# Patient Record
Sex: Female | Born: 1940 | Race: White | Hispanic: No | Smoking: Never smoker
Health system: Southern US, Community
[De-identification: ages and names within clinical notes are randomized; demographics above are authoritative.]

## PROBLEM LIST (undated history)

## (undated) DIAGNOSIS — N390 Urinary tract infection, site not specified: Secondary | ICD-10-CM

## (undated) DIAGNOSIS — E079 Disorder of thyroid, unspecified: Secondary | ICD-10-CM

## (undated) DIAGNOSIS — I10 Essential (primary) hypertension: Secondary | ICD-10-CM

## (undated) DIAGNOSIS — G4730 Sleep apnea, unspecified: Secondary | ICD-10-CM

## (undated) DIAGNOSIS — E119 Type 2 diabetes mellitus without complications: Secondary | ICD-10-CM

## (undated) DIAGNOSIS — K219 Gastro-esophageal reflux disease without esophagitis: Secondary | ICD-10-CM

## (undated) DIAGNOSIS — F329 Major depressive disorder, single episode, unspecified: Secondary | ICD-10-CM

## (undated) DIAGNOSIS — M1990 Unspecified osteoarthritis, unspecified site: Secondary | ICD-10-CM

## (undated) DIAGNOSIS — E785 Hyperlipidemia, unspecified: Secondary | ICD-10-CM

## (undated) DIAGNOSIS — B019 Varicella without complication: Secondary | ICD-10-CM

## (undated) DIAGNOSIS — F32A Depression, unspecified: Secondary | ICD-10-CM

## (undated) DIAGNOSIS — I639 Cerebral infarction, unspecified: Secondary | ICD-10-CM

## (undated) DIAGNOSIS — H269 Unspecified cataract: Secondary | ICD-10-CM

## (undated) DIAGNOSIS — G709 Myoneural disorder, unspecified: Secondary | ICD-10-CM

## (undated) DIAGNOSIS — H409 Unspecified glaucoma: Secondary | ICD-10-CM

## (undated) HISTORY — DX: Unspecified osteoarthritis, unspecified site: M19.90

## (undated) HISTORY — DX: Depression, unspecified: F32.A

## (undated) HISTORY — PX: BREAST SURGERY: SHX581

## (undated) HISTORY — DX: Gastro-esophageal reflux disease without esophagitis: K21.9

## (undated) HISTORY — DX: Urinary tract infection, site not specified: N39.0

## (undated) HISTORY — PX: APPENDECTOMY: SHX54

## (undated) HISTORY — DX: Myoneural disorder, unspecified: G70.9

## (undated) HISTORY — DX: Sleep apnea, unspecified: G47.30

## (undated) HISTORY — DX: Essential (primary) hypertension: I10

## (undated) HISTORY — DX: Varicella without complication: B01.9

## (undated) HISTORY — DX: Hyperlipidemia, unspecified: E78.5

## (undated) HISTORY — DX: Cerebral infarction, unspecified: I63.9

## (undated) HISTORY — DX: Type 2 diabetes mellitus without complications: E11.9

## (undated) HISTORY — DX: Major depressive disorder, single episode, unspecified: F32.9

## (undated) HISTORY — DX: Unspecified cataract: H26.9

## (undated) HISTORY — DX: Disorder of thyroid, unspecified: E07.9

## (undated) HISTORY — PX: ABDOMINAL HYSTERECTOMY: SHX81

## (undated) HISTORY — PX: TUBAL LIGATION: SHX77

## (undated) HISTORY — DX: Unspecified glaucoma: H40.9

---

## 1997-10-16 ENCOUNTER — Other Ambulatory Visit: Admission: RE | Admit: 1997-10-16 | Discharge: 1997-10-16 | Payer: Self-pay | Admitting: Internal Medicine

## 1998-12-29 ENCOUNTER — Encounter: Payer: Self-pay | Admitting: Gastroenterology

## 1998-12-29 ENCOUNTER — Ambulatory Visit (HOSPITAL_COMMUNITY): Admission: RE | Admit: 1998-12-29 | Discharge: 1998-12-29 | Payer: Self-pay | Admitting: Gastroenterology

## 1999-01-20 ENCOUNTER — Encounter: Payer: Self-pay | Admitting: Gastroenterology

## 1999-01-20 ENCOUNTER — Ambulatory Visit (HOSPITAL_COMMUNITY): Admission: RE | Admit: 1999-01-20 | Discharge: 1999-01-20 | Payer: Self-pay | Admitting: Gastroenterology

## 1999-05-18 ENCOUNTER — Encounter: Payer: Self-pay | Admitting: Internal Medicine

## 1999-05-18 ENCOUNTER — Ambulatory Visit (HOSPITAL_COMMUNITY): Admission: RE | Admit: 1999-05-18 | Discharge: 1999-05-18 | Payer: Self-pay | Admitting: Internal Medicine

## 2000-02-22 ENCOUNTER — Encounter (INDEPENDENT_AMBULATORY_CARE_PROVIDER_SITE_OTHER): Payer: Self-pay | Admitting: Specialist

## 2000-02-22 ENCOUNTER — Ambulatory Visit (HOSPITAL_BASED_OUTPATIENT_CLINIC_OR_DEPARTMENT_OTHER): Admission: RE | Admit: 2000-02-22 | Discharge: 2000-02-22 | Payer: Self-pay | Admitting: Otolaryngology

## 2000-07-18 ENCOUNTER — Ambulatory Visit (HOSPITAL_COMMUNITY): Admission: RE | Admit: 2000-07-18 | Discharge: 2000-07-18 | Payer: Self-pay | Admitting: Internal Medicine

## 2000-07-18 ENCOUNTER — Encounter: Payer: Self-pay | Admitting: Internal Medicine

## 2001-07-20 ENCOUNTER — Encounter: Payer: Self-pay | Admitting: Internal Medicine

## 2001-07-20 ENCOUNTER — Ambulatory Visit (HOSPITAL_COMMUNITY): Admission: RE | Admit: 2001-07-20 | Discharge: 2001-07-20 | Payer: Self-pay | Admitting: Internal Medicine

## 2004-06-03 ENCOUNTER — Other Ambulatory Visit: Admission: RE | Admit: 2004-06-03 | Discharge: 2004-06-03 | Payer: Self-pay | Admitting: Family Medicine

## 2007-04-17 ENCOUNTER — Encounter: Admission: RE | Admit: 2007-04-17 | Discharge: 2007-04-17 | Payer: Self-pay | Admitting: Family Medicine

## 2007-04-30 ENCOUNTER — Ambulatory Visit: Payer: Self-pay | Admitting: Gastroenterology

## 2007-05-01 ENCOUNTER — Encounter: Admission: RE | Admit: 2007-05-01 | Discharge: 2007-05-01 | Payer: Self-pay | Admitting: Anesthesiology

## 2007-05-07 ENCOUNTER — Encounter: Payer: Self-pay | Admitting: Gastroenterology

## 2007-05-07 ENCOUNTER — Ambulatory Visit: Payer: Self-pay | Admitting: Gastroenterology

## 2007-06-15 ENCOUNTER — Ambulatory Visit (HOSPITAL_COMMUNITY): Admission: RE | Admit: 2007-06-15 | Discharge: 2007-06-15 | Payer: Self-pay | Admitting: Neurological Surgery

## 2007-10-26 ENCOUNTER — Ambulatory Visit (HOSPITAL_COMMUNITY): Admission: RE | Admit: 2007-10-26 | Discharge: 2007-10-26 | Payer: Self-pay | Admitting: Neurological Surgery

## 2009-03-28 DIAGNOSIS — E039 Hypothyroidism, unspecified: Secondary | ICD-10-CM | POA: Insufficient documentation

## 2009-04-24 ENCOUNTER — Encounter (INDEPENDENT_AMBULATORY_CARE_PROVIDER_SITE_OTHER): Payer: Self-pay | Admitting: *Deleted

## 2009-12-07 ENCOUNTER — Telehealth: Payer: Self-pay | Admitting: Gastroenterology

## 2010-04-27 NOTE — Letter (Signed)
Summary: Colonoscopy Letter  [facility]  [address] [place], [place] [address]   Phone: [phone]  Fax: [phone]      April 24, 2009 MRN: [id number]   SAFIRE GORDIN [address] [place], [place]  [address]   Dear [name],   According to your medical record, it is time for you to schedule a Colonoscopy. The American Cancer Society recommends this procedure as a method to detect early colon cancer. Patients with a family history of colon cancer, or a personal history of colon polyps or inflammatory bowel disease are at increased risk.  This letter has beeen generated based on the recommendations made at the time of your procedure. If you feel that in your particular situation this may no longer apply, please contact our office.  Please call our office at [phone] to schedule this appointment or to update your records at your earliest convenience.  Thank you for cooperating with [place] to provide you with the very best care possible.   Sincerely,  Barbette Hair. Arlyce Dice, M.D.  [facility] Gastroenterology Division [phone]

## 2010-04-27 NOTE — Progress Notes (Signed)
Summary: Schedule Colonoscopy   Phone Note Outgoing Call Call back at [facility] Phone [phone]   Call placed by: Harlow Mares CMA [facility]),  December 07, 2009 9:54 AM Call placed to: Patient Summary of Call: Patients number is disconnected, we will mail them a letter to remind them they are due for their procedure and they need to call back and schedule.   Initial call taken by: Harlow Mares CMA [facility]),  December 07, 2009 9:54 AM

## 2010-08-10 NOTE — Op Note (Signed)
NAME:  Alexa Brown, Alexa Brown NO.:  [id number]   MEDICAL RECORD NO.:  [id number]          PATIENT TYPE:  AMB   LOCATION:  SDS                          FACILITY:  [facility]   PHYSICIAN:  Tia Alert, MD     DATE OF BIRTH:  Jul 21, 1940   DATE OF PROCEDURE:  06/15/2007  DATE OF DISCHARGE:                               OPERATIVE REPORT   PREOPERATIVE DIAGNOSIS:  Right carpal tunnel syndrome.   POSTOPERATIVE DIAGNOSIS:  Right carpal tunnel syndrome.   PROCEDURE:  Right carpal tunnel release.   SURGEON:  Marikay Alar, M.D.   ANESTHESIA:  Local MAC.   COMPLICATIONS:  None apparent.   INDICATIONS FOR PROCEDURE:  Ms. Davern is a 70 year old white  female who presented with numbness and tingling in the hands in a median  nerve distribution.  She claims some weakness in the hand.  She had  nerve conduction studies suggestive of severe bilateral median  neuropathies, recommended bilateral carpal tunnel release in a staged  fashion.  She presents today for the right-sided decompression followed  in several weeks by a left-sided decompression.  She understands the  risks, benefits, and expected outcome and wishes to proceed.   DESCRIPTION OF PROCEDURE:  The patient was taken to the operating room  and placed in the supine position with the right arm extended on an arm  board.  It was prepped circumferentially from the fingertips to the  axilla with DuraPrep and then draped in the usual sterile fashion.  9 mL  of local anesthesia was injected and a small palmar incision was made  from the distal wrist crease into the palm in line with the middle  finger.  I dissected down through the palmar fat and fascia to identify  the transverse carpal ligament.  I dissected through this with a 15  blade scalpel until the underlying median nerve was identified.  I then  spread between the transverse carpal ligament and the median nerve with  a mosquito and transected the transverse  carpal ligament proximally  under the distal wrist crease into the wrist until it was completely  transected proximally in the arm.  I then palpated with the mosquito to  assure adequate decompression and inspected the nerve.  The nerve looked  healthy and free.  I then again dissected between the nerve and the  transverse carpal ligament distally into the hand and transected the  transverse carpal ligament distally into the palm until it was  completely transected.  The underlying median nerve was now free and the  palmar fat was identified.  I then palpated both proximally and distally  with a mosquito to assure adequate decompression of the median nerve.  I  inspected the nerve.  It looked healthy and free.  I then irrigated with  saline solution, dried all bleeding points.  I then closed the  subcutaneous tissues with interrupted 3-0 Vicryl and then  closed the skin with interrupted 4-0 Ethilon vertical mattress sutures.  The hand was then wrapped in a Kerlix and Ace bandage.  The patient was  taken to recovery room in stable condition.  At the  end of the procedure  all sponge, needle and instrument counts were correct.      Tia Alert, MD  Electronically Signed     DSJ/MEDQ  D:  06/15/2007  T:  06/15/2007  Job:  [id number]

## 2010-08-10 NOTE — Letter (Signed)
April 30, 2007     RE:  NELIAH, CUYLER  MRN:  [id number]  /  DOB:  1941/03/04   Dear [name]:   It is my pleasure to have treated you recently as a new patient in my  office.  I appreciate your confidence and the opportunity to participate  in your care.   Since I do have a busy inpatient endoscopy schedule and office schedule,  my office hours vary weekly.  I am, however, available for emergency  calls every day through my office.  If I cannot promptly meet an urgent  office appointment, another one of our gastroenterologists will be able  to assist you.   My well-trained staff are prepared to help you at all times.  For  emergencies after office hours, a physician from our gastroenterology  section is always available through my 24-hour answering service.   While you are under my care, I encourage discussion of your questions  and concerns, and I will be happy to return your calls as soon as I am  available.   Once again, I welcome you as a new patient and I look forward to a happy  and healthy relationship.    Sincerely,      Barbette Hair. Arlyce Dice, MD,FACG  Electronically Signed    RDK/MedQ  DD: 04/30/2007  DT: 04/30/2007  Job #: [id number]

## 2010-08-10 NOTE — Op Note (Signed)
NAME:  Alexa Brown, Alexa Brown        ACCOUNT NO.:  [id number]   MEDICAL RECORD NO.:  [id number]          PATIENT TYPE:  AMB   LOCATION:  SDS                          FACILITY:  [facility]   PHYSICIAN:  Tia Alert, MD     DATE OF BIRTH:  12/18/1940   DATE OF PROCEDURE:  10/26/2007  DATE OF DISCHARGE:  10/26/2007                               OPERATIVE REPORT   PREOPERATIVE DIAGNOSIS:  Left carpal tunnel syndrome.   POSTOPERATIVE DIAGNOSIS:  Left carpal tunnel syndrome.   PROCEDURE:  Left carpal tunnel release.   SURGEON:  Tia Alert, MD.   ANESTHESIA:  Local MAC.   COMPLICATIONS:  None apparent.   INDICATIONS FOR PROCEDURE:  Ms. [name] is a 70 year old female who had  undergone a right carpal tunnel release about 4 months ago.  She  presented for left carpal tunnel release today in the hopes of improving  her symptoms of median neuropathy confirmed with nerve conduction  studies.  She understood the risks, benefits, expected outcome, and  wished to proceed.   DESCRIPTION OF PROCEDURE:  The patient was taken to operating room and  placed in supine position with a left arm extended on an arm board.  It  was prepped circumferentially with DuraPrep from the axilla down to the  fingertips.  Local anesthesia, 7 mL, was injected and a small palmar  incision was made just distal to the distal wrist crease into the palm  in line with the middle finger.  I dissected down through the palmar  fascia, identified the transverse carpal ligament, opened this with a 15-  blade scalpel to expose the underlying median nerve and then spread  between the transverse carpal ligament and the median nerve with a  hemostat and completely transected the transverse carpal ligament  proximally into the arm under the wrist crease until it was completely  transected.  I then palpated with a curved hemostat and inspected the  nerve, it was well decompressed.  I did the same thing distally into the  palm  spreading between the ligament and the nerve transecting the  ligament distally into the palm until the palmar fat was identified and  the transverse carpal ligament was completely transected.  I then  irrigated with saline solution, inspected the nerve, it was free, and  then I closed the palmar fascia with a single 3-0 Vicryl suture, closed  the subcuticular tissue with 0 Vicryl suture and closed the skin with  interrupted 4-0 Ethilon vertical mattress sutures.  The hand was then  wrapped in a Kerlix and Ace bandage and the patient was taken to  recovery room in stable condition.  At the end of procedure, all sponge,  needle, and instrument counts were correct.      Tia Alert, MD  Electronically Signed    DSJ/MEDQ  D:  10/26/2007  T:  10/27/2007  Job:  [id number]

## 2010-08-10 NOTE — Letter (Signed)
April 30, 2007    Holley Bouche, M.D.  [address]  [place], [place] [address]   RE:  JOSIE, BURLEIGH  MRN:  [id number]  /  DOB:  [birth year]   Dear Dr. Tiburcio Pea:   Upon your kind referral, I had the pleasure of evaluating your patient  and I am pleased to offer my findings.  I saw [name] in the  office today.  Enclosed is a copy of my progress note that details my  findings and recommendations.   Thank you for the opportunity to participate in your patient's care.    Sincerely,      Barbette Hair. Arlyce Dice, MD,FACG  Electronically Signed    RDK/MedQ  DD: 04/30/2007  DT: 04/30/2007  Job #: [id number]

## 2010-08-10 NOTE — Assessment & Plan Note (Signed)
[facility]                         GASTROENTEROLOGY OFFICE NOTE   Alexa Brown, Alexa Brown                 MRN:          [id number]  DATE:04/30/2007                            DOB:          12-22-1940    REASON FOR CONSULTATION:  Colorectal cancer screening.   Ms. Alexa Brown is a pleasant 70 year old white female referred  through the courtesy of Dr. Tiburcio Pea for evaluation.  Her main GI  complaint is some leakage following a bowel movement.  She is without  rectal or abdominal pain.  There is no history of melena or  hematochezia.  Bowels tend to be erratic, characterized by both episodes  of constipation and diarrhea.  She has occasional pyrosis which is well  controlled with Nexium.   PAST MEDICAL HISTORY:  1. Pertinent for hypertension.  2. She had a CVA in 1994.  3. She is under treatment for depression.  4. She is status post hysterectomy.   FAMILY HISTORY:  Pertinent for a father with pancreatic cancer.   MEDICATIONS:  Lotensin, Plavix, Lipitor, Nexium, Zoloft, Wellbutrin,  Allegra and lysine.   She has no allergies.  She neither smokes or drinks.  She is married and  works as a Copywriter, advertising.   REVIEW OF SYSTEMS:  Is positive for fatigue.   PHYSICAL EXAMINATION:  Pulse 78, blood pressure 110/66, weight 176.  HEENT:  EOMI.  PERRLA.  Sclerae are anicteric.  Conjunctivae are pink.  NECK:  Supple without thyromegaly, adenopathy or carotid bruits.  CHEST:  Clear to auscultation and percussion without adventitious  sounds.  CARDIAC:  Regular rhythm; normal S1 S2.  There are no murmurs, gallops  or rubs.  ABDOMEN:  Bowel sounds are normoactive.  Abdomen is soft, nontender and  nondistended.  There are no abdominal masses, tenderness, splenic  enlargement or hepatomegaly.  EXTREMITIES:  Full range of motion.  No cyanosis, clubbing or edema.  RECTAL:  Stool is Hemoccult negative.   IMPRESSION:  1. Incontinence.  This could be due  to hemorrhoids versus relations to      loose stools and irritable bowel syndrome.  2. Status post cerebrovascular accident - on Plavix.  3. Gastroesophageal reflux disease.   RECOMMENDATION:  1. Colonoscopy.  I will hold her Plavix for 7 days prior to this.  2. Fiber supplementation.     Barbette Hair. Arlyce Dice, MD,FACG  Electronically Signed    RDK/MedQ  DD: 04/30/2007  DT: 04/30/2007  Job #: [id number]   cc:   Holley Bouche, M.D.

## 2010-08-13 NOTE — Op Note (Signed)
[place]  Patient:    Alexa Brown, Alexa Brown                 MRN: [id number] Proc. Date: 02/22/00 Adm. Date:  [id number] Attending:  Susy Frizzle CC:         Merlene Laughter. Renae Gloss, M.D.   Operative Report  PREOPERATIVE DIAGNOSES:  Chronic ethmoid sinusitis, chronic maxillary sinusitis, chronic frontal sinusitis.  POSTOPERATIVE DIAGNOSES:  Chronic ethmoid sinusitis, chronic maxillary sinusitis, chronic frontal sinusitis.  PROCEDURES: 1. Bilateral endoscopic anterior ethmoidectomy. 2. Bilateral endoscopic maxillary antrostomy. 3. Bilateral endoscopic frontal sinusotomy.  SURGEON:  Jefry H. Pollyann Kennedy, M.D.  ANESTHESIA:  General endotracheal anesthesia.  COMPLICATIONS:  None.  ESTIMATED BLOOD LOSS:  100 cc.  FINDINGS:  Diffuse polypoid disease throughout the anterior ethmoid cells bilaterally and the frontal recess area bilaterally.   Severe narrowing of the infundibular areas bilaterally secondary to mucosal congestion and anatomic constrictions on the drainage pathways.  Thick, inspissated mucus material filling the right maxillary sinus.  REFERRING PHYSICIAN:  Merlene Laughter. Renae Gloss, M.D.  The patient tolerated the procedure well, was awakened, extubated and transferred to recovery in stable condition.  HISTORY:  This is a 70 year old lady with a recent history of chronic sinus infections.  She has been treated on multiple occasions with longstanding antibiotics for the past several months, which have not completely cleared her.  She continues to have acute exacerbations as well.  Risks, benefits, alternatives and complications of the procedure were explained to patient, who seemed to understand and agreed to surgery.  PROCEDURE:  The patient was taken to the operating room, placed on the operating table in supine position.  Following induction of general endotracheal anesthesia, the patient was draped in a standard fashion for sinus  surgery.  Oxymetazoline spray was used in the nose first sprayed and then on pledgets.  Xylocaine 1% with epinephrine was infiltrated into the superior and posterior attachments of the middle turbinates and the lateral nasal wall area.  1. Bilateral endoscopic ethmoidectomy.  The uncinate process was taken down using sickle knife.  This exposed nicely the bulla ethmoidalis and the infundibular area.  The bulla was taken down, as were the anterior cells.  A complete anterior ethmoidectomy was performed using the microdebrider.  The lateral limit was the lamina papyracea, which was kept intact bilaterally. The medial limit was the ground lamella of the middle turbinate, which was also kept intact and stable.  Posterior limit was the ground lamella as well. The same procedure was performed bilaterally.  There were multiple sinus cells that were filled with polypoid tissue and thick inspissated mucus.  2. Bilateral maxillary antrostomy.  The natural ostium of the maxillary sinus was identified bilaterally.  There was a large fontanelle bilaterally that was opened using the curved suction and microdebrider.  On the right side, a large amount of thick, inspissated mucus was aspirated and sent for culture.  The left side was relatively clear.  The natural ostium was enlarged anteriorly to the lacrimal bone, inferiorly to the attachment of the inferior turbinate and posteriorly to the confluence with the ethmoid lamina papyracea.  Large openings were created bilaterally.  3. Bilateral endoscopic frontal sinusotomy.  After having completed the ethmoidectomy, the agger nasi cells, which were taken down as well directed into the frontal sinus drainage pathway.  There were polypoid mucosa on both sides that were taken down with ______ forceps.  Continued dissection up anteriorly into the frontal sinus was accomplished bilaterally and  tissue was removed from the inferior aspect of the frontal sinuses  bilaterally.  The sinus cavities were irrigated with saline solution and Afrin-soaked pledgets were used to pack the ethmoid cavities temporally bilaterally.  The packs were then removed in the recovery room.  Visual checks were done repeated during the procedure.  There was no evidence of any pupillary defect or any increased intraocular pressure.  The patient tolerated the procedure, was transferred to recovery in stable condition after awaking from anesthesia. DD:  02/22/00 TD:  02/22/00 Job: [id number]

## 2010-09-08 LAB — BASIC METABOLIC PANEL
BUN: 18 mg/dL (ref 4–21)
CREATININE: 1.1 mg/dL (ref 0.5–1.1)
GLUCOSE: 111 mg/dL
POTASSIUM: 4.1 mmol/L (ref 3.4–5.3)
Sodium: 142 mmol/L (ref 137–147)

## 2010-09-08 LAB — HEPATIC FUNCTION PANEL
ALT: 39 U/L — AB (ref 7–35)
AST: 27 U/L (ref 13–35)
Alkaline Phosphatase: 68 U/L (ref 25–125)
Bilirubin, Total: 0.3 mg/dL

## 2010-09-08 LAB — TSH: TSH: 1.76 u[IU]/mL (ref 0.41–5.90)

## 2010-09-08 LAB — HEMOGLOBIN A1C: Hemoglobin A1C: 6.5

## 2010-12-20 LAB — BASIC METABOLIC PANEL
Chloride: 104
Creatinine, Ser: 1.07
GFR calc Af Amer: >60
GFR calc non Af Amer: 52 — ABNORMAL LOW
Potassium: 4.5

## 2010-12-20 LAB — APTT: aPTT: 28

## 2010-12-20 LAB — DIFFERENTIAL
Eosinophils Absolute: 0.1
Lymphocytes Relative: 23
Lymphs Abs: 1.4
Monocytes Relative: 9
Neutrophils Relative %: 66

## 2010-12-20 LAB — CBC
HCT: 42.6
MCV: 84.9
RBC: 5.01
WBC: 5.9

## 2010-12-20 LAB — PROTIME-INR: INR: 0.8

## 2010-12-24 LAB — DIFFERENTIAL
Eosinophils Relative: 3
Lymphocytes Relative: 27
Lymphs Abs: 1.6
Monocytes Absolute: 0.5
Monocytes Relative: 8
Neutro Abs: 3.7

## 2010-12-24 LAB — CBC
HCT: 38.6
Hemoglobin: 13.4
RBC: 4.44
WBC: 6.1

## 2010-12-24 LAB — APTT: aPTT: 26

## 2010-12-24 LAB — BASIC METABOLIC PANEL
Calcium: 9.7
GFR calc Af Amer: >60
GFR calc non Af Amer: >60
Glucose, Bld: 171 — ABNORMAL HIGH
Potassium: 3.6
Sodium: 138

## 2011-03-04 LAB — CBC AND DIFFERENTIAL
HCT: 40 % (ref 36–46)
HEMOGLOBIN: 13.3 g/dL (ref 12.0–16.0)
Platelets: 256 10*3/uL (ref 150–399)
WBC: 5.6 10^3/mL

## 2011-03-04 LAB — LIPID PANEL
CHOLESTEROL: 207 mg/dL — AB (ref 0–200)
HDL: 62 mg/dL (ref 35–70)
LDL Cholesterol: 111 mg/dL
Triglycerides: 170 mg/dL — AB (ref 40–160)

## 2011-03-04 LAB — BASIC METABOLIC PANEL
BUN: 22 mg/dL — AB (ref 4–21)
Creatinine: 1 mg/dL (ref 0.5–1.1)
Glucose: 91 mg/dL
POTASSIUM: 4.2 mmol/L (ref 3.4–5.3)
SODIUM: 139 mmol/L (ref 137–147)

## 2011-03-04 LAB — HEMOGLOBIN A1C: Hemoglobin A1C: 6.2

## 2011-03-04 LAB — HEPATIC FUNCTION PANEL
ALT: 19 U/L (ref 7–35)
AST: 17 U/L (ref 13–35)
Alkaline Phosphatase: 56 U/L (ref 25–125)
Bilirubin, Total: 0.3 mg/dL

## 2011-03-04 LAB — TSH: TSH: 1.61 u[IU]/mL (ref 0.41–5.90)

## 2011-06-27 ENCOUNTER — Encounter: Payer: Self-pay | Admitting: Gastroenterology

## 2011-07-05 LAB — HEMOGLOBIN A1C: Hemoglobin A1C: 6.2

## 2011-08-12 LAB — HEPATIC FUNCTION PANEL
ALT: 20 U/L (ref 7–35)
AST: 19 U/L (ref 13–35)
Alkaline Phosphatase: 54 U/L (ref 25–125)
Bilirubin, Total: 0.4 mg/dL

## 2011-08-12 LAB — BASIC METABOLIC PANEL
BUN: 20 mg/dL (ref 4–21)
Creatinine: 1.1 mg/dL (ref 0.5–1.1)
Glucose: 99 mg/dL
POTASSIUM: 4.4 mmol/L (ref 3.4–5.3)
SODIUM: 142 mmol/L (ref 137–147)

## 2011-08-12 LAB — CBC AND DIFFERENTIAL
HCT: 37 % (ref 36–46)
HEMOGLOBIN: 12.6 g/dL (ref 12.0–16.0)
Platelets: 232 10*3/uL (ref 150–399)
WBC: 4.6 10^3/mL

## 2011-12-13 ENCOUNTER — Encounter: Payer: Self-pay | Admitting: Gastroenterology

## 2012-03-28 HISTORY — PX: BREAST BIOPSY: SHX20

## 2012-05-11 LAB — BASIC METABOLIC PANEL
BUN: 28 mg/dL — AB (ref 4–21)
CREATININE: 1.1 mg/dL (ref 0.5–1.1)
GLUCOSE: 115 mg/dL
POTASSIUM: 4.3 mmol/L (ref 3.4–5.3)
Sodium: 140 mmol/L (ref 137–147)

## 2012-05-11 LAB — HEPATIC FUNCTION PANEL
ALT: 21 U/L (ref 7–35)
AST: 19 U/L (ref 13–35)
Alkaline Phosphatase: 44 U/L (ref 25–125)
BILIRUBIN, TOTAL: 0.5 mg/dL

## 2012-05-11 LAB — LIPID PANEL
Cholesterol: 202 mg/dL — AB (ref 0–200)
HDL: 68 mg/dL (ref 35–70)
LDL Cholesterol: 102 mg/dL
Triglycerides: 160 mg/dL (ref 40–160)

## 2012-05-11 LAB — HEMOGLOBIN A1C: HEMOGLOBIN A1C: 6.1

## 2012-08-02 LAB — BASIC METABOLIC PANEL
BUN: 24 mg/dL — AB (ref 4–21)
CREATININE: 1.3 mg/dL — AB (ref 0.5–1.1)
GLUCOSE: 81 mg/dL
Potassium: 4.1 mmol/L (ref 3.4–5.3)
Sodium: 141 mmol/L (ref 137–147)

## 2012-08-02 LAB — HEPATIC FUNCTION PANEL
ALK PHOS: 47 U/L (ref 25–125)
ALT: 18 U/L (ref 7–35)
AST: 18 U/L (ref 13–35)
Bilirubin, Total: 0.5 mg/dL

## 2012-08-02 LAB — TSH: TSH: 2.71 u[IU]/mL (ref 0.41–5.90)

## 2012-09-03 LAB — BASIC METABOLIC PANEL
BUN: 19 mg/dL (ref 4–21)
Creatinine: 1.2 mg/dL — AB (ref 0.5–1.1)
Glucose: 94 mg/dL
Potassium: 4.4 mmol/L (ref 3.4–5.3)
Sodium: 142 mmol/L (ref 137–147)

## 2012-09-03 LAB — HEPATIC FUNCTION PANEL
ALK PHOS: 60 U/L (ref 25–125)
ALT: 25 U/L (ref 7–35)
AST: 21 U/L (ref 13–35)
BILIRUBIN, TOTAL: 0.5 mg/dL

## 2012-09-03 LAB — CBC AND DIFFERENTIAL
HEMATOCRIT: 40 % (ref 36–46)
HEMOGLOBIN: 14.1 g/dL (ref 12.0–16.0)
PLATELETS: 259 10*3/uL (ref 150–399)
WBC: 5.3 10^3/mL

## 2012-09-03 LAB — HEMOGLOBIN A1C: HEMOGLOBIN A1C: 6

## 2012-11-19 LAB — BASIC METABOLIC PANEL
BUN: 17 mg/dL (ref 4–21)
CREATININE: 1.3 mg/dL — AB (ref 0.5–1.1)
GLUCOSE: 135 mg/dL
POTASSIUM: 4.5 mmol/L (ref 3.4–5.3)
Sodium: 141 mmol/L (ref 137–147)

## 2012-11-19 LAB — HEPATIC FUNCTION PANEL
ALT: 23 U/L (ref 7–35)
AST: 21 U/L (ref 13–35)
Alkaline Phosphatase: 59 U/L (ref 25–125)
Bilirubin, Total: 0.4 mg/dL

## 2013-01-28 DIAGNOSIS — L719 Rosacea, unspecified: Secondary | ICD-10-CM | POA: Insufficient documentation

## 2013-06-11 LAB — TSH: TSH: 1.27 u[IU]/mL (ref 0.41–5.90)

## 2013-06-11 LAB — LIPID PANEL
CHOLESTEROL: 181 mg/dL (ref 0–200)
HDL: 79 mg/dL — AB (ref 35–70)
LDL Cholesterol: 70 mg/dL
Triglycerides: 160 mg/dL (ref 40–160)

## 2013-06-11 LAB — BASIC METABOLIC PANEL
BUN: 20 mg/dL (ref 4–21)
Creatinine: 1.3 mg/dL — AB (ref 0.5–1.1)
Glucose: 124 mg/dL
POTASSIUM: 4.2 mmol/L (ref 3.4–5.3)
SODIUM: 141 mmol/L (ref 137–147)

## 2013-06-11 LAB — HEPATIC FUNCTION PANEL
ALT: 30 U/L (ref 7–35)
AST: 27 U/L (ref 13–35)
Alkaline Phosphatase: 74 U/L (ref 25–125)
Bilirubin, Total: 0.7 mg/dL

## 2013-06-11 LAB — HEMOGLOBIN A1C: HEMOGLOBIN A1C: 7.3

## 2013-06-18 LAB — BASIC METABOLIC PANEL
BUN: 26 mg/dL — AB (ref 4–21)
Creatinine: 1.2 mg/dL — AB (ref 0.5–1.1)
Glucose: 174 mg/dL
Potassium: 4.4 mmol/L (ref 3.4–5.3)
SODIUM: 139 mmol/L (ref 137–147)

## 2013-08-26 LAB — BASIC METABOLIC PANEL
BUN: 17 mg/dL (ref 4–21)
CREATININE: 1.2 mg/dL — AB (ref 0.5–1.1)
GLUCOSE: 105 mg/dL
Potassium: 4.5 mmol/L (ref 3.4–5.3)
Sodium: 143 mmol/L (ref 137–147)

## 2013-08-26 LAB — LIPID PANEL
Cholesterol: 138 mg/dL (ref 0–200)
HDL: 61 mg/dL (ref 35–70)
LDL Cholesterol: 62 mg/dL
TRIGLYCERIDES: 76 mg/dL (ref 40–160)

## 2013-08-26 LAB — HEPATIC FUNCTION PANEL
ALK PHOS: 62 U/L (ref 25–125)
ALT: 29 U/L (ref 7–35)
AST: 24 U/L (ref 13–35)
BILIRUBIN, TOTAL: 0.5 mg/dL

## 2013-08-26 LAB — CBC AND DIFFERENTIAL
HCT: 39 % (ref 36–46)
Hemoglobin: 13.2 g/dL (ref 12.0–16.0)
PLATELETS: 234 10*3/uL (ref 150–399)
WBC: 4 10*3/mL

## 2013-08-26 LAB — TSH: TSH: 1.01 u[IU]/mL (ref 0.41–5.90)

## 2013-08-26 LAB — HEMOGLOBIN A1C: HEMOGLOBIN A1C: 6.4

## 2014-06-11 LAB — BASIC METABOLIC PANEL
BUN: 19 mg/dL (ref 4–21)
Creatinine: 1.1 mg/dL (ref 0.5–1.1)
Glucose: 109 mg/dL
POTASSIUM: 4.3 mmol/L (ref 3.4–5.3)
SODIUM: 141 mmol/L (ref 137–147)

## 2014-06-11 LAB — HEPATIC FUNCTION PANEL
ALK PHOS: 57 U/L (ref 25–125)
ALT: 25 U/L (ref 7–35)
AST: 20 U/L (ref 13–35)
BILIRUBIN, TOTAL: 0.6 mg/dL

## 2014-06-11 LAB — HEMOGLOBIN A1C: Hemoglobin A1C: 6.2

## 2014-10-29 LAB — BASIC METABOLIC PANEL
BUN: 20 mg/dL (ref 4–21)
Creatinine: 1.1 mg/dL (ref 0.5–1.1)
GLUCOSE: 96 mg/dL
Potassium: 4.6 mmol/L (ref 3.4–5.3)
Sodium: 141 mmol/L (ref 137–147)

## 2014-10-29 LAB — HEPATIC FUNCTION PANEL
ALK PHOS: 53 U/L (ref 25–125)
ALT: 23 U/L (ref 7–35)
AST: 20 U/L (ref 13–35)
BILIRUBIN, TOTAL: 0.5 mg/dL

## 2014-10-29 LAB — LIPID PANEL
Cholesterol: 195 mg/dL (ref 0–200)
HDL: 64 mg/dL (ref 35–70)
LDL CALC: 93 mg/dL
TRIGLYCERIDES: 188 mg/dL — AB (ref 40–160)

## 2014-10-29 LAB — HEMOGLOBIN A1C: Hemoglobin A1C: 6.1

## 2015-01-01 LAB — BASIC METABOLIC PANEL
BUN: 25 mg/dL — AB (ref 4–21)
Creatinine: 1 mg/dL (ref 0.5–1.1)
GLUCOSE: 94 mg/dL
Potassium: 3.9 mmol/L (ref 3.4–5.3)
Sodium: 138 mmol/L (ref 137–147)

## 2015-01-01 LAB — TSH: TSH: 1.25 u[IU]/mL (ref 0.41–5.90)

## 2015-01-01 LAB — HEPATIC FUNCTION PANEL
ALT: 24 U/L (ref 7–35)
AST: 23 U/L (ref 13–35)
Alkaline Phosphatase: 53 U/L (ref 25–125)
BILIRUBIN, TOTAL: 0.7 mg/dL

## 2015-01-01 LAB — CBC AND DIFFERENTIAL
HEMATOCRIT: 45 % (ref 36–46)
HEMOGLOBIN: 14.7 g/dL (ref 12.0–16.0)
Platelets: 254 10*3/uL (ref 150–399)
WBC: 12 10*3/mL

## 2015-03-27 LAB — HEPATIC FUNCTION PANEL
ALT: 23 U/L (ref 7–35)
AST: 19 U/L (ref 13–35)
Alkaline Phosphatase: 64 U/L (ref 25–125)
Bilirubin, Total: 0.4 mg/dL

## 2015-03-27 LAB — HEMOGLOBIN A1C: Hemoglobin A1C: 5.9

## 2015-03-27 LAB — BASIC METABOLIC PANEL
BUN: 15 mg/dL (ref 4–21)
CREATININE: 1.1 mg/dL (ref 0.5–1.1)
GLUCOSE: 145 mg/dL
POTASSIUM: 4.2 mmol/L (ref 3.4–5.3)
SODIUM: 142 mmol/L (ref 137–147)

## 2015-04-28 ENCOUNTER — Encounter: Payer: Self-pay | Admitting: Gastroenterology

## 2015-07-27 LAB — CBC AND DIFFERENTIAL
HEMATOCRIT: 43 % (ref 36–46)
Hemoglobin: 14.2 g/dL (ref 12.0–16.0)
Platelets: 235 10*3/uL (ref 150–399)
WBC: 5.2 10^3/mL

## 2015-07-27 LAB — HEPATIC FUNCTION PANEL
ALK PHOS: 51 U/L (ref 25–125)
ALT: 20 U/L (ref 7–35)
AST: 21 U/L (ref 13–35)
BILIRUBIN, TOTAL: 0.5 mg/dL

## 2015-07-27 LAB — TSH: TSH: 1.81 u[IU]/mL (ref 0.41–5.90)

## 2015-07-27 LAB — BASIC METABOLIC PANEL
BUN: 19 mg/dL (ref 4–21)
CREATININE: 1.1 mg/dL (ref 0.5–1.1)
GLUCOSE: 157 mg/dL
Potassium: 3.8 mmol/L (ref 3.4–5.3)
SODIUM: 140 mmol/L (ref 137–147)

## 2015-07-27 LAB — HEMOGLOBIN A1C: Hemoglobin A1C: 5.7

## 2015-12-23 ENCOUNTER — Encounter (HOSPITAL_COMMUNITY): Payer: Self-pay

## 2015-12-23 DIAGNOSIS — S5002XA Contusion of left elbow, initial encounter: Secondary | ICD-10-CM | POA: Insufficient documentation

## 2015-12-23 DIAGNOSIS — S51812A Laceration without foreign body of left forearm, initial encounter: Secondary | ICD-10-CM | POA: Insufficient documentation

## 2015-12-23 DIAGNOSIS — Z23 Encounter for immunization: Secondary | ICD-10-CM | POA: Diagnosis not present

## 2015-12-23 DIAGNOSIS — Y92000 Kitchen of unspecified non-institutional (private) residence as  the place of occurrence of the external cause: Secondary | ICD-10-CM | POA: Insufficient documentation

## 2015-12-23 DIAGNOSIS — W1809XA Striking against other object with subsequent fall, initial encounter: Secondary | ICD-10-CM | POA: Insufficient documentation

## 2015-12-23 DIAGNOSIS — S0181XA Laceration without foreign body of other part of head, initial encounter: Secondary | ICD-10-CM | POA: Insufficient documentation

## 2015-12-23 DIAGNOSIS — Y9301 Activity, walking, marching and hiking: Secondary | ICD-10-CM | POA: Diagnosis not present

## 2015-12-23 DIAGNOSIS — Y999 Unspecified external cause status: Secondary | ICD-10-CM | POA: Insufficient documentation
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
## 2015-12-24 ENCOUNTER — Emergency Department (HOSPITAL_COMMUNITY)
Admission: EM | Admit: 2015-12-24 | Discharge: 2015-12-24 | Disposition: A | Discharge status: Home / Self Care | Payer: Medicare Other | Attending: Emergency Medicine | Admitting: Emergency Medicine

## 2015-12-24 DIAGNOSIS — S51812A Laceration without foreign body of left forearm, initial encounter: Secondary | ICD-10-CM | POA: Diagnosis not present

## 2015-12-24 DIAGNOSIS — IMO0002 Reserved for concepts with insufficient information to code with codable children: Secondary | ICD-10-CM

## 2015-12-24 MED ORDER — LIDOCAINE-EPINEPHRINE 2 %-1:200000 IJ SOLN
20.0000 mL | Freq: Once | INTRAMUSCULAR | Status: AC
Start: 2015-12-24 — End: 2015-12-24
  Administered 2015-12-24: 20 mL
  Filled 2015-12-24: qty 20

## 2015-12-24 MED ORDER — TETANUS-DIPHTH-ACELL PERTUSSIS 5-2.5-18.5 LF-MCG/0.5 IM SUSP
0.5000 mL | Freq: Once | INTRAMUSCULAR | Status: AC
  Administered 2015-12-24: 0.5 mL via INTRAMUSCULAR
  Filled 2015-12-24: qty 0.5
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
## 2015-12-24 NOTE — ED Notes (Signed)
PT DISCHARGED. INSTRUCTIONS GIVEN. AAOX4. PT IN NO APPARENT DISTRESS OR PAIN. THE OPPORTUNITY TO ASK QUESTIONS WAS PROVIDED. 

## 2015-12-24 NOTE — ED Provider Notes (Signed)
Pt under Dr. Plunkett's care, she asked that I suture the lac on her L forearm and dermabond the small linear chin lac. Please see Dr. Plunkett's full note for details of visit and full HPI/ROS/PE.  Physical Exam  BP 127/73   Pulse 98   Temp 98.3 F (36.8 C)   Resp 16   SpO2 91%   Physical Exam Chin with ~1cm linear lac, no ongoing bleeding L forearm with ~6cm linear lac gaped open along ulnar aspect of forearm, no ongoing bleeding, no visualized FBs, mild swelling and bruising to the area. NVI with soft compartments  ED Course  .Marland KitchenLaceration Repair Date/Time: 12/24/2015 12:55 AM Performed by: Zacarias Pontes Authorized by: Zacarias Pontes   Consent:    Consent obtained:  Verbal   Consent given by:  Patient   Risks discussed:  Infection, pain and poor cosmetic result   Alternatives discussed:  No treatment, delayed treatment and observation Anesthesia (see MAR for exact dosages):    Anesthesia method:  Local infiltration   Local anesthetic:  Lidocaine 2% WITH epi Laceration details:    Location:  Shoulder/arm   Shoulder/arm location:  L lower arm   Length (cm):  6 Repair type:    Repair type:  Intermediate Pre-procedure details:    Preparation:  Patient was prepped and draped in usual sterile fashion Exploration:    Hemostasis achieved with:  Epinephrine   Wound exploration: wound explored through full range of motion and entire depth of wound probed and visualized     Contaminated: no   Treatment:    Area cleansed with:  Saline   Amount of cleaning:  Standard   Irrigation solution:  Sterile saline Skin repair:    Repair method:  Sutures   Suture size:  4-0   Suture material:  Prolene   Suture technique:  Horizontal mattress   Number of sutures:  4 (2 horizontal mattress, and 2 simple interrupted) Approximation:    Approximation:  Close   Vermilion border: well-aligned   Post-procedure details:    Dressing:  Antibiotic ointment and non-adherent  dressing   Patient tolerance of procedure:  Tolerated well, no immediate complications .Marland KitchenLaceration Repair Date/Time: 12/24/2015 1:08 AM Performed by: Zacarias Pontes Authorized by: Zacarias Pontes   Consent:    Consent obtained:  Verbal   Consent given by:  Patient   Risks discussed:  Pain, infection and poor cosmetic result   Alternatives discussed:  No treatment, delayed treatment and observation Anesthesia (see MAR for exact dosages):    Anesthesia method:  None Laceration details:    Location:  Face   Face location:  Chin   Length (cm):  1 Repair type:    Repair type:  Simple Pre-procedure details:    Preparation:  Patient was prepped and draped in usual sterile fashion Exploration:    Hemostasis achieved with:  Direct pressure   Wound exploration: entire depth of wound probed and visualized     Contaminated: no   Treatment:    Area cleansed with:  Saline   Amount of cleaning:  Standard   Irrigation solution:  Sterile saline Skin repair:    Repair method:  Tissue adhesive Approximation:    Approximation:  Close   Vermilion border: well-aligned   Post-procedure details:    Dressing:  Open (no dressing)   Patient tolerance of procedure:  Tolerated well, no immediate complications       Rashmi Tallent Camprubi-Soms, PA-C 12/24/15 [id number]    Blanchie Dessert, MD 12/24/15 [phone]

## 2016-01-14 ENCOUNTER — Ambulatory Visit (INDEPENDENT_AMBULATORY_CARE_PROVIDER_SITE_OTHER): Payer: Medicare Other | Admitting: Family Medicine

## 2016-01-14 ENCOUNTER — Encounter: Payer: Self-pay | Admitting: Family Medicine

## 2016-01-14 ENCOUNTER — Encounter (INDEPENDENT_AMBULATORY_CARE_PROVIDER_SITE_OTHER): Payer: Self-pay

## 2016-01-14 VITALS — BP 138/65 | HR 112 | Temp 98.3°F | Resp 12 | Ht 65.5 in | Wt 162.0 lb

## 2016-01-14 DIAGNOSIS — E559 Vitamin D deficiency, unspecified: Secondary | ICD-10-CM | POA: Diagnosis not present

## 2016-01-14 DIAGNOSIS — Z23 Encounter for immunization: Secondary | ICD-10-CM

## 2016-01-14 DIAGNOSIS — I499 Cardiac arrhythmia, unspecified: Secondary | ICD-10-CM

## 2016-01-14 DIAGNOSIS — I10 Essential (primary) hypertension: Secondary | ICD-10-CM

## 2016-01-14 DIAGNOSIS — E1149 Type 2 diabetes mellitus with other diabetic neurological complication: Secondary | ICD-10-CM | POA: Diagnosis not present

## 2016-01-14 DIAGNOSIS — I674 Hypertensive encephalopathy: Secondary | ICD-10-CM | POA: Insufficient documentation

## 2016-01-14 DIAGNOSIS — R296 Repeated falls: Secondary | ICD-10-CM

## 2016-01-14 DIAGNOSIS — G629 Polyneuropathy, unspecified: Secondary | ICD-10-CM | POA: Insufficient documentation

## 2016-01-14 NOTE — Progress Notes (Signed)
HPI:   Alexa Brown is a 75 y.o. female, who is here today with her husband to establish care with me.  Former PCP: Dr Freda Munro. Last preventive routine visit: 11/2014  Concerns today: she needs to follow on some of her chronic medical problems.   DM II  Dx about 1 year ago. BS's 90-110's. She denies any hypoglycemia. She has not noted polyuria or polyphagia. + Polydipsia,thinks it is related to diabetes medication.  No abdominal pain, nausea, or vomiting. She is currently on Invokana but would like to stop it, afraid of side effects, denies having any. She doesn't remember trying a different medication.  Tingling, "pins and needles", and burning feet. She has followed with neurologist, currently on Cymbalta.   Vitamin D deficiency, currently she is on vitamin D 5000 units daily.  She does not follow a healthful diet. Exercise: Not currently due to husband illness.   Hypertension: Currently she is on Benazepril 20 mg and Benazepril-HCTZ 20-12.5 mg 1/2 tab each one. According to patient, her insurance didn't approve Benazepril-Hydrochlorthiazide higher dose.  Reporting no side effects from medication.  She denies any frequent/severe headache, visual chest pain, dyspnea, palpitation, or edema. S/P CVA in 1994 at the time she was Dx with HTN. She is on Plavix 75 mg daily.  Today noted sinus tachycardia and mildly irregular HR, she denies prior history.   GERD Currently she is on Protonix 40 mg twice a day, symptoms are well controlled as far as she takes medication daily.  Denies abdominal pain, changes in bowel habits, blood in stool or melena.  Hx of constipation, she is on Colace.  History of depression, currently she is taking Cymbalta 60 mg 2 tablets daily.  Cymbalta was started by neuro for neuropathy and dose increased about a year ago by PCP for depression. She states that she feels "overdrug." She denies suicidal thoughts. Currently she  is dealing with her husband's illness, who was recently diagnosed with pancreatic cancer and currently undergoing radiation and chemotherapy.   Reporting frequent falls, last one 2 weeks ago. States that her legs gave up and trips frequently, she has not done PT but was discussed with former neurologists during last f/u visit.  She lives with husband. + Unstable gait, she has a cane. Rest ADL's independent.    Review of Systems  Constitutional: Positive for fatigue. Negative for activity change, appetite change, fever and unexpected weight change.  HENT: Negative for mouth sores, nosebleeds and trouble swallowing.   Eyes: Negative for pain and visual disturbance.  Respiratory: Negative for cough, shortness of breath and wheezing.   Cardiovascular: Negative for chest pain, palpitations and leg swelling.  Gastrointestinal: Negative for abdominal pain, nausea and vomiting.       Negative for changes in bowel habits.  Endocrine: Positive for polydipsia. Negative for polyphagia and polyuria.  Genitourinary: Negative for decreased urine volume, difficulty urinating, dysuria and hematuria.  Musculoskeletal: Positive for arthralgias (knees mainly) and gait problem.  Skin: Negative for color change and rash.  Neurological: Positive for numbness. Negative for syncope, weakness and headaches.  Psychiatric/Behavioral: Negative for confusion, hallucinations and suicidal ideas. The patient is nervous/anxious.       No current outpatient prescriptions on file prior to visit.   No current facility-administered medications on file prior to visit.      Past Medical History:  Diagnosis Date  . Chicken pox   . Depression   . Diabetes mellitus without complication ([place])   .  GERD (gastroesophageal reflux disease)   . Hyperlipidemia   . Hypertension   . Stroke ([place])   . UTI (urinary tract infection)    No Known Allergies  Family History  Problem Relation Age of Onset  . Hyperlipidemia  Mother   . Heart disease Mother   . Stroke Mother   . Hypertension Mother   . Hyperlipidemia Father   . Heart disease Father   . Stroke Father   . Hypertension Father     Social History   Social History  . Marital status: Married    Spouse name: N/A  . Number of children: N/A  . Years of education: N/A   Social History Main Topics  . Smoking status: Never Smoker  . Smokeless tobacco: Never Used  . Alcohol use No  . Drug use: No  . Sexual activity: Not Currently   Other Topics Concern  . None   Social History Narrative  . None    Vitals:   01/14/16 1426  BP: 138/65  Pulse: (!) 112  Resp: 12  Temp: 98.3 F (36.8 C)   O2 sat 97% at RA.   Body mass index is 26.55 kg/m.    Physical Exam  Nursing note and vitals reviewed. Constitutional: She is oriented to person, place, and time. She appears well-developed. No distress.  HENT:  Head: Atraumatic.  Mouth/Throat: Oropharynx is clear and moist and mucous membranes are normal.  Eyes: Conjunctivae and EOM are normal. Pupils are equal, round, and reactive to light.  Neck: No JVD present. No tracheal deviation present. No thyroid mass and no thyromegaly present.  Cardiovascular: Normal rate and regular rhythm.   Occasional extrasystoles (x 2-3) are present.  No murmur heard. Pulses:      Dorsalis pedis pulses are 2+ on the right side, and 2+ on the left side.  Respiratory: Effort normal and breath sounds normal. No respiratory distress.  GI: Soft. She exhibits no mass. There is no hepatomegaly. There is no tenderness.  Musculoskeletal: She exhibits no edema.  Neurological: She is alert and oriented to person, place, and time. She has normal strength. Coordination normal.  Stable gait assisted by a cane  Skin: Skin is warm. No erythema.  Psychiatric: Her speech is normal. Her mood appears anxious.  Well groomed, good eye contact.    Diabetic foot exam:  Monofilament abnormal bilateral. Peripheral pulses  present (DP). + calluses + hypertrophic/long toenails.   ASSESSMENT AND PLAN:     Kashish was seen today for establish care.  Diagnoses and all orders for this visit:    Lab Results  Component Value Date   HGBA1C 6.1 01/14/2016   Lab Results  Component Value Date   CREATININE 1.06 01/14/2016   BUN 23 01/14/2016   NA 142 01/14/2016   K 4.0 01/14/2016   CL 106 01/14/2016   CO2 26 01/14/2016     DM (diabetes mellitus), type 2 with neurological complications ([place])  123456 pending. No changes in current management for now but will consider stopping Invokana depending on labs results. She does not recall trying Metformin before, may consider changing treatment if HgA1C not at goal. Regular exercise and healthy diet with avoidance of added sugar food intake is an important part of treatment and recommended. Annual eye exam, periodic dental and foot care recommended. F/U in 5-6 months  -     HgB A1c -     Basic metabolic panel -     Urine Microalbumin w/creat ratio  Irregular  heart rate  Asymptomatic. Tachycardia improved. Adequate hydration. EKG today : NSR,normal axis and intervals, ? LAE. Instructed about warning signs.  -     EKG 12-Lead  Vitamin D deficiency  No changes in current management, will follow labs done today and will give further recommendations accordingly.  -     VITAMIN D 25 Hydroxy (Vit-D Deficiency, Fractures)    Essential hypertension  Adequately controlled. No changes in current management. DASH-low salt diet recommended. Eye exam recommended annually. F/U in 6 months, before if needed.  -     Basic metabolic panel -     EKG [id number]   Peripheral polyneuropathy ([place])  Foot care discussed. Fall precautions. Continue Cymbalta but she will try decreasing dose, alternating 120 and 60 mg every other day.   Frequent falls  Most likely related to neuropathy and OA but could be aggravated by medications as well. PT will be  arranged. Fall precautions discussed.  -     Ambulatory referral to Physical Therapy  Need for immunization against influenza -     Flu vaccine HIGH DOSE PF        Clairissa G. Martinique, MD  [facility]. [place] office.

## 2016-01-14 NOTE — Patient Instructions (Signed)
A few things to remember from today's visit:   Irregular heart rate - Plan: EKG 12-Lead  Vitamin D deficiency - Plan: VITAMIN D 25 Hydroxy (Vit-D Deficiency, Fractures)  DM (diabetes mellitus), type 2 with neurological complications ([place]) - Plan: HgB 123456, Basic metabolic panel, Urine Microalbumin w/creat. ratio  Essential hypertension - Plan: Basic metabolic panel, EKG [id number]  Peripheral polyneuropathy ([facility])  Need for immunization against influenza - Plan: Flu vaccine HIGH DOSE PF  Try decreasing Cymbalta from 120 mg daily to alternating 60 and 120 every other day  Breast no changes until labs are back.  HgA1C goal < 7.0-8.0. Avoid sugar added food:regular soft drinks, energy drinks, and sports drinks. candy. cakes. cookies. pies and cobblers. sweet rolls, pastries, and donuts. fruit drinks, such as fruitades and fruit punch. dairy desserts, such as ice cream  Mediterranean diet has showed benefits for sugar control.  How much and what type of carbohydrate foods are important for managing diabetes. The balance between how much insulin is in your body and the carbohydrate you eat makes a difference in your blood glucose levels.  Fasting blood sugar ideally 130 or less, 2 hours after meals less than 180.   Regular exercise also will help with controlling disease, daily brisk walking as tolerated for 15-30 min definitively will help.   Avoid skipping meals, blood sugar might drop and cause serious problems. Remember checking feet periodically, good dental hygiene, and annual eye exam.     Please be sure medication list is accurate. If a new problem present, please set up appointment sooner than planned today.

## 2016-01-15 ENCOUNTER — Encounter: Payer: Self-pay | Admitting: Family Medicine

## 2016-01-15 LAB — MICROALBUMIN / CREATININE URINE RATIO
Creatinine,U: 44.3 mg/dL
Microalb Creat Ratio: 1.6 mg/g (ref 0.0–30.0)
Microalb, Ur: <0.7 mg/dL (ref 0.0–1.9)

## 2016-01-15 LAB — BASIC METABOLIC PANEL
BUN: 23 mg/dL (ref 6–23)
CALCIUM: 10.5 mg/dL (ref 8.4–10.5)
CO2: 26 mEq/L (ref 19–32)
Chloride: 106 mEq/L (ref 96–112)
Creatinine, Ser: 1.06 mg/dL (ref 0.40–1.20)
GFR: 53.92 mL/min — AB (ref >60.00)
GLUCOSE: 139 mg/dL — AB (ref 70–99)
Potassium: 4 mEq/L (ref 3.5–5.1)
SODIUM: 142 meq/L (ref 135–145)

## 2016-01-15 LAB — VITAMIN D 25 HYDROXY (VIT D DEFICIENCY, FRACTURES): VITD: 54.05 ng/mL (ref 30.00–100.00)

## 2016-01-15 LAB — HEMOGLOBIN A1C: HEMOGLOBIN A1C: 6.1 % (ref 4.6–6.5)

## 2016-01-16 ENCOUNTER — Encounter: Payer: Self-pay | Admitting: Family Medicine

## 2016-01-25 ENCOUNTER — Ambulatory Visit: Payer: Medicare Other | Attending: Family Medicine | Admitting: Physical Therapy

## 2016-01-25 DIAGNOSIS — M6281 Muscle weakness (generalized): Secondary | ICD-10-CM

## 2016-01-25 DIAGNOSIS — M545 Low back pain, unspecified: Secondary | ICD-10-CM

## 2016-01-25 DIAGNOSIS — R2681 Unsteadiness on feet: Secondary | ICD-10-CM | POA: Diagnosis present

## 2016-01-25 NOTE — Patient Instructions (Signed)
  Hip flexion: 10 times each Leg     10 times each Leg    10 times on each leg    10 times    Perform all exercises while holding onto your kitchen counter for stability.  Perform 2 times each day.  Progress to 2 sets of 10

## 2016-01-25 NOTE — Therapy (Signed)
[facility] [address] [place], [place], [address] Phone: [phone]   Fax:  [phone]  Physical Therapy Evaluation  Patient Details  Name: Alexa Brown MRN: [id number] Date of Birth: 1940-05-26 Referring Provider: Naiyah Martinique MD  Encounter Date: 01/25/2016      PT End of Session - 01/25/16 1506    Visit Number 1   Number of Visits 16   Date for PT Re-Evaluation 03/26/16   Authorization Type Kx modifier at visit 15   PT Start Time 1400   PT Stop Time 1450   PT Time Calculation (min) 50 min   Activity Tolerance Patient tolerated treatment well   Behavior During Therapy [facility] for tasks assessed/performed      Past Medical History:  Diagnosis Date  . Chicken pox   . Depression   . Diabetes mellitus without complication ([place])   . GERD (gastroesophageal reflux disease)   . Hyperlipidemia   . Hypertension   . Stroke ([place])   . UTI (urinary tract infection)     Past Surgical History:  Procedure Laterality Date  . ABDOMINAL HYSTERECTOMY    . APPENDECTOMY    . BREAST SURGERY     biopsy    There were no vitals filed for this visit.       Subjective Assessment - 01/25/16 1410    Subjective Pt arriving to therapy reporting several falls recently with the last fall causing some back pain due to hitting her back on the door frame. Pt reported fall occurred about 4 weeks ago. Pt also reporting her recent blood worked revealed low blood sugar.    Limitations Sitting;House hold activities   How long can you sit comfortably? 30 minutes   How long can you stand comfortably? 15 minutes   How long can you walk comfortably? 30 minutes   Patient Stated Goals stop falling, back pain stop hurting   Currently in Pain? No/denies   Pain Score 3   with movements   Pain Location Back   Pain Descriptors / Indicators Aching   Pain Type Acute pain   Pain Onset 1 to 4 weeks ago   Pain Frequency Intermittent    Aggravating Factors  household activities, twisting, bending   Pain Relieving Factors resting   Effect of Pain on Daily Activities limits household activities   Multiple Pain Sites No            [facility] PT Assessment - 01/25/16 0001      Assessment   Medical Diagnosis mid to lower back pain   Referring Provider Journe Martinique MD   Onset Date/Surgical Date 12/29/15   Hand Dominance Right   Next MD Visit follow up in 6 weeks     Precautions   Precautions Fall     Balance Screen   Has the patient fallen in the past 6 months Yes   How many times? 11   Has the patient had a decrease in activity level because of a fear of falling?  Yes   Is the patient reluctant to leave their home because of a fear of falling?  Yes     [place] Private residence   Living Arrangements Spouse/significant other   Available Help at Discharge Family   Type of Home Other(Comment)   Home Access Level entry   [place] - single point     Prior Function   Level of  Independence Independent   Vocation Retired   Leisure Associate Professor   Overall Cognitive Status Within Functional Limits for tasks assessed     ROM / Strength   AROM / PROM / Strength Strength     Strength   Overall Strength Deficits   Overall Strength Comments Pt bilateral UE's 4/5   Strength Assessment Site Hip   Right/Left Hip Right;Left   Right Hip Flexion 3+/5   Right Hip Extension 3+/5   Right Hip ABduction 3+/5   Right Hip ADduction 3+/5   Left Hip Flexion 3+/5   Left Hip Extension 3+/5   Left Hip ADduction 3+/5     Transfers   Five time sit to stand comments  33 seconds  no upper extremity support     Ambulation/Gait   Ambulation/Gait Yes   Ambulation/Gait Assistance 6: Modified independent (Device/Increase time)   Ambulation Distance (Feet) 50 Feet   Assistive device Straight cane   Gait Pattern Step-through pattern;Narrow base of  support;Poor foot clearance - left     Balance   Balance Assessed Yes     Standardized Balance Assessment   Standardized Balance Assessment Berg Balance Test     Berg Balance Test   Sit to Stand Able to stand without using hands and stabilize independently   Standing Unsupported Able to stand safely 2 minutes   Sitting with Back Unsupported but Feet Supported on Floor or Stool Able to sit safely and securely 2 minutes   Stand to Sit Sits safely with minimal use of hands   Transfers Able to transfer safely, minor use of hands   Standing Unsupported with Eyes Closed Able to stand 10 seconds with supervision   Standing Ubsupported with Feet Together Able to place feet together independently and stand 1 minute safely   From Standing, Reach Forward with Outstretched Arm Can reach forward >12 cm safely (5")   From Standing Position, Pick up Object from Floor Able to pick up shoe, needs supervision   From Standing Position, Turn to Look Behind Over each Shoulder Looks behind one side only/other side shows less weight shift   Turn 360 Degrees Able to turn 360 degrees safely one side only in 4 seconds or less   Standing Unsupported, Alternately Place Feet on Step/Stool Able to stand independently and complete 8 steps >20 seconds   Standing Unsupported, One Foot in Front Able to take small step independently and hold 30 seconds   Standing on One Leg Able to lift leg independently and hold equal to or more than 3 seconds   Total Score 46                           PT Education - 01/25/16 1505    Education provided Yes   Education Details Pt edu in HEP and instructed to consult her physician about episodes of low blood sugar per pt report.    Person(s) Educated Patient   Methods Explanation;Demonstration;Handout;Verbal cues;Tactile cues   Comprehension Verbalized understanding;Returned demonstration          PT Short Term Goals - 01/25/16 1514      PT SHORT TERM GOAL #1    Title Pt will be independent with her HEP   Time 4   Period Weeks   Status New     PT SHORT TERM GOAL #2   Title Pt will improve her exercise tolerance to 30 minutes with no rest break.  Time 4   Period Weeks   Status New     PT SHORT TERM GOAL #3   Title Pt will be able to demonstrate correct step through gait pattern with no scissoring or decresed foot clearence on the left LE to improve safety.    Time 4   Period Weeks   Status New           PT Long Term Goals - 08-Feb-2016 1516      PT LONG TERM GOAL #1   Title pt will improve her BERG score from 46 to 52 to improve functional mobility and dynamic balance safety.   Time 8   Period Weeks   Status New     PT LONG TERM GOAL #2   Title Pt will have reported no falls in the last 4 weeks.    Time 8   Period Weeks   Status New     PT LONG TERM GOAL #3   Title Pt will improve bilateral LE strength to grossly 4/5 bilaterally in order to improve gait.    Time 8   Period Weeks   Status New               Plan - 2016-02-08 1507    Clinical Impression Statement Pt is a 75 year old female complaining of recent falls >10 in the last 6 months. Pt also reporting several injuries due to falls. Pt's last fall pt reported hitting her back on the door frame. pt with no complaints of pain at rest but reporting pain with movement. pt amb with a straight cane in her right UE. Pt with weakness noted in bilateral hips grossly 3+/5. Pt also with decreased  dynamic balance based on BERG balance test. Skilled PT needed to address pt's impairments to improve pt's functional mobility.    Rehab Potential Good   PT Frequency 2x / week   PT Duration 8 weeks   PT Treatment/Interventions ADLs/Self Care Home Management;Patient/family education;Energy conservation;Electrical Stimulation;Moist Heat;Ultrasound;Balance training;Therapeutic exercise;Therapeutic activities;Functional mobility training;Stair training;Gait training;Manual  techniques;Passive range of motion   PT Next Visit Plan standing balance, lumbar stretching/strengthening, LE hip strengthening   PT Home Exercise Plan calf raises, hip flexion, hip abd, hip extension   Consulted and Agree with Plan of Care Patient      Patient will benefit from skilled therapeutic intervention in order to improve the following deficits and impairments:  Decreased activity tolerance, Decreased balance, Improper body mechanics, Pain, Decreased endurance, Decreased strength, Difficulty walking, Decreased mobility  Visit Diagnosis: Muscle weakness (generalized)  Gait instability  Acute midline low back pain without sciatica      G-Codes - 2016/02/08 1521    Functional Assessment Tool Used BERG, clinical assessment   Functional Limitation Mobility: Walking and moving around   Mobility: Walking and Moving Around Current Status [phone]) At least 40 percent but less than 60 percent impaired, limited or restricted   Mobility: Walking and Moving Around Goal Status [phone]) At least 20 percent but less than 40 percent impaired, limited or restricted       Problem List Patient Active Problem List   Diagnosis Date Noted  . Vitamin D deficiency 01/14/2016  . DM (diabetes mellitus), type 2 with neurological complications ([place]) [id number]  . Essential hypertension 01/14/2016  . Peripheral neuropathy ([place]) 01/14/2016    Oretha Caprice, MPT  2016-02-08, 3:22 PM  [facility] [address] [place], [place], [address] Phone: [phone]  Fax:  [phone]  Name: Alexa Brown MRN: [id number] Date of Birth: 1940/09/24

## 2016-01-28 ENCOUNTER — Ambulatory Visit: Payer: Medicare Other | Attending: Family Medicine | Admitting: Physical Therapy

## 2016-01-28 DIAGNOSIS — M545 Low back pain, unspecified: Secondary | ICD-10-CM

## 2016-01-28 DIAGNOSIS — M6281 Muscle weakness (generalized): Secondary | ICD-10-CM

## 2016-01-28 DIAGNOSIS — R2681 Unsteadiness on feet: Secondary | ICD-10-CM | POA: Diagnosis present

## 2016-01-28 DIAGNOSIS — R2689 Other abnormalities of gait and mobility: Secondary | ICD-10-CM

## 2016-01-28 NOTE — Therapy (Deleted)
[facility] [address], [place], [place], [address] Phone: [phone]   Fax:  [phone]  Physical Therapy Treatment  Patient Details  Name: Alexa Brown MRN: [id number] Date of Birth: 1940-12-05 Referring Provider: Gianni Martinique MD  Encounter Date: 01/28/2016      PT End of Session - 01/28/16 1233    Visit Number 2   Number of Visits 16   Date for PT Re-Evaluation 03/26/16   Authorization Type Kx modifier at visit 15   PT Start Time 1145   PT Stop Time 1233   PT Time Calculation (min) 48 min   Activity Tolerance Patient tolerated treatment well   Behavior During Therapy [facility] for tasks assessed/performed      Past Medical History:  Diagnosis Date  . Chicken pox   . Depression   . Diabetes mellitus without complication ([place])   . GERD (gastroesophageal reflux disease)   . Hyperlipidemia   . Hypertension   . Stroke ([place])   . UTI (urinary tract infection)     Past Surgical History:  Procedure Laterality Date  . ABDOMINAL HYSTERECTOMY    . APPENDECTOMY    . BREAST SURGERY     biopsy    There were no vitals filed for this visit.      Subjective Assessment - 01/28/16 1151    Currently in Pain? Yes   Pain Score 6    Pain Location Back   Pain Descriptors / Indicators Tightness;Cramping   Pain Type Acute pain   Pain Onset 1 to 4 weeks ago   Aggravating Factors  sitting especially at the end of the day   Multiple Pain Sites No                         [facility] Adult PT Treatment/Exercise - 01/28/16 0001      High Level Balance   High Level Balance Activities Head turns  narrow base of support - 10x each side   High Level Balance Comments tandem standing 30s each way, Narrow BOS EC 3x10s     Lumbar Exercises: Stretches   Single Knee to Chest Stretch 5 reps;10 seconds   Lower Trunk Rotation 5 reps  5 sec hold - bil     Lumbar Exercises: Aerobic   Stationary Bike Nustep  level 3 x 6 min  Simultaneous filing. User may not have seen previous data.     Knee/Hip Exercises: Aerobic   Nustep Nustep level 3 x 6 min     Knee/Hip Exercises: Sidelying   Clams 2x10 bil     Manual Therapy   Manual Therapy Soft tissue mobilization   Soft tissue mobilization bil thoracic paraspinals                PT Education - 01/28/16 1236    Education provided Yes   Education Details Pt edu and HEP handout provided   Person(s) Educated Patient   Methods Explanation;Handout;Tactile cues;Verbal cues   Comprehension Verbalized understanding          PT Short Term Goals - 01/28/16 1243      PT SHORT TERM GOAL #1   Title Pt will be independent with her HEP   Time 4   Period Weeks   Status On-going           PT Long Term Goals - 01/25/16 1516      PT LONG TERM GOAL #1   Title pt  will improve her BERG score from 46 to 52 to improve functional mobility and dynamic balance safety.   Time 8   Period Weeks   Status New     PT LONG TERM GOAL #2   Title Pt will have reported no falls in the last 4 weeks.    Time 8   Period Weeks   Status New     PT LONG TERM GOAL #3   Title Pt will improve bilateral LE strength to grossly 4/5 bilaterally in order to improve gait.    Time 8   Period Weeks   Status New               Plan - 01/28/16 1238    Clinical Impression Statement Pt presents with increased muscle spasm and increased pain in low thoracic paraspinals.  Pt able to progress balance exercises and added clamshells to HEP.  Pt will benefit from continued strengthening and balance training.  Cont manual as needed for muscle spasms.   Rehab Potential Good   PT Frequency 2x / week   PT Duration 8 weeks   PT Treatment/Interventions ADLs/Self Care Home Management;Patient/family education;Energy conservation;Electrical Stimulation;Moist Heat;Ultrasound;Balance training;Therapeutic exercise;Therapeutic activities;Functional mobility training;Stair  training;Gait training;Manual techniques;Passive range of motion   PT Next Visit Plan standing balance, lumbar stretching/strengthening, LE hip strengthening   PT Home Exercise Plan calf raises, hip flexion, hip abd, hip extension, clamshells   Consulted and Agree with Plan of Care Patient      Patient will benefit from skilled therapeutic intervention in order to improve the following deficits and impairments:  Decreased activity tolerance, Decreased balance, Improper body mechanics, Pain, Decreased endurance, Decreased strength, Difficulty walking, Decreased mobility  Visit Diagnosis: Muscle weakness (generalized)  Acute midline low back pain without sciatica  Other abnormalities of gait and mobility     Problem List Patient Active Problem List   Diagnosis Date Noted  . Vitamin D deficiency 01/14/2016  . DM (diabetes mellitus), type 2 with neurological complications ([place]) 99991111  . Essential hypertension 01/14/2016  . Peripheral neuropathy ([place]) 01/14/2016   Zannie Cove, PT 01/28/16 1:12 PM   [facility] [address], [place], [place], [address] Phone: [phone]   Fax:  [phone]  Name: Alexa Brown MRN: [id number] Date of Birth: Feb 03, 1941

## 2016-01-28 NOTE — Therapy (Signed)
[facility] [address], [place], [place], [address] Phone: [phone]   Fax:  [phone]  Physical Therapy Treatment  Patient Details  Name: Alexa Brown MRN: [id number] Date of Birth: 01-Jul-1940 Referring Provider: Jalyiah Martinique MD  Encounter Date: 01/28/2016      PT End of Session - 01/28/16 1233    Visit Number 2   Number of Visits 10   Date for PT Re-Evaluation 03/26/16   Authorization Type Kx modifier at visit 15   PT Start Time 1145   PT Stop Time 1233   PT Time Calculation (min) 48 min   Activity Tolerance Patient tolerated treatment well   Behavior During Therapy [facility] for tasks assessed/performed      Past Medical History:  Diagnosis Date  . Chicken pox   . Depression   . Diabetes mellitus without complication ([place])   . GERD (gastroesophageal reflux disease)   . Hyperlipidemia   . Hypertension   . Stroke ([place])   . UTI (urinary tract infection)     Past Surgical History:  Procedure Laterality Date  . ABDOMINAL HYSTERECTOMY    . APPENDECTOMY    . BREAST SURGERY     biopsy    There were no vitals filed for this visit.      Subjective Assessment - 01/28/16 1151    Subjective Pt reports her back is sore and has been worse over the past two days.  States she hasn't been doing anything differently and has been doing her HEP.   Limitations Sitting;House hold activities   How long can you sit comfortably? 30 minutes   How long can you stand comfortably? 15 minutes   How long can you walk comfortably? 30 minutes   Patient Stated Goals stop falling, back pain stop hurting   Currently in Pain? Yes   Pain Score 6    Pain Location Back   Pain Descriptors / Indicators Tightness;Cramping   Pain Type Acute pain   Pain Onset 1 to 4 weeks ago   Aggravating Factors  sitting especially at the end of the day   Pain Relieving Factors resting   Multiple Pain Sites No                          [facility] Adult PT Treatment/Exercise - 01/28/16 0001      High Level Balance   High Level Balance Activities Head turns  narrow base of support - 10x each side   High Level Balance Comments tandem standing 30s each way, Narrow BOS eyes closed 3x10s     Lumbar Exercises: Stretches   Single Knee to Chest Stretch 5 reps;10 seconds   Lower Trunk Rotation 5 reps  5 sec hold - bil     Lumbar Exercises: Aerobic   Stationary Bike --     Knee/Hip Exercises: Aerobic   Nustep Nustep level 3 x 6 min     Knee/Hip Exercises: Sidelying   Clams 2x10 bil     Manual Therapy   Manual Therapy Soft tissue mobilization   Soft tissue mobilization bil thoracic paraspinals                PT Education - 01/28/16 1236    Education provided Yes   Education Details Pt edu and HEP handout provided   Person(s) Educated Patient   Methods Explanation;Handout;Tactile cues;Verbal cues   Comprehension Verbalized understanding  PT Short Term Goals - 01/28/16 1243      PT SHORT TERM GOAL #1   Title Pt will be independent with her HEP   Time 4   Period Weeks   Status On-going           PT Long Term Goals - 01/25/16 1516      PT LONG TERM GOAL #1   Title pt will improve her BERG score from 46 to 52 to improve functional mobility and dynamic balance safety.   Time 8   Period Weeks   Status New     PT LONG TERM GOAL #2   Title Pt will have reported no falls in the last 4 weeks.    Time 8   Period Weeks   Status New     PT LONG TERM GOAL #3   Title Pt will improve bilateral LE strength to grossly 4/5 bilaterally in order to improve gait.    Time 8   Period Weeks   Status New               Plan - 01/28/16 1238    Clinical Impression Statement Pt presents with increased muscle spasm and increased pain in low thoracic paraspinals.  Pt able to progress balance exercises and added clamshells to HEP.  Pt will benefit from continued  strengthening and balance training.  Cont manual as needed for muscle spasms.   Rehab Potential Good   Clinical Impairments Affecting Rehab Potential none   PT Frequency 2x / week   PT Duration 8 weeks   PT Treatment/Interventions ADLs/Self Care Home Management;Patient/family education;Energy conservation;Electrical Stimulation;Moist Heat;Ultrasound;Balance training;Therapeutic exercise;Therapeutic activities;Functional mobility training;Stair training;Gait training;Manual techniques;Passive range of motion   PT Next Visit Plan standing balance, lumbar stretching/strengthening, LE hip strengthening   PT Home Exercise Plan calf raises, hip flexion, hip abd, hip extension, clamshells   Consulted and Agree with Plan of Care Patient      Patient will benefit from skilled therapeutic intervention in order to improve the following deficits and impairments:  Decreased activity tolerance, Decreased balance, Improper body mechanics, Pain, Decreased endurance, Decreased strength, Difficulty walking, Decreased mobility  Visit Diagnosis: Muscle weakness (generalized)  Acute midline low back pain without sciatica  Other abnormalities of gait and mobility     Problem List Patient Active Problem List   Diagnosis Date Noted  . Vitamin D deficiency 01/14/2016  . DM (diabetes mellitus), type 2 with neurological complications ([place]) 99991111  . Essential hypertension 01/14/2016  . Peripheral neuropathy ([place]) 01/14/2016    Lovett Calender, PT 01/28/2016, 2:46 PM  [facility] [address] [place], [place], [address] Phone: [phone]   Fax:  [phone]  Name: Alexa Brown MRN: [id number] Date of Birth: 1940/12/29

## 2016-01-28 NOTE — Patient Instructions (Addendum)
Abduction: Clam (Eccentric) - Side-Lying    Lie on side with knees bent. Lift top knee, keeping feet together. Keep trunk steady. Slowly lower for 3-5 seconds. 10 reps per set, _2  sets per day, ___ days per week..  http://ecce.exer.us/65   Copyright  VHI. All rights reserved.  [place] [address], [place], [place] [address] Phone # [phone] Fax [phone]

## 2016-02-02 ENCOUNTER — Ambulatory Visit: Payer: Medicare Other | Admitting: Physical Therapy

## 2016-02-02 ENCOUNTER — Encounter: Payer: Self-pay | Admitting: Physical Therapy

## 2016-02-02 DIAGNOSIS — M6281 Muscle weakness (generalized): Secondary | ICD-10-CM | POA: Diagnosis not present

## 2016-02-02 DIAGNOSIS — M545 Low back pain, unspecified: Secondary | ICD-10-CM

## 2016-02-02 DIAGNOSIS — R2681 Unsteadiness on feet: Secondary | ICD-10-CM

## 2016-02-02 DIAGNOSIS — R2689 Other abnormalities of gait and mobility: Secondary | ICD-10-CM

## 2016-02-02 NOTE — Therapy (Signed)
[facility] [address] [place], [place], [address] Phone: [phone]   Fax:  [phone]  Physical Therapy Treatment  Patient Details  Name: Alexa Brown MRN: [id number] Date of Birth: [birth year] Referring Provider: Mathilda Martinique MD  Encounter Date: 02/02/2016      PT End of Session - 02/02/16 1532    Visit Number 3   Number of Visits 10   Date for PT Re-Evaluation 03/26/16   Authorization Type Kx modifier at visit 15   PT Start Time 1532   PT Stop Time 1625   PT Time Calculation (min) 53 min   Activity Tolerance Patient tolerated treatment well   Behavior During Therapy [facility] for tasks assessed/performed      Past Medical History:  Diagnosis Date  . Chicken pox   . Depression   . Diabetes mellitus without complication ([place])   . GERD (gastroesophageal reflux disease)   . Hyperlipidemia   . Hypertension   . Stroke ([place])   . UTI (urinary tract infection)     Past Surgical History:  Procedure Laterality Date  . ABDOMINAL HYSTERECTOMY    . APPENDECTOMY    . BREAST SURGERY     biopsy    There were no vitals filed for this visit.      Subjective Assessment - 02/02/16 1531    Subjective Pt reports back is pain is now radiating further up the spine, is planning to call MD tomorrow reguarding pain.    Limitations Sitting;House hold activities   How long can you sit comfortably? 30 minutes   How long can you stand comfortably? 15 minutes   How long can you walk comfortably? 30 minutes   Patient Stated Goals stop falling, back pain stop hurting   Currently in Pain? Yes   Pain Score 5    Pain Location Back   Pain Descriptors / Indicators Tightness;Cramping   Pain Type Acute pain   Pain Onset 1 to 4 weeks ago   Pain Frequency Intermittent                         [facility] Adult PT Treatment/Exercise - 02/02/16 0001      Exercises   Exercises Shoulder     Lumbar Exercises:  Stretches   Single Knee to Chest Stretch 5 reps;10 seconds   Lower Trunk Rotation 5 reps  5 sec hold - bil     Knee/Hip Exercises: Aerobic   Nustep Nustep level 3 x 6 min  Therapist present to discuss treatment     Knee/Hip Exercises: Supine   Hip Adduction Isometric Strengthening  x10   Bridges with Diona Foley Squeeze --  Bridges wthout ball squeeze x10   Bridges with Clamshell --  Clamshell without bridge x10 red band     Shoulder Exercises: Supine   Horizontal ABduction Strengthening;Both;20 reps;Theraband  with rolled towel behind shoulders for thoracic extension   Theraband Level (Shoulder Horizontal ABduction) Level 2 (Red)   External Rotation Strengthening;Both;10 reps  Rolled towel behind back for thoracic extension   Theraband Level (Shoulder External Rotation) Level 2 (Red)   Flexion Strengthening;Both;10 reps;Theraband  rolled towel behind back for thoracic extension   Theraband Level (Shoulder Flexion) Level 2 (Red)     Modalities   Modalities Electrical Stimulation;Moist Heat     Moist Heat Therapy   Number Minutes Moist Heat 15 Minutes   Moist Heat Location Lumbar Spine     Electrical Stimulation  Electrical Stimulation Location Bil paraspinals   Electrical Stimulation Action IFC   Electrical Stimulation Parameters to tolerance   Electrical Stimulation Goals Pain                PT Education - 02/02/16 1606    Education provided Yes   Education Details posture and body mechanics   Person(s) Educated Patient   Methods Explanation;Demonstration;Handout   Comprehension Verbalized understanding          PT Short Term Goals - 02/02/16 1551      PT SHORT TERM GOAL #1   Title Pt will be independent with her HEP   Time 4   Period Weeks   Status Achieved     PT SHORT TERM GOAL #2   Title Pt will improve her exercise tolerance to 30 minutes with no rest break.    Time 4   Period Weeks   Status On-going     PT SHORT TERM GOAL #3   Title Pt  will be able to demonstrate correct step through gait pattern with no scissoring or decresed foot clearence on the left LE to improve safety.    Time 4   Period Weeks   Status On-going           PT Long Term Goals - 02/02/16 1552      PT LONG TERM GOAL #1   Title pt will improve her BERG score from 46 to 52 to improve functional mobility and dynamic balance safety.   Time 8   Period Weeks   Status On-going     PT LONG TERM GOAL #2   Title Pt will have reported no falls in the last 4 weeks.    Time 8   Period Weeks   Status On-going     PT LONG TERM GOAL #3   Title Pt will improve bilateral LE strength to grossly 4/5 bilaterally in order to improve gait.    Time 8   Period Weeks   Status On-going               Plan - 02/02/16 1545    Clinical Impression Statement Pt presents with increase thoracic kyphosis and pain radiating from below shoulder blades dow to waistline. Pt able to tolerate thoracic extension and chest stretching.  Pt hobbies involve a lot of sitting and looking down. Pt educated in proper body mechanics and posture. Pt will continue to benefit from skilled therapy for postureal strenghtening and core stability needed for balance.    Rehab Potential Good   Clinical Impairments Affecting Rehab Potential none   PT Frequency 2x / week   PT Duration 8 weeks   PT Treatment/Interventions ADLs/Self Care Home Management;Patient/family education;Energy conservation;Electrical Stimulation;Moist Heat;Ultrasound;Balance training;Therapeutic exercise;Therapeutic activities;Functional mobility training;Stair training;Gait training;Manual techniques;Passive range of motion   PT Next Visit Plan core strength and stability and stretching   Consulted and Agree with Plan of Care Patient      Patient will benefit from skilled therapeutic intervention in order to improve the following deficits and impairments:  Decreased activity tolerance, Decreased balance, Improper body  mechanics, Pain, Decreased endurance, Decreased strength, Difficulty walking, Decreased mobility  Visit Diagnosis: Muscle weakness (generalized)  Acute midline low back pain without sciatica  Other abnormalities of gait and mobility  Gait instability     Problem List Patient Active Problem List   Diagnosis Date Noted  . Vitamin D deficiency 01/14/2016  . DM (diabetes mellitus), type 2 with neurological complications ([place]) 99991111  .  Essential hypertension 01/14/2016  . Peripheral neuropathy ([place]) 01/14/2016    Mikle Bosworth PTA 02/02/2016, 4:12 PM  [facility] [address] [place], [place], [address] Phone: [phone]   Fax:  [phone]  Name: Alexa Brown MRN: [id number] Date of Birth: February 21, 1941

## 2016-02-02 NOTE — Patient Instructions (Signed)
Posture Tips DO: - stand tall and erect - keep chin tucked in - keep head and shoulders in alignment - check posture regularly in mirror or large window - pull head back against headrest in car seat;  Change your position often.  Sit with lumbar support. DON'T: - slouch or slump while watching TV or reading - sit, stand or lie in one position  for too long;  Sitting is especially hard on the spine so if you sit at a desk/use the computer, then stand up often!   Copyright  VHI. All rights reserved.  Posture - Standing   Good posture is important. Avoid slouching and forward head thrust. Maintain curve in low back and align ears over shoul- ders, hips over ankles.  Pull your belly button in toward your back bone.   Copyright  VHI. All rights reserved.  Posture - Sitting   Sit upright, head facing forward. Try using a roll to support lower back. Keep shoulders relaxed, and avoid rounded back. Keep hips level with knees. Avoid crossing legs for long periods.   Copyright  VHI. All rights reserved.     Lifting Principles  .Maintain proper posture and head alignment. .Slide object as close as possible before lifting. .Move obstacles out of the way. .Test before lifting; ask for help if too heavy. .Tighten stomach muscles without holding breath. .Use smooth movements; do not jerk. .Use legs to do the work, and pivot with feet. .Distribute the work load symmetrically and close to the center of trunk. .Push instead of pull whenever possible.   Squat down and hold basket close to stand. Use leg muscles to do the work.    Avoid twisting or bending back. Pivot around using foot movements, and bend at knees if needed when reaching for articles.        Getting Into / Out of Bed   Lower self to lie down on one side by raising legs and lowering head at the same time. Use arms to assist moving without twisting. Bend both knees to roll onto back if desired. To sit up, start from  lying on side, and use same move-ments in reverse. Keep trunk aligned with legs.    Shift weight from front foot to back foot as item is lifted off shelf.    When leaning forward to pick object up from floor, extend one leg out behind. Keep back straight. Hold onto a sturdy support with other hand.      Sit upright, head facing forward. Try using a roll to support lower back. Keep shoulders relaxed, and avoid rounded back. Keep hips level with knees. Avoid crossing legs for long periods.  Jeanie Sewer PTA [facility] [address], [place],  [address] Phone # [phone] Fax [phone]

## 2016-02-09 ENCOUNTER — Ambulatory Visit: Payer: Medicare Other | Admitting: Physical Therapy

## 2016-02-09 DIAGNOSIS — R2681 Unsteadiness on feet: Secondary | ICD-10-CM

## 2016-02-09 DIAGNOSIS — M6281 Muscle weakness (generalized): Secondary | ICD-10-CM

## 2016-02-09 DIAGNOSIS — M545 Low back pain, unspecified: Secondary | ICD-10-CM

## 2016-02-09 DIAGNOSIS — R2689 Other abnormalities of gait and mobility: Secondary | ICD-10-CM

## 2016-02-09 NOTE — Patient Instructions (Addendum)
Alexa Brown PT [facility] [address], [address] [place], [place] [address] Phone # [phone] Fax [phone]    

## 2016-02-09 NOTE — Therapy (Signed)
[facility] [address], [place], [place], [address] Phone: [phone]   Fax:  [phone]  Physical Therapy Treatment  Patient Details  Name: Alexa Brown MRN: [id number] Date of Birth: 10/06/1940 Referring Provider: Briena Martinique MD  Encounter Date: 02/09/2016      PT End of Session - 02/09/16 1514    Visit Number 4   Number of Visits 10   Date for PT Re-Evaluation 03/26/16   Authorization Type Kx modifier at visit 15   PT Start Time 1448   PT Stop Time 1545   PT Time Calculation (min) 57 min   Activity Tolerance Patient tolerated treatment well      Past Medical History:  Diagnosis Date  . Chicken pox   . Depression   . Diabetes mellitus without complication ([place])   . GERD (gastroesophageal reflux disease)   . Hyperlipidemia   . Hypertension   . Stroke ([place])   . UTI (urinary tract infection)     Past Surgical History:  Procedure Laterality Date  . ABDOMINAL HYSTERECTOMY    . APPENDECTOMY    . BREAST SURGERY     biopsy    There were no vitals filed for this visit.      Subjective Assessment - 02/09/16 1452    Subjective I'm not wobbling all over the place now.  My back still hurts.  Sitting in lounge chair helps with vibrator.  I talked myself out of calling the doctor about the back pain.  Hurts to stand.  Felt good with e-stim/heat for about 24 hours.     Currently in Pain? Yes   Pain Score 6    Pain Location Back   Pain Type Acute pain                         [facility] Adult PT Treatment/Exercise - 02/09/16 0001      Lumbar Exercises: Standing   Other Standing Lumbar Exercises weight shifting 4 ways no arms   Other Standing Lumbar Exercises tandem stand with head movements and UE movements with eyes open     Lumbar Exercises: Supine   Ab Set 10 reps   Bent Knee Raise 10 reps   Isometric Hip Flexion 10 reps     Knee/Hip Exercises: Aerobic   Nustep Nustep  level 3 x 6 min  Therapist present to discuss treatment     Knee/Hip Exercises: Standing   Other Standing Knee Exercises eyes closed 3x     Shoulder Exercises: Supine   Horizontal ABduction Strengthening;Both;20 reps;Theraband  with rolled towel behind shoulders for thoracic extension   Theraband Level (Shoulder Horizontal ABduction) Level 2 (Red)   External Rotation Strengthening;Both;10 reps  Rolled towel behind back for thoracic extension   Theraband Level (Shoulder External Rotation) Level 2 (Red)   Flexion Strengthening;Both;10 reps;Theraband  rolled towel behind back for thoracic extension   Theraband Level (Shoulder Flexion) Level 2 (Red)     Moist Heat Therapy   Number Minutes Moist Heat 15 Minutes   Moist Heat Location Lumbar Spine     Electrical Stimulation   Electrical Stimulation Location bilateral paraspinals   Electrical Stimulation Action IFC supine   Electrical Stimulation Parameters to tolerance 9 ma 15 min   Electrical Stimulation Goals Pain                PT Education - 02/09/16 1513    Education provided Yes   Education Details  abdominal brace series   Person(s) Educated Patient   Methods Explanation;Demonstration;Handout   Comprehension Verbalized understanding;Returned demonstration          PT Short Term Goals - 02/09/16 1727      PT SHORT TERM GOAL #1   Title Pt will be independent with her HEP   Status Achieved     PT SHORT TERM GOAL #2   Title Pt will improve her exercise tolerance to 30 minutes with no rest break.    Time 4   Period Weeks   Status On-going     PT SHORT TERM GOAL #3   Title Pt will be able to demonstrate correct step through gait pattern with no scissoring or decresed foot clearence on the left LE to improve safety.    Time 4   Period Weeks   Status On-going           PT Long Term Goals - 02/09/16 1727      PT LONG TERM GOAL #1   Title pt will improve her BERG score from 46 to 52 to improve functional  mobility and dynamic balance safety.   Time 8   Period Weeks   Status On-going     PT LONG TERM GOAL #2   Title Pt will have reported no falls in the last 4 weeks.    Time 8   Period Weeks   Status On-going     PT LONG TERM GOAL #3   Title Pt will improve bilateral LE strength to grossly 4/5 bilaterally in order to improve gait.    Time 8   Period Weeks   Status On-going               Plan - 02/09/16 1723    Clinical Impression Statement The patient is able to participate in a progression of postural and core strengthening in supine without pain exacerbation.  She is able to participate in standing dynamic balance ex's with min difficulty with narrow base of support however with eyes closed she loses her balance immediately requiring UE assist to stabilize.  She reports excellent relief from e-stim/heat.  Provided info on home TENS.  Therapist closely monitoring response with all interventions.     PT Next Visit Plan core strength/stabilization;  balance ex;  e-stim/heat for pain control      Patient will benefit from skilled therapeutic intervention in order to improve the following deficits and impairments:     Visit Diagnosis: Muscle weakness (generalized)  Acute midline low back pain without sciatica  Other abnormalities of gait and mobility  Gait instability     Problem List Patient Active Problem List   Diagnosis Date Noted  . Vitamin D deficiency 01/14/2016  . DM (diabetes mellitus), type 2 with neurological complications ([place]) 99991111  . Essential hypertension 01/14/2016  . Peripheral neuropathy ([place]) 01/14/2016    Alexa Brown 02/09/2016, 5:28 PM  [facility] [address], [place], [place], [address] Phone: [phone]   Fax:  [phone]  Name: Alexa Brown MRN: [id number] Date of Birth: 07/28/1940

## 2016-02-09 NOTE — Therapy (Signed)
[facility] [address] [place], [place], [address] Phone: [phone]   Fax:  [phone]  Physical Therapy Treatment  Patient Details  Name: JEANISE MEMON MRN: [id number] Date of Birth: 11-09-1940 Referring Provider: Titiana Martinique MD  Encounter Date: 02/09/2016      PT End of Session - 02/09/16 1514    Visit Number 4   Number of Visits 10   Date for PT Re-Evaluation 03/26/16   Authorization Type Kx modifier at visit 15   PT Start Time 1448   PT Stop Time 1540   PT Time Calculation (min) 52 min   Activity Tolerance Patient tolerated treatment well      Past Medical History:  Diagnosis Date  . Chicken pox   . Depression   . Diabetes mellitus without complication ([place])   . GERD (gastroesophageal reflux disease)   . Hyperlipidemia   . Hypertension   . Stroke ([place])   . UTI (urinary tract infection)     Past Surgical History:  Procedure Laterality Date  . ABDOMINAL HYSTERECTOMY    . APPENDECTOMY    . BREAST SURGERY     biopsy    There were no vitals filed for this visit.      Subjective Assessment - 02/09/16 1452    Subjective I'm not wobbling all over the place now.  My back still hurts.  Sitting in lounge chair helps with vibrator.  I talked myself out of calling the doctor about the back pain.  Hurts to stand.  Felt good with e-stim/heat for about 24 hours.     Currently in Pain? Yes   Pain Score 6    Pain Location Back   Pain Type Acute pain                         [facility] Adult PT Treatment/Exercise - 02/09/16 0001      Lumbar Exercises: Standing   Other Standing Lumbar Exercises weight shifting 4 ways no arms   Other Standing Lumbar Exercises tandem stand with head movements and UE movements with eyes open     Lumbar Exercises: Supine   Ab Set 10 reps   Bent Knee Raise 10 reps   Isometric Hip Flexion 10 reps     Knee/Hip Exercises: Aerobic   Nustep Nustep  level 3 x 6 min  Therapist present to discuss treatment     Knee/Hip Exercises: Standing   Other Standing Knee Exercises eyes closed 3x     Shoulder Exercises: Supine   Horizontal ABduction Strengthening;Both;20 reps;Theraband  with rolled towel behind shoulders for thoracic extension   Theraband Level (Shoulder Horizontal ABduction) Level 2 (Red)   External Rotation Strengthening;Both;10 reps  Rolled towel behind back for thoracic extension   Theraband Level (Shoulder External Rotation) Level 2 (Red)   Flexion Strengthening;Both;10 reps;Theraband  rolled towel behind back for thoracic extension   Theraband Level (Shoulder Flexion) Level 2 (Red)     Moist Heat Therapy   Number Minutes Moist Heat 15 Minutes   Moist Heat Location Lumbar Spine     Electrical Stimulation   Electrical Stimulation Location bilateral paraspinals   Electrical Stimulation Action IFC supine   Electrical Stimulation Parameters to tolerance 9 ma 15 min   Electrical Stimulation Goals Pain                PT Education - 02/09/16 1513    Education provided Yes   Education Details  abdominal brace series   Person(s) Educated Patient   Methods Explanation;Demonstration;Handout   Comprehension Verbalized understanding;Returned demonstration          PT Short Term Goals - 02/09/16 1727      PT SHORT TERM GOAL #1   Title Pt will be independent with her HEP   Status Achieved     PT SHORT TERM GOAL #2   Title Pt will improve her exercise tolerance to 30 minutes with no rest break.    Time 4   Period Weeks   Status On-going     PT SHORT TERM GOAL #3   Title Pt will be able to demonstrate correct step through gait pattern with no scissoring or decresed foot clearence on the left LE to improve safety.    Time 4   Period Weeks   Status On-going           PT Long Term Goals - 02/09/16 1727      PT LONG TERM GOAL #1   Title pt will improve her BERG score from 46 to 52 to improve functional  mobility and dynamic balance safety.   Time 8   Period Weeks   Status On-going     PT LONG TERM GOAL #2   Title Pt will have reported no falls in the last 4 weeks.    Time 8   Period Weeks   Status On-going     PT LONG TERM GOAL #3   Title Pt will improve bilateral LE strength to grossly 4/5 bilaterally in order to improve gait.    Time 8   Period Weeks   Status On-going               Plan - 02/09/16 1723    Clinical Impression Statement The patient is able to participate in a progression of postural and core strengthening in supine without pain exacerbation.  She is able to participate in standing dynamic balance ex's with min difficulty with narrow base of support however with eyes closed she loses her balance immediately requiring UE assist to stabilize.  She reports excellent relief from e-stim/heat.  Provided info on home TENS.  Therapist closely monitoring response with all interventions.     PT Next Visit Plan core strength/stabilization;  balance ex;  e-stim/heat for pain control      Patient will benefit from skilled therapeutic intervention in order to improve the following deficits and impairments:     Visit Diagnosis: Muscle weakness (generalized)  Acute midline low back pain without sciatica  Other abnormalities of gait and mobility  Gait instability     Problem List Patient Active Problem List   Diagnosis Date Noted  . Vitamin D deficiency 01/14/2016  . DM (diabetes mellitus), type 2 with neurological complications ([place]) 99991111  . Essential hypertension 01/14/2016  . Peripheral neuropathy ([place]) 01/14/2016   Ruben Im, PT 02/09/16 5:30 PM Phone: [phone] Fax: [phone] Alvera Singh 02/09/2016, 5:29 PM  [facility] [address] [place], [place], [address] Phone: [phone]   Fax:  [phone]  Name: JEHNNA YAKEL MRN: [id number] Date of Birth:  1940-10-06

## 2016-02-11 ENCOUNTER — Ambulatory Visit: Payer: Medicare Other | Admitting: Physical Therapy

## 2016-02-11 DIAGNOSIS — R2689 Other abnormalities of gait and mobility: Secondary | ICD-10-CM

## 2016-02-11 DIAGNOSIS — M545 Low back pain, unspecified: Secondary | ICD-10-CM

## 2016-02-11 DIAGNOSIS — M6281 Muscle weakness (generalized): Secondary | ICD-10-CM | POA: Diagnosis not present

## 2016-02-11 DIAGNOSIS — R2681 Unsteadiness on feet: Secondary | ICD-10-CM

## 2016-02-11 NOTE — Therapy (Signed)
[facility] [address], [place], [place], [address] Phone: [phone]   Fax:  [phone]  Physical Therapy Treatment  Patient Details  Name: Alexa Brown MRN: [id number] Date of Birth: 1940/10/16 Referring Provider: Andi Martinique MD  Encounter Date: 02/11/2016      PT End of Session - 02/11/16 1553    Visit Number 5   Number of Visits 10   Date for PT Re-Evaluation 03/26/16   Authorization Type Kx modifier at visit 15   PT Start Time 1530   PT Stop Time 1620   PT Time Calculation (min) 50 min   Activity Tolerance Patient tolerated treatment well      Past Medical History:  Diagnosis Date  . Chicken pox   . Depression   . Diabetes mellitus without complication ([place])   . GERD (gastroesophageal reflux disease)   . Hyperlipidemia   . Hypertension   . Stroke ([place])   . UTI (urinary tract infection)     Past Surgical History:  Procedure Laterality Date  . ABDOMINAL HYSTERECTOMY    . APPENDECTOMY    . BREAST SURGERY     biopsy    There were no vitals filed for this visit.      Subjective Assessment - 02/11/16 1532    Subjective Denies pain or soreness after last session.  Relief from e-stim/heat until later that evening.  I'm thinking about getting on of the TENS for home.  My balance is getting better, I've put my cane away for a while.     Currently in Pain? Yes   Pain Score 6    Pain Location Back   Pain Type Acute pain   Aggravating Factors  sitting;  standing for any length of time                         [facility] Adult PT Treatment/Exercise - 02/11/16 0001      Lumbar Exercises: Standing   Other Standing Lumbar Exercises red band rows 15x   Other Standing Lumbar Exercises red band pull downs 15x     Lumbar Exercises: Supine   Ab Set 10 reps   Bent Knee Raise 10 reps   Isometric Hip Flexion 10 reps     Knee/Hip Exercises: Aerobic   Nustep Nustep level 3 x 6  min  Therapist present to discuss treatment     Knee/Hip Exercises: Standing   Rebounder weight shifting side to side, tandem stand right and left 1 min x3     Knee/Hip Exercises: Sidelying   Clams 15x red band right /left     Moist Heat Therapy   Number Minutes Moist Heat 15 Minutes   Moist Heat Location Lumbar Spine     Electrical Stimulation   Electrical Stimulation Location bilateral paraspinals   Electrical Stimulation Action IFC    Electrical Stimulation Parameters 9 ma supine   Electrical Stimulation Goals Pain                  PT Short Term Goals - 02/11/16 1604      PT SHORT TERM GOAL #1   Title Pt will be independent with her HEP   Status Achieved     PT SHORT TERM GOAL #2   Title Pt will improve her exercise tolerance to 30 minutes with no rest break.    Status Achieved     PT SHORT TERM GOAL #3   Title Pt  will be able to demonstrate correct step through gait pattern with no scissoring or decresed foot clearence on the left LE to improve safety.    Time 4   Period Weeks   Status Achieved           PT Long Term Goals - 02/11/16 1605      PT LONG TERM GOAL #1   Title pt will improve her BERG score from 46 to 52 to improve functional mobility and dynamic balance safety.   Time 8   Period Weeks   Status On-going     PT LONG TERM GOAL #2   Title Pt will have reported no falls in the last 4 weeks.    Time 8   Period Weeks   Status On-going     PT LONG TERM GOAL #3   Title Pt will improve bilateral LE strength to grossly 4/5 bilaterally in order to improve gait.    Time 8   Period Weeks   Status On-going               Plan - 02/11/16 1553    Clinical Impression Statement The patient continues to report unchanging low back pain but she is able to participate in a  low to moderate intensity core strengthening without pain behaviors or exacerbation of back pain.  Able to progress dynamic balance ex to a soft surface with supervision  for safety but no loss of balance.   The patient does report that after exercise her back "does feel better."  Verbal cues to avoid holding breath with ex and to monitor pain response.  All short term goals met.     PT Next Visit Plan core strength/stabilization;  balance ex;  e-stim/heat for pain control      Patient will benefit from skilled therapeutic intervention in order to improve the following deficits and impairments:     Visit Diagnosis: Muscle weakness (generalized)  Acute midline low back pain without sciatica  Other abnormalities of gait and mobility  Gait instability     Problem List Patient Active Problem List   Diagnosis Date Noted  . Vitamin D deficiency 01/14/2016  . DM (diabetes mellitus), type 2 with neurological complications ([place]) 65/68/1275  . Essential hypertension 01/14/2016  . Peripheral neuropathy ([place]) 01/14/2016   Ruben Im, PT 02/11/16 4:06 PM Phone: [phone] Fax: [phone]  Alvera Singh 02/11/2016, 4:06 PM  [facility] [address] [place], [place], [address] Phone: [phone]   Fax:  [phone]  Name: Alexa Brown MRN: [id number] Date of Birth: [birth year]

## 2016-02-12 ENCOUNTER — Telehealth: Payer: Self-pay

## 2016-02-12 ENCOUNTER — Encounter: Payer: Self-pay | Admitting: Family Medicine

## 2016-02-12 DIAGNOSIS — H409 Unspecified glaucoma: Secondary | ICD-10-CM | POA: Insufficient documentation

## 2016-02-12 DIAGNOSIS — G4733 Obstructive sleep apnea (adult) (pediatric): Secondary | ICD-10-CM | POA: Insufficient documentation

## 2016-02-12 DIAGNOSIS — E785 Hyperlipidemia, unspecified: Secondary | ICD-10-CM | POA: Insufficient documentation

## 2016-02-12 DIAGNOSIS — I639 Cerebral infarction, unspecified: Secondary | ICD-10-CM | POA: Insufficient documentation

## 2016-02-12 DIAGNOSIS — E1169 Type 2 diabetes mellitus with other specified complication: Secondary | ICD-10-CM | POA: Insufficient documentation

## 2016-02-12 DIAGNOSIS — F339 Major depressive disorder, recurrent, unspecified: Secondary | ICD-10-CM | POA: Insufficient documentation

## 2016-02-12 MED ORDER — BUPROPION HCL ER (XL) 300 MG PO TB24: 300.0000 mg | ORAL_TABLET | Freq: Every day | ORAL | 0 refills | Status: DC

## 2016-02-12 NOTE — Telephone Encounter (Signed)
I did not address Wellbutrin last OV, She reported Cymbalta. Wellbutrin XL 300 mg can be continue but once daily [med list says 2 daily]. # 30/0 Thanks

## 2016-02-12 NOTE — Telephone Encounter (Signed)
Received a refill request for #90 for Bupropion XL 300 mg. Patient does have an appt on 11/30. Refill okay?

## 2016-02-12 NOTE — Telephone Encounter (Signed)
Rx sent 

## 2016-02-16 ENCOUNTER — Ambulatory Visit: Payer: Medicare Other | Admitting: Physical Therapy

## 2016-02-16 DIAGNOSIS — R2689 Other abnormalities of gait and mobility: Secondary | ICD-10-CM

## 2016-02-16 DIAGNOSIS — M6281 Muscle weakness (generalized): Secondary | ICD-10-CM

## 2016-02-16 DIAGNOSIS — M545 Low back pain, unspecified: Secondary | ICD-10-CM

## 2016-02-16 DIAGNOSIS — R2681 Unsteadiness on feet: Secondary | ICD-10-CM

## 2016-02-16 NOTE — Therapy (Signed)
[facility] [address] [place], [place], [address] Phone: [phone]   Fax:  [phone]  Physical Therapy Treatment  Patient Details  Name: Alexa Brown MRN: [id number] Date of Birth: January 27, 1941 Referring Provider: Timmie Martinique MD  Encounter Date: 02/16/2016      PT End of Session - 02/16/16 1524    Visit Number 6   Number of Visits 10   Date for PT Re-Evaluation 03/26/16   Authorization Type Kx modifier at visit 15   PT Start Time [id number]   PT Stop Time 1538   PT Time Calculation (min) 53 min   Activity Tolerance Patient tolerated treatment well      Past Medical History:  Diagnosis Date  . Chicken pox   . Depression   . Diabetes mellitus without complication ([place])   . GERD (gastroesophageal reflux disease)   . Hyperlipidemia   . Hypertension   . Stroke ([place])   . UTI (urinary tract infection)     Past Surgical History:  Procedure Laterality Date  . ABDOMINAL HYSTERECTOMY    . APPENDECTOMY    . BREAST SURGERY     biopsy    There were no vitals filed for this visit.      Subjective Assessment - 02/16/16 1448    Subjective Patient has no complaints.  The last couple of days have been bad with standing on feet too long.  Standing 10-15 min and my back has had it.  Sometimes I have to take an [place].  My balance is a lot better.     Currently in Pain? Yes   Pain Score 5    Pain Location Back   Pain Type Chronic pain   Aggravating Factors  standing   Pain Relieving Factors sitting, resting                         [facility] Adult PT Treatment/Exercise - 02/16/16 0001      Lumbar Exercises: Seated   Sit to Stand Limitations 2# red plyoball hip to hip, hip to shoulder, Vs and ear to ear 20 sec each     Lumbar Exercises: Supine   Bridge 10 reps   Bridge Limitations LEs on red physioball    Isometric Hip Flexion 10 reps   Isometric Hip Flexion Limitations with LEs on red  ball    Other Supine Lumbar Exercises ab brace with double and single limb clams 10x     Knee/Hip Exercises: Aerobic   Nustep Nustep level 3 x 6 min  Therapist present to discuss treatment     Knee/Hip Exercises: Standing   Other Standing Knee Exercises dynamic gait walking around and over obstacles with head turns     Knee/Hip Exercises: Seated   Other Seated Knee/Hip Exercises foam roll push downs 10x   Sit to Sand 10 reps     Moist Heat Therapy   Number Minutes Moist Heat 15 Minutes   Moist Heat Location Lumbar Spine     Electrical Stimulation   Electrical Stimulation Location bilateral paraspinals   Electrical Stimulation Action IFC   Electrical Stimulation Parameters 15 ma 15 min supine   Electrical Stimulation Goals Pain                  PT Short Term Goals - 02/16/16 1530      PT SHORT TERM GOAL #1   Title Pt will be independent with her HEP  Status Achieved     PT SHORT TERM GOAL #2   Title Pt will improve her exercise tolerance to 30 minutes with no rest break.    Status Achieved     PT SHORT TERM GOAL #3   Title Pt will be able to demonstrate correct step through gait pattern with no scissoring or decresed foot clearence on the left LE to improve safety.    Status Achieved           PT Long Term Goals - 02/16/16 1530      PT LONG TERM GOAL #1   Title pt will improve her BERG score from 46 to 52 to improve functional mobility and dynamic balance safety.   Time 8   Period Weeks   Status On-going     PT LONG TERM GOAL #2   Title Pt will have reported no falls in the last 4 weeks.    Time 8   Period Weeks   Status On-going     PT LONG TERM GOAL #3   Title Pt will improve bilateral LE strength to grossly 4/5 bilaterally in order to improve gait.    Time 8   Period Weeks   Status On-going               Plan - 02/16/16 1526    Clinical Impression Statement The patient reports mid to low back pain with standing with functional  activities at home, relieved with sitting and lying down.  She does report significant improvement in her balance.  In the clinic, she has altered balance with eyes closed and with head movements.  She does have an increase in back pain with standing/walking but relieved with e-stim/heat.  She would greatly benefit from a home TENS unit for pain control.   She plans on asking her MD for a prescription for a TENS.   Therapist closely modifying response with all treatment and modifying as needed.     PT Next Visit Plan core strength/stabilization;  balance ex;  e-stim/heat for pain control      Patient will benefit from skilled therapeutic intervention in order to improve the following deficits and impairments:     Visit Diagnosis: Muscle weakness (generalized)  Acute midline low back pain without sciatica  Other abnormalities of gait and mobility  Gait instability     Problem List Patient Active Problem List   Diagnosis Date Noted  . CVA (cerebral vascular accident) (HCC)-No residual deficit 02/12/2016  . Glaucoma 02/12/2016  . Depression, major, recurrent ([place]) 02/12/2016  . Dyslipidemia 02/12/2016  . OSA (obstructive sleep apnea) 02/12/2016  . Vitamin D deficiency 01/14/2016  . DM (diabetes mellitus), type 2 with neurological complications ([place]) 99991111  . Essential hypertension 01/14/2016  . Peripheral neuropathy ([place]) 01/14/2016   Ruben Im, PT 02/16/16 3:33 PM Phone: [phone] Fax: [phone]  Alvera Singh 02/16/2016, 3:32 PM  [facility] [address] [place], [place], [address] Phone: [phone]   Fax:  [phone]  Name: Alexa Brown MRN: [id number] Date of Birth: Apr 04, 1940

## 2016-02-23 ENCOUNTER — Ambulatory Visit: Payer: Medicare Other | Admitting: Physical Therapy

## 2016-02-23 DIAGNOSIS — R2689 Other abnormalities of gait and mobility: Secondary | ICD-10-CM

## 2016-02-23 DIAGNOSIS — M545 Low back pain, unspecified: Secondary | ICD-10-CM

## 2016-02-23 DIAGNOSIS — R2681 Unsteadiness on feet: Secondary | ICD-10-CM

## 2016-02-23 DIAGNOSIS — M6281 Muscle weakness (generalized): Secondary | ICD-10-CM

## 2016-02-23 NOTE — Therapy (Signed)
[facility] [address] [place], [place], [address] Phone: [phone]   Fax:  [phone]  Physical Therapy Treatment  Patient Details  Name: Alexa Brown MRN: [id number] Date of Birth: 06-10-1940 Referring Provider: Telisa Martinique MD  Encounter Date: 02/23/2016      PT End of Session - 02/23/16 1527    Visit Number 7   Number of Visits 10   Date for PT Re-Evaluation 03/26/16   Authorization Type Kx modifier at visit 15   PT Start Time [id number]   PT Stop Time 1540   PT Time Calculation (min) 55 min   Activity Tolerance Patient tolerated treatment well      Past Medical History:  Diagnosis Date  . Chicken pox   . Depression   . Diabetes mellitus without complication ([place])   . GERD (gastroesophageal reflux disease)   . Hyperlipidemia   . Hypertension   . Stroke ([place])   . UTI (urinary tract infection)     Past Surgical History:  Procedure Laterality Date  . ABDOMINAL HYSTERECTOMY    . APPENDECTOMY    . BREAST SURGERY     biopsy    There were no vitals filed for this visit.      Subjective Assessment - 02/23/16 1449    Subjective My back has not been hurting the last 2 days.  I did try to stand in a chair to reach higher shelf and I almost fell.  Patient states she rode her 3 wheeled recumbent bike for 1 mile in her neighborhood without difficulty.     Currently in Pain? No/denies while sitting but comes on quickly in standing   Pain Score 0-No pain (while sitting)   Pain Location Back   Aggravating Factors  standing   Pain Relieving Factors sitting, resting                         [facility] Adult PT Treatment/Exercise - 02/23/16 0001      Lumbar Exercises: Supine   Ab Set 10 reps   Bent Knee Raise 10 reps   Bridge 10 reps   Bridge Limitations LEs on red physioball    Isometric Hip Flexion 10 reps   Other Supine Lumbar Exercises ab brace with double and single limb clams  10x     Knee/Hip Exercises: Aerobic   Nustep Nustep level 3 x 6 min  Therapist present to discuss treatment     Knee/Hip Exercises: Standing   Lateral Step Up Right;Left;1 set;5 reps;Hand Hold: 1;Step Height: 6"   Lateral Step Up Limitations opposite hip abduction   Forward Step Up Right;Left;1 set;5 reps;Hand Hold: 1;Step Height: 6"   Forward Step Up Limitations opposite foot chair touch   Walking with Sports Cord 25# backward and forward 6x each     Moist Heat Therapy   Number Minutes Moist Heat 15 Minutes   Moist Heat Location Lumbar Spine     Electrical Stimulation   Electrical Stimulation Location bilateral paraspinals   Electrical Stimulation Action IFC   Electrical Stimulation Parameters 15 ma 15 min supine   Electrical Stimulation Goals Pain                  PT Short Term Goals - 02/23/16 1926      PT SHORT TERM GOAL #1   Title Pt will be independent with her HEP   Status Achieved     PT SHORT TERM GOAL #  2   Title Pt will improve her exercise tolerance to 30 minutes with no rest break.    Status Achieved     PT SHORT TERM GOAL #3   Title Pt will be able to demonstrate correct step through gait pattern with no scissoring or decresed foot clearence on the left LE to improve safety.    Status Achieved           PT Long Term Goals - 02/23/16 1926      PT LONG TERM GOAL #1   Title pt will improve her BERG score from 46 to 52 to improve functional mobility and dynamic balance safety.   Time 8   Period Weeks   Status On-going     PT LONG TERM GOAL #2   Title Pt will have reported no falls in the last 4 weeks.    Time 8   Period Weeks   Status On-going     PT LONG TERM GOAL #3   Title Pt will improve bilateral LE strength to grossly 4/5 bilaterally in order to improve gait.    Time 8   Period Weeks   Status On-going               Plan - 02/23/16 1911    Clinical Impression Statement The patient reports an improvement in back pain and  balance although we discussed that she should not try standing on a chair to reach higher shelves secondary to her neuropathy and higher risk of falls.  She is able to participate in intermediate level core strengthening but does need verbal cues to avoid holding her breath.  She is able to participate in a progression of standing core, LE strengthening and dynamic balance ex without an exacerbation of her back pain.  Therapist closely monitoring response to all interventions.     PT Next Visit Plan sees Dr. Martinique after next visit (sent progress note);  patient plans to ask dr. for home TENS prescription;  continue core strengthening and balance ex      Patient will benefit from skilled therapeutic intervention in order to improve the following deficits and impairments:     Visit Diagnosis: Muscle weakness (generalized)  Acute midline low back pain without sciatica  Other abnormalities of gait and mobility  Gait instability     Problem List Patient Active Problem List   Diagnosis Date Noted  . CVA (cerebral vascular accident) (HCC)-No residual deficit 02/12/2016  . Glaucoma 02/12/2016  . Depression, major, recurrent ([place]) 02/12/2016  . Dyslipidemia 02/12/2016  . OSA (obstructive sleep apnea) 02/12/2016  . Vitamin D deficiency 01/14/2016  . DM (diabetes mellitus), type 2 with neurological complications ([place]) [id number]  . Essential hypertension 01/14/2016  . Peripheral neuropathy ([place]) 01/14/2016   Ruben Im, PT 02/23/16 7:29 PM Phone: [phone] Fax: [phone]  Alvera Singh 02/23/2016, 7:27 PM  [facility] [address] [place], [place], [address] Phone: [phone]   Fax:  [phone]  Name: Alexa Brown MRN: [id number] Date of Birth: 1941/02/03

## 2016-02-25 ENCOUNTER — Ambulatory Visit: Payer: Medicare Other | Admitting: Physical Therapy

## 2016-02-25 ENCOUNTER — Ambulatory Visit (INDEPENDENT_AMBULATORY_CARE_PROVIDER_SITE_OTHER): Payer: Medicare Other | Admitting: Family Medicine

## 2016-02-25 ENCOUNTER — Encounter: Payer: Self-pay | Admitting: Family Medicine

## 2016-02-25 VITALS — BP 130/80 | HR 76 | Resp 12 | Ht 65.5 in | Wt 160.5 lb

## 2016-02-25 DIAGNOSIS — E1149 Type 2 diabetes mellitus with other diabetic neurological complication: Secondary | ICD-10-CM | POA: Diagnosis not present

## 2016-02-25 DIAGNOSIS — R2681 Unsteadiness on feet: Secondary | ICD-10-CM

## 2016-02-25 DIAGNOSIS — M6281 Muscle weakness (generalized): Secondary | ICD-10-CM | POA: Diagnosis not present

## 2016-02-25 DIAGNOSIS — K219 Gastro-esophageal reflux disease without esophagitis: Secondary | ICD-10-CM

## 2016-02-25 DIAGNOSIS — R2689 Other abnormalities of gait and mobility: Secondary | ICD-10-CM

## 2016-02-25 DIAGNOSIS — Z1239 Encounter for other screening for malignant neoplasm of breast: Secondary | ICD-10-CM

## 2016-02-25 DIAGNOSIS — G4733 Obstructive sleep apnea (adult) (pediatric): Secondary | ICD-10-CM | POA: Diagnosis not present

## 2016-02-25 DIAGNOSIS — F3341 Major depressive disorder, recurrent, in partial remission: Secondary | ICD-10-CM | POA: Diagnosis not present

## 2016-02-25 DIAGNOSIS — Z1231 Encounter for screening mammogram for malignant neoplasm of breast: Secondary | ICD-10-CM

## 2016-02-25 DIAGNOSIS — M545 Low back pain, unspecified: Secondary | ICD-10-CM

## 2016-02-25 MED ORDER — PANTOPRAZOLE SODIUM 40 MG PO TBEC: 40.0000 mg | DELAYED_RELEASE_TABLET | Freq: Every day | ORAL | 1 refills | Status: DC

## 2016-02-25 MED ORDER — BUPROPION HCL ER (XL) 300 MG PO TB24: 300.0000 mg | ORAL_TABLET | Freq: Every day | ORAL | 2 refills | Status: DC

## 2016-02-25 NOTE — Progress Notes (Signed)
HPI:   Alexa Brown is a 75 y.o. female, who is here today to follow on some of her chronic medical problems.   She has history of diabetic neuropathy and depression, currently she is on Wellbutrin XL 300 mg daily and Cymbalta 60 mg daily. I last saw her 01/14/2016, when Cymbalta was decreased from 120 mg daily to alternating doses between 120 mg and 60 mg every other day, she tolerated well so decided to decrease it to 60 mg daily. She denies any changes in mood, headaches, or tremor. She feels the medication is helping.   Her husband is currently being treated for pancreatic cancer, so she is under some stress but seems like she is dealing well with situation.  She also has history of OA, mainly LE and back as well as frequent falls.She is currently doing PT, which has helped greatly. She wonders if I can give her a prescription for a TENS unit as the one she is using after PT sections.  Balance has improved, using her cane, and no falls since her last OV.   -She is also due for her mammogram, last one October 2016. She has history of abnormal mammograms, reporting negative biopsie.  -DM 2: She discontinue Invokana as recommended, polydipsia and dry mouth resolved. BS 120-130s, she is trying to follow a healthy diet. + Burning sensation on feet , stable.   GERD: She has not been taking Protonix 40 mg twice daily because ran out of prescription, she has noted intermittent heartburn. She is reporting that dose was increased a few months ago because noted inflammatory changes on upper esophagus. Symptoms are exacerbated by certain food, so she tries to avoid trigger factors.  Denies abdominal pain, nausea, vomiting, changes in bowel habits, blood in stool or melena.   OSA: She has not been wearing CPAP since she moved to the area. She has not noted fatigue.     Review of Systems  Constitutional: Negative for appetite change, fatigue and fever.  HENT:  Negative for mouth sores, nosebleeds, sore throat, trouble swallowing and voice change.   Respiratory: Negative for cough, shortness of breath and wheezing.   Cardiovascular: Negative for chest pain, palpitations and leg swelling.  Gastrointestinal: Negative for abdominal pain, nausea and vomiting.       Negative for changes in bowel habits.  Genitourinary: Negative for decreased urine volume and hematuria.  Musculoskeletal: Positive for arthralgias and gait problem.  Neurological: Negative for syncope, weakness and headaches.  Psychiatric/Behavioral: Negative for confusion. The patient is not nervous/anxious.       Current Outpatient Prescriptions on File Prior to Visit  Medication Sig Dispense Refill  . allopurinol (ZYLOPRIM) 100 MG tablet Take 100 mg by mouth 3 (three) times daily.    Marland Kitchen aspirin EC 81 MG tablet Take 81 mg by mouth daily.    Marland Kitchen atorvastatin (LIPITOR) 40 MG tablet Take 40 mg by mouth daily.    . benazepril (LOTENSIN) 20 MG tablet Take 20 mg by mouth daily.    . benazepril-hydrochlorthiazide (LOTENSIN HCT) 20-12.5 MG tablet Take 1 tablet by mouth daily.    Marland Kitchen buPROPion (WELLBUTRIN XL) 300 MG 24 hr tablet Take 1 tablet (300 mg total) by mouth daily. 30 tablet 0  . Cholecalciferol (VITAMIN D3) 5000 units CAPS Take 1 capsule by mouth daily.    . clopidogrel (PLAVIX) 75 MG tablet Take 75 mg by mouth daily.    . DULoxetine (CYMBALTA) 60 MG capsule Take 60  mg by mouth daily.     Marland Kitchen L-Lysine 500 MG CAPS Take 1 capsule by mouth daily.    Marland Kitchen latanoprost (XALATAN) 0.005 % ophthalmic solution Place 1 drop into both eyes at bedtime.    . triamcinolone cream (KENALOG) 0.5 % Apply 1 application topically 2 (two) times daily.     No current facility-administered medications on file prior to visit.      Past Medical History:  Diagnosis Date  . Chicken pox   . Depression   . Diabetes mellitus without complication ([place])   . GERD (gastroesophageal reflux disease)   . Hyperlipidemia     . Hypertension   . Stroke ([place])   . UTI (urinary tract infection)    No Known Allergies  Social History   Social History  . Marital status: Married    Spouse name: N/A  . Number of children: N/A  . Years of education: N/A   Social History Main Topics  . Smoking status: Never Smoker  . Smokeless tobacco: Never Used  . Alcohol use No  . Drug use: No  . Sexual activity: Not Currently   Other Topics Concern  . None   Social History Narrative  . None    Vitals:   02/25/16 1554  BP: 130/80  Pulse: 76  Resp: 12     Body mass index is 26.3 kg/m.    Physical Exam  Nursing note and vitals reviewed. Constitutional: She is oriented to person, place, and time. She appears well-developed and well-nourished. No distress.  HENT:  Head: Atraumatic.  Mouth/Throat: Oropharynx is clear and moist and mucous membranes are normal.  Eyes: Conjunctivae and EOM are normal.  Cardiovascular: Normal rate and regular rhythm.   No murmur heard. Respiratory: Effort normal and breath sounds normal. No respiratory distress.  GI: Soft. There is no tenderness.  Musculoskeletal: She exhibits no edema or tenderness.  Neurological: She is alert and oriented to person, place, and time. She has normal strength. Coordination normal.  Stable gait assisted with cane.  Skin: Skin is warm. No erythema.  Psychiatric: She has a normal mood and affect. Cognition and memory are normal.  Well groomed, good eye contact.      ASSESSMENT AND PLAN:     Deborha was seen today for follow-up.  Diagnoses and all orders for this visit:   Gastroesophageal reflux disease, esophagitis presence not specified  Symptomatic. GERD precautions discussed. She will resume Protonix, she agrees with trying 40 mg daily, symptoms still not well controlled she may need to go back to twice daily. We discussed some side effects of PPIs. Follow-up in 3 months.  -     pantoprazole (PROTONIX) 40 MG tablet; Take 1  tablet (40 mg total) by mouth daily.  OSA (obstructive sleep apnea)  We discussed some adverse effects of OSA. Appointment with pulmonologists will be arranged.  -     Ambulatory referral to Pulmonology  DM (diabetes mellitus), type 2 with neurological complications [facility])  She will continue nonpharmacologic treatment. We discussed the importance of consistency with a healthy diet and regular physical activity as tolerated. Follow-up in 3 months.  Recurrent major depressive disorder, in partial remission (HCC)  Stable. No changes in current management. Follow-up in 3 months.   Breast cancer screening -     MM SCREENING BREAST TOMO BILATERAL; Future   -In regard to TENS unit I explained I am not very familiar with ranges in regard to frequency and intensity of electrical stimulation. Explained that  usually prescription is not needed and physical therapist could give her further recommendations in this regard, including insurance coverage.    -Alexa Brown was advised to return sooner than planned today if new concerns arise.       Shaima G. Martinique, MD  [facility]. [place] office.

## 2016-02-25 NOTE — Therapy (Signed)
[facility] [address], [place], [place], [address] Phone: [phone]   Fax:  [phone]  Physical Therapy Treatment  Patient Details  Name: Alexa Brown MRN: [id number] Date of Birth: [birth year] Referring Provider: Amber Martinique MD  Encounter Date: 02/25/2016      PT End of Session - 02/25/16 1409    Visit Number 8   Number of Visits 10   Date for PT Re-Evaluation 03/26/16   Authorization Type Kx modifier at visit 15   PT Start Time 1400   PT Stop Time [id number]   PT Time Calculation (min) 53 min   Activity Tolerance Patient tolerated treatment well      Past Medical History:  Diagnosis Date  . Chicken pox   . Depression   . Diabetes mellitus without complication ([place])   . GERD (gastroesophageal reflux disease)   . Hyperlipidemia   . Hypertension   . Stroke ([place])   . UTI (urinary tract infection)     Past Surgical History:  Procedure Laterality Date  . ABDOMINAL HYSTERECTOMY    . APPENDECTOMY    . BREAST SURGERY     biopsy    There were no vitals filed for this visit.      Subjective Assessment - 02/25/16 1404    Subjective Had some back pain while putting on make up today (standing)  but it went away on the way here.  Rode 3 wheeled recumbent bike over 1 mile yesterday without difficulty.  My balance feels a little off but no major bad episodes.      Currently in Pain? No/denies   Pain Score 0-No pain   Pain Location Back   Pain Type Chronic pain                         [facility] Adult PT Treatment/Exercise - 02/25/16 0001      Lumbar Exercises: Stretches   Active Hamstring Stretch 3 reps;30 seconds     Lumbar Exercises: Seated   Sit to Stand 10 reps     Lumbar Exercises: Supine   Bridge 10 reps   Bridge Limitations LEs on red physioball    Other Supine Lumbar Exercises dead bug 10x   Other Supine Lumbar Exercises bridge with clams 10x     Lumbar Exercises:  Quadruped   Single Arm Raise Right;Left;5 reps   Straight Leg Raise 5 reps   Opposite Arm/Leg Raise Right arm/Left leg;Left arm/Right leg;5 reps     Knee/Hip Exercises: Aerobic   Nustep Nustep level 3 x 6 min  Therapist present to discuss treatment     Knee/Hip Exercises: Standing   Walking with Sports Cord 25# backward and forward 6x each   Gait Training 15# resisted sidestepping   3xeach   Other Standing Knee Exercises SLS on right/left with green band opposite arm pull downs 10x     Moist Heat Therapy   Number Minutes Moist Heat 15 Minutes   Moist Heat Location Lumbar Spine     Electrical Stimulation   Electrical Stimulation Location bilateral paraspinals   Electrical Stimulation Action IFC   Electrical Stimulation Parameters 15 ma 15 min supine   Electrical Stimulation Goals Pain                PT Education - 02/25/16 1449    Education provided Yes   Education Details quadruped series   Person(s) Educated Patient   Methods Explanation;Demonstration;Handout   Comprehension  Verbalized understanding;Returned demonstration          PT Short Term Goals - 02/25/16 1448      PT SHORT TERM GOAL #1   Title Pt will be independent with her HEP   Status Achieved     PT SHORT TERM GOAL #2   Title Pt will improve her exercise tolerance to 30 minutes with no rest break.    Status Achieved     PT SHORT TERM GOAL #3   Title Pt will be able to demonstrate correct step through gait pattern with no scissoring or decresed foot clearence on the left LE to improve safety.    Status Achieved           PT Long Term Goals - 02/25/16 1407      PT LONG TERM GOAL #1   Title pt will improve her BERG score from 46 to 52 to improve functional mobility and dynamic balance safety.   Time 8   Period Weeks   Status On-going     PT LONG TERM GOAL #2   Title Pt will have reported no falls in the last 4 weeks.    Time 8   Period Weeks     PT LONG TERM GOAL #3   Title Pt  will improve bilateral LE strength to grossly 4/5 bilaterally in order to improve gait.    Time 8   Period Weeks   Status On-going               Plan - 02/25/16 1425    Clinical Impression Statement Patient reports being at least 50% better overall.  She still gets back pain with standing in one spot for a period of time (3-4 min) with max 10 minutes.  She has decreased balance with head movements and without vision.  Low to moderate risk of falls.  Left hip weakness is evident.  She is highly motivated with treatment.  Able to stand longer for dynamic balance and strengthening with minimal back pain production.  Therapist closely monitoring pain throughout treatment session.     PT Next Visit Plan see how appt went with Dr. Martinique;  continue core and balance ex;  progress standing tolerance as tolerated;  e-stim for pain control as needed      Patient will benefit from skilled therapeutic intervention in order to improve the following deficits and impairments:     Visit Diagnosis: Muscle weakness (generalized)  Acute midline low back pain without sciatica  Other abnormalities of gait and mobility  Gait instability     Problem List Patient Active Problem List   Diagnosis Date Noted  . CVA (cerebral vascular accident) (HCC)-No residual deficit 02/12/2016  . Glaucoma 02/12/2016  . Depression, major, recurrent ([place]) 02/12/2016  . Dyslipidemia 02/12/2016  . OSA (obstructive sleep apnea) 02/12/2016  . Vitamin D deficiency 01/14/2016  . DM (diabetes mellitus), type 2 with neurological complications ([place]) 99991111  . Essential hypertension 01/14/2016  . Peripheral neuropathy ([place]) 01/14/2016   Ruben Im, PT 02/25/16 2:50 PM Phone: [phone] Fax: [phone]  Alvera Singh 02/25/2016, 2:50 PM  [facility] [address], [place], [place], [address] Phone: [phone]   Fax:   [phone]  Name: Alexa Brown MRN: [id number] Date of Birth: 1940-08-26

## 2016-02-25 NOTE — Progress Notes (Signed)
Pre visit review using our clinic review tool, if applicable. No additional management support is needed unless otherwise documented below in the visit note. 

## 2016-02-25 NOTE — Patient Instructions (Signed)
   Bracing With Arm Raise (Quadruped)  On hands and knees find neutral spine. Tighten pelvic floor and abdominals and hold. Alternately lift arm to shoulder level. Repeat __5_ times. Do _1__ times a day.  Quadruped Alternate Hip Extension   Shift weight to one side and raise opposite leg. Keep trunk steady. _5__ reps per set, __1_ sets per day, __7_ days per week Repeat with other leg.  Bracing With Arm / Leg Raise (Quadruped)  On hands and knees find neutral spine. Tighten pelvic floor and abdominals and hold. Alternating, lift arm to shoulder level and opposite leg to hip level. Repeat __5_ times. Do __1_ times a day.    Stacy Simpson PT [facility] [address], [address] [place], [place] [address] Phone # [phone] Fax [phone] 

## 2016-02-25 NOTE — Patient Instructions (Signed)
A few things to remember from today's visit:   OSA (obstructive sleep apnea) - Plan: Ambulatory referral to Pulmonology  DM (diabetes mellitus), type 2 with neurological complications ([place])  Recurrent major depressive disorder, in partial remission ([place])  No changes in medications today.   Please be sure medication list is accurate. If a new problem present, please set up appointment sooner than planned today.

## 2016-02-29 ENCOUNTER — Telehealth: Payer: Self-pay

## 2016-02-29 NOTE — Telephone Encounter (Signed)
Received refill request for:  Clopidogrel 75 mg tablets.  Okay to refill? We haven't filled it before.

## 2016-03-01 ENCOUNTER — Ambulatory Visit: Payer: Medicare Other | Attending: Family Medicine | Admitting: Physical Therapy

## 2016-03-01 DIAGNOSIS — M545 Low back pain, unspecified: Secondary | ICD-10-CM

## 2016-03-01 DIAGNOSIS — M6281 Muscle weakness (generalized): Secondary | ICD-10-CM | POA: Diagnosis present

## 2016-03-01 DIAGNOSIS — R2689 Other abnormalities of gait and mobility: Secondary | ICD-10-CM | POA: Diagnosis present

## 2016-03-01 DIAGNOSIS — R2681 Unsteadiness on feet: Secondary | ICD-10-CM | POA: Diagnosis present

## 2016-03-01 MED ORDER — DULOXETINE HCL 60 MG PO CPEP: 60.0000 mg | ORAL_CAPSULE | Freq: Every day | ORAL | 1 refills | Status: DC

## 2016-03-01 MED ORDER — CLOPIDOGREL BISULFATE 75 MG PO TABS: 75.0000 mg | ORAL_TABLET | Freq: Every day | ORAL | 0 refills | Status: DC

## 2016-03-01 NOTE — Telephone Encounter (Signed)
It is OK to refill both, Cymbalta 3 months supply (#90/1), Plavix # 90/0. Thanks, BJ

## 2016-03-01 NOTE — Telephone Encounter (Signed)
Pt need new Rx duloxetine  Pharm:  [facility] [employer]

## 2016-03-01 NOTE — Therapy (Signed)
[facility] [address], [place], [place], [address] Phone: [phone]   Fax:  [phone]  Physical Therapy Treatment  Patient Details  Name: Alexa Brown MRN: [id number] Date of Birth: 25-Nov-1940 Referring Provider: Laconda Martinique MD  Encounter Date: 03/01/2016      PT End of Session - 03/01/16 1734    Visit Number 9   Number of Visits 10   Date for PT Re-Evaluation 03/26/16   Authorization Type Kx modifier at visit 15   PT Start Time [id number]   PT Stop Time 1545   PT Time Calculation (min) 60 min   Activity Tolerance Patient tolerated treatment well      Past Medical History:  Diagnosis Date  . Chicken pox   . Depression   . Diabetes mellitus without complication ([place])   . GERD (gastroesophageal reflux disease)   . Hyperlipidemia   . Hypertension   . Stroke ([place])   . UTI (urinary tract infection)     Past Surgical History:  Procedure Laterality Date  . ABDOMINAL HYSTERECTOMY    . APPENDECTOMY    . BREAST SURGERY     biopsy    There were no vitals filed for this visit.      Subjective Assessment - 03/01/16 1448    Subjective My back is bothering me now because I mopped this morning.  States her doctor's appointment went fine.  Back pain went away with lying down but returned upon rising.     Currently in Pain? Yes   Pain Score 8    Pain Location Back   Pain Orientation Right;Left;Lower   Pain Type Chronic pain                         [facility] Adult PT Treatment/Exercise - 03/01/16 0001      Lumbar Exercises: Supine   Clam Limitations alternating red band clams and ball squeeze   Bridge 10 reps   Bridge Limitations LEs on red physioball    Isometric Hip Flexion 10 reps   Isometric Hip Flexion Limitations with LEs on red ball    Other Supine Lumbar Exercises decompression series as per previous HEP 5x each right /left    Other Supine Lumbar Exercises red physioball  flexion 10x, small arc rotation 10x     Knee/Hip Exercises: Aerobic   Nustep Nustep level 3 x 6 min  Therapist present to discuss treatment     Knee/Hip Exercises: Standing   Walking with Sports Cord 25# backward and forward 6x each   Gait Training 15# resisted sidestepping   5xeach   Other Standing Knee Exercises hip abduction isometric against wall 5x 5 sec hold right/left   Other Standing Knee Exercises UE wall slides with gluteal squeeze 10x     Knee/Hip Exercises: Seated   Other Seated Knee/Hip Exercises foam roll push downs 10x     Moist Heat Therapy   Number Minutes Moist Heat 15 Minutes   Moist Heat Location Lumbar Spine     Electrical Stimulation   Electrical Stimulation Location bilateral paraspinals   Electrical Stimulation Action IFC   Electrical Stimulation Parameters 15 ma 15 min supine   Electrical Stimulation Goals Pain                  PT Short Term Goals - 03/01/16 1738      PT SHORT TERM GOAL #1   Title Pt will be independent with her  HEP   Status Achieved     PT SHORT TERM GOAL #2   Title Pt will improve her exercise tolerance to 30 minutes with no rest break.    Status Achieved     PT SHORT TERM GOAL #3   Title Pt will be able to demonstrate correct step through gait pattern with no scissoring or decresed foot clearence on the left LE to improve safety.    Status Achieved           PT Long Term Goals - 03/01/16 1738      PT LONG TERM GOAL #1   Title pt will improve her BERG score from 46 to 52 to improve functional mobility and dynamic balance safety.   Time 8   Period Weeks   Status On-going     PT LONG TERM GOAL #2   Title Pt will have reported no falls in the last 4 weeks.    Time 8   Period Weeks   Status On-going     PT LONG TERM GOAL #3   Title Pt will improve bilateral LE strength to grossly 4/5 bilaterally in order to improve gait.    Time 8   Period Weeks   Status On-going               Plan - 03/01/16  1734    Clinical Impression Statement The patient arrives with complaint of severe back pain which she attributes ot mopping this morning.  Initially she moves antalgically with supine to sitting on the mat table and with rolling.  After decompression exercises and low level core strengthening, she reports decreasing back pain and the pain subsided below a 5/10.  She was able to participate in standing balance and strengthening as pain subsided.  Good pain relief with electrical stimulation and heat.  Therapist closely monitoring response with all interventions.  On track to meet remaining goals in next 4-5 visits.     PT Next Visit Plan   G code next visit;  recheck BERG;  continue core and balance ex;  progress standing tolerance as tolerated;  e-stim for pain control as needed      Patient will benefit from skilled therapeutic intervention in order to improve the following deficits and impairments:     Visit Diagnosis: Muscle weakness (generalized)  Acute midline low back pain without sciatica  Other abnormalities of gait and mobility  Gait instability     Problem List Patient Active Problem List   Diagnosis Date Noted  . GERD (gastroesophageal reflux disease) 02/25/2016  . CVA (cerebral vascular accident) (HCC)-No residual deficit 02/12/2016  . Glaucoma 02/12/2016  . Depression, major, recurrent ([place]) 02/12/2016  . Dyslipidemia 02/12/2016  . OSA (obstructive sleep apnea) 02/12/2016  . Vitamin D deficiency 01/14/2016  . DM (diabetes mellitus), type 2 with neurological complications ([place]) [id number]  . Essential hypertension 01/14/2016  . Peripheral neuropathy ([place]) 01/14/2016   Ruben Im, PT 03/01/16 5:41 PM Phone: [phone] Fax: [phone]  Alvera Singh 03/01/2016, 5:40 PM  [facility] [address] [place], [place], [address] Phone: [phone]   Fax:  [phone]  Name: Alexa Brown MRN: [id number] Date of Birth: 08-25-1940

## 2016-03-01 NOTE — Telephone Encounter (Signed)
Both rx's sent

## 2016-03-01 NOTE — Telephone Encounter (Signed)
Okay to refill both.

## 2016-03-03 ENCOUNTER — Ambulatory Visit: Payer: Medicare Other | Admitting: Physical Therapy

## 2016-03-03 DIAGNOSIS — R2681 Unsteadiness on feet: Secondary | ICD-10-CM

## 2016-03-03 DIAGNOSIS — M6281 Muscle weakness (generalized): Secondary | ICD-10-CM | POA: Diagnosis not present

## 2016-03-03 DIAGNOSIS — M545 Low back pain, unspecified: Secondary | ICD-10-CM

## 2016-03-03 DIAGNOSIS — R2689 Other abnormalities of gait and mobility: Secondary | ICD-10-CM

## 2016-03-03 NOTE — Therapy (Signed)
[facility] [address] [place], [place], [address] Phone: [phone]   Fax:  [phone]  Physical Therapy Treatment  Patient Details  Name: Alexa Brown MRN: [id number] Date of Birth: February 11, 1941 Referring Provider: Jalaya Martinique MD  Encounter Date: 03/03/2016      PT End of Session - 03/03/16 1524    Visit Number 10   Number of Visits 20   Date for PT Re-Evaluation 03/26/16   Authorization Type Kx modifier at visit 15   PT Start Time [id number]   PT Stop Time 1536   PT Time Calculation (min) 49 min   Activity Tolerance Patient tolerated treatment well      Past Medical History:  Diagnosis Date  . Chicken pox   . Depression   . Diabetes mellitus without complication ([place])   . GERD (gastroesophageal reflux disease)   . Hyperlipidemia   . Hypertension   . Stroke ([place])   . UTI (urinary tract infection)     Past Surgical History:  Procedure Laterality Date  . ABDOMINAL HYSTERECTOMY    . APPENDECTOMY    . BREAST SURGERY     biopsy    There were no vitals filed for this visit.      Subjective Assessment - 03/03/16 1449    Subjective My back is still hurting because of the mopping the other day.  States last session she felt better after therapy but then the pain came back some.     Currently in Pain? Yes   Pain Score 4    Pain Location Back   Pain Orientation Lower;Right;Left   Pain Type Chronic pain            [facility] PT Assessment - 03/03/16 0001      Berg Balance Test   Sit to Stand Able to stand without using hands and stabilize independently   Standing Unsupported Able to stand safely 2 minutes   Sitting with Back Unsupported but Feet Supported on Floor or Stool Able to sit safely and securely 2 minutes   Stand to Sit Sits safely with minimal use of hands   Transfers Able to transfer safely, minor use of hands   Standing Unsupported with Eyes Closed Able to stand 10 seconds safely    Standing Ubsupported with Feet Together Able to place feet together independently and stand 1 minute safely   From Standing, Reach Forward with Outstretched Arm Can reach confidently >25 cm (10")   From Standing Position, Pick up Object from Floor Able to pick up shoe safely and easily   From Standing Position, Turn to Look Behind Over each Shoulder Looks behind from both sides and weight shifts well   Turn 360 Degrees Able to turn 360 degrees safely in 4 seconds or less   Standing Unsupported, Alternately Place Feet on Step/Stool Able to stand independently and complete 8 steps >20 seconds   Standing Unsupported, One Foot in Front Able to plae foot ahead of the other independently and hold 30 seconds   Standing on One Leg Able to lift leg independently and hold 5-10 seconds   Total Score 53                     [facility] Adult PT Treatment/Exercise - 03/03/16 0001      Lumbar Exercises: Seated   Sit to Stand Limitations 2# red plyoball hip to hip, hip to shoulder, Vs and ear to ear 20 sec each  Lumbar Exercises: Supine   Clam Limitations alternating red band clams and ball squeeze   Bridge 10 reps   Bridge Limitations LEs on red physioball    Isometric Hip Flexion 10 reps   Isometric Hip Flexion Limitations with LEs on red ball    Other Supine Lumbar Exercises isometric hip extension 5x 5 sec hold right/left   Other Supine Lumbar Exercises red physioball flexion 10x, small arc rotation 10x     Knee/Hip Exercises: Standing   Other Standing Knee Exercises red band hip extension and abduction 10x each     Moist Heat Therapy   Number Minutes Moist Heat 15 Minutes   Moist Heat Location Lumbar Spine     Electrical Stimulation   Electrical Stimulation Location bilateral paraspinals   Electrical Stimulation Action IFC   Electrical Stimulation Parameters 17 ma 15 min supine   Electrical Stimulation Goals Pain                  PT Short Term Goals - 2016-03-20 1529       PT SHORT TERM GOAL #1   Title Pt will be independent with her HEP   Status Achieved     PT SHORT TERM GOAL #2   Title Pt will improve her exercise tolerance to 30 minutes with no rest break.    Status Achieved     PT SHORT TERM GOAL #3   Title Pt will be able to demonstrate correct step through gait pattern with no scissoring or decresed foot clearence on the left LE to improve safety.    Status Achieved           PT Long Term Goals - 2016-03-20 1516      PT LONG TERM GOAL #1   Title pt will improve her BERG score from 46 to 52 to improve functional mobility and dynamic balance safety.   Status Achieved     PT LONG TERM GOAL #2   Title Pt will have reported no falls in the last 4 weeks.    Time 8   Period Weeks   Status On-going     PT LONG TERM GOAL #3   Title Pt will improve bilateral LE strength to grossly 4/5 bilaterally in order to improve gait.    Time 8   Period Weeks   Status On-going               Plan - 20-Mar-2016 1526    Clinical Impression Statement The patient has made significant improvement in BERG balance score to 53/56 indicating low risk of falls.  Her pain intensity has been elevated this week secondary to mopping on Tuesday.  Typically she feels better with exercise and modalities.  She should meet remaining goals in 2-3 visits.  Therapist closely monitoring response with treatment and modifying as needed secondary to pain.     PT Next Visit Plan    continue core and balance ex;  progress standing tolerance as tolerated;  e-stim for pain control as needed;   promote independence with HEP      Patient will benefit from skilled therapeutic intervention in order to improve the following deficits and impairments:     Visit Diagnosis: Muscle weakness (generalized)  Acute midline low back pain without sciatica  Other abnormalities of gait and mobility  Gait instability       G-Codes - 03-20-2016 1525    Functional Assessment Tool Used  BERG, clinical assessment   Functional Limitation Mobility: Walking and  moving around   Mobility: Walking and Moving Around Current Status [phone]) At least 20 percent but less than 40 percent impaired, limited or restricted   Mobility: Walking and Moving Around Goal Status [phone]) At least 20 percent but less than 40 percent impaired, limited or restricted      Problem List Patient Active Problem List   Diagnosis Date Noted  . GERD (gastroesophageal reflux disease) 02/25/2016  . CVA (cerebral vascular accident) (HCC)-No residual deficit 02/12/2016  . Glaucoma 02/12/2016  . Depression, major, recurrent ([place]) 02/12/2016  . Dyslipidemia 02/12/2016  . OSA (obstructive sleep apnea) 02/12/2016  . Vitamin D deficiency 01/14/2016  . DM (diabetes mellitus), type 2 with neurological complications ([place]) [id number]  . Essential hypertension 01/14/2016  . Peripheral neuropathy ([place]) 01/14/2016   Ruben Im, PT 03/03/16 5:10 PM Phone: [phone] Fax: [phone] Alvera Singh 03/03/2016, 5:10 PM  [facility] [address], [place], [place], [address] Phone: [phone]   Fax:  [phone]  Name: LASHONA HATTABAUGH MRN: [id number] Date of Birth: 01/20/1941

## 2016-03-07 ENCOUNTER — Other Ambulatory Visit: Payer: Self-pay

## 2016-03-07 MED ORDER — BENAZEPRIL-HYDROCHLOROTHIAZIDE 20-12.5 MG PO TABS: ORAL_TABLET | ORAL | 1 refills | Status: DC

## 2016-03-08 ENCOUNTER — Ambulatory Visit: Payer: Medicare Other | Admitting: Physical Therapy

## 2016-03-08 ENCOUNTER — Encounter: Payer: Self-pay | Admitting: Physical Therapy

## 2016-03-08 DIAGNOSIS — M6281 Muscle weakness (generalized): Secondary | ICD-10-CM | POA: Diagnosis not present

## 2016-03-08 DIAGNOSIS — M545 Low back pain, unspecified: Secondary | ICD-10-CM

## 2016-03-08 DIAGNOSIS — R2689 Other abnormalities of gait and mobility: Secondary | ICD-10-CM

## 2016-03-08 DIAGNOSIS — R2681 Unsteadiness on feet: Secondary | ICD-10-CM

## 2016-03-08 NOTE — Therapy (Signed)
[facility] [address], [place], [place], [address] Phone: [phone]   Fax:  [phone]  Physical Therapy Treatment  Patient Details  Name: Alexa Brown MRN: [id number] Date of Birth: 18-Feb-1941 Referring Provider: Afreen Martinique MD  Encounter Date: 03/08/2016      PT End of Session - 03/08/16 1440    Visit Number 11   Number of Visits 20   Date for PT Re-Evaluation 03/26/16   Authorization Type Kx modifier at visit 15   PT Start Time 1441   PT Stop Time 1545   PT Time Calculation (min) 64 min   Activity Tolerance Patient tolerated treatment well   Behavior During Therapy [facility] for tasks assessed/performed      Past Medical History:  Diagnosis Date  . Chicken pox   . Depression   . Diabetes mellitus without complication ([place])   . GERD (gastroesophageal reflux disease)   . Hyperlipidemia   . Hypertension   . Stroke ([place])   . UTI (urinary tract infection)     Past Surgical History:  Procedure Laterality Date  . ABDOMINAL HYSTERECTOMY    . APPENDECTOMY    . BREAST SURGERY     biopsy    There were no vitals filed for this visit.      Subjective Assessment - 03/08/16 1445    Subjective I have had some bad moments in the last 2 days.    Limitations Sitting;House hold activities   How long can you sit comfortably? 30 minutes   How long can you stand comfortably? 15 minutes   How long can you walk comfortably? 30 minutes   Patient Stated Goals stop falling, back pain stop hurting   Currently in Pain? Yes   Pain Score 2    Pain Location Back   Pain Orientation Left;Right;Lower   Pain Descriptors / Indicators Tightness;Cramping   Pain Type Chronic pain   Pain Onset 1 to 4 weeks ago   Pain Frequency Intermittent   Aggravating Factors  standing   Pain Relieving Factors sitting, resting   Effect of Pain on Daily Activities limits household activities   Multiple Pain Sites No                          [facility] Adult PT Treatment/Exercise - 03/08/16 0001      Lumbar Exercises: Seated   Sit to Stand Limitations 2# red plyoball hip to hip, hip to shoulder, Vs and ear to ear 20 sec each     Lumbar Exercises: Supine   Clam Limitations alternating red band clams and ball squeeze   Bridge 10 reps   Isometric Hip Flexion 10 reps   Isometric Hip Flexion Limitations with LEs on red ball    Other Supine Lumbar Exercises isometric hip extension 5x 5 sec hold right/left   Other Supine Lumbar Exercises red physioball flexion 10x, small arc rotation 10x     Knee/Hip Exercises: Aerobic   Nustep Nustep level 3 x 6 min seat #9, arms #12  Therapist present to discuss treatment     Knee/Hip Exercises: Standing   Walking with Sports Cord 25# backward and forward 6x each  close contact guard   Gait Training 15# resisted sidestepping   5xeach; close contact guard   Other Standing Knee Exercises red band hip extension and abduction 10x each   Other Standing Knee Exercises UE wall slides with gluteal squeeze 10x  VC to not  arch back     Knee/Hip Exercises: Seated   Other Seated Knee/Hip Exercises foam roll push downs 10x     Modalities   Modalities Electrical Stimulation;Moist Heat     Moist Heat Therapy   Number Minutes Moist Heat 15 Minutes   Moist Heat Location Lumbar Spine     Electrical Stimulation   Electrical Stimulation Location bilateral paraspinals   Electrical Stimulation Action IFC   Electrical Stimulation Parameters to patient tolerance; 15 min   Electrical Stimulation Goals Pain                PT Education - 03/08/16 1527    Education provided No          PT Short Term Goals - 03/03/16 1529      PT SHORT TERM GOAL #1   Title Pt will be independent with her HEP   Status Achieved     PT SHORT TERM GOAL #2   Title Pt will improve her exercise tolerance to 30 minutes with no rest break.    Status Achieved     PT SHORT TERM  GOAL #3   Title Pt will be able to demonstrate correct step through gait pattern with no scissoring or decresed foot clearence on the left LE to improve safety.    Status Achieved           PT Long Term Goals - 03/08/16 1446      PT LONG TERM GOAL #1   Title pt will improve her BERG score from 46 to 52 to improve functional mobility and dynamic balance safety.   Time 8   Period Weeks   Status Achieved     PT LONG TERM GOAL #2   Title Pt will have reported no falls in the last 4 weeks.    Time 8   Period Weeks   Status On-going     PT LONG TERM GOAL #3   Title Pt will improve bilateral LE strength to grossly 4/5 bilaterally in order to improve gait.    Time 8   Period Weeks   Status On-going               Plan - 03/08/16 1440    Clinical Impression Statement During walking with resistance patient needed close contact guard due to losing her balance but able to regain it.  She also lost her balance and able to regain it with rolling red ball up the wall. Patient had no increase pain with HEP. Patient continues to have back pain.  Patient will benefit from skilled therapy to improve balance and reduce painw with supervision.    Rehab Potential Good   Clinical Impairments Affecting Rehab Potential none   PT Frequency 2x / week   PT Duration 8 weeks   PT Treatment/Interventions ADLs/Self Care Home Management;Patient/family education;Energy conservation;Electrical Stimulation;Moist Heat;Ultrasound;Balance training;Therapeutic exercise;Therapeutic activities;Functional mobility training;Stair training;Gait training;Manual techniques;Passive range of motion   PT Next Visit Plan    continue core and balance ex;  progress standing tolerance as tolerated;  e-stim for pain control as needed;   promote independence with HEP   PT Home Exercise Plan progress as needed   Recommended Other Services None   Consulted and Agree with Plan of Care Patient      Patient will benefit from  skilled therapeutic intervention in order to improve the following deficits and impairments:  Decreased activity tolerance, Decreased balance, Improper body mechanics, Pain, Decreased endurance, Decreased strength, Difficulty walking, Decreased mobility  Visit Diagnosis: Muscle weakness (generalized)  Acute midline low back pain without sciatica  Other abnormalities of gait and mobility  Gait instability     Problem List Patient Active Problem List   Diagnosis Date Noted  . GERD (gastroesophageal reflux disease) 02/25/2016  . CVA (cerebral vascular accident) (HCC)-No residual deficit 02/12/2016  . Glaucoma 02/12/2016  . Depression, major, recurrent ([place]) 02/12/2016  . Dyslipidemia 02/12/2016  . OSA (obstructive sleep apnea) 02/12/2016  . Vitamin D deficiency 01/14/2016  . DM (diabetes mellitus), type 2 with neurological complications ([place]) 99991111  . Essential hypertension 01/14/2016  . Peripheral neuropathy ([place]) 01/14/2016    Earlie Counts, PT 03/08/16 3:30 PM   [facility] [address], [place], [place], [address] Phone: [phone]   Fax:  [phone]  Name: EMANUEL ZOCH MRN: [id number] Date of Birth: [birth year]

## 2016-03-10 ENCOUNTER — Ambulatory Visit: Payer: Medicare Other | Admitting: Physical Therapy

## 2016-03-10 DIAGNOSIS — M6281 Muscle weakness (generalized): Secondary | ICD-10-CM | POA: Diagnosis not present

## 2016-03-10 DIAGNOSIS — R2689 Other abnormalities of gait and mobility: Secondary | ICD-10-CM

## 2016-03-10 DIAGNOSIS — M545 Low back pain, unspecified: Secondary | ICD-10-CM

## 2016-03-10 DIAGNOSIS — R2681 Unsteadiness on feet: Secondary | ICD-10-CM

## 2016-03-10 NOTE — Therapy (Signed)
[facility] [address] [place], [place], [address] Phone: [phone]   Fax:  [phone]  Physical Therapy Treatment/Discharge Summary  Patient Details  Name: Alexa Brown MRN: [id number] Date of Birth: 1941/02/13 Referring Provider: Denean Martinique MD  Encounter Date: 03/10/2016      PT End of Session - 03/10/16 1525    Visit Number 12   Number of Visits 20   Date for PT Re-Evaluation 03/26/16   Authorization Type Kx modifier at visit 15   PT Start Time 1432   PT Stop Time 1535   PT Time Calculation (min) 63 min   Activity Tolerance Patient tolerated treatment well      Past Medical History:  Diagnosis Date  . Chicken pox   . Depression   . Diabetes mellitus without complication ([place])   . GERD (gastroesophageal reflux disease)   . Hyperlipidemia   . Hypertension   . Stroke ([place])   . UTI (urinary tract infection)     Past Surgical History:  Procedure Laterality Date  . ABDOMINAL HYSTERECTOMY    . APPENDECTOMY    . BREAST SURGERY     biopsy    There were no vitals filed for this visit.      Subjective Assessment - 03/10/16 1432    Subjective States her back has eased up some.  Reports she finished putting up her tree today.  Denies any balance disturbance.  Patient states she would like to wind up with therapy today since it is a busy time of the year.    Reports she has more energy since starting therapy and her balance is a lot better.     Currently in Pain? Yes   Pain Score 2    Pain Location Back   Pain Type Chronic pain            [facility] PT Assessment - 03/10/16 0001      Strength   Overall Strength --  Quads 4+/5   Overall Strength Comments bilateral UEs 5-/5  Core strength 4/5   Right Hip Flexion 4+/5   Right Hip Extension 4+/5   Right Hip ABduction 4/5   Right Hip ADduction 4/5   Left Hip Flexion 4+/5   Left Hip Extension 4+/5     Berg Balance Test   Sit to  Stand Able to stand without using hands and stabilize independently   Standing Unsupported Able to stand safely 2 minutes   Sitting with Back Unsupported but Feet Supported on Floor or Stool Able to sit safely and securely 2 minutes   Stand to Sit Sits safely with minimal use of hands   Transfers Able to transfer safely, minor use of hands   Standing Unsupported with Eyes Closed Able to stand 10 seconds safely   Standing Ubsupported with Feet Together Able to place feet together independently and stand 1 minute safely   From Standing, Reach Forward with Outstretched Arm Can reach confidently >25 cm (10")   From Standing Position, Pick up Object from Floor Able to pick up shoe safely and easily   From Standing Position, Turn to Look Behind Over each Shoulder Looks behind from both sides and weight shifts well   Turn 360 Degrees Able to turn 360 degrees safely in 4 seconds or less   Standing Unsupported, Alternately Place Feet on Step/Stool Able to stand independently and safely and complete 8 steps in 20 seconds   Standing Unsupported, One Foot in [employer]  to plae foot ahead of the other independently and hold 30 seconds   Standing on One Leg Able to lift leg independently and hold 5-10 seconds   Total Score 54                     [facility] Adult PT Treatment/Exercise - 03/10/16 0001      Knee/Hip Exercises: Aerobic   Nustep Nustep level 3 x 6 min seat #9, arms #12  Therapist present to discuss treatment     Knee/Hip Exercises: Standing   Walking with Sports Cord 25# backward and forward 6x each  close contact guard   Gait Training 20# resisted sidestepping   5xeach; close contact guard   Other Standing Knee Exercises rebounder weight shifting 4 ways, side to side, marching 1 min each     Knee/Hip Exercises: Seated   Sit to Sand 10 reps     Moist Heat Therapy   Number Minutes Moist Heat 15 Minutes   Moist Heat Location Lumbar Spine     Electrical Stimulation    Electrical Stimulation Location bilateral paraspinals   Electrical Stimulation Action IFC   Electrical Stimulation Parameters 15 ma 15 min   Electrical Stimulation Goals Pain                  PT Short Term Goals - 03/10/16 1454      PT SHORT TERM GOAL #1   Title Pt will be independent with her HEP   Status Achieved     PT SHORT TERM GOAL #2   Title Pt will improve her exercise tolerance to 30 minutes with no rest break.    Status Achieved     PT SHORT TERM GOAL #3   Title Pt will be able to demonstrate correct step through gait pattern with no scissoring or decresed foot clearence on the left LE to improve safety.    Status Achieved           PT Long Term Goals - 03/10/16 1453      PT LONG TERM GOAL #1   Title pt will improve her BERG score from 46 to 52 to improve functional mobility and dynamic balance safety.   Status Achieved     PT LONG TERM GOAL #2   Title Pt will have reported no falls in the last 4 weeks.    Status Achieved     PT LONG TERM GOAL #3   Title Pt will improve bilateral LE strength to grossly 4/5 bilaterally in order to improve gait.    Status Achieved               Plan - 03/10/16 1525    Clinical Impression Statement The patient has made excellent improvements in balance and strength in LEs and core.  Her BERG balance has improved signficantly and her score is now 54/56 indicating very low risk of falls.  She states her back pain is still present but much less in intensity.  She plans to initiate [employer] fitness through [employer] in January and is considering purchasing a TENS unit for home for pain control.  She is independent in a HEP for core and LE strengthening as well as balance ex.  She has met all short term and long term goals.  Will discharge from PT.        [place]  Visits from Start of Care: 12  Current functional level related to goals / functional outcomes: See  clinical impressions  above   Remaining deficits: As Above   Education / Equipment: Comprehensive HEP Plan: Patient agrees to discharge.  Patient goals were met. Patient is being discharged due to meeting the stated rehab goals.  ?????          Patient will benefit from skilled therapeutic intervention in order to improve the following deficits and impairments:     Visit Diagnosis: Muscle weakness (generalized)  Acute midline low back pain without sciatica  Other abnormalities of gait and mobility  Gait instability       G-Codes - 03/21/2016 1721    Functional Assessment Tool Used BERG, clinical assessment   Functional Limitation Mobility: Walking and moving around   Mobility: Walking and Moving Around Goal Status [phone]) At least 20 percent but less than 40 percent impaired, limited or restricted   Mobility: Walking and Moving Around Discharge Status [id number]) At least 20 percent but less than 40 percent impaired, limited or restricted      Problem List Patient Active Problem List   Diagnosis Date Noted  . GERD (gastroesophageal reflux disease) 02/25/2016  . CVA (cerebral vascular accident) (HCC)-No residual deficit 02/12/2016  . Glaucoma 02/12/2016  . Depression, major, recurrent ([place]) 02/12/2016  . Dyslipidemia 02/12/2016  . OSA (obstructive sleep apnea) 02/12/2016  . Vitamin D deficiency 01/14/2016  . DM (diabetes mellitus), type 2 with neurological complications ([place]) 28/00/3491  . Essential hypertension 01/14/2016  . Peripheral neuropathy ([place]) 01/14/2016   Ruben Im, PT 03-21-16 5:24 PM Phone: [phone] Fax: [phone] Alvera Singh 03-21-2016, 5:23 PM  [facility] [address] [place], [place], [address] Phone: [phone]   Fax:  [phone]  Name: AERIONNA MORAVEK MRN: [id number] Date of Birth: March 14, 1941

## 2016-03-11 ENCOUNTER — Other Ambulatory Visit: Payer: Self-pay

## 2016-03-11 MED ORDER — GLUCOSE BLOOD VI STRP: ORAL_STRIP | 2 refills | Status: DC

## 2016-03-11 MED ORDER — ACCU-CHEK FASTCLIX LANCETS MISC: 2 refills | Status: DC

## 2016-03-31 ENCOUNTER — Other Ambulatory Visit: Payer: Self-pay

## 2016-03-31 MED ORDER — ACCU-CHEK SOFTCLIX LANCET DEV MISC: 2 refills | Status: AC

## 2016-04-25 ENCOUNTER — Other Ambulatory Visit: Payer: Self-pay

## 2016-04-25 MED ORDER — BENAZEPRIL-HYDROCHLOROTHIAZIDE 20-12.5 MG PO TABS: ORAL_TABLET | ORAL | 1 refills | Status: DC

## 2016-05-02 DIAGNOSIS — L905 Scar conditions and fibrosis of skin: Secondary | ICD-10-CM | POA: Diagnosis not present

## 2016-05-02 DIAGNOSIS — H2513 Age-related nuclear cataract, bilateral: Secondary | ICD-10-CM | POA: Diagnosis not present

## 2016-05-02 DIAGNOSIS — E119 Type 2 diabetes mellitus without complications: Secondary | ICD-10-CM | POA: Diagnosis not present

## 2016-05-02 DIAGNOSIS — H04123 Dry eye syndrome of bilateral lacrimal glands: Secondary | ICD-10-CM | POA: Diagnosis not present

## 2016-05-02 DIAGNOSIS — H40033 Anatomical narrow angle, bilateral: Secondary | ICD-10-CM | POA: Diagnosis not present

## 2016-05-03 ENCOUNTER — Other Ambulatory Visit: Payer: Self-pay | Admitting: Family Medicine

## 2016-05-03 MED ORDER — BENAZEPRIL HCL 20 MG PO TABS: 20.0000 mg | ORAL_TABLET | Freq: Every day | ORAL | 1 refills | Status: DC

## 2016-05-25 NOTE — Progress Notes (Signed)
HPI:   Alexa Brown is a 76 y.o. female, who is here today with her husband to follow on some chronic medical problems.  Depression: She is on Wellbutrin XL 300 mg and Cymbalta 60 mg daily. Her husband was recently hospitalized for upper GI bleed and abdominal CT showed new metastasis for his pancreatic cancer. She denies suicidal thoughts. She feels like she is dealing well with stress.   Diabetes Mellitus II:   Currently on non pharmacologic treatment, Invokana was discontinued 3-4 months ago. Problem has been well controlled. Checking BS's : She does not recall specific numbers,husband remember some:120's-130's one time 160. Hypoglycemia:Denies  She denies abdominal pain, nausea, vomiting, polydipsia, polyuria, or polyphagia. + Peripheral neuropathy (burning sensation on feet), she is on Cymbalta 60 mg daily    Lab Results  Component Value Date   CREATININE 1.06 01/14/2016   BUN 23 01/14/2016   NA 142 01/14/2016   K 4.0 01/14/2016   CL 106 01/14/2016   CO2 26 01/14/2016    Lab Results  Component Value Date   HGBA1C 6.1 01/14/2016   Lab Results  Component Value Date   MICROALBUR <0.7 01/14/2016    HTN: She is currently on Benazepril 20 mg daily. Hx of CVA, no residual deficit. She is on Plavix 75 mg daily. Denies severe/frequent headache, visual changes, chest pain, dyspnea, palpitation, claudication, focal weakness, or edema.   HLD: She is on Atorvastatin 40 mg daily. Tolerating medications well.  She is also following low fat diet.   Lab Results  Component Value Date   CHOL 195 10/29/2014   HDL 64 10/29/2014   LDLCALC 93 10/29/2014   TRIG 188 (A) 10/29/2014     GERD: Last OV Protonix was resumed, heartburn resolved.  Denies changes in bowel habits, blood in stool or melena.   She had a "little" constipation a few days ago but resolved.   Hx of generalized OA: Mainly knees pain and back   + Stiffness. No falls sine  her last OV. She was released from ortho and competed PT. Pain is stable.    Review of Systems  Constitutional: Positive for fatigue (no more than usual). Negative for appetite change and fever.  HENT: Negative for mouth sores, nosebleeds, sore throat and trouble swallowing.   Eyes: Negative for pain, redness and visual disturbance.  Respiratory: Negative for cough, shortness of breath and wheezing.   Cardiovascular: Negative for chest pain, palpitations and leg swelling.  Gastrointestinal: Negative for abdominal pain, nausea and vomiting.       Negative for changes in bowel habits.  Endocrine: Negative for polydipsia, polyphagia and polyuria.  Genitourinary: Negative for decreased urine volume and hematuria.  Musculoskeletal: Positive for arthralgias and gait problem.  Skin: Negative for rash and wound.  Neurological: Positive for numbness (feet). Negative for syncope, weakness and headaches.  Psychiatric/Behavioral: Negative for confusion, sleep disturbance and suicidal ideas. The patient is not nervous/anxious.       Current Outpatient Prescriptions on File Prior to Visit  Medication Sig Dispense Refill  . allopurinol (ZYLOPRIM) 100 MG tablet Take 100 mg by mouth 3 (three) times daily.    Marland Kitchen aspirin EC 81 MG tablet Take 81 mg by mouth daily.    Marland Kitchen atorvastatin (LIPITOR) 40 MG tablet Take 40 mg by mouth daily.    . benazepril (LOTENSIN) 20 MG tablet Take 1 tablet (20 mg total) by mouth daily. 90 tablet 1  . buPROPion (WELLBUTRIN XL) 300 MG 24  hr tablet Take 1 tablet (300 mg total) by mouth daily. 90 tablet 2  . Cholecalciferol (VITAMIN D3) 5000 units CAPS Take 1 capsule by mouth daily.    . clopidogrel (PLAVIX) 75 MG tablet Take 1 tablet (75 mg total) by mouth daily. 90 tablet 0  . DULoxetine (CYMBALTA) 60 MG capsule Take 1 capsule (60 mg total) by mouth daily. 90 capsule 1  . glucose blood (ACCU-CHEK AVIVA) test strip Use to test blood sugar once daily. 100 each 2  . L-Lysine 500  MG CAPS Take 1 capsule by mouth daily.    Elmore Gu[employer] Devices (ACCU-CHEK SOFTCLIX) lancets Use to test blood sugar once a day. 100 each 2  . latanoprost (XALATAN) 0.005 % ophthalmic solution Place 1 drop into both eyes at bedtime.    . pantoprazole (PROTONIX) 40 MG tablet Take 1 tablet (40 mg total) by mouth daily. 90 tablet 1  . triamcinolone cream (KENALOG) 0.5 % Apply 1 application topically 2 (two) times daily.     No current facility-administered medications on file prior to visit.      Past Medical History:  Diagnosis Date  . Chicken pox   . Depression   . Diabetes mellitus without complication ([place])   . GERD (gastroesophageal reflux disease)   . Hyperlipidemia   . Hypertension   . Stroke ([place])   . UTI (urinary tract infection)    No Known Allergies  Social History   Social History  . Marital status: Married    Spouse name: N/A  . Number of children: N/A  . Years of education: N/A   Social History Main Topics  . Smoking status: Never Smoker  . Smokeless tobacco: Never Used  . Alcohol use No  . Drug use: No  . Sexual activity: Not Currently   Other Topics Concern  . None   Social History Narrative  . None    Vitals:   05/26/16 0948  BP: 132/80  Pulse: 96  Resp: 12   Body mass index is 26.47 kg/m.   Physical Exam  Nursing note and vitals reviewed. Constitutional: She is oriented to person, place, and time. She appears well-developed and well-nourished. No distress.  HENT:  Head: Atraumatic.  Mouth/Throat: Oropharynx is clear and moist and mucous membranes are normal.  Eyes: Conjunctivae and EOM are normal.  Cardiovascular: Normal rate and regular rhythm.   No murmur heard. DP pulses present bilateral.   Respiratory: Effort normal and breath sounds normal. No respiratory distress.  GI: Soft. She exhibits no mass. There is no hepatomegaly. There is no tenderness.  Musculoskeletal: She exhibits no edema.  Knee crepitus bilateral, no limitation of  movement, no pain elicited.  Lymphadenopathy:    She has no cervical adenopathy.  Neurological: She is alert and oriented to person, place, and time. She has normal strength. Coordination normal.  Stable gait assisted with cane. SLR negative bilateral.  Skin: Skin is warm. No erythema.  Psychiatric: She has a normal mood and affect. Cognition and memory are normal.  Well groomed, good eye contact.    Diabetic foot exam:  Monofilament abnormal bilateral. Peripheral pulses present (DP). + calluses   ASSESSMENT AND PLAN:    Alexa Brown was seen today for follow-up.  Diagnoses and all orders for this visit:   Lab Results  Component Value Date   HGBA1C 6.1 05/26/2016   Lab Results  Component Value Date   CREATININE 1.00 05/26/2016   BUN 15 05/26/2016   NA 140 05/26/2016  K 4.2 05/26/2016   CL 107 05/26/2016   CO2 27 05/26/2016   Lab Results  Component Value Date   CHOL 176 05/26/2016   HDL 71.50 05/26/2016   LDLCALC 84 05/26/2016   TRIG 98.0 05/26/2016   CHOLHDL 2 05/26/2016    DM (diabetes mellitus), type 2 with neurological complications ([place])  123456 has been at goal. Continue non pharmacologic management, Further recommendations will be given according to HgA1C results. Regular exercise as tolerated and healthy diet with avoidance of added sugar food intake is an important part of treatment and recommended. Annual eye exam, periodic dental and foot care recommended. F/U in 6 months  -     Hemoglobin A1c -     Lipid panel -     Basic metabolic panel  Peripheral polyneuropathy (HCC)  Stable. No changes in current management. Foot care discussed.  Gastroesophageal reflux disease, esophagitis presence not specified  Improved. GERD precautions to continue. No changes in current management. F/U in 2 months.  Systolic hypertension with cerebrovascular disease  Adequately controlled. No changes in current management. DASH diet recommended. Eye exam  recommended annually. F/U in 6 months, before if needed.  Recurrent major depressive disorder, in partial remission (HCC)  Stable. No changes in current management. F/U in 5-6 months.  Dyslipidemia  No changes in current management, will follow labs done today and will give further recommendations accordingly. F/U in 6-12 months.  -     Lipid panel    -Ms. Alexa Brown was advised to return sooner than planned today if new concerns arise.       Honor G. Martinique, MD  [facility]. [place] office.

## 2016-05-26 ENCOUNTER — Ambulatory Visit (INDEPENDENT_AMBULATORY_CARE_PROVIDER_SITE_OTHER): Payer: Medicare Other | Admitting: Family Medicine

## 2016-05-26 ENCOUNTER — Encounter: Payer: Self-pay | Admitting: Family Medicine

## 2016-05-26 VITALS — BP 132/80 | HR 96 | Resp 12 | Ht 65.5 in | Wt 161.5 lb

## 2016-05-26 DIAGNOSIS — I674 Hypertensive encephalopathy: Secondary | ICD-10-CM

## 2016-05-26 DIAGNOSIS — G629 Polyneuropathy, unspecified: Secondary | ICD-10-CM

## 2016-05-26 DIAGNOSIS — E1149 Type 2 diabetes mellitus with other diabetic neurological complication: Secondary | ICD-10-CM

## 2016-05-26 DIAGNOSIS — E785 Hyperlipidemia, unspecified: Secondary | ICD-10-CM | POA: Diagnosis not present

## 2016-05-26 DIAGNOSIS — K219 Gastro-esophageal reflux disease without esophagitis: Secondary | ICD-10-CM

## 2016-05-26 DIAGNOSIS — F3341 Major depressive disorder, recurrent, in partial remission: Secondary | ICD-10-CM | POA: Diagnosis not present

## 2016-05-26 LAB — BASIC METABOLIC PANEL
BUN: 15 mg/dL (ref 6–23)
CHLORIDE: 107 meq/L (ref 96–112)
CO2: 27 meq/L (ref 19–32)
CREATININE: 1 mg/dL (ref 0.40–1.20)
Calcium: 9.9 mg/dL (ref 8.4–10.5)
GFR: 57.62 mL/min — ABNORMAL LOW (ref >60.00)
GLUCOSE: 131 mg/dL — AB (ref 70–99)
Potassium: 4.2 mEq/L (ref 3.5–5.1)
Sodium: 140 mEq/L (ref 135–145)

## 2016-05-26 LAB — LIPID PANEL
CHOLESTEROL: 176 mg/dL (ref 0–200)
HDL: 71.5 mg/dL (ref >39.00)
LDL Cholesterol: 84 mg/dL (ref 0–99)
NONHDL: 104.07
Total CHOL/HDL Ratio: 2
Triglycerides: 98 mg/dL (ref 0.0–149.0)
VLDL: 19.6 mg/dL (ref 0.0–40.0)

## 2016-05-26 LAB — HEMOGLOBIN A1C: HEMOGLOBIN A1C: 6.1 % (ref 4.6–6.5)

## 2016-05-26 NOTE — Progress Notes (Signed)
Pre visit review using our clinic review tool, if applicable. No additional management support is needed unless otherwise documented below in the visit note. 

## 2016-05-26 NOTE — Patient Instructions (Addendum)
A few things to remember from today's visit:   Gastroesophageal reflux disease, esophagitis presence not specified  DM (diabetes mellitus), type 2 with neurological complications ([place]) - Plan: Hemoglobin A1c, Lipid panel, Basic metabolic panel  Peripheral polyneuropathy (HCC)  Recurrent major depressive disorder, in partial remission (HCC)  Dyslipidemia - Plan: Lipid panel  No changes today.   Please be sure medication list is accurate. If a new problem present, please set up appointment sooner than planned today.

## 2016-05-28 ENCOUNTER — Encounter: Payer: Self-pay | Admitting: Family Medicine

## 2016-05-28 DIAGNOSIS — N183 Chronic kidney disease, stage 3 unspecified: Secondary | ICD-10-CM | POA: Insufficient documentation

## 2016-06-07 ENCOUNTER — Ambulatory Visit (INDEPENDENT_AMBULATORY_CARE_PROVIDER_SITE_OTHER): Payer: Medicare Other | Admitting: Pulmonary Disease

## 2016-06-07 ENCOUNTER — Encounter: Payer: Self-pay | Admitting: Pulmonary Disease

## 2016-06-07 VITALS — BP 98/60 | HR 90 | Ht 67.0 in | Wt 162.8 lb

## 2016-06-07 DIAGNOSIS — G4733 Obstructive sleep apnea (adult) (pediatric): Secondary | ICD-10-CM

## 2016-06-07 NOTE — Progress Notes (Signed)
Past Surgical History She  has a past surgical history that includes Abdominal hysterectomy; Appendectomy; and Breast surgery.  No Known Allergies  Family History Her family history includes Asthma in her father; Heart disease in her father and mother; Hyperlipidemia in her father and mother; Hypertension in her father and mother; Stroke in her father and mother.  Social History She  reports that she has never smoked. She has never used smokeless tobacco. She reports that she drinks alcohol. She reports that she does not use drugs.  Review of systems  Constitutional: Negative for fever and unexpected weight change.  HENT: Positive for congestion. Negative for dental problem, ear pain, nosebleeds, postnasal drip, rhinorrhea, sinus pressure, sneezing, sore throat and trouble swallowing.   Eyes: Negative for redness and itching.  Respiratory: Negative for cough, chest tightness, shortness of breath and wheezing.   Cardiovascular: Negative for palpitations and leg swelling.  Gastrointestinal: Negative for nausea and vomiting.       Acid heartburn  Genitourinary: Negative for dysuria.  Musculoskeletal: Positive for arthralgias. Negative for joint swelling.  Skin: Negative for rash.  Neurological: Negative for headaches.  Hematological: Does not bruise/bleed easily.  Psychiatric/Behavioral: Positive for dysphoric mood. The patient is not nervous/anxious.     Current Outpatient Prescriptions on File Prior to Visit  Medication Sig  . allopurinol (ZYLOPRIM) 100 MG tablet Take 100 mg by mouth 3 (three) times daily.  Marland Kitchen aspirin EC 81 MG tablet Take 81 mg by mouth daily.  Marland Kitchen atorvastatin (LIPITOR) 40 MG tablet Take 40 mg by mouth daily.  . benazepril (LOTENSIN) 20 MG tablet Take 1 tablet (20 mg total) by mouth daily.  Marland Kitchen buPROPion (WELLBUTRIN XL) 300 MG 24 hr tablet Take 1 tablet (300 mg total) by mouth daily.  . Cholecalciferol (VITAMIN D3) 5000 units CAPS Take 1 capsule by mouth daily.  .  clopidogrel (PLAVIX) 75 MG tablet Take 1 tablet (75 mg total) by mouth daily.  . DULoxetine (CYMBALTA) 60 MG capsule Take 1 capsule (60 mg total) by mouth daily.  Marland Kitchen glucose blood (ACCU-CHEK AVIVA) test strip Use to test blood sugar once daily.  Marland Kitchen L-Lysine 500 MG CAPS Take 1 capsule by mouth daily.  [employer] Devices (ACCU-CHEK SOFTCLIX) lancets Use to test blood sugar once a day.  . latanoprost (XALATAN) 0.005 % ophthalmic solution Place 1 drop into both eyes at bedtime.  . pantoprazole (PROTONIX) 40 MG tablet Take 1 tablet (40 mg total) by mouth daily.  Marland Kitchen triamcinolone cream (KENALOG) 0.5 % Apply 1 application topically 2 (two) times daily.   No current facility-administered medications on file prior to visit.     Chief Complaint  Patient presents with  . SLEEP CONSULT    Referred by Dr Amorette Martinique. Sleep study in [place], [place] x 4 years ago. Current CPAP through [facility]--has nto used since August 2017.  Epworth Score: 11    Past medical history She  has a past medical history of Chicken pox; Depression; Diabetes mellitus without complication ([place]); GERD (gastroesophageal reflux disease); Hyperlipidemia; Hypertension; Stroke [facility]); and UTI (urinary tract infection).  Vital signs BP 98/60 (BP Location: Left Arm, Cuff Size: Normal)   Pulse 90   Ht 5\' 7"  (1.702 m)   Wt 162 lb 12.8 oz (73.8 kg)   SpO2 93%   BMI 25.50 kg/m   History of Present Illness Alexa Brown is a 76 y.o. female for evaluation of sleep problems.  She had a sleep study while living in [place]. [place]  about 4 years ago and was dx with sleep apnea.  She was set up with auto CPAP, and was using until about 1 year ago.  Her husband was dx with pancreatic cancer and they had to move back to [place].  During that process she stopped using CPAP.  She also lost about 20 lbs.  Her sleep has improved, but she still has some trouble.  She snores, and her husband says she stops breathing while asleep.  She has trouble sleeping on  her back, and gets frequent dreams.  She will talk in her sleep.  She goes to sleep at 10 pm.  She falls asleep after 30 minutes.  She sleeps through the night.  She gets out of bed at 8 am.  She feels tired in the morning.  She denies morning headache.  She does not use anything to help her fall sleep.  She drinks 1 or 2 cups of coffee in the morning.  She denies sleep walking, bruxism, or nightmares.  There is no history of restless legs.  She denies sleep hallucinations, sleep paralysis, or cataplexy.  The Epworth score is 11 out of 24.   Physical Exam:  General - No distress ENT - No sinus tenderness, no oral exudate, no LAN, no thyromegaly, TM clear, pupils equal/reactive, MP 3 Cardiac - s1s2 regular, no murmur, pulses symmetric Chest - No wheeze/rales/dullness, good air entry, normal respiratory excursion Back - No focal tenderness Abd - Soft, non-tender, no organomegaly, + bowel sounds Ext - No edema Neuro - Normal strength, cranial nerves intact Skin - No rashes Psych - Normal mood, and behavior  Discussion: She has snoring, sleep disruption, apnea, and daytime sleepiness.  She has prior history of sleep apnea.  She has lost weight since original diagnosis, but still has symptoms.  She also has hx of hypertension, CVA, and depression.  I am concerned she could still have sleep apnea.  We discussed how sleep apnea can affect various health problems, including risks for hypertension, cardiovascular disease, and diabetes.  We also discussed how sleep disruption can increase risks for accidents, such as while driving.  Weight loss as a means of improving sleep apnea was also reviewed.  Additional treatment options discussed were CPAP therapy, oral appliance, and surgical intervention.   Assessment/plan:  Obstructive sleep apnea. - will arrange for home sleep study to assess current status   Patient Instructions  Will arrange for home sleep study Will call to arrange for follow  up after sleep study reviewed     Chesley Mires, M.D. Pager [phone] 06/07/2016, 11:47 AM

## 2016-06-07 NOTE — Progress Notes (Signed)
   Subjective:    Patient ID: Alexa Brown, female    DOB: 1941/02/25, 76 y.o.   MRN: [id number]  HPI    Review of Systems  Constitutional: Negative for fever and unexpected weight change.  HENT: Positive for congestion. Negative for dental problem, ear pain, nosebleeds, postnasal drip, rhinorrhea, sinus pressure, sneezing, sore throat and trouble swallowing.   Eyes: Negative for redness and itching.  Respiratory: Negative for cough, chest tightness, shortness of breath and wheezing.   Cardiovascular: Negative for palpitations and leg swelling.  Gastrointestinal: Negative for nausea and vomiting.       Acid heartburn  Genitourinary: Negative for dysuria.  Musculoskeletal: Positive for arthralgias. Negative for joint swelling.  Skin: Negative for rash.  Neurological: Negative for headaches.  Hematological: Does not bruise/bleed easily.  Psychiatric/Behavioral: Positive for dysphoric mood. The patient is not nervous/anxious.        Objective:   Physical Exam        Assessment & Plan:

## 2016-06-07 NOTE — Patient Instructions (Signed)
Will arrange for home sleep study Will call to arrange for follow up after sleep study reviewed  

## 2016-06-14 ENCOUNTER — Telehealth: Payer: Self-pay

## 2016-06-14 MED ORDER — ATORVASTATIN CALCIUM 40 MG PO TABS: 40.0000 mg | ORAL_TABLET | Freq: Every day | ORAL | 1 refills | Status: DC

## 2016-06-14 NOTE — Telephone Encounter (Signed)
Refill request for Allopurinol 100 mg 1 tablet three times daily.  Okay to refill?

## 2016-06-17 NOTE — Telephone Encounter (Signed)
Message sent to patient

## 2016-06-17 NOTE — Telephone Encounter (Signed)
I have not prescribed this medication for her before.  Please call and ask pt who is prescribing this medication,how she is taking it, and if she is still following with provider who first prescribed it. Thanks, BJ

## 2016-06-21 DIAGNOSIS — G4733 Obstructive sleep apnea (adult) (pediatric): Secondary | ICD-10-CM | POA: Diagnosis not present

## 2016-06-23 ENCOUNTER — Other Ambulatory Visit: Payer: Self-pay | Admitting: Family Medicine

## 2016-06-23 MED ORDER — ALLOPURINOL 100 MG PO TABS: 100.0000 mg | ORAL_TABLET | Freq: Three times a day (TID) | ORAL | 1 refills | Status: DC

## 2016-06-23 NOTE — Telephone Encounter (Signed)
Rx for Allopurinol sent to her pharmacy. Thanks, BJ

## 2016-06-23 NOTE — Telephone Encounter (Signed)
Medication was prescribed by patient's previous PCP: Dr. Freda Munro. She takes 1 tablet three times per day.

## 2016-06-30 ENCOUNTER — Other Ambulatory Visit: Payer: Self-pay | Admitting: Family Medicine

## 2016-07-03 ENCOUNTER — Telehealth: Payer: Self-pay | Admitting: Pulmonary Disease

## 2016-07-03 NOTE — Telephone Encounter (Signed)
HST 06/21/16 >> AHI 6.3, SaO2 low 82%   Will have my nurse inform pt that sleep study shows mild sleep apnea.  Please arrange for ROV with me to discuss treatment options in more detail.

## 2016-07-04 ENCOUNTER — Other Ambulatory Visit: Payer: Self-pay | Admitting: Family Medicine

## 2016-07-04 DIAGNOSIS — K219 Gastro-esophageal reflux disease without esophagitis: Secondary | ICD-10-CM

## 2016-07-04 DIAGNOSIS — G4733 Obstructive sleep apnea (adult) (pediatric): Secondary | ICD-10-CM | POA: Diagnosis not present

## 2016-07-05 ENCOUNTER — Other Ambulatory Visit: Payer: Self-pay | Admitting: *Deleted

## 2016-07-05 DIAGNOSIS — G4733 Obstructive sleep apnea (adult) (pediatric): Secondary | ICD-10-CM

## 2016-07-08 NOTE — Telephone Encounter (Signed)
Spoke with pt and notified of results per Dr. Halford Chessman. Pt verbalized understanding and denied any questions. Appt scheduled for 08/25/16

## 2016-08-19 DIAGNOSIS — G4700 Insomnia, unspecified: Secondary | ICD-10-CM | POA: Insufficient documentation

## 2016-08-25 ENCOUNTER — Encounter: Payer: Self-pay | Admitting: Pulmonary Disease

## 2016-08-25 ENCOUNTER — Ambulatory Visit (INDEPENDENT_AMBULATORY_CARE_PROVIDER_SITE_OTHER): Payer: Medicare Other | Admitting: Pulmonary Disease

## 2016-08-25 VITALS — BP 100/70 | HR 79 | Ht 67.0 in | Wt 165.0 lb

## 2016-08-25 DIAGNOSIS — G4733 Obstructive sleep apnea (adult) (pediatric): Secondary | ICD-10-CM

## 2016-08-25 NOTE — Patient Instructions (Signed)
Call to schedule follow up if you decide to use CPAP

## 2016-08-25 NOTE — Progress Notes (Signed)
Current Outpatient Prescriptions on File Prior to Visit  Medication Sig  . allopurinol (ZYLOPRIM) 100 MG tablet Take 1 tablet (100 mg total) by mouth 3 (three) times daily.  Marland Kitchen aspirin EC 81 MG tablet Take 81 mg by mouth daily.  Marland Kitchen atorvastatin (LIPITOR) 40 MG tablet Take 1 tablet (40 mg total) by mouth daily.  . benazepril (LOTENSIN) 20 MG tablet Take 1 tablet (20 mg total) by mouth daily.  Marland Kitchen buPROPion (WELLBUTRIN XL) 300 MG 24 hr tablet Take 1 tablet (300 mg total) by mouth daily.  . Cholecalciferol (VITAMIN D3) 5000 units CAPS Take 1 capsule by mouth daily.  . clopidogrel (PLAVIX) 75 MG tablet TAKE ONE TABLET BY MOUTH DAILY  . DULoxetine (CYMBALTA) 60 MG capsule Take 1 capsule (60 mg total) by mouth daily.  Marland Kitchen glucose blood (ACCU-CHEK AVIVA) test strip Use to test blood sugar once daily.  Marland Kitchen L-Lysine 500 MG CAPS Take 1 capsule by mouth daily.  Elmore Guise Devices (ACCU-CHEK SOFTCLIX) lancets Use to test blood sugar once a day.  . latanoprost (XALATAN) 0.005 % ophthalmic solution Place 1 drop into both eyes at bedtime.  . pantoprazole (PROTONIX) 40 MG tablet TAKE 1 TABLET (40 MG TOTAL) BY MOUTH DAILY  . triamcinolone cream (KENALOG) 0.5 % Apply 1 application topically 2 (two) times daily.   No current facility-administered medications on file prior to visit.      Chief Complaint  Patient presents with  . Follow-up    Review sleep study. Current CPAP S9 with [facility]. Pt has not been using her CPAP     Sleep tests HST 06/21/16 >> AHI 6.3, SpO2 low 82%  Past medical history Depression, DM, GERD, HLD, HTN, CVA  Past surgical history, Family history, Social history, Allergies all reviewed.  Vital Signs BP 100/70 (BP Location: Left Arm, Cuff Size: Normal)   Pulse 79   Ht 5\' 7"  (1.702 m)   Wt 165 lb (74.8 kg)   SpO2 95%   BMI 25.84 kg/m   History of Present Illness Alexa Brown is a 76 y.o. female with OSA.  She hasn't been using CPAP.  She has extensive dental work.  She  is not sure how much trouble she has with her sleep.  Physical Exam  General - pleasant Eyes - pupils reactive ENT - no sinus tenderness, no oral exudate, no LAN, MP 3 Cardiac - regular, no murmur Chest - no wheeze, rales Abd - soft, non tender Ext - no edema Skin - no rashes Neuro - normal strength Psych - normal mood   Assessment/Plan  Obstructive sleep apnea. - We discussed how sleep apnea can affect various health problems, including risks for hypertension, cardiovascular disease, and diabetes.  We also discussed how sleep disruption can increase risks for accidents, such as while driving.  Weight loss as a means of improving sleep apnea was also reviewed.  Additional treatment options discussed were CPAP therapy, oral appliance, and surgical intervention. - she will consider her options and call once she makes a decision about treatment for sleep apnea   Patient Instructions  Call to schedule follow up if you decide to use CPAP    Chesley Mires, MD [facility] Pager:  [phone] 08/25/2016, 11:05 AM

## 2016-08-27 ENCOUNTER — Other Ambulatory Visit: Payer: Self-pay | Admitting: Family Medicine

## 2016-09-19 NOTE — Progress Notes (Signed)
Pre visit review using our clinic review tool, if applicable. No additional management support is needed unless otherwise documented below in the visit note. 

## 2016-09-19 NOTE — Progress Notes (Signed)
Subjective:   Alexa Brown is a 76 y.o. female who presents for an Initial Medicare Annual Wellness Visit.  Pt states that she is concerned she is starting get an ulcer. States that she has had burning in her esophagus and pressure when she lays down. She states this has been going on for 6 months.   Pt also states that her depression is worse because her husband is in hospice at home due to pancreatic cancer. Pt does not believe her Wellbutrin is working. Denies any thoughts of hurting herself or committing suicide.  Pt also requests referral for dermatologist and podiatrist.   Review of Systems    No ROS.  Medicare Wellness Visit. Additional risk factors are reflected in the social history.  Cardiac Risk Factors include: advanced age (>88men, >20 women);diabetes mellitus;dyslipidemia;hypertension   Sleep patterns: 4-6 hrs/night. Wakes up feeling tired. Gets up frequently to check on husband.    Home Safety/Smoke Alarms: Feels safe in home. Smoke alarms in place. Carbon monoxide alarms also in place.  Living environment; residence and Firearm Safety: Lives with husband in one story home with no stairs.  Seat Belt Safety/Bike Helmet: Wears seat belt.   Counseling:   Eye Exam- Sees Dr Peter Congo [place] with [facility]. Last exam was 05/02/2016. Dental- Needs follow up.   Female:   Pap- Aged out.       Mammo-   01/29/2015  Benign. Dexa scan-     05/25/2007 per [facility].   [facility]- 05/07/2007 Polyps. Pt will call with physician who did Colonoscopy.      Objective:    Today's Vitals   09/22/16 1056  BP: 118/62  Pulse: 81  Resp: 16  SpO2: 93%  Weight: 165 lb 9.6 oz (75.1 kg)  Height: 5\' 7"  (1.702 m)   Body mass index is 25.94 kg/m.   Current Medications (verified) Outpatient Encounter Prescriptions as of 09/22/2016  Medication Sig  . allopurinol (ZYLOPRIM) 100 MG tablet Take 1 tablet (100 mg total) by mouth 3 (three) times daily.  Marland Kitchen aspirin EC 81 MG  tablet Take 81 mg by mouth daily.  Marland Kitchen atorvastatin (LIPITOR) 40 MG tablet Take 1 tablet (40 mg total) by mouth daily.  . benazepril (LOTENSIN) 20 MG tablet Take 1 tablet (20 mg total) by mouth daily.  Marland Kitchen buPROPion (WELLBUTRIN XL) 300 MG 24 hr tablet Take 1 tablet (300 mg total) by mouth daily.  . Cholecalciferol (VITAMIN D3) 5000 units CAPS Take 1 capsule by mouth daily.  . clopidogrel (PLAVIX) 75 MG tablet TAKE ONE TABLET BY MOUTH DAILY  . DULoxetine (CYMBALTA) 60 MG capsule TAKE ONE CAPSULE BY MOUTH DAILY  . glucose blood (ACCU-CHEK AVIVA) test strip Use to test blood sugar once daily.  Marland Kitchen L-Lysine 500 MG CAPS Take 1 capsule by mouth daily.  Elmore Guise Devices (ACCU-CHEK SOFTCLIX) lancets Use to test blood sugar once a day.  . latanoprost (XALATAN) 0.005 % ophthalmic solution Place 1 drop into both eyes at bedtime.  . pantoprazole (PROTONIX) 40 MG tablet TAKE 1 TABLET (40 MG TOTAL) BY MOUTH DAILY  . triamcinolone cream (KENALOG) 0.5 % Apply 1 application topically 2 (two) times daily.   No facility-administered encounter medications on file as of 09/22/2016.     Allergies (verified) Patient has no known allergies.   History: Past Medical History:  Diagnosis Date  . Chicken pox   . Depression   . Diabetes mellitus without complication ([place])   . GERD (gastroesophageal reflux disease)   .  Hyperlipidemia   . Hypertension   . Stroke ([place])   . UTI (urinary tract infection)    Past Surgical History:  Procedure Laterality Date  . ABDOMINAL HYSTERECTOMY    . APPENDECTOMY    . BREAST SURGERY     biopsy   Family History  Problem Relation Age of Onset  . Hyperlipidemia Mother   . Heart disease Mother   . Stroke Mother   . Hypertension Mother   . Hyperlipidemia Father   . Heart disease Father   . Stroke Father   . Hypertension Father   . Asthma Father   . Parkinson's disease Sister    Social History   Occupational History  . N/A    Social History Main Topics  . Smoking  status: Never Smoker  . Smokeless tobacco: Never Used  . Alcohol use Yes     Comment: occassional wine  . Drug use: No  . Sexual activity: Not Currently    Tobacco Counseling Counseling given: Not Answered   Activities of Daily Living In your present state of health, do you have any difficulty performing the following activities: 09/22/2016  Hearing? N  Vision? N  Difficulty concentrating or making decisions? Y  Walking or climbing stairs? Y  Dressing or bathing? Y  Doing errands, shopping? Y  Preparing Food and eating ? N  Using the Toilet? N  In the past six months, have you accidently leaked urine? N  Do you have problems with loss of bowel control? N  Managing your Medications? N  Managing your Finances? N  Housekeeping or managing your Housekeeping? Y  Some recent data might be hidden    Immunizations and Health Maintenance Immunization History  Administered Date(s) Administered  . Influenza Whole 02/01/2016  . Influenza, High Dose Seasonal PF 01/14/2016  . Tdap 12/24/2015  . Zoster Recombinat (Shingrix) 08/16/2016   Health Maintenance Due  Topic Date Due  . DEXA SCAN  06/19/2007  . PNA vac Low Risk Adult (2 of 2 - PPSV23) 03/29/2015    Patient Care Team: Martinique, Kashlyn G, MD as PCP - General (Family Medicine)  Indicate any recent Medical Services you may have received from other than [facility] providers in the past year (date may be approximate).     Assessment:   This is a routine wellness examination for Alexa Brown. Physical assessment deferred to PCP.  Hearing/Vision screen Hearing Screening Comments: Able to hear conversational tones w/o difficulty. No issues reported.   Vision Screening Comments: Last check was in February. Yearly checks. Dr Lonia Skinner does Diabetic eye exams.  Dietary issues and exercise activities discussed: Current Exercise Habits: The patient does not participate in regular exercise at present, Exercise limited by: None identified    Diet (meal preparation, eat out, water intake, caffeinated beverages, dairy products, fruits and vegetables): 2-3 meals/day. Snacks on cheese and crackers, cookies, fresh fruit. Drinks 4-5 glasses water/day. 1 cup coffee/day.  Lunch: PB&J. Fruit. Left overs.   Dinner:    [employer] Leftover food from daughter.   Goals    . Pt wants to complete appts for dermatologist and podiatrist.       Depression Screen PHQ 2/9 Scores 09/22/2016  PHQ - 2 Score 6  PHQ- 9 Score 17    Fall Risk Fall Risk  09/22/2016  Falls in the past year? Yes  Number falls in past yr: 2 or more  Injury with Fall? No  Risk for fall due to : History of fall(s);Impaired balance/gait  Follow up  Education provided;Falls prevention discussed    Cognitive Function:   Ad8 score reviewed for issues:  Issues making decisions:no  Less interest in hobbies / activities:no  Repeats questions, stories (family complaining):no  Trouble using ordinary gadgets (microwave, computer, phone):no  Forgets the month or year: no  Mismanaging finances: no  Remembering appts:no  Daily problems with thinking and/or memory:no Ad8 score is=0       Screening Tests Health Maintenance  Topic Date Due  . DEXA SCAN  06/19/2007  . PNA vac Low Risk Adult (2 of 2 - PPSV23) 03/29/2015  . INFLUENZA VACCINE  10/26/2016  . HEMOGLOBIN A1C  11/26/2016  . MAMMOGRAM  01/28/2017  . OPHTHALMOLOGY EXAM  05/02/2017  . COLONOSCOPY  05/06/2017  . FOOT EXAM  05/26/2017  . TETANUS/TDAP  12/23/2025      Plan:   Discussed pts concern about an ulcer with Dr Maudie Mercury. Pt will follow up with PCP in the next couple days.   Pt will schedule an office visit to discuss possible ulcer and depression.   Pt will schedule mammogram and bone density.  I have personally reviewed and noted the following in the patient's chart:   . Medical and social history . Use of alcohol, tobacco or illicit drugs  . Current medications and  supplements . Functional ability and status . Nutritional status . Physical activity . Advanced directives . List of other physicians . Vitals . Screenings to include cognitive, depression, and falls . Referrals and appointments  In addition, I have reviewed and discussed with patient certain preventive protocols, quality metrics, and best practice recommendations. A written personalized care plan for preventive services as well as general preventive health recommendations were provided to patient.     Ree Edman, RN   09/22/2016

## 2016-09-22 ENCOUNTER — Ambulatory Visit (INDEPENDENT_AMBULATORY_CARE_PROVIDER_SITE_OTHER): Payer: Medicare Other

## 2016-09-22 VITALS — BP 118/62 | HR 81 | Resp 16 | Ht 67.0 in | Wt 165.6 lb

## 2016-09-22 DIAGNOSIS — Z780 Asymptomatic menopausal state: Secondary | ICD-10-CM | POA: Diagnosis not present

## 2016-09-22 DIAGNOSIS — Z1231 Encounter for screening mammogram for malignant neoplasm of breast: Secondary | ICD-10-CM | POA: Diagnosis not present

## 2016-09-22 DIAGNOSIS — Z0000 Encounter for general adult medical examination without abnormal findings: Secondary | ICD-10-CM | POA: Diagnosis not present

## 2016-09-22 DIAGNOSIS — Z1239 Encounter for other screening for malignant neoplasm of breast: Secondary | ICD-10-CM

## 2016-09-22 NOTE — Patient Instructions (Signed)
Follow up with PCP in next couple days.  Call office with name of GI doctor who performed your last colonoscopy.  Bring a copy of your advance directives to your next office visit. Schedule mammogram and bone density exams.  Fall Prevention in the Home Falls can cause injuries. They can happen to people of all ages. There are many things you can do to make your home safe and to help prevent falls. What can I do on the outside of my home?  Regularly fix the edges of walkways and driveways and fix any cracks.  Remove anything that might make you trip as you walk through a door, such as a raised step or threshold.  Trim any bushes or trees on the path to your home.  Use bright outdoor lighting.  Clear any walking paths of anything that might make someone trip, such as rocks or tools.  Regularly check to see if handrails are loose or broken. Make sure that both sides of any steps have handrails.  Any raised decks and porches should have guardrails on the edges.  Have any leaves, snow, or ice cleared regularly.  Use sand or salt on walking paths during winter.  Clean up any spills in your garage right away. This includes oil or grease spills. What can I do in the bathroom?  Use night lights.  Install grab bars by the toilet and in the tub and shower. Do not use towel bars as grab bars.  Use non-skid mats or decals in the tub or shower.  If you need to sit down in the shower, use a plastic, non-slip stool.  Keep the floor dry. Clean up any water that spills on the floor as soon as it happens.  Remove soap buildup in the tub or shower regularly.  Attach bath mats securely with double-sided non-slip rug tape.  Do not have throw rugs and other things on the floor that can make you trip. What can I do in the bedroom?  Use night lights.  Make sure that you have a light by your bed that is easy to reach.  Do not use any sheets or blankets that are too big for your bed. They  should not hang down onto the floor.  Have a firm chair that has side arms. You can use this for support while you get dressed.  Do not have throw rugs and other things on the floor that can make you trip. What can I do in the kitchen?  Clean up any spills right away.  Avoid walking on wet floors.  Keep items that you use a lot in easy-to-reach places.  If you need to reach something above you, use a strong step stool that has a grab bar.  Keep electrical cords out of the way.  Do not use floor polish or wax that makes floors slippery. If you must use wax, use non-skid floor wax.  Do not have throw rugs and other things on the floor that can make you trip. What can I do with my stairs?  Do not leave any items on the stairs.  Make sure that there are handrails on both sides of the stairs and use them. Fix handrails that are broken or loose. Make sure that handrails are as long as the stairways.  Check any carpeting to make sure that it is firmly attached to the stairs. Fix any carpet that is loose or worn.  Avoid having throw rugs at the top or bottom  of the stairs. If you do have throw rugs, attach them to the floor with carpet tape.  Make sure that you have a light switch at the top of the stairs and the bottom of the stairs. If you do not have them, ask someone to add them for you. What else can I do to help prevent falls?  Wear shoes that: ? Do not have high heels. ? Have rubber bottoms. ? Are comfortable and fit you well. ? Are closed at the toe. Do not wear sandals.  If you use a stepladder: ? Make sure that it is fully opened. Do not climb a closed stepladder. ? Make sure that both sides of the stepladder are locked into place. ? Ask someone to hold it for you, if possible.  Clearly mark and make sure that you can see: ? Any grab bars or handrails. ? First and last steps. ? Where the edge of each step is.  Use tools that help you move around (mobility aids) if  they are needed. These include: ? Canes. ? Walkers. ? Scooters. ? Crutches.  Turn on the lights when you go into a dark area. Replace any light bulbs as soon as they burn out.  Set up your furniture so you have a clear path. Avoid moving your furniture around.  If any of your floors are uneven, fix them.  If there are any pets around you, be aware of where they are.  Review your medicines with your doctor. Some medicines can make you feel dizzy. This can increase your chance of falling. Ask your doctor what other things that you can do to help prevent falls. This information is not intended to replace advice given to you by your health care provider. Make sure you discuss any questions you have with your health care provider. Document Released: 01/08/2009 Document Revised: 08/20/2015 Document Reviewed: 04/18/2014 Elsevier Interactive Patient Education  [employer].

## 2016-09-22 NOTE — Progress Notes (Signed)
Agree with above and with recs to see PCP soon about the chronic esophagus pain and depression. I am happy to see her as well if needed.

## 2016-09-23 NOTE — Progress Notes (Signed)
I have reviewed documentation from this visit and I agree with recommendations given.  Drina G. Jordan, MD  [facility]. [place] office.   

## 2016-09-26 NOTE — Progress Notes (Signed)
HPI:   [name] is a 76 y.o. female, who is here today to follow on some concerns. I last saw her on 05/26/16 and then we planned on 6 months follow-up.  She had her Medicare preventive visit recently and states that some of the questions that were asked raised some concerns.  She has a long Hx of depression and anxiety, currently she is going through a hard time, dealing with her husband terminal illness. He has pancreatic cancer with metastasis and now under hospice care.  She is currently on Cymbalta 60 mg and Wellbutrin XL 300 mg daily, she has taken these meds for years. She and her daughter feel like her depression and anxiety are getting worse. She denies suicidal thoughts. She is not sleeping well mainly due to husband needs during night. Decreased appetite,no motivation, crying "a lot", not enjoying activities she use to among some.  She also would like to know who did her last colonoscopy, according to records it was done by Dr Deatra Ina and last one I can see was done in 2009. She states that she had one done about 3-4 years ago and not sure about f/u recommendations. C/O Constipation: "so bad" for several years.It is intermittent, she usually has bowel movements q 1-2 days but when she has episodes of constipation it can be several days with no bowel movement. Last bowel movement was 4-5 days ago, hard stools like "rocks."  Denies abdominal pain, nausea, vomiting, blood in stool or melena.  She has taken Miralax x 4 days now.  Chest "tightness": Lefts-sided intermittently for about 3 months.According to pt, problem started after her last OV. It happens at rest,when she is upset,and sometimes also with activities around her house. It is 3-4/10,lasts less than 30 min, no associated diaphoresis,dyspnea,palpitation,or dizziness. One time she felt soee "discomfort" on both arms but usually it is not radiated. Last chest pain episode was last week.  According to  pt, she had a stress test about 4 years ago and followed with cardiologists. She tells me that she was initially referred to cardiologists because an abnormal stress test but she was reassured and told that "everythinjg was [facility] Last visit about 2 years ago.  She has Hx of GERD, currently she is on Protonix 40 mg. Still has intermittent heartburn, "burning esophagus." Symptoms usually exacerbated by spicy food, [place] cuisine but she loves this type of food and still eats it.   She would like a referral to dermatologists because she "never had skin cancer check." Upon further questioning, she states that years go she had a "precancer" place frozen on her nose. She is also concerned about some lesions on her back. She has had lesions for years, new once appearing,not tender.  DM II: She is also concerns about her BS's, which she has ben checking more frequent now: FG 130-160. She states that she was taking medication, states that somehow she is not longer taking it,"I do not know how this happened." She was on Invokana but due to side effects (dehydration), med was discontinued over 6 months ago. Becasue her HgA1C was 5.7 in 07/2015 we agreed on continuing with non pharmacologic treatment. Last HgA1C on 05/26/2016 was 6.1. Hx of diabetic polyneuropathy.  She denies polyuria ,polyphagia, or polydipsia. She does not exercise regularly because Hx of generalized OA, she uses a cane to help with transfer.   Review of Systems  Constitutional: Positive for appetite change and fatigue. Negative for activity change,  diaphoresis and fever.  HENT: Negative for mouth sores, nosebleeds, trouble swallowing and voice change.   Eyes: Negative for redness and visual disturbance.  Respiratory: Negative for cough, shortness of breath and wheezing.   Cardiovascular: Positive for chest pain. Negative for palpitations and leg swelling.  Gastrointestinal: Positive for constipation. Negative for abdominal pain, blood in  stool, nausea and vomiting.       Negative for changes in bowel habits.  Endocrine: Negative for cold intolerance, heat intolerance, polydipsia, polyphagia and polyuria.  Genitourinary: Negative for decreased urine volume, difficulty urinating, dysuria and hematuria.  Musculoskeletal: Positive for arthralgias, back pain and gait problem.       Hx of generalized OA  Skin: Negative for rash and wound.  Neurological: Negative for syncope, weakness and headaches.  Psychiatric/Behavioral: Positive for sleep disturbance. Negative for confusion, hallucinations and suicidal ideas. The patient is nervous/anxious.       Current Outpatient Prescriptions on File Prior to Visit  Medication Sig Dispense Refill  . allopurinol (ZYLOPRIM) 100 MG tablet Take 1 tablet (100 mg total) by mouth 3 (three) times daily. 180 tablet 1  . aspirin EC 81 MG tablet Take 81 mg by mouth daily.    Marland Kitchen atorvastatin (LIPITOR) 40 MG tablet Take 1 tablet (40 mg total) by mouth daily. 90 tablet 1  . benazepril (LOTENSIN) 20 MG tablet Take 1 tablet (20 mg total) by mouth daily. 90 tablet 1  . buPROPion (WELLBUTRIN XL) 300 MG 24 hr tablet Take 1 tablet (300 mg total) by mouth daily. 90 tablet 2  . Cholecalciferol (VITAMIN D3) 5000 units CAPS Take 1 capsule by mouth daily.    . clopidogrel (PLAVIX) 75 MG tablet TAKE ONE TABLET BY MOUTH DAILY 90 tablet 1  . DULoxetine (CYMBALTA) 60 MG capsule TAKE ONE CAPSULE BY MOUTH DAILY 90 capsule 1  . glucose blood (ACCU-CHEK AVIVA) test strip Use to test blood sugar once daily. 100 each 2  . L-Lysine 500 MG CAPS Take 1 capsule by mouth daily.    Elmore Guise Devices (ACCU-CHEK SOFTCLIX) lancets Use to test blood sugar once a day. 100 each 2  . latanoprost (XALATAN) 0.005 % ophthalmic solution Place 1 drop into both eyes at bedtime.    . pantoprazole (PROTONIX) 40 MG tablet TAKE 1 TABLET (40 MG TOTAL) BY MOUTH DAILY 90 tablet 1  . triamcinolone cream (KENALOG) 0.5 % Apply 1 application topically 2  (two) times daily.     No current facility-administered medications on file prior to visit.      Past Medical History:  Diagnosis Date  . Chicken pox   . Depression   . Diabetes mellitus without complication ([place])   . GERD (gastroesophageal reflux disease)   . Hyperlipidemia   . Hypertension   . Stroke ([place])   . UTI (urinary tract infection)    No Known Allergies  Family History  Problem Relation Age of Onset  . Hyperlipidemia Mother   . Heart disease Mother   . Stroke Mother   . Hypertension Mother   . Hyperlipidemia Father   . Heart disease Father   . Stroke Father   . Hypertension Father   . Asthma Father   . Parkinson's disease Sister     Social History   Social History  . Marital status: Married    Spouse name: N/A  . Number of children: N/A  . Years of education: N/A   Occupational History  . N/A    Social History Main Topics  .  Smoking status: Never Smoker  . Smokeless tobacco: Never Used  . Alcohol use Yes     Comment: occassional wine  . Drug use: No  . Sexual activity: Not Currently   Other Topics Concern  . None   Social History Narrative  . None    Vitals:   09/27/16 0848  BP: 120/72  Pulse: 100  Resp: 12  O2 sat at RA 96% Body mass index is 25.86 kg/m.   Physical Exam  Nursing note and vitals reviewed. Constitutional: She is oriented to person, place, and time. She appears well-developed and well-nourished. No distress.  HENT:  Head: Atraumatic.  Mouth/Throat: Oropharynx is clear and moist and mucous membranes are normal.  Eyes: Conjunctivae and EOM are normal.  Cardiovascular: Normal rate and regular rhythm.   Occasional extrasystoles are present.  No murmur heard. Pulses:      Dorsalis pedis pulses are 2+ on the right side, and 2+ on the left side.  Respiratory: Effort normal and breath sounds normal. No respiratory distress. She exhibits no tenderness.  GI: Soft. She exhibits no mass. There is no hepatomegaly. There is  no tenderness.  Musculoskeletal: She exhibits no edema.  Lymphadenopathy:    She has no cervical adenopathy.  Neurological: She is alert and oriented to person, place, and time. She has normal strength. Coordination normal.  Gait stable assisted by a cane.  Skin: Skin is warm. Lesion noted. No rash noted. No erythema.  Scattered raised, mildly hyperpigmented lesions on upper and lower back , also some on abdomen. Regular borders, 0.5-2 cm. No suspicious lesions appreciated.   Psychiatric: Her mood appears anxious. She expresses no suicidal ideation.  Fairly groomed, good eye contact.    ASSESSMENT AND PLAN:  Ms. Bellanger was seen today for follow-up.  Diagnoses and all orders for this visit:  Chest tightness  We discussed possible etiologies, including GERD,cardiac,anxiety among some. Given her Hx of DM II among other risk factors cardiology consultation recommended. EKG today: SR,normal intervals and axis, PVC's, unspe T wave abn. Compared with EKG 12/2015 no significant changes except for PVC now present and T wave abn now present (V3 mainly).  -     EKG 12-Lead -     Ambulatory referral to Cardiology  Gastroesophageal reflux disease, esophagitis presence not specified  Not well controlled. Could be contributing to chest discomfort. GERD precautions discussed. Complications of chronic esophageal irritation discussed as well as side effects of PPI's. No changes in current management.   DM (diabetes mellitus), type 2 with neurological complications ([place])  Today A1C here today still at goa, 5.9. Continue non pharmacologic treatment. We reviewed prior visits and clarified reason for which Invokana was discontinued a few months ago. F/U in 6 months.  -     POCT glycosylated hemoglobin (Hb A1C)  Seborrheic keratosis  Reassured. Dx discussed. As requested referral to derma placed.  -     Ambulatory referral to Dermatology  Moderate episode of recurrent major  depressive disorder ([place])  Not well controlled. We discussed Dx criteria as well as grieving response, the latter one being an expected response. She will benefits from physiotherapy, referral placed. She agrees with trying low dose Buspar , some side effects discussed, instructed about warning signs.  -     Ambulatory referral to Psychiatry -     busPIRone (BUSPAR) 5 MG tablet; Take 1 tablet (5 mg total) by mouth 2 (two) times daily.  H/O actinic keratosis  Dx discussed as well as skin  cancer prevention.  -     Ambulatory referral to Dermatology   40 min face to face OV. > 50% was dedicated to discussion of Dx listed above, prognosis, treatment options for some,review of side effects,as well as coordination of care.Extra time was necessary for reassurance, side effects of medication, DM management and goals,CV risk factors, depression management and treatment options among some. In regard to colonoscopy, we reviewed the one I have available, we will try to obtain the one she had done 3-4 years ago.    -Ms. Oneal Biglow Brown was advised to return sooner than planned today if new concerns arise.       Saryna G. Martinique, MD  [facility]. [place] office.

## 2016-09-27 ENCOUNTER — Ambulatory Visit (INDEPENDENT_AMBULATORY_CARE_PROVIDER_SITE_OTHER): Payer: Medicare Other | Admitting: Family Medicine

## 2016-09-27 ENCOUNTER — Encounter: Payer: Self-pay | Admitting: Family Medicine

## 2016-09-27 VITALS — BP 120/72 | HR 100 | Resp 12 | Ht 67.0 in | Wt 165.1 lb

## 2016-09-27 DIAGNOSIS — K219 Gastro-esophageal reflux disease without esophagitis: Secondary | ICD-10-CM

## 2016-09-27 DIAGNOSIS — E1149 Type 2 diabetes mellitus with other diabetic neurological complication: Secondary | ICD-10-CM | POA: Diagnosis not present

## 2016-09-27 DIAGNOSIS — F331 Major depressive disorder, recurrent, moderate: Secondary | ICD-10-CM

## 2016-09-27 DIAGNOSIS — Z872 Personal history of diseases of the skin and subcutaneous tissue: Secondary | ICD-10-CM

## 2016-09-27 DIAGNOSIS — L821 Other seborrheic keratosis: Secondary | ICD-10-CM | POA: Diagnosis not present

## 2016-09-27 DIAGNOSIS — R0789 Other chest pain: Secondary | ICD-10-CM

## 2016-09-27 MED ORDER — BUSPIRONE HCL 5 MG PO TABS: 5.0000 mg | ORAL_TABLET | Freq: Two times a day (BID) | ORAL | 1 refills | Status: DC

## 2016-09-27 NOTE — Patient Instructions (Addendum)
A few things to remember from today's visit:   Chest tightness - Plan: EKG 12-Lead, Ambulatory referral to Cardiology  H/O actinic keratosis - Plan: Ambulatory referral to Dermatology  Seborrheic keratosis - Plan: Ambulatory referral to Dermatology  Moderate episode of recurrent major depressive disorder ([place]) - Plan: Ambulatory referral to Psychiatry  DM (diabetes mellitus), type 2 with neurological complications ([place]) - Plan: POCT glycosylated hemoglobin (Hb A1C)  Gastroesophageal reflux disease, esophagitis presence not specified  Buspar added.   Avoid foods that make your symptoms worse, for example coffee, chocolate,pepermeint,alcohol, and greasy food. Raising the head of your bed about 6 inches may help with nocturnal symptoms.  Avoid tobacco use. Weight loss (if you are overweight). Avoid lying down for 3 hours after eating.  Instead 3 large meals daily try small and more frequent meals during the day.   You should be evaluated immediately if bloody vomiting, bloody stools, black stools (like tar), difficulty swallowing, food gets stuck on the way down or choking when eating. Abnormal weight loss or severe abdominal pain.  Lab Results  Component Value Date   HGBA1C 5.9 09/27/2016     Please be sure medication list is accurate. If a new problem present, please set up appointment sooner than planned today.

## 2016-10-20 ENCOUNTER — Other Ambulatory Visit: Payer: Self-pay | Admitting: Family Medicine

## 2016-11-01 ENCOUNTER — Other Ambulatory Visit: Payer: Self-pay | Admitting: Family Medicine

## 2016-11-01 DIAGNOSIS — Z1231 Encounter for screening mammogram for malignant neoplasm of breast: Secondary | ICD-10-CM

## 2016-11-01 DIAGNOSIS — E2839 Other primary ovarian failure: Secondary | ICD-10-CM

## 2016-11-03 ENCOUNTER — Other Ambulatory Visit: Payer: Self-pay | Admitting: Family Medicine

## 2016-11-14 ENCOUNTER — Ambulatory Visit: Payer: Medicare Other | Admitting: Interventional Cardiology

## 2016-11-17 ENCOUNTER — Ambulatory Visit
Admission: RE | Admit: 2016-11-17 | Discharge: 2016-11-17 | Disposition: A | Discharge status: Home / Self Care | Payer: Medicare Other | Source: Ambulatory Visit | Attending: Family Medicine | Admitting: Family Medicine

## 2016-11-17 ENCOUNTER — Encounter: Payer: Self-pay | Admitting: Family Medicine

## 2016-11-17 DIAGNOSIS — Z1231 Encounter for screening mammogram for malignant neoplasm of breast: Secondary | ICD-10-CM | POA: Diagnosis not present

## 2016-11-17 DIAGNOSIS — E2839 Other primary ovarian failure: Secondary | ICD-10-CM

## 2016-11-17 DIAGNOSIS — Z780 Asymptomatic menopausal state: Secondary | ICD-10-CM | POA: Diagnosis not present

## 2016-11-17 DIAGNOSIS — Z13820 Encounter for screening for osteoporosis: Secondary | ICD-10-CM | POA: Diagnosis not present

## 2016-11-18 ENCOUNTER — Encounter: Payer: Self-pay | Admitting: Interventional Cardiology

## 2016-11-22 DIAGNOSIS — L814 Other melanin hyperpigmentation: Secondary | ICD-10-CM | POA: Diagnosis not present

## 2016-11-22 DIAGNOSIS — L918 Other hypertrophic disorders of the skin: Secondary | ICD-10-CM | POA: Diagnosis not present

## 2016-11-22 DIAGNOSIS — L821 Other seborrheic keratosis: Secondary | ICD-10-CM | POA: Diagnosis not present

## 2016-11-22 DIAGNOSIS — D1801 Hemangioma of skin and subcutaneous tissue: Secondary | ICD-10-CM | POA: Diagnosis not present

## 2016-11-22 DIAGNOSIS — L820 Inflamed seborrheic keratosis: Secondary | ICD-10-CM | POA: Diagnosis not present

## 2016-12-09 ENCOUNTER — Other Ambulatory Visit: Payer: Self-pay | Admitting: Family Medicine

## 2016-12-09 ENCOUNTER — Ambulatory Visit (INDEPENDENT_AMBULATORY_CARE_PROVIDER_SITE_OTHER): Payer: Medicare Other | Admitting: Interventional Cardiology

## 2016-12-09 ENCOUNTER — Encounter: Payer: Self-pay | Admitting: Interventional Cardiology

## 2016-12-09 VITALS — BP 134/70 | HR 95 | Ht 67.0 in | Wt 164.8 lb

## 2016-12-09 DIAGNOSIS — I63429 Cerebral infarction due to embolism of unspecified anterior cerebral artery: Secondary | ICD-10-CM

## 2016-12-09 DIAGNOSIS — I674 Hypertensive encephalopathy: Secondary | ICD-10-CM

## 2016-12-09 DIAGNOSIS — E785 Hyperlipidemia, unspecified: Secondary | ICD-10-CM

## 2016-12-09 DIAGNOSIS — E7439 Other disorders of intestinal carbohydrate absorption: Secondary | ICD-10-CM | POA: Diagnosis not present

## 2016-12-09 DIAGNOSIS — W5503XA Scratched by cat, initial encounter: Secondary | ICD-10-CM | POA: Diagnosis not present

## 2016-12-09 DIAGNOSIS — N183 Chronic kidney disease, stage 3 unspecified: Secondary | ICD-10-CM

## 2016-12-09 DIAGNOSIS — R0789 Other chest pain: Secondary | ICD-10-CM

## 2016-12-09 DIAGNOSIS — S41102A Unspecified open wound of left upper arm, initial encounter: Secondary | ICD-10-CM | POA: Diagnosis not present

## 2016-12-09 DIAGNOSIS — S50812A Abrasion of left forearm, initial encounter: Secondary | ICD-10-CM | POA: Diagnosis not present

## 2016-12-09 DIAGNOSIS — S41101A Unspecified open wound of right upper arm, initial encounter: Secondary | ICD-10-CM | POA: Diagnosis not present

## 2016-12-09 DIAGNOSIS — G4733 Obstructive sleep apnea (adult) (pediatric): Secondary | ICD-10-CM | POA: Diagnosis not present

## 2016-12-09 NOTE — Progress Notes (Signed)
Cardiology Office Note    Date:  12/09/2016   ID:  [name], DOB 08/03/1940, MRN [id number]  PCP:  Brown, Karren G, MD  Cardiologist: Alexa Grooms, MD   Chief Complaint  Patient presents with  . Chest Pain  . Abnormal ECG    History of Present Illness:  Alexa Brown is a 76 y.o. female referred by Dr. Laticia Brown for evaluation of chest tightness.  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 For 6 months she has had a near continuous sensation of tightness in her chest that is aggravated by stress. Tightness bills in intensity and on occasion radiates into her arms. Prior to the past 6 months she only experienced the discomfort after exercise in the [employer] in [place]. [place]. She even states that she is having mild chest discomfort at the current time. There is no associated arm discomfort. She does equate dyspnea with the chest tightness. Climbing a flight of stairs causes much more dyspnea than she used to have although her level of conditioning has decreased over the past year because she had to care for her sick husband and could not exercise. She has never been a smoker.    Past Medical History:  Diagnosis Date  . Chicken pox   . Depression   . Diabetes mellitus without complication ([place])   . GERD (gastroesophageal reflux disease)   . Hyperlipidemia   . Hypertension   . Stroke ([place])   . UTI (urinary tract infection)     Past Surgical History:  Procedure Laterality Date  . ABDOMINAL HYSTERECTOMY    . APPENDECTOMY    . BREAST BIOPSY Left 2014  . BREAST SURGERY     biopsy    Current Medications: Outpatient Medications Prior to Visit  Medication Sig Dispense Refill  . allopurinol (ZYLOPRIM) 100 MG tablet TAKE 1 TABLET BY MOUTH THREE TIMES  A DAY 180 tablet 1  . aspirin EC 81 MG tablet Take 81 mg by mouth daily.    Marland Kitchen atorvastatin (LIPITOR) 40 MG tablet TAKE ONE TABLET BY MOUTH DAILY 90 tablet 1  . benazepril (LOTENSIN) 20 MG tablet TAKE ONE TABLET BY MOUTH DAILY 90 tablet 0  . buPROPion (WELLBUTRIN XL) 300 MG 24 hr tablet Take 1 tablet (300 mg total) by mouth daily. 90 tablet 2  . busPIRone (BUSPAR) 5 MG tablet Take 1 tablet (5 mg total) by mouth 2 (two) times daily. 60 tablet 1  . Cholecalciferol (VITAMIN D3) 5000 units CAPS Take 1 capsule by mouth daily.    . clopidogrel (PLAVIX) 75 MG tablet TAKE ONE TABLET BY MOUTH DAILY 90 tablet 1  . DULoxetine (CYMBALTA) 60 MG capsule TAKE ONE CAPSULE BY MOUTH DAILY 90 capsule 1  . glucose blood (ACCU-CHEK AVIVA) test strip Use to test blood sugar once daily. 100 each 2  . L-Lysine 500 MG CAPS Take 1 capsule by mouth daily.    Elmore Guise Devices (ACCU-CHEK SOFTCLIX) lancets Use to test blood sugar once a day. 100 each 2  . latanoprost (XALATAN) 0.005 % ophthalmic solution Place 1 drop into both eyes at bedtime.    . pantoprazole (PROTONIX) 40 MG tablet TAKE 1 TABLET (40 MG TOTAL) BY MOUTH DAILY 90 tablet 1  . triamcinolone cream (KENALOG) 0.5 % Apply 1 application topically 2 (two)  times daily as needed (pelling skin).      No facility-administered medications prior to visit.      Allergies:   Patient has no known allergies.   Social History   Social History  . Marital status: Married    Spouse name: N/A  . Number of children: N/A  . Years of education: N/A   Occupational History  . N/A    Social History Main Topics  . Smoking status: Never Smoker  . Smokeless tobacco: Never Used  . Alcohol use Yes     Comment: occassional wine  . Drug use: No  . Sexual activity: Not Currently   Other Topics Concern  . None   Social History Narrative  . None     Family History:  The patient's family history includes Asthma in her father; Heart disease in her father and mother;  Hyperlipidemia in her father and mother; Hypertension in her father and mother; Parkinson's disease in her sister; Stroke in her father and mother.   ROS:   Please see the history of present illness.    Dyspnea on exertion, depression, anxiety, back discomfort, right knee discomfort that she feels is arthritis, occasional dizziness, easy bruising difficulty with balance, anxiety, constipation, snoring, excessive sweating, and fatigue.  All other systems reviewed and are negative.   PHYSICAL EXAM:   VS:  BP 134/70 (BP Location: Left Arm)   Pulse 95   Ht 5\' 7"  (1.702 m)   Wt 164 lb 12.8 oz (74.8 kg)   BMI 25.81 kg/m    GEN: Well nourished, Elderly well developed, in no acute distress  HEENT: normal  Neck: no JVD, carotid bruits, or masses Cardiac: RRR; no murmurs, rubs> There is an S4 gallop. No edema.  Respiratory:  clear to auscultation bilaterally, normal work of breathing GI: soft, nontender, nondistended, + BS MS: no deformity or atrophy  Skin: warm and dry, no rash Neuro:  Alert and Oriented x 3, Strength and sensation are intact Psych: euthymic mood, full affect  Wt Readings from Last 3 Encounters:  12/09/16 164 lb 12.8 oz (74.8 kg)  09/27/16 165 lb 2 oz (74.9 kg)  09/22/16 165 lb 9.6 oz (75.1 kg)      Studies/Labs Reviewed:   EKG:  EKG  Performed in July 2018 demonstrates normal sinus rhythm, nonspecific T wave flattening. No obvious ischemic changes noted.  Recent Labs: 05/26/2016: BUN 15; Creatinine, Ser 1.00; Potassium 4.2; Sodium 140   Lipid Panel    Component Value Date/Time   CHOL 176 05/26/2016 1110   TRIG 98.0 05/26/2016 1110   HDL 71.50 05/26/2016 1110   CHOLHDL 2 05/26/2016 1110   VLDL 19.6 05/26/2016 1110   LDLCALC 84 05/26/2016 1110    Additional studies/ records that were reviewed today include:  No cardiac testing    ASSESSMENT:    1. Chest tightness   2. Systolic hypertension with cerebrovascular disease   3. Hyperlipidemia with target  LDL less than 70   4. Cerebrovascular accident (CVA) due to embolism of anterior cerebral artery, unspecified blood vessel laterality ([place])   5. Glucose intolerance   6. CKD (chronic kidney disease), stage III   7. OSA (obstructive sleep apnea)      PLAN:  In order of problems listed above:  1. Concerning for angina given the multitude of cardiac risk factors other present. Stress myocardial perfusion imaging is ordered. Agree with dual antiplatelet therapy, blood pressure control, and statin therapy for lipid management. 2. 140/90 mmHg or  less is to target. 3. LDL less than 70 should be the target given her prior history of stroke. 4. Not addressed 5. Not addressed 6. Not addressed 7. Not addressed  Continue current medical regimen. Myocardial perfusion imaging to exclude high risk subset. If high risk may need to have coronary angiography.    Medication Adjustments/Labs and Tests Ordered: Current medicines are reviewed at length with the patient today.  Concerns regarding medicines are outlined above.  Medication changes, Labs and Tests ordered today are listed in the Patient Instructions below. Patient Instructions  Medication Instructions:  None  Labwork: None  Testing/Procedures: Your physician has requested that you have en exercise stress myoview. For further information please visit [email or web address]. Please follow instruction sheet, as given.    Follow-Up: Your physician recommends that you schedule a follow-up appointment as needed with Dr. Tamala Julian.    Any Other Special Instructions Will Be Listed Below (If Applicable).     If you need a refill on your cardiac medications before your next appointment, please call your pharmacy.      Signed, Alexa Grooms, MD  12/09/2016 2:13 PM    [facility] [place], [place], [place]  [address] Phone: [phone]; Fax: [phone]

## 2016-12-09 NOTE — Patient Instructions (Signed)
Medication Instructions:  None  Labwork: None  Testing/Procedures: Your physician has requested that you have en exercise stress myoview. For further information please visit [email or web address]. Please follow instruction sheet, as given.   Follow-Up: Your physician recommends that you schedule a follow-up appointment as needed with Dr. Smith.    Any Other Special Instructions Will Be Listed Below (If Applicable).     If you need a refill on your cardiac medications before your next appointment, please call your pharmacy.   

## 2016-12-15 ENCOUNTER — Telehealth (HOSPITAL_COMMUNITY): Payer: Self-pay | Admitting: *Deleted

## 2016-12-15 ENCOUNTER — Encounter: Payer: Self-pay | Admitting: Family Medicine

## 2016-12-15 NOTE — Telephone Encounter (Signed)
Left message on voicemail per DPR in reference to upcoming appointment scheduled on  12/20/16 with detailed instructions given per Myocardial Perfusion Study Information Sheet for the test. LM to arrive 15 minutes early, and that it is imperative to arrive on time for appointment to keep from having the test rescheduled. If you need to cancel or reschedule your appointment, please call the office within 24 hours of your appointment. Failure to do so may result in a cancellation of your appointment, and a $50 no show fee. Phone number given for call back for any questions.  Kirstie Peri

## 2016-12-20 ENCOUNTER — Ambulatory Visit (HOSPITAL_COMMUNITY): Payer: Medicare Other | Attending: Cardiology

## 2016-12-20 DIAGNOSIS — R0789 Other chest pain: Secondary | ICD-10-CM | POA: Diagnosis not present

## 2016-12-20 LAB — MYOCARDIAL PERFUSION IMAGING
CHL CUP MPHR: 146 {beats}/min
CHL CUP NUCLEAR SDS: 2
CHL CUP NUCLEAR SRS: 2
CHL CUP NUCLEAR SSS: 4
CSEPEW: 4.7 METS
CSEPPHR: 144 {beats}/min
Exercise duration (min): 4 min
LHR: 0.29
LV dias vol: 57 mL (ref 46–106)
LV sys vol: 16 mL
NUC STRESS TID: 0.8
Percent HR: 98 %
RPE: 18
Rest HR: 63 {beats}/min

## 2016-12-20 MED ORDER — TECHNETIUM TC 99M TETROFOSMIN IV KIT
10.4000 | PACK | Freq: Once | INTRAVENOUS | Status: AC | PRN
  Administered 2016-12-20: 10.4 via INTRAVENOUS
  Filled 2016-12-20: qty 11

## 2016-12-20 MED ORDER — TECHNETIUM TC 99M TETROFOSMIN IV KIT
31.3000 | PACK | Freq: Once | INTRAVENOUS | Status: AC | PRN
  Administered 2016-12-20: 31.3 via INTRAVENOUS
  Filled 2016-12-20: qty 32

## 2016-12-21 ENCOUNTER — Other Ambulatory Visit: Payer: Self-pay | Admitting: Family Medicine

## 2016-12-21 DIAGNOSIS — F3341 Major depressive disorder, recurrent, in partial remission: Secondary | ICD-10-CM

## 2016-12-23 ENCOUNTER — Other Ambulatory Visit: Payer: Self-pay | Admitting: Family Medicine

## 2016-12-27 ENCOUNTER — Other Ambulatory Visit: Payer: Self-pay | Admitting: Family Medicine

## 2016-12-27 DIAGNOSIS — K219 Gastro-esophageal reflux disease without esophagitis: Secondary | ICD-10-CM

## 2017-01-23 ENCOUNTER — Encounter: Payer: Self-pay | Admitting: Family Medicine

## 2017-01-23 ENCOUNTER — Ambulatory Visit (INDEPENDENT_AMBULATORY_CARE_PROVIDER_SITE_OTHER): Payer: Medicare Other | Admitting: Family Medicine

## 2017-01-23 VITALS — BP 136/78 | HR 100 | Temp 98.1°F | Resp 12 | Ht 67.0 in | Wt 166.0 lb

## 2017-01-23 DIAGNOSIS — K219 Gastro-esophageal reflux disease without esophagitis: Secondary | ICD-10-CM | POA: Diagnosis not present

## 2017-01-23 DIAGNOSIS — E559 Vitamin D deficiency, unspecified: Secondary | ICD-10-CM

## 2017-01-23 DIAGNOSIS — R296 Repeated falls: Secondary | ICD-10-CM | POA: Diagnosis not present

## 2017-01-23 DIAGNOSIS — L219 Seborrheic dermatitis, unspecified: Secondary | ICD-10-CM | POA: Diagnosis not present

## 2017-01-23 DIAGNOSIS — G4700 Insomnia, unspecified: Secondary | ICD-10-CM

## 2017-01-23 DIAGNOSIS — F331 Major depressive disorder, recurrent, moderate: Secondary | ICD-10-CM

## 2017-01-23 DIAGNOSIS — I674 Hypertensive encephalopathy: Secondary | ICD-10-CM

## 2017-01-23 DIAGNOSIS — E1149 Type 2 diabetes mellitus with other diabetic neurological complication: Secondary | ICD-10-CM | POA: Diagnosis not present

## 2017-01-23 DIAGNOSIS — M549 Dorsalgia, unspecified: Secondary | ICD-10-CM

## 2017-01-23 LAB — COMPREHENSIVE METABOLIC PANEL
ALT: 23 U/L (ref 0–35)
AST: 22 U/L (ref 0–37)
Albumin: 4.3 g/dL (ref 3.5–5.2)
Alkaline Phosphatase: 62 U/L (ref 39–117)
BUN: 18 mg/dL (ref 6–23)
CALCIUM: 9.9 mg/dL (ref 8.4–10.5)
CHLORIDE: 106 meq/L (ref 96–112)
CO2: 26 mEq/L (ref 19–32)
CREATININE: 0.93 mg/dL (ref 0.40–1.20)
GFR: 62.53 mL/min (ref >60.00)
Glucose, Bld: 99 mg/dL (ref 70–99)
POTASSIUM: 4.5 meq/L (ref 3.5–5.1)
Sodium: 140 mEq/L (ref 135–145)
Total Bilirubin: 0.4 mg/dL (ref 0.2–1.2)
Total Protein: 6.7 g/dL (ref 6.0–8.3)

## 2017-01-23 LAB — HEMOGLOBIN A1C: Hgb A1c MFr Bld: 6.3 % (ref 4.6–6.5)

## 2017-01-23 LAB — VITAMIN D 25 HYDROXY (VIT D DEFICIENCY, FRACTURES): VITD: 43.34 ng/mL (ref 30.00–100.00)

## 2017-01-23 MED ORDER — DESONIDE 0.05 % EX CREA: TOPICAL_CREAM | Freq: Two times a day (BID) | CUTANEOUS | 1 refills | Status: DC | PRN

## 2017-01-23 MED ORDER — KETOCONAZOLE 2 % EX SHAM: 1.0000 "application " | MEDICATED_SHAMPOO | CUTANEOUS | 6 refills | Status: DC

## 2017-01-23 MED ORDER — RANITIDINE HCL 300 MG PO TABS: 300.0000 mg | ORAL_TABLET | Freq: Every day | ORAL | 2 refills | Status: DC

## 2017-01-23 NOTE — Progress Notes (Signed)
ACUTE VISIT   HPI:  Chief Complaint  Patient presents with  . Follow-up    Alexa Brown is a 76 y.o. female, who is here today concerned about "low BS's", 119. According to pt, she was told in the past she should have BS's at 131.  I saw her last on 09/27/16, when she was c/o chest tightness. She was evaluated by cardiologist and per pt report, work-up was negative. HTN, she is on Benazepril 20 mg daily . Denies severe/frequent headache, visual changes, chest pain, dyspnea,claudication, focal weakness, or edema.   She has Hx of GERD, Taken Protonix 40 mg daily. + Heartburn.  DM II, last HgA1C was 6.1 in 05/2016. She is on non pharmacologic treatment.  FG 90 to 119 Hx of peripheral neuropathy, burning sensation on feet has improved. Hx of CKD III, she has not noted gross hematuria or foam in urine.  C/O left cramps at night, wakes her up.This is chronic but seems to be worse for the past few days.  She is also requesting refills on Triamcinolone 0.5%, which she has used on her face for pruritic lesions and scaly lesions. + Scalp itching.  Problem has been present for many years.   + Insomnia, c/o having "a hard time falling asleep" but she once falls asleep she does so through the night. Sleeping about 5-6 hours. Anxiety and depression, the latter one worse since the death of her husband. She denies suicidal thoughts. She is receiving counseling through hospice. Her daughter moved with her. She is not eating well. No motivation, planning on starting water exercises. She is on Cymbalta 60 mg daily,Wellbutrin XL 300 mg,an Buspar 5 mg bid.   She is also c/o frequent falls, which has been going on for years but more frequent for the past 2 months.Last fall was last week at home and another one 3 weeks ago. She has ecchymosis on wrist, denies limitation,pain, of ROM or deformities. She has Hx of vit D deficiency, she is on Vit D 5000 U daily.  Back pain,  upper and lower, aching,intermittently since 09/2016.  It is not radiated. Denies saddle anesthesia or radiation to LE's. Tylenol helps.  Hx of OA,unstable gait,an neuropathy.She does not have her cane today.  She has completed PT before for fall prevention.    Review of Systems  Constitutional: Positive for activity change, appetite change and fatigue. Negative for fever.  HENT: Negative for mouth sores, nosebleeds, sore throat and trouble swallowing.   Eyes: Negative for redness and visual disturbance.  Respiratory: Negative for cough, shortness of breath and wheezing.   Cardiovascular: Negative for chest pain, palpitations and leg swelling.  Gastrointestinal: Negative for abdominal pain, nausea and vomiting.       Negative for changes in bowel habits.  Endocrine: Negative for cold intolerance, heat intolerance, polydipsia, polyphagia and polyuria.  Genitourinary: Negative for decreased urine volume, dysuria and hematuria.  Musculoskeletal: Positive for back pain, gait problem and myalgias.  Skin: Positive for rash. Negative for wound.  Neurological: Negative for syncope, weakness and headaches.  Hematological: Negative for adenopathy. Bruises/bleeds easily (On Plavix and Aspirin).  Psychiatric/Behavioral: Positive for sleep disturbance. Negative for confusion, hallucinations and suicidal ideas. The patient is nervous/anxious.       Current Outpatient Prescriptions on File Prior to Visit  Medication Sig Dispense Refill  . allopurinol (ZYLOPRIM) 100 MG tablet TAKE 1 TABLET BY MOUTH THREE TIMES A DAY 180 tablet 1  . aspirin EC 81 MG  tablet Take 81 mg by mouth daily.    Marland Kitchen atorvastatin (LIPITOR) 40 MG tablet TAKE ONE TABLET BY MOUTH DAILY 90 tablet 1  . benazepril (LOTENSIN) 20 MG tablet TAKE ONE TABLET BY MOUTH DAILY 90 tablet 0  . buPROPion (WELLBUTRIN XL) 300 MG 24 hr tablet TAKE ONE TABLET BY MOUTH DAILY 90 tablet 1  . calcium carbonate (CALCIUM 600) 600 MG TABS tablet Take 600  mg by mouth daily with breakfast.    . Cholecalciferol (VITAMIN D3) 5000 units CAPS Take 1 capsule by mouth daily.    . clopidogrel (PLAVIX) 75 MG tablet TAKE ONE TABLET BY MOUTH DAILY 90 tablet 1  . DULoxetine (CYMBALTA) 60 MG capsule TAKE ONE CAPSULE BY MOUTH DAILY 90 capsule 1  . glucose blood (ACCU-CHEK AVIVA) test strip Use to test blood sugar once daily. 100 each 2  . L-Lysine 500 MG CAPS Take 1 capsule by mouth daily.    Elmore Guise Devices (ACCU-CHEK SOFTCLIX) lancets Use to test blood sugar once a day. 100 each 2  . latanoprost (XALATAN) 0.005 % ophthalmic solution Place 1 drop into both eyes at bedtime.    . pantoprazole (PROTONIX) 40 MG tablet TAKE ONE TABLET BY MOUTH DAILY 90 tablet 2  . PRESCRIPTION MEDICATION Take 1 capsule by mouth 2 (two) times daily. New remedy vitamin mixture for neuropathy     No current facility-administered medications on file prior to visit.      Past Medical History:  Diagnosis Date  . Chicken pox   . Depression   . Diabetes mellitus without complication ([place])   . GERD (gastroesophageal reflux disease)   . Hyperlipidemia   . Hypertension   . Stroke ([place])   . UTI (urinary tract infection)    Past Surgical History:  Procedure Laterality Date  . ABDOMINAL HYSTERECTOMY    . APPENDECTOMY    . BREAST BIOPSY Left 2014  . BREAST SURGERY     biopsy    No Known Allergies    Family History  Problem Relation Age of Onset  . Hyperlipidemia Mother   . Heart disease Mother   . Stroke Mother   . Hypertension Mother   . Hyperlipidemia Father   . Heart disease Father   . Stroke Father   . Hypertension Father   . Asthma Father   . Parkinson's disease Sister     Social History   Social History  . Marital status: Married    Spouse name: N/A  . Number of children: N/A  . Years of education: N/A   Occupational History  . N/A    Social History Main Topics  . Smoking status: Never Smoker  . Smokeless tobacco: Never Used  . Alcohol use Yes       Comment: occassional wine  . Drug use: No  . Sexual activity: Not Currently   Other Topics Concern  . None   Social History Narrative  . None    Vitals:   01/23/17 1156  BP: 136/78  Pulse: 100  Resp: 12  Temp: 98.1 F (36.7 C)  SpO2: 96%   Body mass index is 26 kg/m.   Physical Exam  Nursing note and vitals reviewed. Constitutional: She is oriented to person, place, and time. She appears well-developed. No distress.  HENT:  Head: Normocephalic and atraumatic.  Mouth/Throat: Oropharynx is clear and moist and mucous membranes are normal.  Eyes: Pupils are equal, round, and reactive to light. Conjunctivae are normal.  Cardiovascular: Normal rate and regular  rhythm.   No murmur heard. Pulses:      Dorsalis pedis pulses are 2+ on the right side, and 2+ on the left side.  Respiratory: Effort normal and breath sounds normal. No respiratory distress.  GI: Soft. She exhibits no mass. There is no hepatomegaly. There is no tenderness.  Musculoskeletal: She exhibits no edema.       Thoracic back: She exhibits no tenderness and no bony tenderness.       Lumbar back: She exhibits no tenderness and no bony tenderness.  Lymphadenopathy:    She has no cervical adenopathy.  Neurological: She is alert and oriented to person, place, and time. She has normal strength. Coordination and gait normal.  No assisted ,stable gait.  Skin: Skin is warm. Ecchymosis (Left wrist and forearm.) and rash noted. Rash is macular. No erythema.  In between eye brows with mild macular erythema and fine scaly areas.  Psychiatric: Her mood appears anxious. Her affect is labile. She exhibits a depressed mood. She expresses no suicidal ideation.  Well groomed, good eye contact.    ASSESSMENT AND PLAN:   Ms. Chyler was seen today for follow-up.  Diagnoses and all orders for this visit:  Lab Results  Component Value Date   HGBA1C 6.3 01/23/2017   Lab Results  Component Value Date   CREATININE  0.93 01/23/2017   BUN 18 01/23/2017   NA 140 01/23/2017   K 4.5 01/23/2017   CL 106 01/23/2017   CO2 26 01/23/2017   Lab Results  Component Value Date   ALT 23 01/23/2017   AST 22 01/23/2017   ALKPHOS 62 01/23/2017   BILITOT 0.4 01/23/2017    DM (diabetes mellitus), type 2 with neurological complications ([place])  HMC9O has been at goal with non pharmacologic treatment.Educated about glucose goals and hypoglycemia definition.  Regular exercise and healthy diet with avoidance of added sugar food intake is an important part of treatment and recommended. Annual eye exam, periodic dental and foot care recommended. F/U in 5-6 months  -     Hemoglobin A1c -     Comprehensive metabolic panel  Gastroesophageal reflux disease, esophagitis presence not specified  Still symptomatic. Ranitidine 300 mg at bedtime added. GERD precautions also recommended. No changes in Protonix. F/U in 6-8 weeks.  -     ranitidine (ZANTAC) 300 MG tablet; Take 1 tablet (300 mg total) by mouth at bedtime.  Insomnia, unspecified type  OTC Melatonin ER 3-5 mg at bedtime recommended. Good sleep hygiene. F/U in 6-8 weeks.  Seborrheic dermatitis  Educated about Dx and side effects of topical steroid. Desonide as needed, small amount. Stop Triamcinolone. Ketoconazole shampoo. F/U as needed.  -     ketoconazole (NIZORAL) 2 % shampoo; Apply 1 application topically 2 (two) times a week. -     desonide (DESOWEN) 0.05 % cream; Apply topically 2 (two) times daily as needed.  Vitamin D deficiency  No changes in current management, will follow labs done today and will give further recommendations accordingly. F/U in 6-12 months.  -     VITAMIN D 25 Hydroxy (Vit-D Deficiency, Fractures)  Systolic hypertension with cerebrovascular disease  Adequately controlled. No changes in current management. DASH diet recommended. Eye exam recommended annually. F/U in 6 months, before if needed.  Moderate episode  of recurrent major depressive disorder (HCC)  Exacerbated by her husband death. She is not suicidal. Buspar wean off. No changes in Wellbutrin or Cymbalta. Continue counseling. Instructed about warning signs. F/U in 6-8  weeks,before if needed.  Falls frequently  Fall prevention discussed. Meds and chronic medical problems can aggravate problem.  She has done PT. Instructed to use her cane.  Bilateral back pain, unspecified back location, unspecified chronicity  Examination today does not suggest serious process, most likely musculoskeletal. I do not think imaging is needed. Hx of OA. Continue Tylenol and may also help OTC Icy hot or Asper cream. Instructed about warning signs. F/U as needed.  12:14 pm to 1:02 pm 40 min face to face OV. > 50% was dedicated to discussion of Dx,prognosis, reviewed medications and side effects, some may increase risk of falls. As well as coordination of care.    Return in about 7 weeks (around 03/13/2017) for Depression,insomnia.     -Ms.Jaeline Whobrey Burnsed-Siders was advised to seek immediate medical attention if sudden worsening symptoms.      Crystalle G. Martinique, MD  [facility]. [place] office.

## 2017-01-23 NOTE — Patient Instructions (Addendum)
A few things to remember from today's visit:   Gastroesophageal reflux disease, esophagitis presence not specified - Plan: ranitidine (ZANTAC) 300 MG tablet  DM (diabetes mellitus), type 2 with neurological complications ([place]) - Plan: Hemoglobin A1c, Comprehensive metabolic panel  Seborrheic dermatitis - Plan: ketoconazole (NIZORAL) 2 % shampoo, desonide (DESOWEN) 0.05 % cream  Vitamin D deficiency - Plan: VITAMIN D 25 Hydroxy (Vit-D Deficiency, Fractures)  Buspar to wean off. Melatonin ER 3-5 mg at bedtime.   GERD:  Avoid foods that make your symptoms worse, for example coffee, chocolate,pepermeint,alcohol, and greasy food. Raising the head of your bed about 6 inches may help with nocturnal symptoms.  Avoid tobacco use. Weight loss (if you are overweight). Avoid lying down for 3 hours after eating.  Instead 3 large meals daily try small and more frequent meals during the day.   You should be evaluated immediately if bloody vomiting, bloody stools, black stools (like tar), difficulty swallowing, food gets stuck on the way down or choking when eating. Abnormal weight loss or severe abdominal pain.  If symptoms are not resolved sometimes endoscopy is necessary.   Please be sure medication list is accurate. If a new problem present, please set up appointment sooner than planned today.

## 2017-01-24 ENCOUNTER — Encounter: Payer: Self-pay | Admitting: Family Medicine

## 2017-01-26 ENCOUNTER — Telehealth: Payer: Self-pay | Admitting: Family Medicine

## 2017-01-26 NOTE — Telephone Encounter (Signed)
° ° ° °  Pt said her insurance will not cover the below med and is asking for something else to be called in    desonide (DESOWEN) 0.05 % cream    [place]

## 2017-01-27 ENCOUNTER — Other Ambulatory Visit: Payer: Self-pay | Admitting: Family Medicine

## 2017-01-27 DIAGNOSIS — L219 Seborrheic dermatitis, unspecified: Secondary | ICD-10-CM

## 2017-01-27 MED ORDER — TRIAMCINOLONE ACETONIDE 0.025 % EX CREA
1.0000 | TOPICAL_CREAM | Freq: Two times a day (BID) | CUTANEOUS | 1 refills | Status: DC | PRN
Start: 2017-01-27 — End: 2018-12-18

## 2017-01-27 NOTE — Telephone Encounter (Signed)
Rx for Triamcinolone was sent to her pharmacy.  Thanks, BJ

## 2017-03-05 ENCOUNTER — Other Ambulatory Visit: Payer: Self-pay | Admitting: Family Medicine

## 2017-04-21 ENCOUNTER — Other Ambulatory Visit: Payer: Self-pay | Admitting: Family Medicine

## 2017-04-21 DIAGNOSIS — K219 Gastro-esophageal reflux disease without esophagitis: Secondary | ICD-10-CM

## 2017-05-08 ENCOUNTER — Other Ambulatory Visit: Payer: Self-pay | Admitting: Family Medicine

## 2017-05-08 DIAGNOSIS — H2513 Age-related nuclear cataract, bilateral: Secondary | ICD-10-CM | POA: Diagnosis not present

## 2017-05-08 DIAGNOSIS — H25013 Cortical age-related cataract, bilateral: Secondary | ICD-10-CM | POA: Diagnosis not present

## 2017-05-08 DIAGNOSIS — H40033 Anatomical narrow angle, bilateral: Secondary | ICD-10-CM | POA: Diagnosis not present

## 2017-05-08 DIAGNOSIS — E119 Type 2 diabetes mellitus without complications: Secondary | ICD-10-CM | POA: Diagnosis not present

## 2017-05-29 DIAGNOSIS — H2513 Age-related nuclear cataract, bilateral: Secondary | ICD-10-CM | POA: Diagnosis not present

## 2017-05-29 DIAGNOSIS — H25013 Cortical age-related cataract, bilateral: Secondary | ICD-10-CM | POA: Diagnosis not present

## 2017-06-07 ENCOUNTER — Other Ambulatory Visit: Payer: Self-pay | Admitting: Family Medicine

## 2017-07-07 ENCOUNTER — Other Ambulatory Visit: Payer: Self-pay | Admitting: Family Medicine

## 2017-07-07 DIAGNOSIS — F3341 Major depressive disorder, recurrent, in partial remission: Secondary | ICD-10-CM

## 2017-07-10 NOTE — Telephone Encounter (Signed)
I sent Rx's she is requesting. Explained that when I saw her in 12/2017,she was supposed to arrange 4 weeks follow up and it has been 6 months. She needs f/u appt this month, before she needs more refills.  Thanks, BJ

## 2017-07-11 NOTE — Telephone Encounter (Signed)
Informed patient that Rx was sent to pharmacy and that Dr. Martinique stated that she needed a follow-up appointment this month. Pt verbalized understanding, appointment scheduled for 07/21/17.

## 2017-07-20 NOTE — Progress Notes (Signed)
HPI:   [name] is a 77 y.o. female, who is here today with her daughter for 6 months follow up.   She was last seen in 12/2016, she was c/o worsen ing depression, aggravated by husband's death. She is on Wellbutrin XL 300 mg daily and Cymbalta 60 mg daily, she does not think these medications are helping. + Crying spells and difficulty falling asleep. Denies suicidal thoughts.  She is not longer attending group counseling provide by hospice after her husband's death.   Diabetes Mellitus II:   Currently on non pharmacologic treatment.. Problem has been stable. Checking BS's : FG 120's. Hypoglycemia: No   No abdominal pain, nausea, vomiting, polydipsia, polyuria, or polyphagia.  Hx of peripheral neuropathy.    Lab Results  Component Value Date   CREATININE 0.93 01/23/2017   BUN 18 01/23/2017   NA 140 01/23/2017   K 4.5 01/23/2017   CL 106 01/23/2017   CO2 26 01/23/2017    Lab Results  Component Value Date   HGBA1C 6.3 01/23/2017   Lab Results  Component Value Date   MICROALBUR <0.7 01/14/2016   HTN and CKD III: She is on Benazepril 40 mg daily.  Negative for visual changes, chest pain, dyspnea, palpitation,or edema. 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   Vit D def,she is on OTC Vit D 5000 daily. Well tolerated,no side effects reported.  She has several concerns today: Memory problems,headache,lower back pain,persistent scalp pruritus,and fatigue.She wonders if these are related to her DM 2 being not well controlled.  Lower back pain, bilateral,which she is reporting as a new problem that started about 3-4 months. Pain is intermittent, achy,radiated to both LE's.  + Numbness of both LE's,lateral aspect of thighs and feels like her legs "are weak." Hx of CVA with residual LLE weakness,her daughter has noted that she is "dragging" her feet, L>R more than her baseline. When inquire about urine and bowel incontinence,she reports having  both. + Urinary urgency,which seems to be chronic. 4 months of stool incontinence. Denies saddle anesthesia.  Exacerbated by prolonged walking and standing (15 min), alleviated by rest.    -Concerned about forgetfulness worse for the past few months. Misplacing things, in the middle of a conversation she forgets what she is going to say.Her daughter has not noted repetition of same statements during conversation.  Intermittent lightheadedness and dizziness, which she has had in the past.   1-2 times falls since her last visit, decreased frequency since we adjusted her meds last visit.  + Fatigue.  Headache, states that she has Hx of migraines but did not have problems for years. Started having headaches again about 1-2 months ago. Frontal ,dull pain, 3-4/10, 1-2 per week, +photophobia.Negative for nausea or vomiting. She has not noted exacerbating factors.  She takes Tylenol.  Pruritic and scaly scalp that did not improve with shampoo and topical steroid prescribed in 12/2016,seborrheic dermatitis. She has problem for years. No tenderness.   Review of Systems  Constitutional: Positive for fatigue. Negative for activity change, appetite change and fever.  HENT: Negative for mouth sores, nosebleeds, sore throat and trouble swallowing.   Eyes: Negative for redness and visual disturbance.  Respiratory: Negative for cough, shortness of breath and wheezing.   Cardiovascular: Negative for chest pain, palpitations and leg swelling.  Gastrointestinal: Negative for abdominal pain, nausea and vomiting.       Negative for changes in bowel habits.  Endocrine: Negative for polydipsia, polyphagia and polyuria.  Genitourinary: Negative for decreased urine volume, dysuria and hematuria.  Musculoskeletal: Positive for back pain and gait problem.  Skin: Positive for rash. Negative for wound.  Neurological: Positive for light-headedness, numbness and headaches. Negative for syncope and weakness.    Psychiatric/Behavioral: Negative for confusion and suicidal ideas. The patient is nervous/anxious.      Current Outpatient Medications on File Prior to Visit  Medication Sig Dispense Refill  . allopurinol (ZYLOPRIM) 100 MG tablet TAKE ONE TABLET BY MOUTH THREE TIMES A DAY 270 tablet 0  . atorvastatin (LIPITOR) 40 MG tablet TAKE ONE TABLET BY MOUTH DAILY 90 tablet 0  . benazepril (LOTENSIN) 20 MG tablet TAKE ONE TABLET BY MOUTH DAILY 90 tablet 0  . calcium carbonate (CALCIUM 600) 600 MG TABS tablet Take 600 mg by mouth daily with breakfast.    . Cholecalciferol (VITAMIN D3) 5000 units CAPS Take 1 capsule by mouth daily.    . clopidogrel (PLAVIX) 75 MG tablet TAKE ONE TABLET BY MOUTH DAILY 30 tablet 0  . DULoxetine (CYMBALTA) 60 MG capsule TAKE ONE CAPSULE BY MOUTH DAILY 30 capsule 0  . glucose blood (ACCU-CHEK AVIVA) test strip Use to test blood sugar once daily. 100 each 2  . L-Lysine 500 MG CAPS Take 1 capsule by mouth daily.    Elmore Guise Devices (ACCU-CHEK SOFTCLIX) lancets Use to test blood sugar once a day. 100 each 2  . latanoprost (XALATAN) 0.005 % ophthalmic solution Place 1 drop into both eyes at bedtime.    . pantoprazole (PROTONIX) 40 MG tablet TAKE ONE TABLET BY MOUTH DAILY 90 tablet 2  . PRESCRIPTION MEDICATION Take 1 capsule by mouth 2 (two) times daily. New remedy vitamin mixture for neuropathy    . ranitidine (ZANTAC) 300 MG tablet TAKE ONE TABLET BY MOUTH AT BEDTIME 90 tablet 1  . triamcinolone (KENALOG) 0.025 % cream Apply 1 application topically 2 (two) times daily as needed. 30 g 1   No current facility-administered medications on file prior to visit.      Past Medical History:  Diagnosis Date  . Chicken pox   . Depression   . Diabetes mellitus without complication ([place])   . GERD (gastroesophageal reflux disease)   . Hyperlipidemia   . Hypertension   . Stroke ([place])   . UTI (urinary tract infection)    Past Surgical History:  Procedure Laterality Date  .  ABDOMINAL HYSTERECTOMY    . APPENDECTOMY    . BREAST BIOPSY Left 2014  . BREAST SURGERY     biopsy    No Known Allergies  Social History   Socioeconomic History  . Marital status: Married    Spouse name: Not on file  . Number of children: Not on file  . Years of education: Not on file  . Highest education level: Not on file  Occupational History  . Occupation: N/A  Social Needs  . Financial resource strain: Not on file  . Food insecurity:    Worry: Not on file    Inability: Not on file  . Transportation needs:    Medical: Not on file    Non-medical: Not on file  Tobacco Use  . Smoking status: Never Smoker  . Smokeless tobacco: Never Used  Substance and Sexual Activity  . Alcohol use: Yes    Comment: occassional wine  . Drug use: No  . Sexual activity: Not Currently  Lifestyle  . Physical activity:    Days per week: Not on file    Minutes per  session: Not on file  . Stress: Not on file  Relationships  . Social connections:    Talks on phone: Not on file    Gets together: Not on file    Attends religious service: Not on file    Active member of club or organization: Not on file    Attends meetings of clubs or organizations: Not on file    Relationship status: Not on file  Other Topics Concern  . Not on file  Social History Narrative  . Not on file   Family History  Problem Relation Age of Onset  . Hyperlipidemia Mother   . Heart disease Mother   . Stroke Mother   . Hypertension Mother   . Hyperlipidemia Father   . Heart disease Father   . Stroke Father   . Hypertension Father   . Asthma Father   . Parkinson's disease Sister     Vitals:   07/21/17 1458  BP: 130/82  Pulse: 93  Resp: 12  Temp: 98.1 F (36.7 C)  SpO2: 96%   Body mass index is 24.9 kg/m.   Physical Exam  Nursing note and vitals reviewed. Constitutional: She is oriented to person, place, and time. She appears well-developed. No distress.  HENT:  Head: Normocephalic and  atraumatic.  Mouth/Throat: Oropharynx is clear and moist and mucous membranes are normal.  Eyes: Pupils are equal, round, and reactive to light. Conjunctivae are normal.  Cardiovascular: Normal rate and regular rhythm.  No murmur heard. Pulses:      Dorsalis pedis pulses are 2+ on the right side, and 2+ on the left side.  Respiratory: Effort normal and breath sounds normal. No respiratory distress.  GI: Soft. She exhibits no mass. There is no hepatomegaly. There is no tenderness.  Musculoskeletal: She exhibits no edema.       Thoracic back: She exhibits no tenderness and no bony tenderness.       Lumbar back: She exhibits no tenderness and no bony tenderness.  Lymphadenopathy:    She has no cervical adenopathy.  Neurological: She is alert and oriented to person, place, and time. Gait abnormal.  No focal deficit appreciated. She remembers date but it took her a few seconds to remember. Unstable gait assisted by cane.  Skin: Skin is warm. No erythema.  Psychiatric: Her mood appears anxious.  Well groomed, good eye contact.      ASSESSMENT AND PLAN:   Ms. ZENIYAH PEASTER was seen today for 6 months follow-up.  Orders Placed This Encounter  Procedures  . MR Lumbar Spine Wo Contrast  . Basic metabolic panel  . Hemoglobin A1c  . VITAMIN D 25 Hydroxy (Vit-D Deficiency, Fractures)  . Vitamin B12  . TSH  . CBC  . Ambulatory referral to Neurology  . Ambulatory referral to Dermatology   Lab Results  Component Value Date   HGBA1C 6.0 (H) 07/21/2017   Lab Results  Component Value Date   CREATININE 0.92 07/21/2017   BUN 16 07/21/2017   NA 140 07/21/2017   K 3.9 07/21/2017   CL 108 07/21/2017   CO2 24 07/21/2017    1. DM (diabetes mellitus), type 2 with neurological complications ([place])  [id number] pending today. Continue non pharmacologic treatment,further recommendations according to HgA1C results.  Regular exercise as tolerated and healthy diet with avoidance of added  sugar food intake are important part of treatment and recommended. Annual eye exam, periodic dental and foot care. F/U in 5-6 months  - Hemoglobin A1c  2. CKD (chronic kidney disease), stage III (HCC)  Continue Benazepril. Low salt diet. Adequate HTN and DM II controlled. Avoidance of NSAID's.  - Basic metabolic panel  3. Moderate episode of recurrent major depressive disorder ([place])  Current regimen is not helping, so we agree on decreasing Wellbutrin from 300 mg to 150 mg. She will alternate between 300 mg to 150 mg for 2 weeks then 300 mg q 3rd day and 150 mg rest of days x 2 weeks then 150 mg daily. No changes in Cymbalta. She will resume counseling ,prefers to do so through hospice supporting program. F/U in 2 months.  4. Vitamin D deficiency  Continue Vit D 5000 U daily. Further recommendations will be given according to 25 OH vit D result.  - VITAMIN D 25 Hydroxy (Vit-D Deficiency, Fractures)  5. Peripheral polyneuropathy  No changes in Cymbalta. Has had less falls. Fall precautions.  6. Back pain with radiation  Problem is chronic,getting worse. Lumbar MRI in 2009 was done because back pain and LE /hip pain.No significant degenerative changes.  Further recommendations will be given according to imaging results. She will monitor for worrisome symptoms.  - MR Lumbar Spine Wo Contrast; Future  7. Memory difficulties  New problem. Possible etiologist discussed,including depression.  - Ambulatory referral to Neurology  8. Seborrheic dermatitis  Recommended treatment did not help. Appt with dermatologist will be arranged.  - Ambulatory referral to Dermatology  9. Headache, unspecified headache type  ? Tension headache vs migraine. Hx does not suggest serious process. Continue monitoring for now. Tylenol 500 mg daily prn.  10. Systolic hypertension with cerebrovascular disease  Adequately controlled. No changes in current management. Low salt diet  to continue. Eye exam is current. F/U in 6 months, before if needed.     -Ms. Alexa Brown was advised to return sooner than planned today if new concerns arise.       Veretta G. Martinique, MD  [facility]. [place] office.

## 2017-07-21 ENCOUNTER — Encounter: Payer: Self-pay | Admitting: Family Medicine

## 2017-07-21 ENCOUNTER — Ambulatory Visit (INDEPENDENT_AMBULATORY_CARE_PROVIDER_SITE_OTHER): Payer: Medicare Other | Admitting: Family Medicine

## 2017-07-21 VITALS — BP 130/82 | HR 93 | Temp 98.1°F | Resp 12 | Ht 67.0 in | Wt 159.0 lb

## 2017-07-21 DIAGNOSIS — E1149 Type 2 diabetes mellitus with other diabetic neurological complication: Secondary | ICD-10-CM

## 2017-07-21 DIAGNOSIS — M549 Dorsalgia, unspecified: Secondary | ICD-10-CM | POA: Diagnosis not present

## 2017-07-21 DIAGNOSIS — E559 Vitamin D deficiency, unspecified: Secondary | ICD-10-CM

## 2017-07-21 DIAGNOSIS — L219 Seborrheic dermatitis, unspecified: Secondary | ICD-10-CM | POA: Diagnosis not present

## 2017-07-21 DIAGNOSIS — R51 Headache: Secondary | ICD-10-CM

## 2017-07-21 DIAGNOSIS — I674 Hypertensive encephalopathy: Secondary | ICD-10-CM | POA: Diagnosis not present

## 2017-07-21 DIAGNOSIS — R413 Other amnesia: Secondary | ICD-10-CM

## 2017-07-21 DIAGNOSIS — N183 Chronic kidney disease, stage 3 unspecified: Secondary | ICD-10-CM

## 2017-07-21 DIAGNOSIS — G629 Polyneuropathy, unspecified: Secondary | ICD-10-CM

## 2017-07-21 DIAGNOSIS — R519 Headache, unspecified: Secondary | ICD-10-CM

## 2017-07-21 DIAGNOSIS — F331 Major depressive disorder, recurrent, moderate: Secondary | ICD-10-CM | POA: Diagnosis not present

## 2017-07-21 MED ORDER — BUPROPION HCL ER (XL) 150 MG PO TB24: 150.0000 mg | ORAL_TABLET | Freq: Every day | ORAL | 0 refills | Status: DC

## 2017-07-21 NOTE — Patient Instructions (Addendum)
A few things to remember from today's visit:   CKD (chronic kidney disease), stage III (HCC)  Moderate episode of recurrent major depressive disorder (HCC)  Vitamin D deficiency - Plan: VITAMIN D 25 Hydroxy (Vit-D Deficiency, Fractures)  Peripheral polyneuropathy  Back pain with radiation  DM (diabetes mellitus), type 2 with neurological complications ([place]) - Plan: Basic metabolic panel, Hemoglobin A1c, Microalbumin / creatinine urine ratio  Memory difficulties - Plan: Ambulatory referral to Neurology  Wellbutrin decreased to 150 mg, alternate with 300 mg as instructed.   Please be sure medication list is accurate. If a new problem present, please set up appointment sooner than planned today.

## 2017-07-22 ENCOUNTER — Encounter: Payer: Self-pay | Admitting: Family Medicine

## 2017-07-22 LAB — HEMOGLOBIN A1C
Hgb A1c MFr Bld: 6 % of total Hgb — ABNORMAL HIGH (ref <5.7)
Mean Plasma Glucose: 126 (calc)
eAG (mmol/L): 7 (calc)

## 2017-07-22 LAB — BASIC METABOLIC PANEL
BUN: 16 mg/dL (ref 7–25)
CALCIUM: 9.7 mg/dL (ref 8.6–10.4)
CHLORIDE: 108 mmol/L (ref 98–110)
CO2: 24 mmol/L (ref 20–32)
Creat: 0.92 mg/dL (ref 0.60–0.93)
Glucose, Bld: 114 mg/dL — ABNORMAL HIGH (ref 65–99)
POTASSIUM: 3.9 mmol/L (ref 3.5–5.3)
SODIUM: 140 mmol/L (ref 135–146)

## 2017-07-22 LAB — VITAMIN D 25 HYDROXY (VIT D DEFICIENCY, FRACTURES): Vit D, 25-Hydroxy: 67 ng/mL (ref 30–100)

## 2017-07-23 ENCOUNTER — Other Ambulatory Visit: Payer: Self-pay | Admitting: Family Medicine

## 2017-07-24 LAB — MICROALBUMIN / CREATININE URINE RATIO
CREATININE, U: 113 mg/dL
Microalb Creat Ratio: 0.6 mg/g (ref 0.0–30.0)

## 2017-07-24 NOTE — Addendum Note (Signed)
Addended by: Tomi Likens on: 07/24/2017 12:21 PM   Modules accepted: Orders

## 2017-07-25 ENCOUNTER — Encounter: Payer: Self-pay | Admitting: Family Medicine

## 2017-07-25 ENCOUNTER — Other Ambulatory Visit: Payer: Self-pay | Admitting: *Deleted

## 2017-07-25 DIAGNOSIS — E1149 Type 2 diabetes mellitus with other diabetic neurological complication: Secondary | ICD-10-CM

## 2017-07-25 MED ORDER — ACCU-CHEK SOFTCLIX LANCETS MISC: 2 refills | Status: DC

## 2017-07-26 ENCOUNTER — Encounter: Payer: Self-pay | Admitting: Family Medicine

## 2017-08-05 ENCOUNTER — Other Ambulatory Visit: Payer: Self-pay | Admitting: Family Medicine

## 2017-08-09 ENCOUNTER — Telehealth: Payer: Self-pay | Admitting: Family Medicine

## 2017-08-09 NOTE — Telephone Encounter (Signed)
Tried calling patient, phone rings then goes to message saying that the number you tried to reach is no longer in service. If patient calls back, number needs to be confirmed because [facility] stated in their referral note that they could not reach patient to schedule appointment.

## 2017-08-09 NOTE — Telephone Encounter (Signed)
Copied from [place] [id number]. Topic: Referral - Question >> Aug 09, 2017  1:10 PM Wynetta Emery, [place] C wrote: Reason for CRM: pt  called in to check the status of her referrals.. Pt would like to know if they are complete. Pt states that she has not received a call to schedule an apt  from either location

## 2017-08-11 NOTE — Telephone Encounter (Signed)
Tried calling patient again, still getting the same message that you have reached a number that is no longer in service.

## 2017-08-14 NOTE — Telephone Encounter (Signed)
Tried calling patient, busy signal, will try calling again at a later date.

## 2017-08-15 NOTE — Telephone Encounter (Signed)
Tried calling patient again, phones rings a few times and then goes to a message that stated the number you are trying to reach is no longer in service. Have tried to call patient several times over the last few days with either getting a busy signal or the message saying the number is no longer in service. When patient call the office or come in for an appointment, telephone number will need updating.

## 2017-08-16 ENCOUNTER — Encounter: Payer: Self-pay | Admitting: Neurology

## 2017-08-16 NOTE — Telephone Encounter (Signed)
Pt called [facility] again about referrals. They noted pt had wrong phone number in chart. This has been updated. Referrals have been notated and update sent to specialty office.

## 2017-08-17 ENCOUNTER — Ambulatory Visit
Admission: RE | Admit: 2017-08-17 | Discharge: 2017-08-17 | Disposition: A | Discharge status: Home / Self Care | Payer: Medicare Other | Source: Ambulatory Visit | Attending: Family Medicine | Admitting: Family Medicine

## 2017-08-17 DIAGNOSIS — M545 Low back pain: Secondary | ICD-10-CM | POA: Diagnosis not present

## 2017-08-17 DIAGNOSIS — M549 Dorsalgia, unspecified: Secondary | ICD-10-CM

## 2017-08-22 ENCOUNTER — Encounter: Payer: Self-pay | Admitting: Family Medicine

## 2017-09-02 ENCOUNTER — Other Ambulatory Visit: Payer: Self-pay | Admitting: Family Medicine

## 2017-09-22 ENCOUNTER — Encounter: Payer: Self-pay | Admitting: Family Medicine

## 2017-09-22 ENCOUNTER — Ambulatory Visit (INDEPENDENT_AMBULATORY_CARE_PROVIDER_SITE_OTHER): Payer: Medicare Other | Admitting: Family Medicine

## 2017-09-22 VITALS — BP 124/76 | HR 85 | Temp 98.5°F | Resp 16 | Ht 67.0 in | Wt 148.0 lb

## 2017-09-22 DIAGNOSIS — R2681 Unsteadiness on feet: Secondary | ICD-10-CM

## 2017-09-22 DIAGNOSIS — F331 Major depressive disorder, recurrent, moderate: Secondary | ICD-10-CM

## 2017-09-22 DIAGNOSIS — R519 Headache, unspecified: Secondary | ICD-10-CM

## 2017-09-22 DIAGNOSIS — M5442 Lumbago with sciatica, left side: Secondary | ICD-10-CM | POA: Diagnosis not present

## 2017-09-22 DIAGNOSIS — M5441 Lumbago with sciatica, right side: Secondary | ICD-10-CM

## 2017-09-22 DIAGNOSIS — L821 Other seborrheic keratosis: Secondary | ICD-10-CM

## 2017-09-22 DIAGNOSIS — R51 Headache: Secondary | ICD-10-CM | POA: Diagnosis not present

## 2017-09-22 DIAGNOSIS — M545 Low back pain, unspecified: Secondary | ICD-10-CM | POA: Insufficient documentation

## 2017-09-22 MED ORDER — BUPROPION HCL ER (SR) 100 MG PO TB12: 100.0000 mg | ORAL_TABLET | Freq: Every day | ORAL | 3 refills | Status: DC

## 2017-09-22 NOTE — Assessment & Plan Note (Signed)
Reassured. Educated about disease and prognosis. Currently she is asymptomatic, so no further treatment recommended. We will continue monitoring for changes.

## 2017-09-22 NOTE — Patient Instructions (Addendum)
A few things to remember from today's visit:   Moderate episode of recurrent major depressive disorder (Alexa Brown) - Plan: buPROPion (WELLBUTRIN SR) 100 MG 12 hr tablet  Bilateral low back pain with bilateral sciatica, unspecified chronicity - Plan: Ambulatory referral to Neurosurgery  Headache, unspecified headache type  Unstable gait   Please be sure medication list is accurate. If a new problem present, please set up appointment sooner than planned today.

## 2017-09-22 NOTE — Progress Notes (Signed)
HPI:   Alexa Brown is a 77 y.o. female, who is here today for 2 months follow up.  Her daughter, who was here during last visit could not make it today.   She was last seen on 07/21/17.   Last visit she had several concerns, including: memory issues, headache, back pain, fatigue.  Headache is "once in a while", Tylenol usually helps. Fatigue is "okay", it is usually in the afternoon.  Around 8 PM she is tired and ready to go to bed but she prefers to do so later.Sleeps about 8 hours.  Dizziness has improved, she has had some "slight" episodes. She has not had any falls since her last visit.   Back pain:  Problem is going on for about 6 months. Lower back pain radiated to both lower extremities. Intermittent numbness on lateral aspect of thighs. Pain is exacerbated by prolonged standing on activities like making her bed. It is alleviated by rest. No changes in urine or bowel continence. Negative for saddle anesthesia.  She is not interested in any surgical procedure.  Lumbar MRI was done on 08/17/2017:  1. No spinal canal stenosis or cauda equina compression. 2. Mild lateral recess narrowing at L3-L4, new from the prior study, but no spinal canal or neural foraminal stenosis. 3. Anterior compression deformity of T11 is age indeterminate unlikely to be acute given the lack of marrow edema.   She was referred to neurology, she has an appointment with Dr. Delice Lesch 10/30/2017.  Last visit we decreased Wellbutrin from 300 mg 150 mg because she did not feel like medication was helping with depression. She is still on Cymbalta 60 mg daily, which also helps with peripheral neuropathy symptoms. She also arranged counseling through hospice supportive program, once per week and it is helping.  Denies suicidal thoughts. She is still living with her daughter.   She is now exercising regularly, water exercises 3 times per week for 1.5 hours.      Today she is  concerned about skin "spots", mainly on lower extremities. No pain, easy bleeding, or pruritus. She has had this problem for years, it seems to be getting worse. She has not tried OTC medications.    Review of Systems  Constitutional: Positive for fatigue. Negative for activity change, appetite change and fever.  HENT: Negative for mouth sores, nosebleeds and trouble swallowing.   Eyes: Negative for redness and visual disturbance.  Respiratory: Negative for cough, shortness of breath and wheezing.   Cardiovascular: Negative for chest pain, palpitations and leg swelling.  Gastrointestinal: Negative for abdominal pain, nausea and vomiting.       Negative for changes in bowel habits.  Genitourinary: Negative for decreased urine volume, dysuria and hematuria.  Musculoskeletal: Positive for arthralgias, back pain and gait problem.  Skin: Positive for rash. Negative for color change and wound.  Allergic/Immunologic: Positive for environmental allergies.  Neurological: Negative for syncope and weakness.  Psychiatric/Behavioral: Negative for confusion, sleep disturbance and suicidal ideas. The patient is nervous/anxious.     Current Outpatient Medications on File Prior to Visit  Medication Sig Dispense Refill  . allopurinol (ZYLOPRIM) 100 MG tablet TAKE ONE TABLET BY MOUTH THREE TIMES A DAY 270 tablet 2  . atorvastatin (LIPITOR) 40 MG tablet TAKE ONE TABLET BY MOUTH DAILY 90 tablet 0  . benazepril (LOTENSIN) 20 MG tablet TAKE ONE TABLET BY MOUTH DAILY 90 tablet 0  . calcium carbonate (OSCAL) 1500 (600 Ca) MG TABS tablet Take by mouth  2 (two) times daily with a meal.    . Cholecalciferol (VITAMIN D3) 5000 units CAPS Take 1 capsule by mouth daily.    . clopidogrel (PLAVIX) 75 MG tablet TAKE ONE TABLET BY MOUTH DAILY 90 tablet 3  . DULoxetine (CYMBALTA) 60 MG capsule TAKE ONE CAPSULE BY MOUTH DAILY 90 capsule 2  . L-Lysine 500 MG CAPS Take 1 capsule by mouth daily.    Marland Kitchen latanoprost (XALATAN)  0.005 % ophthalmic solution Place 1 drop into both eyes at bedtime.    . Multiple Vitamins-Minerals (MULTIVITAMIN ADULT PO) Take by mouth daily.    . pantoprazole (PROTONIX) 40 MG tablet TAKE ONE TABLET BY MOUTH DAILY 90 tablet 2  . PRESCRIPTION MEDICATION Take 1 capsule by mouth 2 (two) times daily. New remedy vitamin mixture for neuropathy    . ranitidine (ZANTAC) 300 MG tablet TAKE ONE TABLET BY MOUTH AT BEDTIME 90 tablet 1  . triamcinolone (KENALOG) 0.025 % cream Apply 1 application topically 2 (two) times daily as needed. 30 g 1  . VITAMIN E PO Take by mouth daily.    Marland Kitchen ACCU-CHEK AVIVA PLUS test strip USE TO TEST BLOOD SUGAR ONCE DAILY (Patient not taking: Reported on 09/22/2017) 100 each 3  . ACCU-CHEK SOFTCLIX LANCETS lancets Use to check blood sugar once daily (Patient not taking: Reported on 09/22/2017) 100 each 2  . Lancet Devices (ACCU-CHEK SOFTCLIX) lancets Use to test blood sugar once a day. (Patient not taking: Reported on 09/22/2017) 100 each 2   No current facility-administered medications on file prior to visit.      Past Medical History:  Diagnosis Date  . Chicken pox   . Depression   . Diabetes mellitus without complication ([place])   . GERD (gastroesophageal reflux disease)   . Hyperlipidemia   . Hypertension   . Stroke ([place])   . UTI (urinary tract infection)    No Known Allergies  Social History   Socioeconomic History  . Marital status: Married    Spouse name: Not on file  . Number of children: Not on file  . Years of education: Not on file  . Highest education level: Not on file  Occupational History  . Occupation: N/A  Social Needs  . Financial resource strain: Not on file  . Food insecurity:    Worry: Not on file    Inability: Not on file  . Transportation needs:    Medical: Not on file    Non-medical: Not on file  Tobacco Use  . Smoking status: Never Smoker  . Smokeless tobacco: Never Used  Substance and Sexual Activity  . Alcohol use: Yes     Comment: occassional wine  . Drug use: No  . Sexual activity: Not Currently  Lifestyle  . Physical activity:    Days per week: Not on file    Minutes per session: Not on file  . Stress: Not on file  Relationships  . Social connections:    Talks on phone: Not on file    Gets together: Not on file    Attends religious service: Not on file    Active member of club or organization: Not on file    Attends meetings of clubs or organizations: Not on file    Relationship status: Not on file  Other Topics Concern  . Not on file  Social History Narrative  . Not on file    Vitals:   09/22/17 1355  BP: 124/76  Pulse: 85  Resp: 16  Temp:  98.5 F (36.9 C)  SpO2: 96%   Body mass index is 23.18 kg/m.   Physical Exam  Nursing note and vitals reviewed. Constitutional: She is oriented to person, place, and time. She appears well-developed and well-nourished. No distress.  HENT:  Head: Normocephalic and atraumatic.  Mouth/Throat: Oropharynx is clear and moist and mucous membranes are normal.  Eyes: Pupils are equal, round, and reactive to light. Conjunctivae are normal.  Cardiovascular: Normal rate and regular rhythm.  No murmur heard. Pulses:      Dorsalis pedis pulses are 2+ on the right side, and 2+ on the left side.  Respiratory: Effort normal and breath sounds normal. No respiratory distress.  GI: Soft. She exhibits no mass. There is no hepatomegaly. There is no tenderness.  Musculoskeletal: She exhibits no edema.  Lymphadenopathy:    She has no cervical adenopathy.  Neurological: She is alert and oriented to person, place, and time. She has normal strength.  Skin: Skin is warm. No erythema.  Hyperpigmented macular and some slightly raised, rough lesions scattered on lower extremities and upper extremities. No erythema or tenderness. No suspicious lesions appreciated.  Psychiatric: She has a normal mood and affect.  Well groomed, good eye contact.      ASSESSMENT AND  PLAN:   Alexa Brown was seen today for 2 months follow-up.  Orders Placed This Encounter  Procedures  . Ambulatory referral to Neurosurgery    Unstable gait She has not had a fall since her last visit. Continue assistance with cane. Some medications may increase her risk for falls. Fall precautions discussed.   Depression, major, recurrent ([place]) She reports problem as stable but I think it has improved.  We will continue weaning off Wellbutrin, which was changed from XL 150 mg to SR 100 mg to take once daily. Relaxation exercises, meditation, and yoga may help. She will continue counseling through hospice, which seems to be helping. Follow-up in 4 months.  Seborrheic keratosis Reassured. Educated about disease and prognosis. Currently she is asymptomatic, so no further treatment recommended. We will continue monitoring for changes.  Lower back pain We discussed lumbar MRI findings. She is not interested in surgical procedure but she is open to other treatment alternatives. She would like to see neurosurgeon. Recommend avoiding trigger factors for now.    Headache, unspecified headache type  Improved. She can continue OTC Tylenol 500 mg daily as needed. Follow-up as needed.       Ambyr G. Martinique, MD  [facility]. [place] office.

## 2017-09-22 NOTE — Assessment & Plan Note (Addendum)
She has not had a fall since her last visit. Continue assistance with cane. Some medications may increase her risk for falls. Fall precautions discussed.

## 2017-09-22 NOTE — Assessment & Plan Note (Addendum)
She reports problem as stable but I think it has improved.  We will continue weaning off Wellbutrin, which was changed from XL 150 mg to SR 100 mg to take once daily. Relaxation exercises, meditation, and yoga may help. She will continue counseling through hospice, which seems to be helping. Follow-up in 4 months.

## 2017-09-22 NOTE — Assessment & Plan Note (Signed)
We discussed lumbar MRI findings. She is not interested in surgical procedure but she is open to other treatment alternatives. She would like to see neurosurgeon. Recommend avoiding trigger factors for now.

## 2017-09-25 NOTE — Progress Notes (Signed)
Subjective:   CAMIRA GEIDEL is a 77 y.o. female who presents for Medicare Annual (Subsequent) preventive examination.  Reports health as fair Seen Dr. Martinique 6/28  States she does not have much balance Couldn't find her cane this am No falls  Going on for approx 2 years Was in [place] until her spouse was sick   Spouse was fighting pancreatic cancer July of last year  dtr stays with her in her home  Had a class at hospice  dtr got her into water aerobics  Friends have tried to get her involved  Continues to have grief and was teary eyed today   Diet BMI 23.4  Checks bs 125 to 130  A1c 6.0 Eats a keto diet  States she is pre-diabetic  Discussed diabetes and risk; stress is also a risk     Exercise Had PT about a year ago and did not help with balance Had her doing various exercises  When she can, she is going to water aerobics  Hasn't been for the last 2 weeks; working at home     Health Maintenance Due  Topic Date Due  . OPHTHALMOLOGY EXAM  05/02/2017   [facility] exam with [facility] q 6 months.   Colonoscopy 04/2007  Waived discussion due to overall grief - not appearing in Overdue screen so may have aged out  No intervention was required with the first colonoscopy   Mammogram (left) 11/17/2016 - asked if she needed mammograms. Informed medicare does pay for mammogram every year and would recommend she continue to have these or discuss with the radiologist    Dexa 10/2016 -0.5 - no fracture hx    Cardiac Risk Factors include: advanced age (>53men, >33 women);dyslipidemia;hypertension     Objective:     Vitals: BP (!) 154/80   Pulse 78   Ht 5\' 6"  (1.676 m)   Wt 145 lb (65.8 kg)   SpO2 95%   BMI 23.40 kg/m   Body mass index is 23.4 kg/m.  Advanced Directives 09/26/2017 09/22/2016 01/25/2016 12/23/2015  Does Patient Have a Medical Advance Directive? No No No No  Would patient like information on creating a medical advance directive? - Yes  (MAU/Ambulatory/Procedural Areas - Information given) No - patient declined information No - patient declined information    Tobacco Social History   Tobacco Use  Smoking Status Never Smoker  Smokeless Tobacco Never Used     Counseling given: Yes   Clinical Intake:   Past Medical History:  Diagnosis Date  . Chicken pox   . Depression   . Diabetes mellitus without complication ([place])   . GERD (gastroesophageal reflux disease)   . Hyperlipidemia   . Hypertension   . Stroke ([place])   . UTI (urinary tract infection)    Past Surgical History:  Procedure Laterality Date  . ABDOMINAL HYSTERECTOMY    . APPENDECTOMY    . BREAST BIOPSY Left 2014  . BREAST SURGERY     biopsy   Family History  Problem Relation Age of Onset  . Hyperlipidemia Mother   . Heart disease Mother   . Stroke Mother   . Hypertension Mother   . Hyperlipidemia Father   . Heart disease Father   . Stroke Father   . Hypertension Father   . Asthma Father   . Parkinson's disease Sister    Social History   Socioeconomic History  . Marital status: Married    Spouse name: Not on file  .  Number of children: Not on file  . Years of education: Not on file  . Highest education level: Not on file  Occupational History  . Occupation: N/A  Social Needs  . Financial resource strain: Not on file  . Food insecurity:    Worry: Not on file    Inability: Not on file  . Transportation needs:    Medical: Not on file    Non-medical: Not on file  Tobacco Use  . Smoking status: Never Smoker  . Smokeless tobacco: Never Used  Substance and Sexual Activity  . Alcohol use: Yes    Comment: occassional wine  . Drug use: No  . Sexual activity: Not Currently  Lifestyle  . Physical activity:    Days per week: Not on file    Minutes per session: Not on file  . Stress: Not on file  Relationships  . Social connections:    Talks on phone: Not on file    Gets together: Not on file    Attends religious service: Not on  file    Active member of club or organization: Not on file    Attends meetings of clubs or organizations: Not on file    Relationship status: Not on file  Other Topics Concern  . Not on file  Social History Narrative  . Not on file    Outpatient Encounter Medications as of 09/26/2017  Medication Sig  . ACCU-CHEK AVIVA PLUS test strip USE TO TEST BLOOD SUGAR ONCE DAILY  . ACCU-CHEK SOFTCLIX LANCETS lancets Use to check blood sugar once daily  . allopurinol (ZYLOPRIM) 100 MG tablet TAKE ONE TABLET BY MOUTH THREE TIMES A DAY  . atorvastatin (LIPITOR) 40 MG tablet TAKE ONE TABLET BY MOUTH DAILY  . benazepril (LOTENSIN) 20 MG tablet TAKE ONE TABLET BY MOUTH DAILY  . buPROPion (WELLBUTRIN SR) 100 MG 12 hr tablet Take 1 tablet (100 mg total) by mouth daily.  . calcium carbonate (OSCAL) 1500 (600 Ca) MG TABS tablet Take by mouth 2 (two) times daily with a meal.  . Cholecalciferol (VITAMIN D3) 5000 units CAPS Take 1 capsule by mouth daily.  . clopidogrel (PLAVIX) 75 MG tablet TAKE ONE TABLET BY MOUTH DAILY  . DULoxetine (CYMBALTA) 60 MG capsule TAKE ONE CAPSULE BY MOUTH DAILY  . L-Lysine 500 MG CAPS Take 1 capsule by mouth daily.  [employer] (ACCU-CHEK SOFTCLIX) lancets Use to test blood sugar once a day.  . latanoprost (XALATAN) 0.005 % ophthalmic solution Place 1 drop into both eyes at bedtime.  Marland Kitchen MELATONIN ER PO Take by mouth.  . Multiple Vitamins-Minerals (MULTIVITAMIN ADULT PO) Take by mouth daily.  . Omega-3 Fatty Acids (FISH OIL) 1000 MG CAPS Take by mouth.  . pantoprazole (PROTONIX) 40 MG tablet TAKE ONE TABLET BY MOUTH DAILY  . PRESCRIPTION MEDICATION Take 1 capsule by mouth 2 (two) times daily. New remedy vitamin mixture for neuropathy  . ranitidine (ZANTAC) 300 MG tablet TAKE ONE TABLET BY MOUTH AT BEDTIME  . triamcinolone (KENALOG) 0.025 % cream Apply 1 application topically 2 (two) times daily as needed.  Marland Kitchen VITAMIN E PO Take by mouth daily.   No facility-administered  encounter medications on file as of 09/26/2017.     Activities of Daily Living In your present state of health, do you have any difficulty performing the following activities: 09/26/2017  Hearing? Y  Comment issues in a crowded room   Vision? Y  Difficulty concentrating or making decisions? (No Data)  Comment some  memory issues   Walking or climbing stairs? Y  Comment balance is not good  Dressing or bathing? N  Doing errands, shopping? N  Preparing Food and eating ? N  Using the Toilet? N  In the past six months, have you accidently leaked urine? N  Do you have problems with loss of bowel control? N  Managing your Medications? N  Managing your Finances? N  Housekeeping or managing your Housekeeping? N  Some recent data might be hidden    Patient Care Team: Martinique, Linn G, MD as PCP - General (Family Medicine)    Assessment:   This is a routine wellness examination for [place].  Exercise Activities and Dietary recommendations Current Exercise Habits: Home exercise routine, Type of exercise: walking;strength training/weights, Time (Minutes): 60, Frequency (Times/Week): (S) 2(pool ), Weekly Exercise (Minutes/Week): 120, Intensity: Moderate  Goals    . Patient Stated     Will get some counseling to explore your journey now without your spouse        Fall Risk Fall Risk  09/26/2017 09/26/2017 09/22/2016  Falls in the past year? No No Yes  Number falls in past yr: - - 2 or more  Injury with Fall? - - No  Risk for fall due to : - - History of fall(s);Impaired balance/gait  Follow up - - Education provided;Falls prevention discussed   She is somewhat at risk but does walk slow and is careful. Had PT in the past. Dtr lives with her and she is going to water aerobics    Depression Screen PHQ 2/9 Scores 09/26/2017 09/22/2016  PHQ - 2 Score 3 6  PHQ- 9 Score 12 17    Continues to test positive. Has some unresolved grief over feelings of inadequacy when caring for her dying spouse.  Agreed that counseling may help and agreed to make an apt with Dr. Glennon Hamilton today. Verbalized no issues with self referral .  Was told that if she can not reach Dr. Glennon Hamilton for an apt soon, to make an apt with Dr. Martinique for fup   Cognitive Function MMSE - Mini Mental State Exam 09/26/2017  Not completed: (No Data)   Ad8 score reviewed for issues:  Issues making decisions:  Less interest in hobbies / activities:  Repeats questions, stories (family complaining):  Trouble using ordinary gadgets (microwave, computer, phone):  Forgets the month or year:   Mismanaging finances:   Remembering appts:  Daily problems with thinking and/or memory: Ad8 score is=0 Did not evaluate for memory loss due to grief.  Will evaluate if she has complaints but today, she is a good historian           Immunization History  Administered Date(s) Administered  . Influenza Whole 02/01/2016  . Influenza, High Dose Seasonal PF 01/14/2016  . Influenza-Unspecified 01/12/2017  . Tdap 12/24/2015  . Zoster Recombinat (Shingrix) 08/16/2016, 01/12/2017     Screening Tests Health Maintenance  Topic Date Due  . OPHTHALMOLOGY EXAM  05/02/2017  . COLONOSCOPY  09/23/2018 (Originally 05/06/2017)  . INFLUENZA VACCINE  10/26/2017  . HEMOGLOBIN A1C  01/20/2018  . FOOT EXAM  09/23/2018  . TETANUS/TDAP  12/23/2025  . DEXA SCAN  Completed  . PNA vac Low Risk Adult  Discontinued        Plan:      PCP Notes   Health Maintenance [facility] exam with A [facility] physician q 6 months.   Colonoscopy 04/2007  Waived discussion due to overall grief - not appearing in Overdue  screen so may have aged out  No intervention was required with the first colonoscopy   Mammogram (left) 11/17/2016 - asked if she needed mammograms. Informed medicare does pay for mammogram every year and would recommend she continue to have these or discuss with the radiologist    Dexa 10/2016 -0.5 - no fracture hx    Abnormal Screens  Phq 12    Continues to test positive and teary today  Has some unresolved grief over feelings of inadequacy when caring for her dying spouse. Agreed that counseling may help and agreed to make an apt with Dr. Glennon Hamilton today. Verbalized no issues with self referral .  Was told that if she can not reach Dr. Glennon Hamilton for an apt soon, to make an apt with Dr. Martinique for fup     Referrals  The patient will self refer to Dr. Glennon Hamilton  Patient concerns; As noted   Nurse Concerns; As noted   Next PCP apt Was just seen 09/22/2017    I have personally reviewed and noted the following in the patient's chart:   . Medical and social history . Use of alcohol, tobacco or illicit drugs  . Current medications and supplements . Functional ability and status . Nutritional status . Physical activity . Advanced directives . List of other physicians . Hospitalizations, surgeries, and ER visits in previous 12 months . Vitals . Screenings to include cognitive, depression, and falls . Referrals and appointments  In addition, I have reviewed and discussed with patient certain preventive protocols, quality metrics, and best practice recommendations. A written personalized care plan for preventive services as well as general preventive health recommendations were provided to patient.     Wynetta Fines, RN  09/26/2017

## 2017-09-26 ENCOUNTER — Ambulatory Visit (INDEPENDENT_AMBULATORY_CARE_PROVIDER_SITE_OTHER): Payer: Medicare Other

## 2017-09-26 VITALS — BP 154/80 | HR 78 | Ht 66.0 in | Wt 145.0 lb

## 2017-09-26 DIAGNOSIS — Z0000 Encounter for general adult medical examination without abnormal findings: Secondary | ICD-10-CM | POA: Diagnosis not present

## 2017-09-26 NOTE — Progress Notes (Signed)
I have reviewed documentation from this visit and I agree with recommendations given.  Adabella G. Menachem Urbanek, MD  [facility]. [place] office.   

## 2017-09-26 NOTE — Patient Instructions (Addendum)
Ms. Shelton , Thank you for taking time to come for your Medicare Wellness Visit. I appreciate your ongoing commitment to your health goals. Please review the following plan we discussed and let me know if I can assist you in the future.   If you have any issues making a fast apt with Dr. Glennon Hamilton, please make an apt with Dr. Martinique   May want to have your hearing checked next year  [facility] - can assist with hearing aid x 1  No reviews  [facility]  [address]  [phone] [place]   [email or web address]   These are the goals we discussed: Goals    . Patient Stated     Will get some counseling to explore your journey now without your spouse        This is a list of the screening recommended for you and due dates:  Health Maintenance  Topic Date Due  . Eye exam for diabetics  05/02/2017  . Colon Cancer Screening  09/23/2018*  . Flu Shot  10/26/2017  . Hemoglobin A1C  01/20/2018  . Complete foot exam   09/23/2018  . Tetanus Vaccine  12/23/2025  . DEXA scan (bone density measurement)  Completed  . Pneumonia vaccines  Discontinued  *Topic was postponed. The date shown is not the original due date.      [facility] - can assist with hearing aid x 1  No reviews  [employer] [facility]  [address]  [phone]  [email or web address]_to_[email or web address]_[email or web address]   Fall Prevention in the Home Falls can cause injuries. They can happen to people of all ages. There are many things you can do to make your home safe and to help prevent falls. What can I do on the outside of my home?  Regularly fix the edges of walkways and driveways and fix any cracks.  Remove anything that might make you trip as you walk through a door,  such as a raised step or threshold.  Trim any bushes or trees on the path to your home.  Use bright outdoor lighting.  Clear any walking paths of anything that might make someone trip, such as rocks or tools.  Regularly check to see if handrails are loose or broken. Make sure that both sides of any steps have handrails.  Any raised decks and porches should have guardrails on the edges.  Have any leaves, snow, or ice cleared regularly.  Use sand or salt on walking paths during winter.  Clean up any spills in your garage right away. This includes oil or grease spills. What can I do in the bathroom?  Use night lights.  Install grab bars by the toilet and in the tub and shower. Do not use towel bars as grab bars.  Use non-skid mats or decals in the tub or shower.  If you need to sit down in the shower, use a plastic, non-slip stool.  Keep the floor dry. Clean up any water that spills on the floor as soon as it happens.  Remove soap buildup in the tub or shower regularly.  Attach bath mats securely with double-sided non-slip rug tape.  Do not have throw rugs and other things on the floor that can make you trip. What can I do in the bedroom?  Use night lights.  Make sure that you have a light by your bed that is easy  to reach.  Do not use any sheets or blankets that are too big for your bed. They should not hang down onto the floor.  Have a firm chair that has side arms. You can use this for support while you get dressed.  Do not have throw rugs and other things on the floor that can make you trip. What can I do in the kitchen?  Clean up any spills right away.  Avoid walking on wet floors.  Keep items that you use a lot in easy-to-reach places.  If you need to reach something above you, use a strong step stool that has a grab bar.  Keep electrical cords out of the way.  Do not use floor polish or wax that makes floors slippery. If you must use wax, use non-skid  floor wax.  Do not have throw rugs and other things on the floor that can make you trip. What can I do with my stairs?  Do not leave any items on the stairs.  Make sure that there are handrails on both sides of the stairs and use them. Fix handrails that are broken or loose. Make sure that handrails are as long as the stairways.  Check any carpeting to make sure that it is firmly attached to the stairs. Fix any carpet that is loose or worn.  Avoid having throw rugs at the top or bottom of the stairs. If you do have throw rugs, attach them to the floor with carpet tape.  Make sure that you have a light switch at the top of the stairs and the bottom of the stairs. If you do not have them, ask someone to add them for you. What else can I do to help prevent falls?  Wear shoes that: ? Do not have high heels. ? Have rubber bottoms. ? Are comfortable and fit you well. ? Are closed at the toe. Do not wear sandals.  If you use a stepladder: ? Make sure that it is fully opened. Do not climb a closed stepladder. ? Make sure that both sides of the stepladder are locked into place. ? Ask someone to hold it for you, if possible.  Clearly mark and make sure that you can see: ? Any grab bars or handrails. ? First and last steps. ? Where the edge of each step is.  Use tools that help you move around (mobility aids) if they are needed. These include: ? Canes. ? Walkers. ? Scooters. ? Crutches.  Turn on the lights when you go into a dark area. Replace any light bulbs as soon as they burn out.  Set up your furniture so you have a clear path. Avoid moving your furniture around.  If any of your floors are uneven, fix them.  If there are any pets around you, be aware of where they are.  Review your medicines with your doctor. Some medicines can make you feel dizzy. This can increase your chance of falling. Ask your doctor what other things that you can do to help prevent falls. This  information is not intended to replace advice given to you by your health care provider. Make sure you discuss any questions you have with your health care provider. Document Released: 01/08/2009 Document Revised: 08/20/2015 Document Reviewed: 04/18/2014 Elsevier Interactive Patient Education  2018 [place] Maintenance, Female Adopting a healthy lifestyle and getting preventive care can go a long way to promote health and wellness. Talk with your health care provider about  what schedule of regular examinations is right for you. This is a good chance for you to check in with your provider about disease prevention and staying healthy. In between checkups, there are plenty of things you can do on your own. Experts have done a lot of research about which lifestyle changes and preventive measures are most likely to keep you healthy. Ask your health care provider for more information. Weight and diet Eat a healthy diet  Be sure to include plenty of vegetables, fruits, low-fat dairy products, and lean protein.  Do not eat a lot of foods high in solid fats, added sugars, or salt.  Get regular exercise. This is one of the most important things you can do for your health. ? Most adults should exercise for at least 150 minutes each week. The exercise should increase your heart rate and make you sweat (moderate-intensity exercise). ? Most adults should also do strengthening exercises at least twice a week. This is in addition to the moderate-intensity exercise.  Maintain a healthy weight  Body mass index (BMI) is a measurement that can be used to identify possible weight problems. It estimates body fat based on height and weight. Your health care provider can help determine your BMI and help you achieve or maintain a healthy weight.  For females 61 years of age and older: ? A BMI below 18.5 is considered underweight. ? A BMI of 18.5 to 24.9 is normal. ? A BMI of 25 to 29.9 is considered  overweight. ? A BMI of 30 and above is considered obese.  Watch levels of cholesterol and blood lipids  You should start having your blood tested for lipids and cholesterol at 75 years of age, then have this test every 5 years.  You may need to have your cholesterol levels checked more often if: ? Your lipid or cholesterol levels are high. ? You are older than 75 years of age. ? You are at high risk for heart disease.  Cancer screening Lung Cancer  Lung cancer screening is recommended for adults 63-42 years old who are at high risk for lung cancer because of a history of smoking.  A yearly low-dose CT scan of the lungs is recommended for people who: ? Currently smoke. ? Have quit within the past 15 years. ? Have at least a 30-pack-year history of smoking. A pack year is smoking an average of one pack of cigarettes a day for 1 year.  Yearly screening should continue until it has been 15 years since you quit.  Yearly screening should stop if you develop a health problem that would prevent you from having lung cancer treatment.  Breast Cancer  Practice breast self-awareness. This means understanding how your breasts normally appear and feel.  It also means doing regular breast self-exams. Let your health care provider know about any changes, no matter how small.  If you are in your 20s or 30s, you should have a clinical breast exam (CBE) by a health care provider every 1-3 years as part of a regular health exam.  If you are 69 or older, have a CBE every year. Also consider having a breast X-ray (mammogram) every year.  If you have a family history of breast cancer, talk to your health care provider about genetic screening.  If you are at high risk for breast cancer, talk to your health care provider about having an MRI and a mammogram every year.  Breast cancer gene (BRCA) assessment is recommended for women  who have family members with BRCA-related cancers. BRCA-related cancers  include: ? Breast. ? Ovarian. ? Tubal. ? Peritoneal cancers.  Results of the assessment will determine the need for genetic counseling and BRCA1 and BRCA2 testing.  Cervical Cancer Your health care provider may recommend that you be screened regularly for cancer of the pelvic organs (ovaries, uterus, and vagina). This screening involves a pelvic examination, including checking for microscopic changes to the surface of your cervix (Pap test). You may be encouraged to have this screening done every 3 years, beginning at age 52.  For women ages 66-65, health care providers may recommend pelvic exams and Pap testing every 3 years, or they may recommend the Pap and pelvic exam, combined with testing for human papilloma virus (HPV), every 5 years. Some types of HPV increase your risk of cervical cancer. Testing for HPV may also be done on women of any age with unclear Pap test results.  Other health care providers may not recommend any screening for nonpregnant women who are considered low risk for pelvic cancer and who do not have symptoms. Ask your health care provider if a screening pelvic exam is right for you.  If you have had past treatment for cervical cancer or a condition that could lead to cancer, you need Pap tests and screening for cancer for at least 20 years after your treatment. If Pap tests have been discontinued, your risk factors (such as having a new sexual partner) need to be reassessed to determine if screening should resume. Some women have medical problems that increase the chance of getting cervical cancer. In these cases, your health care provider may recommend more frequent screening and Pap tests.  Colorectal Cancer  This type of cancer can be detected and often prevented.  Routine colorectal cancer screening usually begins at 75 years of age and continues through 75 years of age.  Your health care provider may recommend screening at an earlier age if you have risk factors  for colon cancer.  Your health care provider may also recommend using home test kits to check for hidden blood in the stool.  A small camera at the end of a tube can be used to examine your colon directly (sigmoidoscopy or colonoscopy). This is done to check for the earliest forms of colorectal cancer.  Routine screening usually begins at age 63.  Direct examination of the colon should be repeated every 5-10 years through 75 years of age. However, you may need to be screened more often if early forms of precancerous polyps or small growths are found.  Skin Cancer  Check your skin from head to toe regularly.  Tell your health care provider about any new moles or changes in moles, especially if there is a change in a mole's shape or color.  Also tell your health care provider if you have a mole that is larger than the size of a pencil eraser.  Always use sunscreen. Apply sunscreen liberally and repeatedly throughout the day.  Protect yourself by wearing long sleeves, pants, a wide-brimmed hat, and sunglasses whenever you are outside.  Heart disease, diabetes, and high blood pressure  High blood pressure causes heart disease and increases the risk of stroke. High blood pressure is more likely to develop in: ? People who have blood pressure in the high end of the normal range (130-139/85-89 mm Hg). ? People who are overweight or obese. ? People who are African American.  If you are 27-34 years of age, have  your blood pressure checked every 3-5 years. If you are 60 years of age or older, have your blood pressure checked every year. You should have your blood pressure measured twice-once when you are at a hospital or clinic, and once when you are not at a hospital or clinic. Record the average of the two measurements. To check your blood pressure when you are not at a hospital or clinic, you can use: ? An automated blood pressure machine at a pharmacy. ? A home blood pressure monitor.  If  you are between 80 years and 32 years old, ask your health care provider if you should take aspirin to prevent strokes.  Have regular diabetes screenings. This involves taking a blood sample to check your fasting blood sugar level. ? If you are at a normal weight and have a low risk for diabetes, have this test once every three years after 75 years of age. ? If you are overweight and have a high risk for diabetes, consider being tested at a younger age or more often. Preventing infection Hepatitis B  If you have a higher risk for hepatitis B, you should be screened for this virus. You are considered at high risk for hepatitis B if: ? You were born in a country where hepatitis B is common. Ask your health care provider which countries are considered high risk. ? Your parents were born in a high-risk country, and you have not been immunized against hepatitis B (hepatitis B vaccine). ? You have HIV or AIDS. ? You use needles to inject street drugs. ? You live with someone who has hepatitis B. ? You have had sex with someone who has hepatitis B. ? You get hemodialysis treatment. ? You take certain medicines for conditions, including cancer, organ transplantation, and autoimmune conditions.  Hepatitis C  Blood testing is recommended for: ? Everyone born from [birth year] through 1963. ? Anyone with known risk factors for hepatitis C.  Sexually transmitted infections (STIs)  You should be screened for sexually transmitted infections (STIs) including gonorrhea and chlamydia if: ? You are sexually active and are younger than 75 years of age. ? You are older than 75 years of age and your health care provider tells you that you are at risk for this type of infection. ? Your sexual activity has changed since you were last screened and you are at an increased risk for chlamydia or gonorrhea. Ask your health care provider if you are at risk.  If you do not have HIV, but are at risk, it may be recommended  that you take a prescription medicine daily to prevent HIV infection. This is called pre-exposure prophylaxis (PrEP). You are considered at risk if: ? You are sexually active and do not regularly use condoms or know the HIV status of your partner(s). ? You take drugs by injection. ? You are sexually active with a partner who has HIV.  Talk with your health care provider about whether you are at high risk of being infected with HIV. If you choose to begin PrEP, you should first be tested for HIV. You should then be tested every 3 months for as long as you are taking PrEP. Pregnancy  If you are premenopausal and you may become pregnant, ask your health care provider about preconception counseling.  If you may become pregnant, take 400 to 800 micrograms (mcg) of folic acid every day.  If you want to prevent pregnancy, talk to your health care provider about birth control (  contraception). Osteoporosis and menopause  Osteoporosis is a disease in which the bones lose minerals and strength with aging. This can result in serious bone fractures. Your risk for osteoporosis can be identified using a bone density scan.  If you are 63 years of age or older, or if you are at risk for osteoporosis and fractures, ask your health care provider if you should be screened.  Ask your health care provider whether you should take a calcium or vitamin D supplement to lower your risk for osteoporosis.  Menopause may have certain physical symptoms and risks.  Hormone replacement therapy may reduce some of these symptoms and risks. Talk to your health care provider about whether hormone replacement therapy is right for you. Follow these instructions at home:  Schedule regular health, dental, and eye exams.  Stay current with your immunizations.  Do not use any tobacco products including cigarettes, chewing tobacco, or electronic cigarettes.  If you are pregnant, do not drink alcohol.  If you are  breastfeeding, limit how much and how often you drink alcohol.  Limit alcohol intake to no more than 1 drink per day for nonpregnant women. One drink equals 12 ounces of beer, 5 ounces of wine, or 1 ounces of hard liquor.  Do not use street drugs.  Do not share needles.  Ask your health care provider for help if you need support or information about quitting drugs.  Tell your health care provider if you often feel depressed.  Tell your health care provider if you have ever been abused or do not feel safe at home. This information is not intended to replace advice given to you by your health care provider. Make sure you discuss any questions you have with your health care provider. Document Released: 09/27/2010 Document Revised: 08/20/2015 Document Reviewed: 12/16/2014 Elsevier Interactive Patient Education  [employer].

## 2017-09-29 ENCOUNTER — Telehealth: Payer: Self-pay | Admitting: Family Medicine

## 2017-09-29 NOTE — Telephone Encounter (Signed)
Please advise 

## 2017-09-29 NOTE — Telephone Encounter (Signed)
Spoke with patient and she stated that she saw Alexa Brown on Wednesday and she referred her to Dr. Glennon Hamilton, but she isn't taking new patients. Patient would like for Dr. 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 Please advise.

## 2017-09-29 NOTE — Telephone Encounter (Unsigned)
Copied from [place] [phone]. Topic: Quick Communication - See Telephone Encounter >> Sep 29, 2017 12:02 PM Boyd Kerbs wrote: CRM for notification. See Telephone encounter for: 09/29/17.  Pt would like to speak to Dr. Martinique about her [place] on 7/2  Asking if doctor could call her

## 2017-10-01 ENCOUNTER — Other Ambulatory Visit: Payer: Self-pay | Admitting: Family Medicine

## 2017-10-01 DIAGNOSIS — K219 Gastro-esophageal reflux disease without esophagitis: Secondary | ICD-10-CM

## 2017-10-03 NOTE — Telephone Encounter (Signed)
Spoke with patient and informed her that a flyer with a list of behavorial medicine doctors will be mailed to her home for her to choose from per Dr. Martinique. Patient verbalized understanding.

## 2017-10-03 NOTE — Telephone Encounter (Signed)
We can mail flyer with list of providers at [facility].  Thanks, BJ

## 2017-10-04 ENCOUNTER — Other Ambulatory Visit: Payer: Self-pay | Admitting: Family Medicine

## 2017-10-04 DIAGNOSIS — F3341 Major depressive disorder, recurrent, in partial remission: Secondary | ICD-10-CM

## 2017-10-04 DIAGNOSIS — K219 Gastro-esophageal reflux disease without esophagitis: Secondary | ICD-10-CM

## 2017-10-17 ENCOUNTER — Other Ambulatory Visit: Payer: Self-pay | Admitting: Family Medicine

## 2017-10-20 ENCOUNTER — Other Ambulatory Visit: Payer: Self-pay | Admitting: Family Medicine

## 2017-10-20 DIAGNOSIS — F3341 Major depressive disorder, recurrent, in partial remission: Secondary | ICD-10-CM

## 2017-10-30 ENCOUNTER — Other Ambulatory Visit: Payer: Self-pay

## 2017-10-30 ENCOUNTER — Encounter: Payer: Self-pay | Admitting: Neurology

## 2017-10-30 ENCOUNTER — Ambulatory Visit (INDEPENDENT_AMBULATORY_CARE_PROVIDER_SITE_OTHER): Payer: Medicare Other | Admitting: Neurology

## 2017-10-30 VITALS — BP 170/78 | HR 96 | Ht 66.0 in | Wt 144.0 lb

## 2017-10-30 DIAGNOSIS — R32 Unspecified urinary incontinence: Secondary | ICD-10-CM

## 2017-10-30 DIAGNOSIS — R413 Other amnesia: Secondary | ICD-10-CM

## 2017-10-30 DIAGNOSIS — G629 Polyneuropathy, unspecified: Secondary | ICD-10-CM | POA: Diagnosis not present

## 2017-10-30 DIAGNOSIS — R159 Full incontinence of feces: Secondary | ICD-10-CM | POA: Diagnosis not present

## 2017-10-30 NOTE — Patient Instructions (Addendum)
1. Bloodwork for TSH, B12 2. Schedule MRI brain without contrast  We have sent a referral to [facility] for your MRI and they will call you directly to schedule your appt. They are located at [place]. If you need to contact them directly please call [phone].   3. Proceed with follow-up with psychologist to address mood  4. Discuss bowel/bladder incontinence with Dr. Martinique 5. Follow-up in 6 months, call for any changes  FALL PRECAUTIONS: Be cautious when walking. Scan the area for obstacles that may increase the risk of trips and falls. When getting up in the mornings, sit up at the edge of the bed for a few minutes before getting out of bed. Consider elevating the bed at the head end to avoid drop of blood pressure when getting up. Walk always in a well-lit room (use night lights in the walls). Avoid area rugs or power cords from appliances in the middle of the walkways. Use a walker or a cane if necessary and consider physical therapy for balance exercise. Get your eyesight checked regularly.  FINANCIAL OVERSIGHT: Supervision, especially oversight when making financial decisions or transactions is also recommended.  HOME SAFETY: Consider the safety of the kitchen when operating appliances like stoves, microwave oven, and blender. Consider having supervision and share cooking responsibilities until no longer able to participate in those. Accidents with firearms and other hazards in the house should be identified and addressed as well.  DRIVING: Regarding driving, in patients with progressive memory problems, driving will be impaired. We advise to have someone else do the driving if trouble finding directions or if minor accidents are reported. Independent driving assessment is available to determine safety of driving.  ABILITY TO BE LEFT ALONE: If patient is unable to contact 911 operator, consider using LifeLine, or when the need is there, arrange for someone to stay with  patients. Smoking is a fire hazard, consider supervision or cessation. Risk of wandering should be assessed by caregiver and if detected at any point, supervision and safe proof recommendations should be instituted.  MEDICATION SUPERVISION: Inability to self-administer medication needs to be constantly addressed. Implement a mechanism to ensure safe administration of the medications.  RECOMMENDATIONS FOR ALL PATIENTS WITH MEMORY PROBLEMS: 1. Continue to exercise (Recommend 30 minutes of walking everyday, or 3 hours every week) 2. Increase social interactions - continue going to [place] and enjoy social gatherings with friends and family 3. Eat healthy, avoid fried foods and eat more fruits and vegetables 4. Maintain adequate blood pressure, blood sugar, and blood cholesterol level. Reducing the risk of stroke and cardiovascular disease also helps promoting better memory. 5. Avoid stressful situations. Live a simple life and avoid aggravations. Organize your time and prepare for the next day in anticipation. 6. Sleep well, avoid any interruptions of sleep and avoid any distractions in the bedroom that may interfere with adequate sleep quality 7. Avoid sugar, avoid sweets as there is a strong link between excessive sugar intake, diabetes, and cognitive impairment We discussed the Mediterranean diet, which has been shown to help patients reduce the risk of progressive memory disorders and reduces cardiovascular risk. This includes eating fish, eat fruits and green leafy vegetables, nuts like almonds and hazelnuts, walnuts, and also use olive oil. Avoid fast foods and fried foods as much as possible. Avoid sweets and sugar as sugar use has been linked to worsening of memory function.

## 2017-10-30 NOTE — Progress Notes (Signed)
NEUROLOGY CONSULTATION NOTE  Alexa Brown MRN: [id number] DOB: Jan 05, 1941  Referring provider: Dr. Breaunna Martinique Primary care provider: Dr. Kajsa Martinique  Reason for consult:  Memory loss  Dear Dr Martinique:  Thank you for your kind referral of Alexa Brown for consultation of the above symptoms. Although her history is well known to you, please allow me to reiterate it for the purpose of our medical record. The patient was accompanied to the clinic by her daughter who also provides collateral information. Records and images were personally reviewed where available.  HISTORY OF PRESENT ILLNESS: This is a pleasant 77 year old right-handed woman with a history of hypertension, hyperlipidemia, diet-controlled diabetes, stroke in 1994 with minimal residual deficits, presenting for evaluation of worsening memory. She feels her memory is "okay." Her daughter started noticing memory changes a couple of years ago, more noticeable when her husband was going through cancer then passed away last Nov 07, 2022. She mostly notices word-finding difficulties, forgetting a word in the middle of a sentence. She loses things frequently at home, they have a "Tile on everything."  She continues to drive without getting lost, but would occasionally miss a turn or get a little confused on the road. Her daughter does most of the driving. She continues to manage her bills and medications independently, but would occasionally miss her medications. Her daughter moved in to live with her since her husband passed away. Her daughter has noticed occasional confusion, her daughter would explain something then later on she asks about the same thing. No personality changes, paranoia, or hallucinations. She has good and bad days with depression and will be seeing a psychologist next month. She usually sleeps around 3-4 hours and dreams of her husband every night.   She had a stroke in 1994 affecting her left arm and leg,  she had about 90% recovery. Her daughter notices she would drag her left leg more when tired. Since 11-07-22, she has had bowel and bladder incontinence, bladder incontinence is worse. She denies any perineal numbness. She has low back pain radiating around her trunk. She has occasional dizziness when standing. She was having frequent falls in the past, better on lower dose Wellbutrin. Last fall was a week ago. She has neuropathy in both feet. She denies any headaches, diplopia, dysarthria/dysphagia, neck pain, focal numbness/tingling, anosmia, or tremors. No family history of dementia. She denies any history of significant head injuries and rarely drinks alcohol.   PAST MEDICAL HISTORY: Past Medical History:  Diagnosis Date  . Chicken pox   . Depression   . Diabetes mellitus without complication ([place])   . GERD (gastroesophageal reflux disease)   . Hyperlipidemia   . Hypertension   . Stroke ([place])   . UTI (urinary tract infection)     PAST SURGICAL HISTORY: Past Surgical History:  Procedure Laterality Date  . ABDOMINAL HYSTERECTOMY    . APPENDECTOMY    . BREAST BIOPSY Left 2014  . BREAST SURGERY     biopsy    MEDICATIONS: Current Outpatient Medications on File Prior to Visit  Medication Sig Dispense Refill  . ACCU-CHEK AVIVA PLUS test strip USE TO TEST BLOOD SUGAR ONCE DAILY 100 each 3  . ACCU-CHEK SOFTCLIX LANCETS lancets Use to check blood sugar once daily 100 each 2  . allopurinol (ZYLOPRIM) 100 MG tablet TAKE ONE TABLET BY MOUTH THREE TIMES A DAY 270 tablet 2  . atorvastatin (LIPITOR) 40 MG tablet TAKE ONE TABLET BY MOUTH DAILY 90 tablet 0  .  benazepril (LOTENSIN) 20 MG tablet TAKE ONE TABLET BY MOUTH DAILY 90 tablet 0  . buPROPion (WELLBUTRIN SR) 100 MG 12 hr tablet Take 1 tablet (100 mg total) by mouth daily. 30 tablet 3  . calcium carbonate (OSCAL) 1500 (600 Ca) MG TABS tablet Take by mouth 2 (two) times daily with a meal.    . Cholecalciferol (VITAMIN D3) 5000 units CAPS Take 1  capsule by mouth daily.    . clopidogrel (PLAVIX) 75 MG tablet TAKE ONE TABLET BY MOUTH DAILY 90 tablet 3  . DULoxetine (CYMBALTA) 60 MG capsule TAKE ONE CAPSULE BY MOUTH DAILY 90 capsule 2  . L-Lysine 500 MG CAPS Take 1 capsule by mouth daily.    Elmore Guise Devices (ACCU-CHEK SOFTCLIX) lancets Use to test blood sugar once a day. 100 each 2  . latanoprost (XALATAN) 0.005 % ophthalmic solution Place 1 drop into both eyes at bedtime.    Marland Kitchen MELATONIN ER PO Take by mouth.    . Multiple Vitamins-Minerals (MULTIVITAMIN ADULT PO) Take by mouth daily.    . Omega-3 Fatty Acids (FISH OIL) 1000 MG CAPS Take by mouth.    . pantoprazole (PROTONIX) 40 MG tablet TAKE ONE TABLET BY MOUTH DAILY 90 tablet 1  . PRESCRIPTION MEDICATION Take 1 capsule by mouth 2 (two) times daily. New remedy vitamin mixture for neuropathy    . ranitidine (ZANTAC) 300 MG tablet TAKE ONE TABLET BY MOUTH EVERY NIGHT AT BEDTIME 90 tablet 3  . triamcinolone (KENALOG) 0.025 % cream Apply 1 application topically 2 (two) times daily as needed. 30 g 1  . VITAMIN E PO Take by mouth daily.     No current facility-administered medications on file prior to visit.     ALLERGIES: No Known Allergies  FAMILY HISTORY: Family History  Problem Relation Age of Onset  . Hyperlipidemia Mother   . Heart disease Mother   . Stroke Mother   . Hypertension Mother   . Hyperlipidemia Father   . Heart disease Father   . Stroke Father   . Hypertension Father   . Asthma Father   . Parkinson's disease Sister     SOCIAL HISTORY: Social History   Socioeconomic History  . Marital status: Married    Spouse name: Not on file  . Number of children: Not on file  . Years of education: Not on file  . Highest education level: Not on file  Occupational History  . Occupation: N/A  Social Needs  . Financial resource strain: Not on file  . Food insecurity:    Worry: Not on file    Inability: Not on file  . Transportation needs:    Medical: Not on file     Non-medical: Not on file  Tobacco Use  . Smoking status: Never Smoker  . Smokeless tobacco: Never Used  Substance and Sexual Activity  . Alcohol use: Yes    Comment: occassional wine  . Drug use: No  . Sexual activity: Not Currently  Lifestyle  . Physical activity:    Days per week: Not on file    Minutes per session: Not on file  . Stress: Not on file  Relationships  . Social connections:    Talks on phone: Not on file    Gets together: Not on file    Attends religious service: Not on file    Active member of club or organization: Not on file    Attends meetings of clubs or organizations: Not on file  Relationship status: Not on file  . Intimate partner violence:    Fear of current or ex partner: Not on file    Emotionally abused: Not on file    Physically abused: Not on file    Forced sexual activity: Not on file  Other Topics Concern  . Not on file  Social History Narrative  . Not on file    REVIEW OF SYSTEMS: Constitutional: No fevers, chills, or sweats, no generalized fatigue, change in appetite Eyes: No visual changes, double vision, eye pain Ear, nose and throat: No hearing loss, ear pain, nasal congestion, sore throat Cardiovascular: No chest pain, palpitations Respiratory:  No shortness of breath at rest or with exertion, wheezes GastrointestinaI: No nausea, vomiting, diarrhea, abdominal pain, fecal incontinence Genitourinary:  No dysuria, urinary retention or frequency Musculoskeletal:  No neck pain, back pain Integumentary: No rash, pruritus, skin lesions Neurological: as above Psychiatric: No depression, insomnia, anxiety Endocrine: No palpitations, fatigue, diaphoresis, mood swings, change in appetite, change in weight, increased thirst Hematologic/Lymphatic:  No anemia, purpura, petechiae. Allergic/Immunologic: no itchy/runny eyes, nasal congestion, recent allergic reactions, rashes  PHYSICAL EXAM: Vitals:   10/30/17 0855  BP: (!) 170/78    Pulse: 96  SpO2: 96%   General: No acute distress Head:  Normocephalic/atraumatic Eyes: Fundoscopic exam shows bilateral sharp discs, no vessel changes, exudates, or hemorrhages Neck: supple, no paraspinal tenderness, full range of motion Back: No paraspinal tenderness Heart: regular rate and rhythm Lungs: Clear to auscultation bilaterally. Vascular: No carotid bruits. Skin/Extremities: No rash, no edema Neurological Exam: Mental status: alert and oriented to person, place, and time, no dysarthria or aphasia, Fund of knowledge is appropriate.  Recent and remote memory are intact.  Attention and concentration are normal.    Able to name objects and repeat phrases. Able to name 10 words starting with F in 1 minute (nl >11). CDT 5/5 MMSE - Mini Mental State Exam 10/30/2017 09/26/2017  Not completed: - (No Data)  Orientation to time 5 -  Orientation to Place 5 -  Registration 3 -  Attention/ Calculation 5 -  Recall 2 -  Language- name 2 objects 2 -  Language- repeat 1 -  Language- follow 3 step command 3 -  Language- read & follow direction 1 -  Write a sentence 1 -  Copy design 1 -  Total score 29 -   Cranial nerves: CN I: not tested CN II: pupils equal, round and reactive to light, visual fields intact, fundi unremarkable. CN III, IV, VI:  full range of motion, no nystagmus, no ptosis CN V: facial sensation intact CN VII: upper and lower face symmetric CN VIII: hearing intact to finger rub CN IX, X: gag intact, uvula midline CN XI: sternocleidomastoid and trapezius muscles intact CN XII: tongue midline Bulk & Tone: normal, no fasciculations. Motor: 5/5 throughout with no pronator drift, mild left UE weakness with mild orbiting around left arm Sensation: decreased pin on right UE and LE, decreased cold and pin in stocking distribution to ankles bilaterally, decreased vibration sense to knees bilaterally.  Romberg test positive Deep Tendon Reflexes: +2 on right UE and LE, brisk +3  on left UE and LE, no ankle clonus Plantar responses: downgoing bilaterally Cerebellar: no incoordination on finger to nose testing Gait: slow and cautious, unsteady, unable to tandem walk Tremor: none  IMPRESSION: This is a pleasant 77 year old right-handed woman with a history of hypertension, hyperlipidemia, diet-controlled diabetes, stroke in 1994 with minimal residual deficits, presenting for  evaluation of worsening memory. Her neurological exam shows decreased sensation on the right side, and evidence of a length-dependent neuropathy. MMSE today normal 29/30. We discussed different causes of memory loss, including age-related memory changes. Check TSH and B12. MRI brain without contrast will be ordered to assess for underlying structural abnormality. We discussed how mood can also affect memory, agree with follow-up with psychologist. She is reporting bowel/bladder incontinence, MRI lumbar spine did not show any acute changes to explain these symptoms, we will see if there are any changes on MRI brain such as NPH, although this would not cause bowel incontinence and she does not have typical gait (magnetic gait) seen with NPH, follow-up with PCP/GI/urology. We discussed the importance of control of vascular risk factors, physical exercise, and brain stimulation exercises for brain health. She will follow-up in 6 months and knows to call for any changes.   Thank you for allowing me to participate in the care of this patient. Please do not hesitate to call for any questions or concerns.   Ellouise Newer, M.D.  CC: Dr. Martinique

## 2017-10-31 LAB — TSH: TSH: 1.41 m[IU]/L (ref 0.40–4.50)

## 2017-10-31 LAB — VITAMIN B12: VITAMIN B 12: 951 pg/mL (ref 200–1100)

## 2017-11-02 ENCOUNTER — Telehealth: Payer: Self-pay

## 2017-11-02 NOTE — Telephone Encounter (Signed)
-----   Message from Cameron Sprang, MD sent at 10/31/2017 10:13 AM EDT ----- Pls let him know thyroid and B12 levels are normal. Thanks

## 2017-11-02 NOTE — Telephone Encounter (Signed)
Spoke with pt relaying message below.   

## 2017-11-03 ENCOUNTER — Ambulatory Visit
Admission: RE | Admit: 2017-11-03 | Discharge: 2017-11-03 | Disposition: A | Discharge status: Home / Self Care | Payer: Medicare Other | Source: Ambulatory Visit | Attending: Neurology | Admitting: Neurology

## 2017-11-03 DIAGNOSIS — R413 Other amnesia: Secondary | ICD-10-CM

## 2017-11-06 DIAGNOSIS — I10 Essential (primary) hypertension: Secondary | ICD-10-CM | POA: Diagnosis not present

## 2017-11-06 DIAGNOSIS — G8929 Other chronic pain: Secondary | ICD-10-CM | POA: Insufficient documentation

## 2017-11-06 DIAGNOSIS — M546 Pain in thoracic spine: Secondary | ICD-10-CM | POA: Diagnosis not present

## 2017-11-07 ENCOUNTER — Telehealth: Payer: Self-pay

## 2017-11-07 NOTE — Telephone Encounter (Signed)
-----   Message from Cameron Sprang, MD sent at 11/06/2017  1:34 PM EDT ----- Pls let patient know I reviewed MRI brain, no evidence of tumor, stroke, or bleed. It shows age-related changes. Thanks

## 2017-11-07 NOTE — Telephone Encounter (Signed)
LMOM asking for return call.  Did not relay message below.

## 2017-11-07 NOTE — Telephone Encounter (Signed)
Pt returned my call.  Relayed MRI results.

## 2017-11-21 DIAGNOSIS — L821 Other seborrheic keratosis: Secondary | ICD-10-CM | POA: Diagnosis not present

## 2017-11-21 DIAGNOSIS — L298 Other pruritus: Secondary | ICD-10-CM | POA: Diagnosis not present

## 2017-11-21 DIAGNOSIS — L570 Actinic keratosis: Secondary | ICD-10-CM | POA: Diagnosis not present

## 2017-11-21 DIAGNOSIS — D1801 Hemangioma of skin and subcutaneous tissue: Secondary | ICD-10-CM | POA: Diagnosis not present

## 2017-11-29 ENCOUNTER — Ambulatory Visit (INDEPENDENT_AMBULATORY_CARE_PROVIDER_SITE_OTHER): Payer: Medicare Other | Admitting: Psychology

## 2017-11-29 DIAGNOSIS — F331 Major depressive disorder, recurrent, moderate: Secondary | ICD-10-CM

## 2017-12-12 ENCOUNTER — Other Ambulatory Visit: Payer: Self-pay | Admitting: Family Medicine

## 2017-12-25 ENCOUNTER — Ambulatory Visit (INDEPENDENT_AMBULATORY_CARE_PROVIDER_SITE_OTHER): Payer: Medicare Other | Admitting: Psychology

## 2017-12-25 DIAGNOSIS — F331 Major depressive disorder, recurrent, moderate: Secondary | ICD-10-CM

## 2018-01-03 ENCOUNTER — Other Ambulatory Visit: Payer: Self-pay | Admitting: Family Medicine

## 2018-01-03 DIAGNOSIS — Z1231 Encounter for screening mammogram for malignant neoplasm of breast: Secondary | ICD-10-CM

## 2018-01-08 ENCOUNTER — Ambulatory Visit (INDEPENDENT_AMBULATORY_CARE_PROVIDER_SITE_OTHER): Payer: Medicare Other | Admitting: Psychology

## 2018-01-08 DIAGNOSIS — F331 Major depressive disorder, recurrent, moderate: Secondary | ICD-10-CM

## 2018-01-21 NOTE — Progress Notes (Signed)
HPI:   Alexa Brown is a 77 y.o. female, who is here today for 4 months follow up.   She was last seen on 09/22/17.  Since her last visit she has seen Dr. Delice Lesch, 10/30/2017 due to memory concerns. She states that she does not remember visit and not sure about recommendations.He daughter was there with ehr.   Diabetes Mellitus II:  Currently on non pharmacologic treatment.. Checking BS's : "Fluctuase" depending what she eats. Hypoglycemia:Negative.  . She denies abdominal pain, nausea, vomiting, polydipsia, polyuria, or polyphagia. + Peripheral neuropathy, she is on Cymbalta.   Lab Results  Component Value Date   HGBA1C 6.0 (H) 07/21/2017   Lab Results  Component Value Date   MICROALBUR <0.7 07/24/2017     Hypertension:  Currently on Benazepril 20 mg daily. Home BP readings: Not checking. She is taking medications as instructed, no side effects reported. Hx of CVA in 1994. CKD 3, she has not noted  She has not noted unusual headache, visual changes, exertional chest pain, dyspnea,  focal weakness, or edema.   Lab Results  Component Value Date   CREATININE 0.92 07/21/2017   BUN 16 07/21/2017   NA 140 07/21/2017   K 3.9 07/21/2017   CL 108 07/21/2017   CO2 24 07/21/2017   GERD: She is taking Zantac 300 mg at bedtime, which is helping with heartburn and acid reflux. She is not longer taking Protonix. Denies abdominal pain, nausea, vomiting, changes in bowel habits, blood in stool or melena.  Gout: She is on Allopurinol 100 mg 2 tabs daily. She has not had a gout attack in years. Tolerating medication well.   Hx of OA and back pain,unstable gait, uses a cane.   Depression: She is on Cymbalta 60 mg daily. Wellbutrin was weaned off because she did not feel like it was helping. She is feeling better. She is living with her daughter. She is followed with psychologist, she sees Dr Marlowe Sax once per week and she feels like it is  helping.  Insomnia: Melatonin 10 mg has helped. She is sleeping 5-8 hours.  Hyperlipidemia: Currently on Atorvastatin 40 mg daily. Following a low fat diet: Yes.  She has not noted side effects with medication.  Lab Results  Component Value Date   CHOL 176 05/26/2016   HDL 71.50 05/26/2016   LDLCALC 84 05/26/2016   TRIG 98.0 05/26/2016   CHOLHDL 2 05/26/2016   She is planning on volunteering for hospice and needs to update vaccination. TB screening. She needs Hep B vaccine.  C/O right great toenail color changes. THis has been going on for a while,she has had different treatments including "laser treatment" but problem re-occurs. She is using OTC topical fungal treatment.   Review of Systems  Constitutional: Negative for activity change, appetite change, fatigue, fever and unexpected weight change.  HENT: Negative for mouth sores, nosebleeds and trouble swallowing.   Eyes: Negative for redness and visual disturbance.  Respiratory: Negative for cough, shortness of breath and wheezing.   Cardiovascular: Negative for chest pain, palpitations and leg swelling.  Gastrointestinal: Negative for abdominal pain, nausea and vomiting.       Negative for changes in bowel habits.  Genitourinary: Negative for decreased urine volume, dysuria and hematuria.  Musculoskeletal: Positive for arthralgias and gait problem. Negative for myalgias.  Skin: Negative for rash and wound.  Neurological: Negative for syncope, weakness and headaches.  Psychiatric/Behavioral: Positive for sleep disturbance. Negative for confusion. The patient is  nervous/anxious.     Current Outpatient Medications on File Prior to Visit  Medication Sig Dispense Refill  . ACCU-CHEK AVIVA PLUS test strip USE TO TEST BLOOD SUGAR ONCE DAILY 100 each 3  . ACCU-CHEK SOFTCLIX LANCETS lancets Use to check blood sugar once daily 100 each 2  . atorvastatin (LIPITOR) 40 MG tablet TAKE ONE TABLET BY MOUTH DAILY 90 tablet 0  .  benazepril (LOTENSIN) 20 MG tablet TAKE ONE TABLET BY MOUTH DAILY 90 tablet 0  . buPROPion (WELLBUTRIN SR) 100 MG 12 hr tablet Take 1 tablet (100 mg total) by mouth daily. 30 tablet 3  . calcium carbonate (OSCAL) 1500 (600 Ca) MG TABS tablet Take by mouth 2 (two) times daily with a meal.    . Cholecalciferol (VITAMIN D3) 5000 units CAPS Take 1 capsule by mouth daily.    . clopidogrel (PLAVIX) 75 MG tablet TAKE ONE TABLET BY MOUTH DAILY 90 tablet 3  . DULoxetine (CYMBALTA) 60 MG capsule TAKE ONE CAPSULE BY MOUTH DAILY 90 capsule 2  . fluticasone (FLONASE) 50 MCG/ACT nasal spray use 2 sprays in each nostril AT BEDTIME    . L-Lysine 500 MG CAPS Take 1 capsule by mouth daily.    Elmore Guise Devices (ACCU-CHEK SOFTCLIX) lancets Use to test blood sugar once a day. 100 each 2  . latanoprost (XALATAN) 0.005 % ophthalmic solution Place 1 drop into both eyes at bedtime.    Marland Kitchen MELATONIN ER PO Take by mouth.    . Multiple Vitamins-Minerals (MULTIVITAMIN ADULT PO) Take by mouth daily.    . Omega-3 Fatty Acids (FISH OIL) 1000 MG CAPS Take by mouth.    Marland Kitchen PRESCRIPTION MEDICATION Take 1 capsule by mouth 2 (two) times daily. New remedy vitamin mixture for neuropathy    . ranitidine (ZANTAC) 300 MG tablet TAKE ONE TABLET BY MOUTH EVERY NIGHT AT BEDTIME 90 tablet 3  . triamcinolone (KENALOG) 0.025 % cream Apply 1 application topically 2 (two) times daily as needed. 30 g 1  . VITAMIN E PO Take by mouth daily.     No current facility-administered medications on file prior to visit.      Past Medical History:  Diagnosis Date  . Chicken pox   . Depression   . Diabetes mellitus without complication ([place])   . GERD (gastroesophageal reflux disease)   . Hyperlipidemia   . Hypertension   . Stroke ([place])   . UTI (urinary tract infection)    No Known Allergies  Social History   Socioeconomic History  . Marital status: Widowed    Spouse name: Not on file  . Number of children: Not on file  . Years of education:  Not on file  . Highest education level: Not on file  Occupational History  . Occupation: N/A  Social Needs  . Financial resource strain: Not on file  . Food insecurity:    Worry: Not on file    Inability: Not on file  . Transportation needs:    Medical: Not on file    Non-medical: Not on file  Tobacco Use  . Smoking status: Never Smoker  . Smokeless tobacco: Never Used  Substance and Sexual Activity  . Alcohol use: Yes    Comment: occassional wine  . Drug use: No  . Sexual activity: Not Currently  Lifestyle  . Physical activity:    Days per week: Not on file    Minutes per session: Not on file  . Stress: Not on file  Relationships  .  Social connections:    Talks on phone: Not on file    Gets together: Not on file    Attends religious service: Not on file    Active member of club or organization: Not on file    Attends meetings of clubs or organizations: Not on file    Relationship status: Not on file  Other Topics Concern  . Not on file  Social History Narrative   Pt lives in single story home with her daughter   Has 2 children   Some college education   Retired Engineer, production for [employer]:   01/22/18 0825  BP: 140/84  Pulse: 89  Resp: 12  Temp: 98.5 F (36.9 C)  SpO2: 96%   Body mass index is 24.43 kg/m.   Physical Exam  Nursing note and vitals reviewed. Constitutional: She is oriented to person, place, and time. She appears well-developed. No distress.  HENT:  Head: Normocephalic and atraumatic.  Mouth/Throat: Oropharynx is clear and moist and mucous membranes are normal.  Eyes: Pupils are equal, round, and reactive to light. Conjunctivae are normal.  Cardiovascular: Normal rate and regular rhythm.  No murmur heard. Pulses:      Dorsalis pedis pulses are 2+ on the right side, and 2+ on the left side.  Respiratory: Effort normal and breath sounds normal. No respiratory distress.  GI: Soft. She exhibits no mass. There is no  hepatomegaly. There is no tenderness.  Musculoskeletal: She exhibits no edema.  Lymphadenopathy:    She has no cervical adenopathy.  Neurological: She is alert and oriented to person, place, and time. She has normal strength. No cranial nerve deficit.  Mildly unstable gait with no assistance today.  Skin: Skin is warm. No rash noted. No erythema.  Right great toenail distal whitish/yellowish debris underneath nail, no periungual erythema.   Psychiatric: She has a normal mood and affect.  Well groomed, good eye contact.     ASSESSMENT AND PLAN:   Alexa Brown was seen today for 4 months follow-up.  Orders Placed This Encounter  Procedures  . Hepatitis B vaccine adult IM  . Hemoglobin A1c  . Comprehensive metabolic panel  . Lipid panel    Lab Results  Component Value Date   CHOL 167 01/22/2018   HDL 70.00 01/22/2018   LDLCALC 70 01/22/2018   TRIG 134.0 01/22/2018   CHOLHDL 2 01/22/2018   Lab Results  Component Value Date   CREATININE 1.08 01/22/2018   BUN 20 01/22/2018   NA 140 01/22/2018   K 4.7 01/22/2018   CL 104 01/22/2018   CO2 28 01/22/2018   Lab Results  Component Value Date   ALT 18 01/22/2018   AST 22 01/22/2018   ALKPHOS 63 01/22/2018   BILITOT 0.5 01/22/2018   Lab Results  Component Value Date   HGBA1C 6.0 01/22/2018     DM (diabetes mellitus), type 2 with neurological complications ([place]) QPY1P has been at at goal. Continue non pharmacologic treatment. Regular exercise and healthy diet with avoidance of added sugar food intake is an important part of treatment and recommended. Annual eye exam, periodic dental and foot care recommended. F/U in 5-6 months  -     Hemoglobin A1c -     Comprehensive metabolic panel  Systolic hypertension with cerebrovascular disease Adequate BP for her. No changes in current management. Continue low salt diet. F/U in 5-6 months.  CKD (chronic kidney disease), stage III ([facility]) Continue low salt  diet. Avoid NSAID's. Well DM and HTN control. Continue Benazepril.  Moderate episode of recurrent major depressive disorder ([place]) She will continue weaning off Wellbutrin. No changes in Cymbalta. Continue following with psychotherapist. F/U in 5-6 months.  Dyslipidemia No changes in current management, will follow labs done today and will give further recommendations accordingly.  -     Lipid panel  Onychomycosis of great toe Chronic. Recommend applying vicks vapor rub daily at night on toenail.  Gout, arthropathy  Asymptomatic. Some side effects of meds discussed.'She agrees with decreasing dose of Allopurinol from 200 mg to 100 mg. Colchicine 0.6 mg bid for a week then daily as needed. F/U in 5-6 months.   -     allopurinol (ZYLOPRIM) 100 MG tablet; Take 1 tablet (100 mg total) by mouth daily. -     colchicine 0.6 MG tablet; Take 1 tablet (0.6 mg total) by mouth daily as needed.  Need for hepatitis B vaccination -     Hepatitis B vaccine adult IM      Catina G. Martinique, MD  [facility]. [place] office.

## 2018-01-22 ENCOUNTER — Ambulatory Visit (INDEPENDENT_AMBULATORY_CARE_PROVIDER_SITE_OTHER): Payer: Medicare Other | Admitting: Family Medicine

## 2018-01-22 ENCOUNTER — Ambulatory Visit (INDEPENDENT_AMBULATORY_CARE_PROVIDER_SITE_OTHER): Payer: Medicare Other | Admitting: Psychology

## 2018-01-22 ENCOUNTER — Encounter: Payer: Self-pay | Admitting: Family Medicine

## 2018-01-22 VITALS — BP 140/84 | HR 89 | Temp 98.5°F | Resp 12 | Ht 66.0 in | Wt 151.4 lb

## 2018-01-22 DIAGNOSIS — N183 Chronic kidney disease, stage 3 unspecified: Secondary | ICD-10-CM

## 2018-01-22 DIAGNOSIS — I674 Hypertensive encephalopathy: Secondary | ICD-10-CM

## 2018-01-22 DIAGNOSIS — M109 Gout, unspecified: Secondary | ICD-10-CM

## 2018-01-22 DIAGNOSIS — E1149 Type 2 diabetes mellitus with other diabetic neurological complication: Secondary | ICD-10-CM | POA: Diagnosis not present

## 2018-01-22 DIAGNOSIS — F331 Major depressive disorder, recurrent, moderate: Secondary | ICD-10-CM | POA: Diagnosis not present

## 2018-01-22 DIAGNOSIS — B351 Tinea unguium: Secondary | ICD-10-CM

## 2018-01-22 DIAGNOSIS — E785 Hyperlipidemia, unspecified: Secondary | ICD-10-CM | POA: Diagnosis not present

## 2018-01-22 DIAGNOSIS — Z23 Encounter for immunization: Secondary | ICD-10-CM | POA: Diagnosis not present

## 2018-01-22 LAB — LIPID PANEL
Cholesterol: 167 mg/dL (ref 0–200)
HDL: 70 mg/dL (ref >39.00)
LDL CALC: 70 mg/dL (ref 0–99)
NONHDL: 96.55
Total CHOL/HDL Ratio: 2
Triglycerides: 134 mg/dL (ref 0.0–149.0)
VLDL: 26.8 mg/dL (ref 0.0–40.0)

## 2018-01-22 LAB — COMPREHENSIVE METABOLIC PANEL
ALK PHOS: 63 U/L (ref 39–117)
ALT: 18 U/L (ref 0–35)
AST: 22 U/L (ref 0–37)
Albumin: 4.2 g/dL (ref 3.5–5.2)
BUN: 20 mg/dL (ref 6–23)
CHLORIDE: 104 meq/L (ref 96–112)
CO2: 28 mEq/L (ref 19–32)
Calcium: 10 mg/dL (ref 8.4–10.5)
Creatinine, Ser: 1.08 mg/dL (ref 0.40–1.20)
GFR: 52.48 mL/min — AB (ref >60.00)
GLUCOSE: 111 mg/dL — AB (ref 70–99)
POTASSIUM: 4.7 meq/L (ref 3.5–5.1)
SODIUM: 140 meq/L (ref 135–145)
TOTAL PROTEIN: 6.7 g/dL (ref 6.0–8.3)
Total Bilirubin: 0.5 mg/dL (ref 0.2–1.2)

## 2018-01-22 LAB — HEMOGLOBIN A1C: HEMOGLOBIN A1C: 6 % (ref 4.6–6.5)

## 2018-01-22 MED ORDER — ALLOPURINOL 100 MG PO TABS: 100.0000 mg | ORAL_TABLET | Freq: Every day | ORAL | 0 refills | Status: DC

## 2018-01-22 MED ORDER — COLCHICINE 0.6 MG PO TABS: 0.6000 mg | ORAL_TABLET | Freq: Every day | ORAL | 1 refills | Status: DC | PRN

## 2018-01-22 NOTE — Patient Instructions (Addendum)
A few things to remember from today's visit:   DM (diabetes mellitus), type 2 with neurological complications ([place]) - Plan: Hemoglobin A1c, Comprehensive metabolic panel  Systolic hypertension with cerebrovascular disease  CKD (chronic kidney disease), stage III (HCC)  Moderate episode of recurrent major depressive disorder ([facility])  Dyslipidemia - Plan: Lipid panel  Daily Vick Vapor rub on toe nail. Decrease Allopurinol from 2 tabs to 1 tab. Stop Atorvastatin while taking Colchicine.   Wellbutrin every other day for a week then every 3th day for another week and stop.   Please be sure medication list is accurate. If a new problem present, please set up appointment sooner than planned today.

## 2018-01-25 ENCOUNTER — Encounter: Payer: Self-pay | Admitting: Family Medicine

## 2018-01-30 ENCOUNTER — Ambulatory Visit: Payer: Self-pay | Admitting: Psychology

## 2018-01-30 ENCOUNTER — Emergency Department (HOSPITAL_COMMUNITY): Payer: Medicare Other

## 2018-01-30 ENCOUNTER — Emergency Department (HOSPITAL_COMMUNITY)
Admission: EM | Admit: 2018-01-30 | Discharge: 2018-01-30 | Disposition: A | Discharge status: Home / Self Care | Payer: Medicare Other | Attending: Emergency Medicine | Admitting: Emergency Medicine

## 2018-01-30 ENCOUNTER — Other Ambulatory Visit: Payer: Self-pay

## 2018-01-30 ENCOUNTER — Encounter (HOSPITAL_COMMUNITY): Payer: Self-pay

## 2018-01-30 DIAGNOSIS — S0181XA Laceration without foreign body of other part of head, initial encounter: Secondary | ICD-10-CM | POA: Diagnosis not present

## 2018-01-30 DIAGNOSIS — Y9389 Activity, other specified: Secondary | ICD-10-CM | POA: Diagnosis not present

## 2018-01-30 DIAGNOSIS — S0191XA Laceration without foreign body of unspecified part of head, initial encounter: Secondary | ICD-10-CM | POA: Diagnosis not present

## 2018-01-30 DIAGNOSIS — R000 Tachycardia, unspecified: Secondary | ICD-10-CM | POA: Diagnosis not present

## 2018-01-30 DIAGNOSIS — S199XXA Unspecified injury of neck, initial encounter: Secondary | ICD-10-CM | POA: Diagnosis not present

## 2018-01-30 DIAGNOSIS — F329 Major depressive disorder, single episode, unspecified: Secondary | ICD-10-CM | POA: Insufficient documentation

## 2018-01-30 DIAGNOSIS — Z8673 Personal history of transient ischemic attack (TIA), and cerebral infarction without residual deficits: Secondary | ICD-10-CM | POA: Insufficient documentation

## 2018-01-30 DIAGNOSIS — Y998 Other external cause status: Secondary | ICD-10-CM | POA: Diagnosis not present

## 2018-01-30 DIAGNOSIS — W19XXXA Unspecified fall, initial encounter: Secondary | ICD-10-CM

## 2018-01-30 DIAGNOSIS — Z79899 Other long term (current) drug therapy: Secondary | ICD-10-CM | POA: Diagnosis not present

## 2018-01-30 DIAGNOSIS — S0101XA Laceration without foreign body of scalp, initial encounter: Secondary | ICD-10-CM | POA: Insufficient documentation

## 2018-01-30 DIAGNOSIS — Y9222 Religious institution as the place of occurrence of the external cause: Secondary | ICD-10-CM | POA: Diagnosis not present

## 2018-01-30 DIAGNOSIS — S161XXA Strain of muscle, fascia and tendon at neck level, initial encounter: Secondary | ICD-10-CM

## 2018-01-30 DIAGNOSIS — S0990XA Unspecified injury of head, initial encounter: Secondary | ICD-10-CM | POA: Diagnosis not present

## 2018-01-30 DIAGNOSIS — S0003XA Contusion of scalp, initial encounter: Secondary | ICD-10-CM | POA: Diagnosis not present

## 2018-01-30 DIAGNOSIS — I10 Essential (primary) hypertension: Secondary | ICD-10-CM | POA: Diagnosis not present

## 2018-01-30 DIAGNOSIS — E1122 Type 2 diabetes mellitus with diabetic chronic kidney disease: Secondary | ICD-10-CM | POA: Insufficient documentation

## 2018-01-30 DIAGNOSIS — I129 Hypertensive chronic kidney disease with stage 1 through stage 4 chronic kidney disease, or unspecified chronic kidney disease: Secondary | ICD-10-CM | POA: Diagnosis not present

## 2018-01-30 DIAGNOSIS — N183 Chronic kidney disease, stage 3 (moderate): Secondary | ICD-10-CM | POA: Diagnosis not present

## 2018-01-30 DIAGNOSIS — W0110XA Fall on same level from slipping, tripping and stumbling with subsequent striking against unspecified object, initial encounter: Secondary | ICD-10-CM | POA: Insufficient documentation

## 2018-01-30 DIAGNOSIS — R404 Transient alteration of awareness: Secondary | ICD-10-CM | POA: Diagnosis not present

## 2018-01-30 LAB — CBC
HCT: 39 % (ref 36.0–46.0)
HEMOGLOBIN: 13 g/dL (ref 12.0–15.0)
MCH: 29.8 pg (ref 26.0–34.0)
MCHC: 33.3 g/dL (ref 30.0–36.0)
MCV: 89.4 fL (ref 80.0–100.0)
NRBC: 0 % (ref 0.0–0.2)
Platelets: 237 10*3/uL (ref 150–400)
RBC: 4.36 MIL/uL (ref 3.87–5.11)
RDW: 12.5 % (ref 11.5–15.5)
WBC: 7 10*3/uL (ref 4.0–10.5)

## 2018-01-30 LAB — BASIC METABOLIC PANEL
ANION GAP: 7 (ref 5–15)
BUN: 20 mg/dL (ref 8–23)
CALCIUM: 8.4 mg/dL — AB (ref 8.9–10.3)
CHLORIDE: 110 mmol/L (ref 98–111)
CO2: 24 mmol/L (ref 22–32)
Creatinine, Ser: 0.98 mg/dL (ref 0.44–1.00)
GFR calc non Af Amer: 55 mL/min — ABNORMAL LOW (ref >60)
Glucose, Bld: 165 mg/dL — ABNORMAL HIGH (ref 70–99)
POTASSIUM: 3 mmol/L — AB (ref 3.5–5.1)
Sodium: 141 mmol/L (ref 135–145)

## 2018-01-30 MED ORDER — ACETAMINOPHEN 325 MG PO TABS
650.0000 mg | ORAL_TABLET | Freq: Once | ORAL | Status: AC
  Administered 2018-01-30: 650 mg via ORAL
  Filled 2018-01-30: qty 2

## 2018-01-30 MED ORDER — LIDOCAINE-EPINEPHRINE (PF) 2 %-1:200000 IJ SOLN
10.0000 mL | Freq: Once | INTRAMUSCULAR | Status: AC
  Administered 2018-01-30: 10 mL
  Filled 2018-01-30: qty 10

## 2018-01-30 NOTE — ED Provider Notes (Signed)
LACERATION REPAIR Performed by: Margarita Mail Authorized by: Margarita Mail Consent: Verbal consent obtained. Risks and benefits: risks, benefits and alternatives were discussed Consent given by: patient Patient identity confirmed: provided demographic data Prepped and Draped in normal sterile fashion Wound explored  Laceration Location: R forehead  Laceration Length: 3 cm  No Foreign Bodies seen or palpated  Anesthesia: local infiltration  Local anesthetic: lidocaine 2 % w epinephrine  Anesthetic total: 3 ml  Irrigation method: syringe Amount of cleaning: standard  Skin closure: fast gut  Number of sutures: 6  Technique: SI  Patient tolerance: Patient tolerated the procedure well with no immediate complications.   LACERATION REPAIR Performed by: Margarita Mail Authorized by: Margarita Mail Consent: Verbal consent obtained. Risks and benefits: risks, benefits and alternatives were discussed Consent given by: patient Patient identity confirmed: provided demographic data Prepped and Draped in normal sterile fashion Wound explored  Laceration Location: left scalp  Laceration Length: 4 cm  No Foreign Bodies seen or palpated  Anesthesia: local infiltration  Local anesthetic: lidocaine 2 % w epinephrine  Anesthetic total: 5 ml  Irrigation method: syringe Amount of cleaning: Copious  Skin closure: 3.0 vicryl for hemostasis of arterial bleed Staples  Number of sutures: 1 figure of 8, 2 staples   Patient tolerance: Patient tolerated the procedure well with no immediate complications.    Margarita Mail, PA-C 01/30/18 1734    Dorie Rank, MD 02/02/18 [phone]

## 2018-01-30 NOTE — ED Provider Notes (Signed)
Pt signed out by Dr. Tomi Bamberger pending CT cervical spine results.  IMPRESSION: 1. No acute fracture or dislocation of the cervical spine. 2. Moderate cervical spondylosis greatest at the C5-C7 levels.  Pt is feeling better.  She knows to return if worse.  F/u with pcp.  Sutures to be removed in 5 to 7 days.   Isla Pence, MD 01/30/18 [phone]

## 2018-01-30 NOTE — ED Notes (Signed)
Pt off floor to CT, suture cart and lidocaine at bedside for i/c of forehead lac.

## 2018-01-30 NOTE — ED Provider Notes (Signed)
[facility] EMERGENCY DEPARTMENT Provider Note   CSN: [id number] Arrival date & time: 01/30/18  1343     History   Chief Complaint Chief Complaint  Patient presents with  . Fall    HPI Alexa Brown is a 77 y.o. female.  HPI Patient presents to the emergency room for evaluation after a fall.  Patient states she has a history of some balance trouble and history of falls.  She was walking outside today and she felt like her feet started moving faster than her body and she ended up falling forward striking her head on a curb.  She is not sure if she lost consciousness.  She denies any pain other than in her head.  She denies neck pain or chest pain.  No abdominal pain.  Patient denies any numbness or weakness.  No pain in her extremities. Past Medical History:  Diagnosis Date  . Chicken pox   . Depression   . Diabetes mellitus without complication ([place])   . GERD (gastroesophageal reflux disease)   . Hyperlipidemia   . Hypertension   . Stroke ([place])   . UTI (urinary tract infection)     Patient Active Problem List   Diagnosis Date Noted  . Gout, arthropathy 01/22/2018  . Lower back pain 09/22/2017  . Unstable gait 09/22/2017  . Seborrheic dermatitis 01/23/2017  . Seborrheic keratosis 09/27/2016  . CKD (chronic kidney disease), stage III ([place]) 05/28/2016  . GERD (gastroesophageal reflux disease) 02/25/2016  . CVA (cerebral vascular accident) (HCC)-No residual deficit 02/12/2016  . Glaucoma 02/12/2016  . Depression, major, recurrent ([place]) 02/12/2016  . Dyslipidemia 02/12/2016  . OSA (obstructive sleep apnea) 02/12/2016  . Vitamin D deficiency 01/14/2016  . DM (diabetes mellitus), type 2 with neurological complications ([place]) 74/25/9563  . Systolic hypertension with cerebrovascular disease 01/14/2016  . Peripheral neuropathy 01/14/2016    Past Surgical History:  Procedure Laterality Date  . ABDOMINAL HYSTERECTOMY    . APPENDECTOMY    . BREAST  BIOPSY Left 2014  . BREAST SURGERY     biopsy     OB History   None      Home Medications    Prior to Admission medications   Medication Sig Start Date End Date Taking? Authorizing Provider  allopurinol (ZYLOPRIM) 100 MG tablet Take 1 tablet (100 mg total) by mouth daily. 01/22/18  Yes Martinique, Elzora G, MD  atorvastatin (LIPITOR) 40 MG tablet TAKE ONE TABLET BY MOUTH DAILY Patient taking differently: Take 40 mg by mouth daily.  12/12/17  Yes Martinique, Polly G, MD  benazepril (LOTENSIN) 20 MG tablet TAKE ONE TABLET BY MOUTH DAILY Patient taking differently: Take 20 mg by mouth daily.  11/03/16  Yes Martinique, Sueanne G, MD  buPROPion [facility] SR) 100 MG 12 hr tablet Take 1 tablet (100 mg total) by mouth daily. 09/22/17  Yes Martinique, Maisey G, MD  calcium carbonate (OSCAL) 1500 (600 Ca) MG TABS tablet Take 600 mg of elemental calcium by mouth at bedtime.    Yes [provider]  Cholecalciferol (VITAMIN D3) 5000 units CAPS Take 5,000 Units by mouth at bedtime.    Yes [provider]  clopidogrel (PLAVIX) 75 MG tablet TAKE ONE TABLET BY MOUTH DAILY Patient taking differently: 75 mg daily.  08/08/17  Yes Martinique, Jannell G, MD  colchicine 0.6 MG tablet Take 1 tablet (0.6 mg total) by mouth daily as needed. Patient taking differently: Take 0.6 mg by mouth 2 (two) times daily.  01/22/18  Yes Martinique, Anetria G, MD  DULoxetine (CYMBALTA) 60 MG capsule TAKE ONE CAPSULE BY MOUTH DAILY Patient taking differently: Take 60 mg by mouth daily.  08/08/17  Yes Martinique, Kayslee G, MD  fluticasone [facility]) 50 MCG/ACT nasal spray Place 2 sprays into both nostrils as needed for allergies.  02/10/15  Yes [provider]  L-Lysine 500 MG CAPS Take 500 mg by mouth at bedtime.    Yes [provider]  latanoprost (XALATAN) 0.005 % ophthalmic solution Place 1 drop into both eyes at bedtime.   Yes [provider]  Melatonin 10 MG TBCR Take 10 mg by mouth at bedtime.    Yes [provider]  Multiple Vitamins-Minerals (MULTIVITAMIN ADULT PO) Take 1 tablet by mouth daily.    Yes [provider]  Omega-3 Fatty Acids (FISH OIL) 1000 MG CAPS Take 1,000 mg by mouth at bedtime.    Yes [provider]  PRESCRIPTION MEDICATION Take 1 capsule by mouth 2 (two) times daily. New remedy vitamin mixture for neuropathy   Yes [provider]  ranitidine (ZANTAC) 300 MG tablet TAKE ONE TABLET BY MOUTH EVERY NIGHT AT BEDTIME Patient taking differently: Take 300 mg by mouth at bedtime.  10/04/17  Yes Martinique, Clarie G, MD  triamcinolone (KENALOG) 0.025 % cream Apply 1 application topically 2 (two) times daily as needed. 01/27/17  Yes Martinique, Giamarie G, MD  VITAMIN E PO Take 1 tablet by mouth at bedtime.    Yes [provider]  ACCU-CHEK AVIVA PLUS test strip USE TO TEST BLOOD SUGAR ONCE DAILY 07/24/17   Martinique, Raegan G, MD  ACCU-CHEK [facility] LANCETS lancets Use to check blood sugar once daily 07/25/17   Martinique, Thanvi G, MD  Lancet Devices [facility]) lancets Use to test blood sugar once a day. 03/31/16   Martinique, Shambria G, MD    Family History Family History  Problem Relation Age of Onset  . Hyperlipidemia Mother   . Heart disease Mother   . Stroke Mother   . Hypertension Mother   . Hyperlipidemia Father   . Heart disease Father   . Stroke Father   . Hypertension Father   . Asthma Father   . Parkinson's disease Sister     Social History Social History   Tobacco Use  . Smoking status: Never Smoker  . Smokeless tobacco: Never Used  Substance Use Topics  . Alcohol use: Yes    Comment: occassional wine  . Drug use: No     Allergies   Patient has no known allergies.   Review of Systems Review of Systems  All other systems reviewed and are negative.    Physical Exam Updated Vital Signs BP (!) 160/62   Pulse 90   Temp 98.8 F (37.1 C) (Oral)   Resp 18   SpO2 90%   Physical Exam  Constitutional: She appears  well-developed and well-nourished. No distress.  HENT:  Head: Normocephalic.  Right Ear: External ear normal.  Left Ear: External ear normal.  Laceration right temporal region, bleeding stopped with pressure  Eyes: Conjunctivae are normal. Right eye exhibits no discharge. Left eye exhibits no discharge. No scleral icterus.  Neck: Neck supple. No tracheal deviation present.  Cardiovascular: Normal rate, regular rhythm and intact distal pulses.  Pulmonary/Chest: Effort normal and breath sounds normal. No stridor. No respiratory distress. She has no wheezes. She has no rales.  Abdominal: Soft. Bowel sounds are normal. She exhibits no distension. There is no tenderness. There is  no rebound and no guarding.  Musculoskeletal: She exhibits no edema or tenderness.       Right shoulder: She exhibits no tenderness, no bony tenderness and no swelling.       Left shoulder: She exhibits no tenderness, no bony tenderness and no swelling.       Right wrist: She exhibits no tenderness, no bony tenderness and no swelling.       Left wrist: She exhibits no tenderness, no bony tenderness and no swelling.       Right hip: She exhibits normal range of motion, no tenderness, no bony tenderness and no swelling.       Left hip: She exhibits normal range of motion, no tenderness and no bony tenderness.       Right ankle: She exhibits no swelling. No tenderness.       Left ankle: She exhibits no swelling. No tenderness.       Cervical back: She exhibits no tenderness, no bony tenderness and no swelling.       Thoracic back: She exhibits no tenderness, no bony tenderness and no swelling.       Lumbar back: She exhibits no tenderness, no bony tenderness and no swelling.  Neurological: She is alert. She has normal strength. No cranial nerve deficit (no facial droop, extraocular movements intact, no slurred speech) or sensory deficit. She exhibits normal muscle tone. She displays no seizure activity. Coordination normal.    Skin: Skin is warm and dry. No rash noted.  Psychiatric: She has a normal mood and affect.  Nursing note and vitals reviewed.    ED Treatments / Results  Labs (all labs ordered are listed, but only abnormal results are displayed) Labs Reviewed  BASIC METABOLIC PANEL - Abnormal; Notable for the following components:      Result Value   Potassium 3.0 (*)    Glucose, Bld 165 (*)    Calcium 8.4 (*)    GFR calc non Af Amer 55 (*)    All other components within normal limits  CBC    EKG EKG Interpretation  Date/Time:  Tuesday January 30 2018 13:55:11 EST Ventricular Rate:  93 PR Interval:    QRS Duration: 76 QT Interval:  344 QTC Calculation: 428 R Axis:   68 Text Interpretation:  Sinus rhythm Ventricular premature complex pvc is new Confirmed by Dorie Rank [phone]) on 01/30/2018 1:59:16 PM   Radiology Ct Head Wo Contrast  Result Date: 01/30/2018 CLINICAL DATA:  Right frontal scalp hematoma status post fall. No loss of consciousness. EXAM: CT HEAD WITHOUT CONTRAST TECHNIQUE: Contiguous axial images were obtained from the base of the skull through the vertex without intravenous contrast. COMPARISON:  MRI brain 11/03/2017. FINDINGS: Brain: There is no evidence of acute intracranial hemorrhage, mass lesion, brain edema or extra-axial fluid collection. There is stable atrophy with prominence of the ventricles and subarachnoid spaces. There is patchy periventricular white matter disease, similar to the previous MRI. There is no CT evidence of acute cortical infarction. Vascular: Mild intracranial atherosclerosis. No hyperdense vessel identified. Skull: Negative for fracture or focal lesion. Sinuses/Orbits: There are postsurgical changes in the maxillary and ethmoid sinuses bilaterally. No mucosal thickening or air-fluid levels identified. The mastoid air cells and middle ears are clear. No significant orbital findings. Other: Left parietal scalp injury noted. Possible mild soft tissue  swelling in the right frontal scalp. IMPRESSION: 1. No acute intracranial or calvarial findings. 2. Atrophy and chronic small vessel ischemic changes in the periventricular white matter,  similar to previous MRI. 3. Left parietal and possible right frontal scalp soft tissue injuries. No evidence of calvarial fracture. Electronically Signed   By: Richardean Sale M.D.   On: 01/30/2018 14:37    Procedures Procedures (including critical care time)  Medications Ordered in ED Medications  lidocaine-EPINEPHrine (XYLOCAINE W/EPI) 2 %-1:200000 (PF) injection 10 mL (has no administration in time range)  acetaminophen (TYLENOL) tablet 650 mg (has no administration in time range)     Initial Impression / Assessment and Plan / ED Course  I have reviewed the triage vital signs and the nursing notes.  Pertinent labs & imaging results that were available during my care of the patient were reviewed by me and considered in my medical decision making (see chart for details).  Clinical Course as of Jan 30 1609  Tue Jan 30, 2018  1532 Pt now states her neck hurts.   Will add on C spine CT   [JK]    Clinical Course User Index [JK] Dorie Rank, MD   S/p mechanical fall CT scan without acute findings.  Labs are normal.  Pt started complaining of neck pain after the head CT.   Will add on c spine CT.  Pt appears stable for discharge if the CT scan is normal  Final Clinical Impressions(s) / ED Diagnoses   Final diagnoses:  Laceration of head without foreign body, unspecified part of head, initial encounter  Fall, initial encounter      Dorie Rank, MD 01/30/18 1610

## 2018-01-30 NOTE — ED Notes (Signed)
Family at bedside. 

## 2018-01-30 NOTE — ED Triage Notes (Signed)
[facility]- pt coming from urgent care after she had a fall at [place]. Described as mechanical fall. No LOC. Pt has hematoma to the right side of her forehead. Significant blood loss.

## 2018-01-30 NOTE — ED Notes (Signed)
Pt returned from radiology.

## 2018-01-30 NOTE — ED Notes (Signed)
Pt bathed and linens changed aftger sutures and staples placed to head.  Linen and patient covered in blood clots.  Upon sitting patient up to chair at side of bed, pt stated she did not feel well and BP dropped acutely to 70s (from 120s). Pt seemed to have vagaled from site of blood on bed while changing linens as initially sitting and standing held no problem for her. Pt back to bed and bp rechecked, quickly came back up to 120s SBP.

## 2018-01-30 NOTE — ED Notes (Signed)
Pt back from CT to room B 15

## 2018-02-02 ENCOUNTER — Ambulatory Visit (INDEPENDENT_AMBULATORY_CARE_PROVIDER_SITE_OTHER): Payer: Medicare Other | Admitting: Family Medicine

## 2018-02-02 ENCOUNTER — Encounter: Payer: Self-pay | Admitting: Family Medicine

## 2018-02-05 NOTE — Progress Notes (Signed)
Error  Appointment cancelled as too soon to have staples removed.  Advised to make an appointment for Tuesday 02/06/18 at the earliest, but no later than Friday 02/09/18.

## 2018-02-06 ENCOUNTER — Ambulatory Visit
Admission: RE | Admit: 2018-02-06 | Discharge: 2018-02-06 | Disposition: A | Discharge status: Home / Self Care | Payer: Medicare Other | Source: Ambulatory Visit | Attending: Family Medicine | Admitting: Family Medicine

## 2018-02-06 ENCOUNTER — Other Ambulatory Visit: Payer: Self-pay | Admitting: Family Medicine

## 2018-02-06 ENCOUNTER — Encounter: Payer: Self-pay | Admitting: Family Medicine

## 2018-02-06 ENCOUNTER — Ambulatory Visit (INDEPENDENT_AMBULATORY_CARE_PROVIDER_SITE_OTHER): Payer: Medicare Other | Admitting: Family Medicine

## 2018-02-06 VITALS — BP 124/83 | HR 99 | Temp 98.0°F | Resp 12 | Ht 66.0 in | Wt 149.2 lb

## 2018-02-06 DIAGNOSIS — I674 Hypertensive encephalopathy: Secondary | ICD-10-CM | POA: Diagnosis not present

## 2018-02-06 DIAGNOSIS — R2681 Unsteadiness on feet: Secondary | ICD-10-CM

## 2018-02-06 DIAGNOSIS — S0181XD Laceration without foreign body of other part of head, subsequent encounter: Secondary | ICD-10-CM | POA: Diagnosis not present

## 2018-02-06 DIAGNOSIS — E876 Hypokalemia: Secondary | ICD-10-CM | POA: Diagnosis not present

## 2018-02-06 DIAGNOSIS — Z0000 Encounter for general adult medical examination without abnormal findings: Secondary | ICD-10-CM

## 2018-02-06 DIAGNOSIS — Z1231 Encounter for screening mammogram for malignant neoplasm of breast: Secondary | ICD-10-CM

## 2018-02-06 DIAGNOSIS — W19XXXD Unspecified fall, subsequent encounter: Secondary | ICD-10-CM | POA: Diagnosis not present

## 2018-02-06 DIAGNOSIS — F331 Major depressive disorder, recurrent, moderate: Secondary | ICD-10-CM

## 2018-02-06 NOTE — Patient Instructions (Addendum)
A few things to remember from today's visit:   Hypokalemia - Plan: Potassium  Laceration of skin of forehead, subsequent encounter  Fall, accidental, subsequent encounter   Please be sure medication list is accurate. If a new problem present, please set up appointment sooner than planned today.

## 2018-02-06 NOTE — Progress Notes (Signed)
HPI:   [name] is a 77 y.o. female, who is here today with her daughter to follow on recent ED visit.  She presented to the ED on 01/30/2018 after a fall. She has history of unstable gait, she uses a cane for assistance.  Fall happened while she was walking outside, she did strike her head on a curb. She was not using her cane when fall occurred. Denies LOC. 3 cm laceration on forehead right side, repaired with 6 sutures.  Recovering well. No fever,headace, visual changes, MS changes.  Lab Results  Component Value Date   WBC 7.0 01/30/2018   HGB 13.0 01/30/2018   HCT 39.0 01/30/2018   MCV 89.4 01/30/2018   PLT 237 01/30/2018    Lab Results  Component Value Date   CREATININE 0.98 01/30/2018   BUN 20 01/30/2018   NA 141 01/30/2018   K 3.0 (L) 01/30/2018   CL 110 01/30/2018   CO2 24 01/30/2018    Head and cervical CT were done. Head CT: Negative for intracranial or calvarial findings.  Atrophy and chronic small vessel ischemic changes in the periventricular white matter, similar to previous MRI. Left parietal and possible right frontal scalp soft tissue injuries no evidence of calvarial fracture.  Cervical spine CT: 1. No acute fracture or dislocation of the cervical spine. 2. Moderate cervical spondylosis greatest at the C5-C7 levels.  Since her last OV she had a home visit from her health insurance, she has some questions in this regard.  She was told she needed DEXA,foot exam,treatment for urine leakage. Hx of urine incontinence,stable.  She is also concerned about elevated BP and difference in reading between RUE and LUE.  HTN on Benazepril 20 mg daily Denies severe/frequent headache, visual changes, chest pain, dyspnea, palpitation, claudication, new focal weakness, or edema.  She is not checking BP at home.  BP during home visit was LUE 142/82 and RUE 154/88.   Review of Systems  Constitutional: Negative for activity change,  appetite change, fatigue and fever.  HENT: Negative for mouth sores, nosebleeds and trouble swallowing.   Eyes: Negative for redness and visual disturbance.  Respiratory: Negative for cough, shortness of breath and wheezing.   Cardiovascular: Negative for chest pain, palpitations and leg swelling.  Gastrointestinal: Negative for abdominal pain, nausea and vomiting.       Negative for changes in bowel habits.  Genitourinary: Negative for decreased urine volume, dysuria and hematuria.  Musculoskeletal: Positive for arthralgias, back pain and gait problem.  Skin: Negative for rash and wound.  Neurological: Negative for syncope, weakness and headaches.      Current Outpatient Medications on File Prior to Visit  Medication Sig Dispense Refill  . ACCU-CHEK AVIVA PLUS test strip USE TO TEST BLOOD SUGAR ONCE DAILY 100 each 3  . ACCU-CHEK SOFTCLIX LANCETS lancets Use to check blood sugar once daily 100 each 2  . allopurinol (ZYLOPRIM) 100 MG tablet Take 1 tablet (100 mg total) by mouth daily. 90 tablet 0  . atorvastatin (LIPITOR) 40 MG tablet TAKE ONE TABLET BY MOUTH DAILY (Patient taking differently: Take 40 mg by mouth daily. ) 90 tablet 0  . benazepril (LOTENSIN) 20 MG tablet TAKE ONE TABLET BY MOUTH DAILY (Patient taking differently: Take 20 mg by mouth daily. ) 90 tablet 0  . calcium carbonate (OSCAL) 1500 (600 Ca) MG TABS tablet Take 600 mg of elemental calcium by mouth at bedtime.     . Cholecalciferol (VITAMIN D3) 5000  units CAPS Take 5,000 Units by mouth at bedtime.     . clopidogrel (PLAVIX) 75 MG tablet TAKE ONE TABLET BY MOUTH DAILY (Patient taking differently: 75 mg daily. ) 90 tablet 3  . colchicine 0.6 MG tablet Take 1 tablet (0.6 mg total) by mouth daily as needed. (Patient taking differently: Take 0.6 mg by mouth 2 (two) times daily. ) 30 tablet 1  . DULoxetine (CYMBALTA) 60 MG capsule TAKE ONE CAPSULE BY MOUTH DAILY (Patient taking differently: Take 60 mg by mouth daily. ) 90  capsule 2  . fluticasone (FLONASE) 50 MCG/ACT nasal spray Place 2 sprays into both nostrils as needed for allergies.     Marland Kitchen L-Lysine 500 MG CAPS Take 500 mg by mouth at bedtime.     Elmore Guise Devices (ACCU-CHEK SOFTCLIX) lancets Use to test blood sugar once a day. 100 each 2  . latanoprost (XALATAN) 0.005 % ophthalmic solution Place 1 drop into both eyes at bedtime.    . Melatonin 10 MG TBCR Take 10 mg by mouth at bedtime.     . Multiple Vitamins-Minerals (MULTIVITAMIN ADULT PO) Take 1 tablet by mouth daily.     . Omega-3 Fatty Acids (FISH OIL) 1000 MG CAPS Take 1,000 mg by mouth at bedtime.     Marland Kitchen PRESCRIPTION MEDICATION Take 1 capsule by mouth 2 (two) times daily. New remedy vitamin mixture for neuropathy    . ranitidine (ZANTAC) 300 MG tablet TAKE ONE TABLET BY MOUTH EVERY NIGHT AT BEDTIME (Patient taking differently: Take 300 mg by mouth at bedtime. ) 90 tablet 3  . triamcinolone (KENALOG) 0.025 % cream Apply 1 application topically 2 (two) times daily as needed. 30 g 1  . VITAMIN E PO Take 1 tablet by mouth at bedtime.      No current facility-administered medications on file prior to visit.      Past Medical History:  Diagnosis Date  . Chicken pox   . Depression   . Diabetes mellitus without complication ([place])   . GERD (gastroesophageal reflux disease)   . Hyperlipidemia   . Hypertension   . Stroke ([place])   . UTI (urinary tract infection)    No Known Allergies  Social History   Socioeconomic History  . Marital status: Widowed    Spouse name: Not on file  . Number of children: Not on file  . Years of education: Not on file  . Highest education level: Not on file  Occupational History  . Occupation: N/A  Social Needs  . Financial resource strain: Not on file  . Food insecurity:    Worry: Not on file    Inability: Not on file  . Transportation needs:    Medical: Not on file    Non-medical: Not on file  Tobacco Use  . Smoking status: Never Smoker  . Smokeless tobacco:  Never Used  Substance and Sexual Activity  . Alcohol use: Yes    Comment: occassional wine  . Drug use: No  . Sexual activity: Not Currently  Lifestyle  . Physical activity:    Days per week: Not on file    Minutes per session: Not on file  . Stress: Not on file  Relationships  . Social connections:    Talks on phone: Not on file    Gets together: Not on file    Attends religious service: Not on file    Active member of club or organization: Not on file    Attends meetings of clubs or  organizations: Not on file    Relationship status: Not on file  Other Topics Concern  . Not on file  Social History Narrative   Pt lives in single story home with her daughter   Has 2 children   Some college education   Retired Engineer, production for [employer]:   02/06/18 1528  BP: 124/83  Pulse: 99  Resp: 12  Temp: 98 F (36.7 C)  SpO2: 97%   Body mass index is 24.09 kg/m.   Physical Exam  Nursing note and vitals reviewed. Constitutional: She is oriented to person, place, and time. She appears well-developed and well-nourished. No distress.  HENT:  Head: Normocephalic and atraumatic.    Mouth/Throat: Oropharynx is clear and moist and mucous membranes are normal.  Eyes: Pupils are equal, round, and reactive to light. Conjunctivae are normal.  Cardiovascular: Regular rhythm.  No murmur heard. Pulses:      Dorsalis pedis pulses are 2+ on the right side, and 2+ on the left side.  Respiratory: Effort normal and breath sounds normal. No respiratory distress.  Musculoskeletal: She exhibits no edema.  Lymphadenopathy:    She has no cervical adenopathy.  Neurological: She is alert and oriented to person, place, and time. She has normal strength. No cranial nerve deficit. Gait abnormal.  Skin: Skin is warm. Ecchymosis and laceration noted. No rash noted. No erythema.  Left parietal linear wound surrounded by dry blood,no edema or erythema, with 3 staples. Right  frontal linear laceration with some visible sutures.  Left upper eye lid ecchymosis. Left preauricular area and frontal with pigmentation changes, yellowish (ecchymosis stage).  Psychiatric: Her mood appears anxious.  Well groomed, good eye contact.    ASSESSMENT AND PLAN:  Ms. Kimra was seen today for er follow-up.   Orders Placed This Encounter  Procedures  . Potassium    Hypokalemia K+ containing diet recommended for now. Further recommendations will be given according to K+ results.  -     Potassium  Laceration of skin of forehead, subsequent encounter Stable removed. Wounds healing well. Instructed to keep wounds clean with soap and water. Monitor signs os infection.  Fall, accidental, subsequent encounter Fall precautions discussed. She is not interested in PT,has done it before.  Healthcare maintenance Up to date in vaccinations. Foot care current. DEXA done 10/2016.   Systolic hypertension with cerebrovascular disease Here in the office BP adequate. Recommend monitoring BP at home. For now mild differences in BP between UE's is not worrisome. No changes in current management. Keep f/u appt.  Unstable gait Aggravated by peripheral neuropathy and OA. Recommend always using cane, fall prevention.      Alexa G. Martinique, MD  [facility]. [place] office.

## 2018-02-07 ENCOUNTER — Encounter: Payer: Self-pay | Admitting: Family Medicine

## 2018-02-07 LAB — POTASSIUM: POTASSIUM: 4.5 meq/L (ref 3.5–5.1)

## 2018-02-10 ENCOUNTER — Encounter: Payer: Self-pay | Admitting: Family Medicine

## 2018-02-12 ENCOUNTER — Ambulatory Visit (INDEPENDENT_AMBULATORY_CARE_PROVIDER_SITE_OTHER): Payer: Medicare Other | Admitting: Psychology

## 2018-02-12 DIAGNOSIS — F331 Major depressive disorder, recurrent, moderate: Secondary | ICD-10-CM | POA: Diagnosis not present

## 2018-02-18 ENCOUNTER — Other Ambulatory Visit: Payer: Self-pay | Admitting: Family Medicine

## 2018-02-26 ENCOUNTER — Ambulatory Visit (INDEPENDENT_AMBULATORY_CARE_PROVIDER_SITE_OTHER): Payer: Medicare Other | Admitting: Psychology

## 2018-02-26 DIAGNOSIS — F331 Major depressive disorder, recurrent, moderate: Secondary | ICD-10-CM

## 2018-03-30 ENCOUNTER — Other Ambulatory Visit: Payer: Self-pay | Admitting: Family Medicine

## 2018-03-30 DIAGNOSIS — M109 Gout, unspecified: Secondary | ICD-10-CM

## 2018-04-17 ENCOUNTER — Ambulatory Visit (INDEPENDENT_AMBULATORY_CARE_PROVIDER_SITE_OTHER): Payer: Medicare Other | Admitting: Psychology

## 2018-04-17 DIAGNOSIS — F331 Major depressive disorder, recurrent, moderate: Secondary | ICD-10-CM | POA: Diagnosis not present

## 2018-04-28 DIAGNOSIS — R32 Unspecified urinary incontinence: Secondary | ICD-10-CM | POA: Insufficient documentation

## 2018-04-30 ENCOUNTER — Ambulatory Visit (INDEPENDENT_AMBULATORY_CARE_PROVIDER_SITE_OTHER): Payer: Medicare Other | Admitting: Psychology

## 2018-04-30 DIAGNOSIS — F331 Major depressive disorder, recurrent, moderate: Secondary | ICD-10-CM | POA: Diagnosis not present

## 2018-05-05 ENCOUNTER — Other Ambulatory Visit: Payer: Self-pay | Admitting: Family Medicine

## 2018-05-05 DIAGNOSIS — M109 Gout, unspecified: Secondary | ICD-10-CM

## 2018-05-10 ENCOUNTER — Other Ambulatory Visit: Payer: Self-pay | Admitting: Family Medicine

## 2018-05-14 ENCOUNTER — Ambulatory Visit (INDEPENDENT_AMBULATORY_CARE_PROVIDER_SITE_OTHER): Payer: Medicare Other | Admitting: Psychology

## 2018-05-14 DIAGNOSIS — F331 Major depressive disorder, recurrent, moderate: Secondary | ICD-10-CM

## 2018-05-25 ENCOUNTER — Other Ambulatory Visit: Payer: Self-pay | Admitting: Family Medicine

## 2018-05-25 DIAGNOSIS — M109 Gout, unspecified: Secondary | ICD-10-CM

## 2018-05-28 ENCOUNTER — Ambulatory Visit (INDEPENDENT_AMBULATORY_CARE_PROVIDER_SITE_OTHER): Payer: Medicare Other | Admitting: Psychology

## 2018-05-28 DIAGNOSIS — F331 Major depressive disorder, recurrent, moderate: Secondary | ICD-10-CM

## 2018-06-13 ENCOUNTER — Other Ambulatory Visit: Payer: Self-pay | Admitting: Family Medicine

## 2018-06-13 DIAGNOSIS — M109 Gout, unspecified: Secondary | ICD-10-CM

## 2018-06-14 ENCOUNTER — Ambulatory Visit: Payer: Medicare Other | Admitting: Psychology

## 2018-06-20 ENCOUNTER — Ambulatory Visit: Payer: Self-pay | Admitting: Neurology

## 2018-06-25 ENCOUNTER — Ambulatory Visit: Payer: Medicare Other | Admitting: Psychology

## 2018-06-25 ENCOUNTER — Encounter: Payer: Self-pay | Admitting: Family Medicine

## 2018-06-26 ENCOUNTER — Other Ambulatory Visit: Payer: Self-pay | Admitting: *Deleted

## 2018-06-26 ENCOUNTER — Ambulatory Visit (INDEPENDENT_AMBULATORY_CARE_PROVIDER_SITE_OTHER): Payer: Medicare Other | Admitting: Family Medicine

## 2018-06-26 ENCOUNTER — Telehealth: Payer: Self-pay | Admitting: *Deleted

## 2018-06-26 ENCOUNTER — Other Ambulatory Visit: Payer: Self-pay

## 2018-06-26 ENCOUNTER — Encounter: Payer: Self-pay | Admitting: Family Medicine

## 2018-06-26 VITALS — BP 140/80 | HR 61 | Resp 12

## 2018-06-26 DIAGNOSIS — L299 Pruritus, unspecified: Secondary | ICD-10-CM

## 2018-06-26 DIAGNOSIS — G629 Polyneuropathy, unspecified: Secondary | ICD-10-CM | POA: Diagnosis not present

## 2018-06-26 DIAGNOSIS — K219 Gastro-esophageal reflux disease without esophagitis: Secondary | ICD-10-CM

## 2018-06-26 DIAGNOSIS — M109 Gout, unspecified: Secondary | ICD-10-CM | POA: Diagnosis not present

## 2018-06-26 DIAGNOSIS — I674 Hypertensive encephalopathy: Secondary | ICD-10-CM

## 2018-06-26 DIAGNOSIS — F331 Major depressive disorder, recurrent, moderate: Secondary | ICD-10-CM

## 2018-06-26 DIAGNOSIS — E1149 Type 2 diabetes mellitus with other diabetic neurological complication: Secondary | ICD-10-CM

## 2018-06-26 DIAGNOSIS — M159 Polyosteoarthritis, unspecified: Secondary | ICD-10-CM | POA: Insufficient documentation

## 2018-06-26 MED ORDER — BUPROPION HCL 75 MG PO TABS: 75.0000 mg | ORAL_TABLET | Freq: Two times a day (BID) | ORAL | 2 refills | Status: DC

## 2018-06-26 MED ORDER — DICLOFENAC SODIUM 1 % TD GEL: 4.0000 g | Freq: Four times a day (QID) | TRANSDERMAL | 3 refills | Status: DC

## 2018-06-26 MED ORDER — TRIAMCINOLONE 0.1 % CREAM:EUCERIN CREAM 1:1: 1.0000 "application " | TOPICAL_CREAM | Freq: Two times a day (BID) | CUTANEOUS | 1 refills | Status: DC | PRN

## 2018-06-26 MED ORDER — PANTOPRAZOLE SODIUM 40 MG PO TBEC: 40.0000 mg | DELAYED_RELEASE_TABLET | Freq: Every day | ORAL | 3 refills | Status: DC

## 2018-06-26 NOTE — Assessment & Plan Note (Addendum)
For now we will not resume ranitidine.  We discussed the reason ranitidine was recalled, possible contamination.Explained we have had same issues with other medication.  She agrees with starting Protonix 40 mg 30 minutes before breakfast. Side effects discussed.  GERD precautions discussed and recommended. Follow-up in 4 to 6 weeks.

## 2018-06-26 NOTE — Assessment & Plan Note (Signed)
She will resume Wellbutrin, recommend 75 mg twice daily. No changes in Cymbalta. Instructed about warning signs. Follow-up in 4 to 6 weeks, before if needed.

## 2018-06-26 NOTE — Assessment & Plan Note (Signed)
Joint pain she is describing seems to be more OA related. For now I recommend no changes in allopurinol or colchicine. We discussed some side effects of medications.

## 2018-06-26 NOTE — Progress Notes (Addendum)
Virtual Visit via Video Note  I connected with Alexa Brown on 06/26/18 at  9:00 AM EDT by a video enabled telemedicine application and verified that I am speaking with the correct person using two identifiers.  Location patient: home Location provider:work or home office Persons participating in the virtual visit: patient, provider  I discussed the limitations of evaluation and management by telemedicine and the availability of in person appointments. The patient expressed understanding and agreed to proceed.   HPI: Alexa Alexa Brown is being seen via Webex to address some concerns and to follow-up on some chronic medical problems. Her daughter is with her, who is also helping with camera. She has history of GERD, since she started ranitidine 300 mg she has not had heartburn or acid reflux, still having a "knot" sensation intermittently in the middle of her chest. She did not think Ranitidine helped with this.  She denies dysphagia,odynophagia,or stridor. Negative for cough or wheezing.  She discontinue ranitidine about 3 days ago because she was concerned about medication causing cancer. In the past she took Protonix.  She is also concerned about gout exacerbation. For the past 2 weeks she has had achy-like pain of ankles and feet.   + Stiffness. She has not noted joint edema or erythema, no local heat. No history of trauma. History of unstable gait and back pain, she uses a cane at home. No recent falls. For gout she is currently on allopurinol 100 mg daily and colchicine 0.6 mg daily as needed.   Skin pruritus on her lower extremities. Problem has been going on for about 2 weeks. She denies new medications, detergents, soap, lotion, outdoor exposure, or insect bite. She has not noted rash or color changes on the skin. She has signed topical Benadryl, which is not helping.  She has not identified exacerbating or alleviating factors. Negative for lower extremity edema or  erythema. Burning/numb sensation stable.  Her daughter is asking if Wellbutrin can be resumed.  History of depression, aggravated after the death of her husband. A few months ago Wellbutrin was discontinued, she did not feel it was helping. For the past few days her daughter has noted frequent crying. Currently she is on Cymbalta 60 mg daily. She denies suicidal thoughts.  DM II BS's "little high." BS's 160's. She has not had any >= 200.  She is on nonpharmacologic treatment. History of peripheral neuropathy, still having feet numbness and occasionally burning. She is currently on Cymbalta 60 mg daily.  Denies polydipsia,polyuria, or polyphagia.   Lab Results  Component Value Date   HGBA1C 6.0 01/22/2018    Hypertension, currently she is on benazepril 20 mg daily. She is checking BP regularly, 140s/70-80s. She denies headache, visual changes, chest pain, dyspnea, or edema. CKD III, she has not noted gross hematuria, foamy urine, or decreased urine output.  Lab Results  Component Value Date   CREATININE 0.98 01/30/2018   BUN 20 01/30/2018   NA 141 01/30/2018   K 4.5 02/06/2018   CL 110 01/30/2018   CO2 24 01/30/2018    ROS: See pertinent positives and negatives per HPI.  Past Medical History:  Diagnosis Date  . Chicken pox   . Depression   . Diabetes mellitus without complication ([place])   . GERD (gastroesophageal reflux disease)   . Hyperlipidemia   . Hypertension   . Stroke ([place])   . UTI (urinary tract infection)     Past Surgical History:  Procedure Laterality Date  . ABDOMINAL HYSTERECTOMY    .  APPENDECTOMY    . BREAST BIOPSY Left 2014  . BREAST SURGERY     biopsy    Family History  Problem Relation Age of Onset  . Hyperlipidemia Mother   . Heart disease Mother   . Stroke Mother   . Hypertension Mother   . Hyperlipidemia Father   . Heart disease Father   . Stroke Father   . Hypertension Father   . Asthma Father   . Parkinson's disease  Sister     Social History   Socioeconomic History  . Marital status: Widowed    Spouse name: Not on file  . Number of children: Not on file  . Years of education: Not on file  . Highest education level: Not on file  Occupational History  . Occupation: N/A  Social Needs  . Financial resource strain: Not on file  . Food insecurity:    Worry: Not on file    Inability: Not on file  . Transportation needs:    Medical: Not on file    Non-medical: Not on file  Tobacco Use  . Smoking status: Never Smoker  . Smokeless tobacco: Never Used  Substance and Sexual Activity  . Alcohol use: Yes    Comment: occassional wine  . Drug use: No  . Sexual activity: Not Currently  Lifestyle  . Physical activity:    Days per week: Not on file    Minutes per session: Not on file  . Stress: Not on file  Relationships  . Social connections:    Talks on phone: Not on file    Gets together: Not on file    Attends religious service: Not on file    Active member of club or organization: Not on file    Attends meetings of clubs or organizations: Not on file    Relationship status: Not on file  . Intimate partner violence:    Fear of current or ex partner: Not on file    Emotionally abused: Not on file    Physically abused: Not on file    Forced sexual activity: Not on file  Other Topics Concern  . Not on file  Social History Narrative   Pt lives in single story home with her daughter   Has 2 children   Some college education   Retired Engineer, production for [employer]      Current Outpatient Medications:  .  ACCU-CHEK AVIVA PLUS test strip, USE TO TEST BLOOD SUGAR ONCE DAILY, Disp: 100 each, Rfl: 3 .  ACCU-CHEK SOFTCLIX LANCETS lancets, Use to check blood sugar once daily, Disp: 100 each, Rfl: 2 .  allopurinol (ZYLOPRIM) 100 MG tablet, Take 1 tablet (100 mg total) by mouth daily., Disp: 90 tablet, Rfl: 0 .  atorvastatin (LIPITOR) 40 MG tablet, Take 1 tablet (40 mg total) by mouth  daily., Disp: 90 tablet, Rfl: 2 .  benazepril (LOTENSIN) 20 MG tablet, TAKE ONE TABLET BY MOUTH DAILY (Patient taking differently: Take 20 mg by mouth daily. ), Disp: 90 tablet, Rfl: 0 .  buPROPion (WELLBUTRIN) 75 MG tablet, Take 1 tablet (75 mg total) by mouth 2 (two) times daily., Disp: 60 tablet, Rfl: 2 .  calcium carbonate (OSCAL) 1500 (600 Ca) MG TABS tablet, Take 600 mg of elemental calcium by mouth at bedtime. , Disp: , Rfl:  .  Cholecalciferol (VITAMIN D3) 5000 units CAPS, Take 5,000 Units by mouth at bedtime. , Disp: , Rfl:  .  clopidogrel (PLAVIX) 75 MG tablet, TAKE ONE TABLET  BY MOUTH DAILY (Patient taking differently: 75 mg daily. ), Disp: 90 tablet, Rfl: 3 .  colchicine 0.6 MG tablet, TAKE ONE TABLET BY MOUTH TWICE A DAY, Disp: 30 tablet, Rfl: 0 .  diclofenac sodium (VOLTAREN) 1 % GEL, Apply 4 g topically 4 (four) times daily., Disp: 4 Tube, Rfl: 3 .  DULoxetine (CYMBALTA) 60 MG capsule, Take 1 capsule (60 mg total) by mouth daily., Disp: 90 capsule, Rfl: 1 .  fluticasone (FLONASE) 50 MCG/ACT nasal spray, Place 2 sprays into both nostrils as needed for allergies. , Disp: , Rfl:  .  L-Lysine 500 MG CAPS, Take 500 mg by mouth at bedtime. , Disp: , Rfl:  .  Lancet Devices (ACCU-CHEK SOFTCLIX) lancets, Use to test blood sugar once a day., Disp: 100 each, Rfl: 2 .  latanoprost (XALATAN) 0.005 % ophthalmic solution, Place 1 drop into both eyes at bedtime., Disp: , Rfl:  .  Melatonin 10 MG TBCR, Take 10 mg by mouth at bedtime. , Disp: , Rfl:  .  Multiple Vitamins-Minerals (MULTIVITAMIN ADULT PO), Take 1 tablet by mouth daily. , Disp: , Rfl:  .  Omega-3 Fatty Acids (FISH OIL) 1000 MG CAPS, Take 1,000 mg by mouth at bedtime. , Disp: , Rfl:  .  pantoprazole (PROTONIX) 40 MG tablet, Take 1 tablet (40 mg total) by mouth daily., Disp: 30 tablet, Rfl: 3 .  PRESCRIPTION MEDICATION, Take 1 capsule by mouth 2 (two) times daily. New remedy vitamin mixture for neuropathy, Disp: , Rfl:  .  ranitidine  (ZANTAC) 300 MG tablet, TAKE ONE TABLET BY MOUTH EVERY NIGHT AT BEDTIME (Patient taking differently: Take 300 mg by mouth at bedtime. ), Disp: 90 tablet, Rfl: 3 .  triamcinolone (KENALOG) 0.025 % cream, Apply 1 application topically 2 (two) times daily as needed., Disp: 30 g, Rfl: 1 .  Triamcinolone Acetonide (TRIAMCINOLONE 0.1 % CREAM : EUCERIN) CREA, Apply 1 application topically 2 (two) times daily as needed for rash or itching., Disp: 500 each, Rfl: 1 .  VITAMIN E PO, Take 1 tablet by mouth at bedtime. , Disp: , Rfl:   EXAM:  VITALS per patient if applicable:BP 865/78   Pulse 61   Resp 12   GENERAL: alert, oriented, appears well and in no acute distress  HEENT: atraumatic, conjunctiva clear, no obvious abnormalities on inspection of face.  NECK: normal movements of the head and neck  LUNGS: on inspection no signs of respiratory distress, breathing rate appears normal, no obvious gross SOB, gasping or wheezing  CV: no obvious cyanosis  Alexa: moves all visible extremities without noticeable abnormality. No signs of synovitis.  Skin: LE's with no rash or erythema appreciated. Some SK like lesions (reports as unchanged).  PSYCH/NEURO: pleasant and cooperative, no obvious depression,+anxious. Speech and thought processing grossly intact  ASSESSMENT AND PLAN:  Discussed the following assessment and plan:   Skin pruritus We discussed possible etiologies. I do not appreciate jaundice,rash,or erythema. ? Dryness. ? Vein disease. Recommend topical moisturizer +steroid to apply daily as needed. Good skin care.   DM (diabetes mellitus), type 2 with neurological complications ([place]) ION6E has been at goal. Continue non pharmacologic treatment. Regular exercise as tolerated and healthy diet with avoidance of added sugar food intake is an important part of treatment and recommended. Annual eye exam, periodic dental and foot care recommended.   Systolic hypertension with  cerebrovascular disease Otherwise adequately controlled. I would like BP's < 140/90. No changes in current management. Continue monitoring BP at home. Continue  low-salt diet.  Peripheral neuropathy Problem is stable. Good Food/skin care, trauma prevention and adequate moisturization. No changes in Cymbalta 60 mg daily.  Gout, arthropathy Joint pain she is describing seems to be more OA related. For now I recommend no changes in allopurinol or colchicine. We discussed some side effects of medications.   GERD (gastroesophageal reflux disease) For now we will not resume ranitidine.  We discussed the reason ranitidine was recalled, possible contamination.Explained we have had same issues with other medication.  She agrees with starting Protonix 40 mg 30 minutes before breakfast. Side effects discussed.  GERD precautions discussed and recommended. Follow-up in 4 to 6 weeks.  Depression, major, recurrent ([place]) She will resume Wellbutrin, recommend 75 mg twice daily. No changes in Cymbalta. Instructed about warning signs. Follow-up in 4 to 6 weeks, before if needed.  Generalized osteoarthritis of multiple sites Hx and inspection through WebEx camera due not suggest acute gout flare up. Recommend Tylenol 500 mg up to 4 times per day and topical Voltaren 4 times per day as needed. Fall precautions. Continue Cymbalta 60 mg daily.  We will plan on having lab work next [place].   I discussed the assessment and treatment plan with the patient. The patient was provided an opportunity to ask questions and all were answered. The patient agreed with the plan and demonstrated an understanding of the instructions.   The patient was advised to call back or seek an in-person evaluation if the symptoms worsen or if the condition fails to improve as anticipated.   Return in about 6 weeks (around 08/07/2018) for gerd,depression,and labs.   Deloros Martinique, MD

## 2018-06-26 NOTE — Assessment & Plan Note (Signed)
Hx and inspection through WebEx camera due not suggest acute gout flare up. Recommend Tylenol 500 mg up to 4 times per day and topical Voltaren 4 times per day as needed. Fall precautions. Continue Cymbalta 60 mg daily.

## 2018-06-26 NOTE — Assessment & Plan Note (Signed)
HgA1C has been at goal. Continue non pharmacologic treatment. Regular exercise as tolerated and healthy diet with avoidance of added sugar food intake is an important part of treatment and recommended. Annual eye exam, periodic dental and foot care recommended.

## 2018-06-26 NOTE — Assessment & Plan Note (Signed)
Otherwise adequately controlled. I would like BP's < 140/90. No changes in current management. Continue monitoring BP at home. Continue low-salt diet.

## 2018-06-26 NOTE — Assessment & Plan Note (Signed)
Problem is stable. Good Food/skin care, trauma prevention and adequate moisturization. No changes in Cymbalta 60 mg daily.

## 2018-06-26 NOTE — Telephone Encounter (Signed)
Pharmacy can no longer compound medications. Please sent Triamcinolone Acetonide to [facility] per patient.

## 2018-06-26 NOTE — Telephone Encounter (Signed)
Rx re-sent. Thanks, BJ 

## 2018-06-26 NOTE — Addendum Note (Signed)
Addended by: Martinique, Inaya G on: 06/26/2018 06:03 PM   Modules accepted: Orders

## 2018-06-27 ENCOUNTER — Encounter: Payer: Self-pay | Admitting: Family Medicine

## 2018-06-29 ENCOUNTER — Ambulatory Visit: Payer: Self-pay | Admitting: Neurology

## 2018-07-04 ENCOUNTER — Encounter: Payer: Self-pay | Admitting: Family Medicine

## 2018-07-08 ENCOUNTER — Other Ambulatory Visit: Payer: Self-pay | Admitting: Family Medicine

## 2018-07-08 DIAGNOSIS — M109 Gout, unspecified: Secondary | ICD-10-CM

## 2018-08-04 ENCOUNTER — Other Ambulatory Visit: Payer: Self-pay | Admitting: Family Medicine

## 2018-08-04 DIAGNOSIS — M109 Gout, unspecified: Secondary | ICD-10-CM

## 2018-08-08 ENCOUNTER — Other Ambulatory Visit: Payer: Self-pay | Admitting: Family Medicine

## 2018-08-25 ENCOUNTER — Other Ambulatory Visit: Payer: Self-pay | Admitting: Family Medicine

## 2018-08-26 ENCOUNTER — Other Ambulatory Visit: Payer: Self-pay | Admitting: Family Medicine

## 2018-08-26 DIAGNOSIS — M109 Gout, unspecified: Secondary | ICD-10-CM

## 2018-08-31 ENCOUNTER — Encounter: Payer: Self-pay | Admitting: Family Medicine

## 2018-08-31 ENCOUNTER — Other Ambulatory Visit: Payer: Self-pay

## 2018-08-31 ENCOUNTER — Ambulatory Visit (INDEPENDENT_AMBULATORY_CARE_PROVIDER_SITE_OTHER): Payer: Medicare Other | Admitting: Family Medicine

## 2018-08-31 DIAGNOSIS — K120 Recurrent oral aphthae: Secondary | ICD-10-CM

## 2018-08-31 NOTE — Progress Notes (Signed)
   Subjective:    Patient ID: Alexa Brown, female    DOB: 07-18-1940, 78 y.o.   MRN: [id number]  HPI Virtual Visit via Telephone Note  I connected with the patient on 08/31/18 at  3:00 PM EDT by telephone and verified that I am speaking with the correct person using two identifiers. We attempted to connect virtually but we had technical difficulties with the audio and video.     I discussed the limitations, risks, security and privacy concerns of performing an evaluation and management service by telephone and the availability of in person appointments. I also discussed with the patient that there may be a patient responsible charge related to this service. The patient expressed understanding and agreed to proceed.  Location patient: home Location provider: work or home office Participants present for the call: patient, provider Patient did not have a visit in the prior 7 days to address this/these issue(s).   History of Present Illness: Here for one week of a painful ulcer on the inside of the bottom lip. No URI symptoms, no ST. No fever. She has never had this before.    Observations/Objective: Patient sounds cheerful and well on the phone. I do not appreciate any SOB. Speech and thought processing are grossly intact. Patient reported vitals:  Assessment and Plan: Aphthous ulcer. She can use Anbesol for temporary numbing during meals. I explained these are self-limited and will go away on their own. Recheck prn.  Alysia Penna, MD   Follow Up Instructions:     [phone] 11-20 [phone] 21-30 I did not refer this patient for an OV in the next 24 hours for this/these issue(s).  I discussed the assessment and treatment plan with the patient. The patient was provided an opportunity to ask questions and all were answered. The patient agreed with the plan and demonstrated an understanding of the instructions.   The patient was advised to call back or seek an  in-person evaluation if the symptoms worsen or if the condition fails to improve as anticipated.  I provided 10 minutes of non-face-to-face time during this encounter.   Alysia Penna, MD    Review of Systems     Objective:   Physical Exam        Assessment & Plan:

## 2018-09-09 ENCOUNTER — Other Ambulatory Visit: Payer: Self-pay | Admitting: Family Medicine

## 2018-09-09 DIAGNOSIS — M109 Gout, unspecified: Secondary | ICD-10-CM

## 2018-09-21 ENCOUNTER — Other Ambulatory Visit: Payer: Self-pay | Admitting: Family Medicine

## 2018-10-01 ENCOUNTER — Other Ambulatory Visit: Payer: Self-pay | Admitting: Family Medicine

## 2018-10-01 DIAGNOSIS — M109 Gout, unspecified: Secondary | ICD-10-CM

## 2018-10-02 ENCOUNTER — Ambulatory Visit: Payer: Medicare Other

## 2018-10-08 DIAGNOSIS — H25013 Cortical age-related cataract, bilateral: Secondary | ICD-10-CM | POA: Diagnosis not present

## 2018-10-08 DIAGNOSIS — H40033 Anatomical narrow angle, bilateral: Secondary | ICD-10-CM | POA: Diagnosis not present

## 2018-10-08 DIAGNOSIS — H2513 Age-related nuclear cataract, bilateral: Secondary | ICD-10-CM | POA: Diagnosis not present

## 2018-10-09 ENCOUNTER — Other Ambulatory Visit: Payer: Self-pay | Admitting: Family Medicine

## 2018-10-17 DIAGNOSIS — H2513 Age-related nuclear cataract, bilateral: Secondary | ICD-10-CM | POA: Diagnosis not present

## 2018-10-17 DIAGNOSIS — E119 Type 2 diabetes mellitus without complications: Secondary | ICD-10-CM | POA: Diagnosis not present

## 2018-10-24 ENCOUNTER — Other Ambulatory Visit: Payer: Self-pay | Admitting: Family Medicine

## 2018-10-25 ENCOUNTER — Other Ambulatory Visit: Payer: Self-pay | Admitting: Family Medicine

## 2018-10-30 ENCOUNTER — Ambulatory Visit (INDEPENDENT_AMBULATORY_CARE_PROVIDER_SITE_OTHER): Payer: Medicare Other | Admitting: Family Medicine

## 2018-10-30 ENCOUNTER — Other Ambulatory Visit: Payer: Self-pay

## 2018-10-30 ENCOUNTER — Other Ambulatory Visit: Payer: Self-pay | Admitting: Family Medicine

## 2018-10-30 DIAGNOSIS — E785 Hyperlipidemia, unspecified: Secondary | ICD-10-CM

## 2018-10-30 DIAGNOSIS — E1149 Type 2 diabetes mellitus with other diabetic neurological complication: Secondary | ICD-10-CM

## 2018-10-30 DIAGNOSIS — G629 Polyneuropathy, unspecified: Secondary | ICD-10-CM | POA: Diagnosis not present

## 2018-10-30 DIAGNOSIS — K219 Gastro-esophageal reflux disease without esophagitis: Secondary | ICD-10-CM | POA: Diagnosis not present

## 2018-10-30 DIAGNOSIS — M109 Gout, unspecified: Secondary | ICD-10-CM

## 2018-10-30 DIAGNOSIS — R2681 Unsteadiness on feet: Secondary | ICD-10-CM

## 2018-10-30 DIAGNOSIS — F331 Major depressive disorder, recurrent, moderate: Secondary | ICD-10-CM

## 2018-10-30 NOTE — Progress Notes (Signed)
Virtual Visit via Telephone Note  I connected with Alexa Brown on 11/01/18 at  2:00 PM EDT by telephone and verified that I am speaking with the correct person using two identifiers.   I discussed the limitations, risks, security and privacy concerns of performing an evaluation and management service by telephone and the availability of in person appointments. I also discussed with the patient that there may be a patient responsible charge related to this service. The patient expressed understanding and agreed to proceed.  Location patient: home Location provider: work or home office Participants present for the call: patient, provider Patient did not have a visit in the prior 7 days to address this/these issue(s).   History of Present Illness:  Alexa Brown is a 78 yo female with Hx of peripheral neuropathy,DM II,depression,anxiety,chronic back pain who wants to review all her meds and OTC supplements she is taking. She is with her daughter.  Last visit 06/26/18. She is c/o LE weakness,unstable gait. She has had problem for years. PT has helped some but problem does not resolve.  Being forgetful, "not terrible." Forgets where she is going when daughter is driving her somewhere. Negative for confusion.  She has Hx of depression and anxiety. We have been adjusted medications. She is on Cymbalta 60 mg daily and Wellbutrin 75 mg bid. Denies suicidal thoughts. She is following with psychotherapist, Dr Marlowe Sax.  She has to wear depends because urinary incontinence,"a little." 4-5 episodes per day.  Denies dysuria,increased urinary frequency, gross hematuria,or decreased urine output.  -She wants to stop Atorvastatin 40 mg. She is not sure why she is taking med.  She has not been consistent with a low fa diet.  It seems to be causing muscle aches.  Lab Results  Component Value Date   CHOL 167 01/22/2018   HDL 70.00 01/22/2018   LDLCALC 70 01/22/2018   TRIG 134.0  01/22/2018   CHOLHDL 2 01/22/2018   DM II,she is on non pharmacologic treatment. Concerned about BS's: 120's-130's,occasionally 443'X if she eats certain foods. Denies abdominal pain, nausea,vomiting, polydipsia,polyuria, or polyphagia.  Gout,she has not had an attack in years. She is on Allopurinol 100 mg daily dn Colchicine 0.6 mg as needed.  She has not taken Protonix for 2 months. She is not sure if she needs to resume. Heartburn x 1.  Denies abdominal pain, nausea, vomiting, changes in bowel habits, blood in stool or melena.  -Taking some OTC supplements: Blood buster,aple cider vineger+honey,neuremedy. She wants to now if they can be taken with her other meds.   Observations/Objective: Patient sounds cheerful and well on the phone. I do not appreciate any SOB. Speech and thought processing are grossly intact.+ Anxiety. Patient reported vitals:N/A  Assessment and Plan: 1. Gastroesophageal reflux disease, esophagitis presence not specified Well controlled. GERD precautions discussed. Continue Nexium 20 mg daily as needed. We discussed some side effects of PPI's.  2. DM (diabetes mellitus), type 2 with neurological complications ([place]) It has been well controlled. Continue non pharmacologic treatment. Continue monitoring BS at home.  Lab Results  Component Value Date   HGBA1C 6.0 01/22/2018    3. Gout, arthropathy Well controlled. No changes in current management.  4. Peripheral polyneuropathy Educated about Dx,prognosis,and treatment options. Recommend using a walker,Rx to be mailed. Fall precautions also discussed.  5. Moderate episode of recurrent major depressive disorder ([place]) She wants to stop meds, her daughter thinks meds are helping. Continue psychotherapy. No changes in current management.  6. Dyslipidemia Benefits of statin  discussed. She agrees with trying Atorvastatin 20 mg instead 40 mg Also recommend low fat diet.    Follow Up  Instructions: She has more concerns, no new problems. After 35 min I explained I have to see other pts,already behind. She agrees with addressing her other concerns next OV.   I did not refer this patient for an OV in the next 24 hours for this/these issue(s).  I discussed the assessment and treatment plan with the patient. She was provided an opportunity to ask questions and all were answered. She agreed with the plan and demonstrated an understanding of the instructions.   The patient was advised to call back or seek an in-person evaluation if the symptoms worsen or if the condition fails to improve as anticipated.  I provided 35 minutes of non-face-to-face time during this encounter.   Trezure Martinique, MD

## 2018-11-01 ENCOUNTER — Encounter: Payer: Self-pay | Admitting: Family Medicine

## 2018-11-05 ENCOUNTER — Other Ambulatory Visit: Payer: Self-pay | Admitting: *Deleted

## 2018-11-08 ENCOUNTER — Encounter: Payer: Self-pay | Admitting: Family Medicine

## 2018-11-23 DIAGNOSIS — G8911 Acute pain due to trauma: Secondary | ICD-10-CM | POA: Diagnosis not present

## 2018-11-23 DIAGNOSIS — I10 Essential (primary) hypertension: Secondary | ICD-10-CM | POA: Diagnosis not present

## 2018-11-23 DIAGNOSIS — S0181XA Laceration without foreign body of other part of head, initial encounter: Secondary | ICD-10-CM | POA: Diagnosis not present

## 2018-11-23 DIAGNOSIS — G44309 Post-traumatic headache, unspecified, not intractable: Secondary | ICD-10-CM | POA: Diagnosis not present

## 2018-11-23 DIAGNOSIS — Z79899 Other long term (current) drug therapy: Secondary | ICD-10-CM | POA: Diagnosis not present

## 2018-11-23 DIAGNOSIS — S0191XA Laceration without foreign body of unspecified part of head, initial encounter: Secondary | ICD-10-CM | POA: Diagnosis not present

## 2018-11-23 DIAGNOSIS — M1990 Unspecified osteoarthritis, unspecified site: Secondary | ICD-10-CM | POA: Diagnosis not present

## 2018-11-25 DIAGNOSIS — Z01812 Encounter for preprocedural laboratory examination: Secondary | ICD-10-CM | POA: Diagnosis not present

## 2018-11-26 ENCOUNTER — Encounter: Payer: Self-pay | Admitting: Family Medicine

## 2018-11-26 NOTE — Telephone Encounter (Signed)
Order for walker mailed to address on file for patient per Dr. Martinique.

## 2018-11-27 DIAGNOSIS — E1143 Type 2 diabetes mellitus with diabetic autonomic (poly)neuropathy: Secondary | ICD-10-CM | POA: Diagnosis not present

## 2018-11-29 DIAGNOSIS — Z7982 Long term (current) use of aspirin: Secondary | ICD-10-CM | POA: Diagnosis not present

## 2018-11-29 DIAGNOSIS — Z79899 Other long term (current) drug therapy: Secondary | ICD-10-CM | POA: Diagnosis not present

## 2018-11-29 DIAGNOSIS — H2511 Age-related nuclear cataract, right eye: Secondary | ICD-10-CM | POA: Diagnosis not present

## 2018-11-29 DIAGNOSIS — Z7902 Long term (current) use of antithrombotics/antiplatelets: Secondary | ICD-10-CM | POA: Diagnosis not present

## 2018-11-29 DIAGNOSIS — E785 Hyperlipidemia, unspecified: Secondary | ICD-10-CM | POA: Diagnosis not present

## 2018-11-29 DIAGNOSIS — H25811 Combined forms of age-related cataract, right eye: Secondary | ICD-10-CM | POA: Diagnosis not present

## 2018-11-29 DIAGNOSIS — K219 Gastro-esophageal reflux disease without esophagitis: Secondary | ICD-10-CM | POA: Diagnosis not present

## 2018-11-29 DIAGNOSIS — H25011 Cortical age-related cataract, right eye: Secondary | ICD-10-CM | POA: Diagnosis not present

## 2018-11-29 DIAGNOSIS — I10 Essential (primary) hypertension: Secondary | ICD-10-CM | POA: Diagnosis not present

## 2018-11-30 ENCOUNTER — Telehealth: Payer: Self-pay | Admitting: Family Medicine

## 2018-11-30 DIAGNOSIS — G629 Polyneuropathy, unspecified: Secondary | ICD-10-CM | POA: Diagnosis not present

## 2018-11-30 DIAGNOSIS — R269 Unspecified abnormalities of gait and mobility: Secondary | ICD-10-CM | POA: Diagnosis not present

## 2018-11-30 NOTE — Telephone Encounter (Signed)
Ms. Alexa Brown called in today very upset and wanted to know why she was receiving a order # and model # of the walker.  Pt wanted to know where this walker was coming from and why she was not notified as to where it was coming from.  Pt was aware that the provider and CMA is not in the office, but she was not satisfied with that answer she wanted to have a clinical person to call her back today with the answer of where it is coming from and when she should be receiving it.  Pt is aware that I will let Noelle Penner know and I will see if she can give her a call back in the early part of the Tuesday.  Pt seemed to be satisfied with me stating I will send a msg back to have [place] call her back.

## 2018-12-04 NOTE — Telephone Encounter (Signed)
Pt was called and received RX for walker in the mail and has taken this to medical supply store. Pt apologized for getting upset earlier.

## 2018-12-14 ENCOUNTER — Other Ambulatory Visit: Payer: Self-pay | Admitting: Family Medicine

## 2018-12-14 DIAGNOSIS — M109 Gout, unspecified: Secondary | ICD-10-CM

## 2018-12-18 ENCOUNTER — Encounter: Payer: Medicare Other | Admitting: Family Medicine

## 2018-12-18 ENCOUNTER — Other Ambulatory Visit (INDEPENDENT_AMBULATORY_CARE_PROVIDER_SITE_OTHER): Payer: Medicare Other

## 2018-12-18 ENCOUNTER — Other Ambulatory Visit: Payer: Self-pay

## 2018-12-18 DIAGNOSIS — E1149 Type 2 diabetes mellitus with other diabetic neurological complication: Secondary | ICD-10-CM

## 2018-12-18 DIAGNOSIS — E785 Hyperlipidemia, unspecified: Secondary | ICD-10-CM | POA: Diagnosis not present

## 2018-12-18 DIAGNOSIS — M109 Gout, unspecified: Secondary | ICD-10-CM | POA: Diagnosis not present

## 2018-12-18 DIAGNOSIS — N183 Chronic kidney disease, stage 3 unspecified: Secondary | ICD-10-CM

## 2018-12-18 LAB — COMPREHENSIVE METABOLIC PANEL
ALT: 17 U/L (ref 0–35)
AST: 22 U/L (ref 0–37)
Albumin: 4.2 g/dL (ref 3.5–5.2)
Alkaline Phosphatase: 60 U/L (ref 39–117)
BUN: 15 mg/dL (ref 6–23)
CO2: 27 mEq/L (ref 19–32)
Calcium: 9.7 mg/dL (ref 8.4–10.5)
Chloride: 106 mEq/L (ref 96–112)
Creatinine, Ser: 0.93 mg/dL (ref 0.40–1.20)
GFR: 58.54 mL/min — ABNORMAL LOW (ref >60.00)
Glucose, Bld: 111 mg/dL — ABNORMAL HIGH (ref 70–99)
Potassium: 3.7 mEq/L (ref 3.5–5.1)
Sodium: 141 mEq/L (ref 135–145)
Total Bilirubin: 0.6 mg/dL (ref 0.2–1.2)
Total Protein: 6.7 g/dL (ref 6.0–8.3)

## 2018-12-18 LAB — CBC
HCT: 41.8 % (ref 36.0–46.0)
Hemoglobin: 14.4 g/dL (ref 12.0–15.0)
MCHC: 34.5 g/dL (ref 30.0–36.0)
MCV: 87.9 fl (ref 78.0–100.0)
Platelets: 230 10*3/uL (ref 150.0–400.0)
RBC: 4.76 Mil/uL (ref 3.87–5.11)
RDW: 13.2 % (ref 11.5–15.5)
WBC: 5.4 10*3/uL (ref 4.0–10.5)

## 2018-12-18 LAB — LIPID PANEL
Cholesterol: 160 mg/dL (ref 0–200)
HDL: 75.7 mg/dL (ref >39.00)
LDL Cholesterol: 68 mg/dL (ref 0–99)
NonHDL: 84.54
Total CHOL/HDL Ratio: 2
Triglycerides: 83 mg/dL (ref 0.0–149.0)
VLDL: 16.6 mg/dL (ref 0.0–40.0)

## 2018-12-18 LAB — URIC ACID: Uric Acid, Serum: 5.7 mg/dL (ref 2.4–7.0)

## 2018-12-18 LAB — HEMOGLOBIN A1C: Hgb A1c MFr Bld: 5.9 % (ref 4.6–6.5)

## 2018-12-18 LAB — TSH: TSH: 2.87 u[IU]/mL (ref 0.35–4.50)

## 2018-12-22 LAB — FRUCTOSAMINE: Fructosamine: 253 umol/L (ref 205–285)

## 2019-01-01 ENCOUNTER — Other Ambulatory Visit: Payer: Self-pay

## 2019-01-01 ENCOUNTER — Encounter: Payer: Self-pay | Admitting: Family Medicine

## 2019-01-01 ENCOUNTER — Ambulatory Visit (INDEPENDENT_AMBULATORY_CARE_PROVIDER_SITE_OTHER): Payer: Medicare Other | Admitting: Family Medicine

## 2019-01-01 VITALS — BP 120/70 | HR 100 | Temp 98.4°F | Resp 12 | Ht 66.0 in | Wt 154.0 lb

## 2019-01-01 DIAGNOSIS — F331 Major depressive disorder, recurrent, moderate: Secondary | ICD-10-CM

## 2019-01-01 DIAGNOSIS — M109 Gout, unspecified: Secondary | ICD-10-CM | POA: Diagnosis not present

## 2019-01-01 DIAGNOSIS — N1831 Chronic kidney disease, stage 3a: Secondary | ICD-10-CM

## 2019-01-01 DIAGNOSIS — G629 Polyneuropathy, unspecified: Secondary | ICD-10-CM

## 2019-01-01 DIAGNOSIS — E1149 Type 2 diabetes mellitus with other diabetic neurological complication: Secondary | ICD-10-CM | POA: Diagnosis not present

## 2019-01-01 DIAGNOSIS — E785 Hyperlipidemia, unspecified: Secondary | ICD-10-CM

## 2019-01-01 DIAGNOSIS — R2681 Unsteadiness on feet: Secondary | ICD-10-CM

## 2019-01-01 DIAGNOSIS — K219 Gastro-esophageal reflux disease without esophagitis: Secondary | ICD-10-CM

## 2019-01-01 LAB — MICROALBUMIN / CREATININE URINE RATIO
Creatinine,U: 125.5 mg/dL
Microalb Creat Ratio: 0.7 mg/g (ref 0.0–30.0)
Microalb, Ur: 0.8 mg/dL (ref 0.0–1.9)

## 2019-01-01 MED ORDER — PANTOPRAZOLE SODIUM 20 MG PO TBEC: 20.0000 mg | DELAYED_RELEASE_TABLET | Freq: Every day | ORAL | 1 refills | Status: DC

## 2019-01-01 NOTE — Progress Notes (Signed)
HPI:   Alexa.Alexa Brown is a 78 y.o. female, who is here today her daughter for chronic disease management.  Last virtual/tel visit on 12/18/18, when several concerns were addressed. She had labs recently.  DM II: Dx'ed in 2016. She is on non pharmacologic treatment. Denies abdominal pain, nausea,vomiting, polydipsia,polyuria, or polyphagia.   Lab Results  Component Value Date   HGBA1C 5.9 12/18/2018   Peripheral neuropathy: Burning,tingling and needle sensation in LE's, it has improved. She is on Cymbalta 60 mg daily.   Lab Results  Component Value Date   CREATININE 0.93 12/18/2018   BUN 15 12/18/2018   NA 141 12/18/2018   K 3.7 12/18/2018   CL 106 12/18/2018   CO2 27 12/18/2018    HTN: She is on Benazepril 20 mg daily S/P CVA in 1994, then she was Dx'ed with HTN. Denies severe/frequent headache, visual changes, chest pain, dyspnea, palpitation, new focal weakness, or edema.  She is on Plavix 75 mg daily.  She has c/o memory problems, discussed in length last visit.  Neurology referral 06/2017 for memory difficulties. She was supposed to re-schedule appt.  Brain MRI done on 10/2017: 1. No specific or reversible explanation for memory loss. 2. Central predominant volume loss that has progressed from 2009.Relatively preserved medial temporal volume. 3. 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  Unstable gait, frequent falls. She has not had falls since her last visit. She uses a walker but not all the time.  GERD: She started having heartburn, mainly at night. She has not identified exacerbating factors. Last visit she reported stopping Protonix because she did not feel she needed.  Negative for nausea, vomiting, changes in bowel habits, or melena.  Depression and anxiety: She is on Cymbalta 60 mg and Bupropion 75 mg bid. She has not followed with therapist,Alexa Brown,in a couple months. She did not feel  comfortable with having virtual visits. Denies suicidal thoughts. + Anxiety and depression.  She is living with her daughter.  Gout: She is on Allopurinol 100 mg daily and Colchicine as needed. She has not had a gout attack in years.  HLD: On Atorvastatin 40 mg daily. Following low fat diet.  Lab Results  Component Value Date   CHOL 160 12/18/2018   HDL 75.70 12/18/2018   LDLCALC 68 12/18/2018   TRIG 83.0 12/18/2018   CHOLHDL 2 12/18/2018    Review of Systems  Constitutional: Negative for activity change, appetite change, fatigue and fever.  HENT: Negative for mouth sores, nosebleeds and trouble swallowing.   Eyes: Negative for redness and visual disturbance.  Respiratory: Negative for cough and wheezing.   Gastrointestinal:       Negative for changes in bowel habits.  Genitourinary: Negative for decreased urine volume, dysuria and hematuria.  Musculoskeletal: Positive for back pain and gait problem. Negative for myalgias.  Skin: Negative for rash and wound.  Neurological: Positive for numbness. Negative for syncope and facial asymmetry.  Psychiatric/Behavioral: Negative for confusion. The patient is nervous/anxious.   Rest see pertinent positives and negatives per HPI.   Current Outpatient Medications on File Prior to Visit  Medication Sig Dispense Refill  . ACCU-CHEK AVIVA PLUS test strip TEST BLOOD SUGAR ONCE DAILY 100 each 2  . ACCU-CHEK SOFTCLIX LANCETS lancets Use to check blood sugar once daily 100 each 2  . allopurinol (ZYLOPRIM) 100 MG tablet Take by mouth.    Marland Kitchen atorvastatin (LIPITOR) 40 MG tablet Take by mouth.    Marland Kitchen  benazepril (LOTENSIN) 20 MG tablet Take by mouth.    Marland Kitchen buPROPion (WELLBUTRIN) 75 MG tablet Take by mouth.    . calcium carbonate (OSCAL) 1500 (600 Ca) MG TABS tablet Take 600 mg of elemental calcium by mouth at bedtime.     . Cholecalciferol (VITAMIN D3) 5000 units CAPS Take 5,000 Units by mouth at bedtime.     . clopidogrel (PLAVIX) 75 MG tablet  Take by mouth.    . colchicine 0.6 MG tablet Take by mouth.    . diclofenac sodium (VOLTAREN) 1 % GEL Place onto the skin.    . DULoxetine (CYMBALTA) 60 MG capsule Take by mouth.    . fluticasone (FLONASE) 50 MCG/ACT nasal spray Place 2 sprays into both nostrils as needed for allergies.     Marland Kitchen FLUZONE HIGH-DOSE QUADRIVALENT 0.7 ML SUSY     . ketorolac (ACULAR) 0.4 % SOLN 1 drop 4 (four) times daily. BEGIN 4 DAYS BEFORE SURG    . L-Lysine 500 MG CAPS Take 500 mg by mouth at bedtime.     Elmore Guise Devices (ACCU-CHEK SOFTCLIX) lancets Use to test blood sugar once a day. 100 each 2  . latanoprost (XALATAN) 0.005 % ophthalmic solution Place 1 drop into both eyes at bedtime.    . Melatonin 10 MG TBCR Take 10 mg by mouth at bedtime.     . moxifloxacin (VIGAMOX) 0.5 % ophthalmic solution Place one drop into the right eye 4 (four) times daily for 7 days. Dispense remainder for home use    . Multiple Vitamin (MULTIVITAMIN) capsule Take by mouth.    . Omega-3 Fatty Acids (FISH OIL) 1000 MG CAPS Take by mouth.    . prednisoLONE acetate (PRED FORTE) 1 % ophthalmic suspension Prednisolone 1% take one drop four times a day in the right eye for three weeks and then take one drop once a day in the right eye for one week then stop    . PRESCRIPTION MEDICATION Take 1 capsule by mouth 2 (two) times daily. New remedy vitamin mixture for neuropathy    . Triamcinolone Acetonide (TRIAMCINOLONE 0.1 % CREAM : EUCERIN) CREA Apply 1 application topically 2 (two) times daily as needed for rash or itching. 500 each 1  . triamcinolone cream (KENALOG) 0.1 % Apply topically.    . vitamin E 400 UNIT capsule Take by mouth.     No current facility-administered medications on file prior to visit.      Past Medical History:  Diagnosis Date  . Chicken pox   . Depression   . Diabetes mellitus without complication ([place])   . GERD (gastroesophageal reflux disease)   . Hyperlipidemia   . Hypertension   . Stroke ([place])   . UTI  (urinary tract infection)    No Known Allergies  Family History  Problem Relation Age of Onset  . Hyperlipidemia Mother   . Heart disease Mother   . Stroke Mother   . Hypertension Mother   . Hyperlipidemia Father   . Heart disease Father   . Stroke Father   . Hypertension Father   . Asthma Father   . Parkinson's disease Sister     Social History   Socioeconomic History  . Marital status: Widowed    Spouse name: Not on file  . Number of children: Not on file  . Years of education: Not on file  . Highest education level: Not on file  Occupational History  . Occupation: N/A  Social Needs  . Emergency planning/management officer  strain: Not on file  . Food insecurity    Worry: Not on file    Inability: Not on file  . Transportation needs    Medical: Not on file    Non-medical: Not on file  Tobacco Use  . Smoking status: Never Smoker  . Smokeless tobacco: Never Used  Substance and Sexual Activity  . Alcohol use: Yes    Comment: occassional wine  . Drug use: No  . Sexual activity: Not Currently  Lifestyle  . Physical activity    Days per week: Not on file    Minutes per session: Not on file  . Stress: Not on file  Relationships  . Social Herbalist on phone: Not on file    Gets together: Not on file    Attends religious service: Not on file    Active member of club or organization: Not on file    Attends meetings of clubs or organizations: Not on file    Relationship status: Not on file  Other Topics Concern  . 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  Physical Exam  Nursing note and vitals reviewed. Constitutional: She is oriented to person, place, and time. She appears well-developed.  No distress.  HENT:  Head: Normocephalic and atraumatic.  Mouth/Throat: Oropharynx is clear and moist and mucous membranes are normal.  Eyes: Pupils are equal, round, and reactive to light. Conjunctivae are normal.  Cardiovascular: Normal rate and regular rhythm.  No murmur heard. Pulses:      Dorsalis pedis pulses are 2+ on the right side and 2+ on the left side.  Respiratory: Effort normal and breath sounds normal. No respiratory distress.  GI: Soft. She exhibits no mass. There is no abdominal tenderness.  Musculoskeletal:        General: No edema.  Lymphadenopathy:    She has no cervical adenopathy.  Neurological: She is alert and oriented to person, place, and time. She has normal strength. No cranial nerve deficit. Gait abnormal.  Skin: Skin is warm. No rash noted. No erythema.  Psychiatric: Her mood appears anxious.  Well groomed, good eye contact.    ASSESSMENT AND PLAN:  Alexa. Alexa Brown was seen today for follow-up.  Diagnoses and all orders for this visit:  DM (diabetes mellitus), type 2 with neurological complications ([place]) [id number] at goal. Continue non pharmacologic treatment. Regular exercise as tolerated and healthy diet with avoidance of added sugar food intake is an important part of treatment and recommended. Annual eye exam, periodic dental and foot care recommended. F/U in 5-6 months  -     Microalbumin / creatinine urine ratio  Peripheral polyneuropathy Appropriate skin care. Continue Cymbalta 60 mg daily.  Stage 3a chronic kidney disease Stable. Low salt diet and adequate hydration, Good BP and glucose controlled. Avoid NSAID's.  Gout, arthropathy Well controlled. No changes in Allopurinol 100 mg daily.  Dyslipidemia Problem is stable. No changes in current management.  Unstable gait Neuropathy and OA/back pain contribute to problem as well as some meds. She has had PT before. Fall precautions. Recommended using her walker all the time.   Moderate episode  of recurrent major depressive disorder ([place]) Stable. Information about mental health providers in the area given,so she can establish with new psychotherapist. No changes in Cymbalta or Wellbutrin.  Gastroesophageal reflux disease, unspecified whether esophagitis present Symptoms re-occurred after stopping Protonix. Resume Protonix but at lower dose, 20 mg daily. GERD precautions discussed.  -     pantoprazole (PROTONIX) 20 MG tablet; Take 1 tablet (20 mg total) by mouth daily.  10:06-10:48 40+ min face to face OV. > 50% was dedicated to discussion of Dx, prognosis, treatment options, and some side effects of medications.  Return in about 6 months (around 07/02/2019) for AWV and f/u.    -Alexa. Alexa Brown was advised to return sooner than planned today if new concerns arise.       Dandrea G. Martinique, MD  [facility]. [place] office.

## 2019-01-01 NOTE — Patient Instructions (Addendum)
A few things to remember from today's visit:   DM (diabetes mellitus), type 2 with neurological complications ([place])  Peripheral polyneuropathy  Stage 3a chronic kidney disease  Gout, arthropathy  Dyslipidemia  Unstable gait  Moderate episode of recurrent major depressive disorder (HCC)  Gastroesophageal reflux disease, unspecified whether esophagitis present  Protonix 20 mg added today, take it for 3 weeks then every other day.  Please be sure medication list is accurate. If a new problem present, please set up appointment sooner than planned today.

## 2019-01-03 ENCOUNTER — Encounter: Payer: Self-pay | Admitting: Family Medicine

## 2019-01-05 ENCOUNTER — Other Ambulatory Visit: Payer: Self-pay | Admitting: Family Medicine

## 2019-01-12 DIAGNOSIS — Z01818 Encounter for other preprocedural examination: Secondary | ICD-10-CM | POA: Diagnosis not present

## 2019-01-17 ENCOUNTER — Other Ambulatory Visit: Payer: Self-pay | Admitting: Family Medicine

## 2019-01-17 DIAGNOSIS — Z1231 Encounter for screening mammogram for malignant neoplasm of breast: Secondary | ICD-10-CM

## 2019-01-21 DIAGNOSIS — H269 Unspecified cataract: Secondary | ICD-10-CM | POA: Diagnosis not present

## 2019-01-21 DIAGNOSIS — H2512 Age-related nuclear cataract, left eye: Secondary | ICD-10-CM | POA: Diagnosis not present

## 2019-01-21 DIAGNOSIS — H40033 Anatomical narrow angle, bilateral: Secondary | ICD-10-CM | POA: Diagnosis not present

## 2019-01-21 DIAGNOSIS — E119 Type 2 diabetes mellitus without complications: Secondary | ICD-10-CM | POA: Diagnosis not present

## 2019-01-22 ENCOUNTER — Other Ambulatory Visit: Payer: Self-pay | Admitting: Family Medicine

## 2019-01-23 ENCOUNTER — Other Ambulatory Visit: Payer: Self-pay | Admitting: Family Medicine

## 2019-01-24 ENCOUNTER — Other Ambulatory Visit: Payer: Self-pay | Admitting: Family Medicine

## 2019-01-30 ENCOUNTER — Other Ambulatory Visit: Payer: Self-pay

## 2019-01-30 ENCOUNTER — Ambulatory Visit (INDEPENDENT_AMBULATORY_CARE_PROVIDER_SITE_OTHER): Payer: Medicare Other | Admitting: Family Medicine

## 2019-01-30 ENCOUNTER — Encounter: Payer: Self-pay | Admitting: Family Medicine

## 2019-01-30 VITALS — BP 140/80 | HR 107 | Temp 97.6°F | Resp 16 | Ht 66.0 in | Wt 153.4 lb

## 2019-01-30 DIAGNOSIS — K219 Gastro-esophageal reflux disease without esophagitis: Secondary | ICD-10-CM | POA: Diagnosis not present

## 2019-01-30 DIAGNOSIS — R159 Full incontinence of feces: Secondary | ICD-10-CM | POA: Diagnosis not present

## 2019-01-30 DIAGNOSIS — F331 Major depressive disorder, recurrent, moderate: Secondary | ICD-10-CM | POA: Diagnosis not present

## 2019-01-30 DIAGNOSIS — R32 Unspecified urinary incontinence: Secondary | ICD-10-CM

## 2019-01-30 DIAGNOSIS — Z0000 Encounter for general adult medical examination without abnormal findings: Secondary | ICD-10-CM | POA: Diagnosis not present

## 2019-01-30 NOTE — Patient Instructions (Addendum)
  Alexa Brown , Thank you for taking time to come for your Medicare Wellness Visit. I appreciate your ongoing commitment to your health goals. Please review the following plan we discussed and let me know if I can assist you in the future.   These are the goals we discussed: Goals    . Patient Stated     Will get some counseling to explore your journey now without your spouse     . Pt wants to complete appts for dermatologist and podiatrist.        This is a list of the screening recommended for you and due dates:  Health Maintenance  Topic Date Due  . Hemoglobin A1C  06/17/2019  . Complete foot exam   01/01/2020  . Eye exam for diabetics  01/01/2020  . Tetanus Vaccine  12/23/2025  . Flu Shot  Completed  . DEXA scan (bone density measurement)  Completed  . Pneumonia vaccines  Discontinued   A few things to remember from today's visit:   Moderate episode of recurrent major depressive disorder (HCC)  Incontinence of feces, unspecified fecal incontinence type  This exercise helps with mild urine leakage associated with cough, laughing, or sneezing. It may help with other types of urine incontinence and even with mild fecal incontinence.   Tighten and relax the pelvic muscles intermittently during the day. Once you are familiar with exercise try to hold pelvic muscles contraction for about 8-10 seconds.in the beginning you may not be able to hold contraction for more than a second or 2 but eventually you will be able to hold contraction harder and for longer time. Perform  8-12 exercises 3 times per day and daily for 15-20 weeks. You will need to continue exercises indefinitely to have a lasting effect.      Please be sure medication list is accurate. If a new problem present, please set up appointment sooner than planned today.

## 2019-01-30 NOTE — Progress Notes (Signed)
HPI:   [name] is a 78 y.o. female, who is here today with her daughter for AWV and to follow on some problems discussed last OV. She was last seen on 01/01/19.  Hx of DM II on non pharmacologic treatment. Hx of CVA,peripheral neuropathy,unstable gait,HTN, and HLD among some.  She lives with her daughter. She has a walker for transfer rest of ADL's independent ADL's. She does not drive.  Hx of falls,less frequent for the past few months after completed PT. Depression, she is on Wellbutrin 75 mg bid and Cymbalta 60 mg. She has not established with a new counselor.  GERD: Heartburn has resolved. She is taking Protonix 20 mg daily. Problem exacerbated by certain foods.  Denies abdominal pain, nausea, vomiting, changes in bowel habits, blood in stool or melena.  Functional Status Survey: Is the patient deaf or have difficulty hearing?: Yes Does the patient have difficulty seeing, even when wearing glasses/contacts?: No Does the patient have difficulty concentrating, remembering, or making decisions?: Yes Does the patient have difficulty walking or climbing stairs?: Yes Does the patient have difficulty dressing or bathing?: No Does the patient have difficulty doing errands alone such as visiting a doctor's office or shopping?: Yes  Fall Risk  01/30/2019 10/30/2017 09/26/2017 09/26/2017 09/22/2016  Falls in the past year? 1 Yes No No Yes  Number falls in past yr: 1 2 or more - - 2 or more  Injury with Fall? 0 - - - No  Risk for fall due to : Impaired balance/gait;Orthopedic patient - - - History of fall(s);Impaired balance/gait  Follow up Education provided - - - Education provided;Falls prevention discussed   Last fall 10/2018. She enjoys some games,puzzles.  Providers she sees regularly: Eye care provider: Dr. Donald Siva, last seen 01/21/19.  Depression screen PHQ 2/9 01/30/2019  Decreased Interest 1  Down, Depressed, Hopeless 0  PHQ - 2 Score 1  Altered  sleeping -  Tired, decreased energy -  Change in appetite -  Feeling bad or failure about yourself  -  Trouble concentrating -  Moving slowly or fidgety/restless -  Suicidal thoughts -  PHQ-9 Score -  Difficult doing work/chores -     Hearing Screening   125Hz  250Hz  500Hz  1000Hz  2000Hz  3000Hz  4000Hz  6000Hz  8000Hz   Right ear:           Left ear:           Vision Screening Comments: Refused.  Concerns today: Urine incontinence. She wears pull ups all day because urine leakage. This is a chronic problem. Denies dysuria,gross hematuria,increased frequency,or decreased urine output.  Occasionally fecal incontinence. She has not identified exacerbating or alleviating factors. She has had constipation for a while. She is not taking OTC medications. Daily bowel movements,small,straining sometimes.   Review of Systems  Constitutional: Positive for fatigue. Negative for activity change, appetite change and fever.  HENT: Positive for hearing loss and tinnitus (chronic). Negative for mouth sores, nosebleeds and trouble swallowing.   Eyes: Negative for redness and visual disturbance.  Respiratory: Negative for cough, shortness of breath and wheezing.   Cardiovascular: Negative for chest pain, palpitations and leg swelling.  Gastrointestinal: Positive for abdominal distention.  Genitourinary: Negative for difficulty urinating, pelvic pain and vaginal bleeding.  Musculoskeletal: Positive for back pain and gait problem.  Neurological: Negative for syncope, weakness and headaches.  Psychiatric/Behavioral: Negative for confusion. The patient is nervous/anxious.   Rest see pertinent positives and negatives per HPI.   Current  Outpatient Medications on File Prior to Visit  Medication Sig Dispense Refill  . ACCU-CHEK AVIVA PLUS test strip TEST BLOOD SUGAR ONCE DAILY 100 each 2  . ACCU-CHEK SOFTCLIX LANCETS lancets Use to check blood sugar once daily 100 each 2  . allopurinol (ZYLOPRIM) 100  MG tablet Take by mouth.    Marland Kitchen atorvastatin (LIPITOR) 40 MG tablet TAKE ONE TABLET BY MOUTH DAILY 74 tablet 1  . benazepril (LOTENSIN) 20 MG tablet Take by mouth.    Marland Kitchen buPROPion (WELLBUTRIN) 75 MG tablet Take by mouth.    . calcium carbonate (OSCAL) 1500 (600 Ca) MG TABS tablet Take 600 mg of elemental calcium by mouth at bedtime.     . Cholecalciferol (VITAMIN D3) 5000 units CAPS Take 5,000 Units by mouth at bedtime.     . clopidogrel (PLAVIX) 75 MG tablet Take by mouth.    . colchicine 0.6 MG tablet TAKE ONE TABLET BY MOUTH TWICE A DAY 30 tablet 0  . diclofenac sodium (VOLTAREN) 1 % GEL Place onto the skin.    . DULoxetine (CYMBALTA) 60 MG capsule Take by mouth.    . fluticasone (FLONASE) 50 MCG/ACT nasal spray Place 2 sprays into both nostrils as needed for allergies.     Marland Kitchen FLUZONE HIGH-DOSE QUADRIVALENT 0.7 ML SUSY     . ketorolac (ACULAR) 0.4 % SOLN 1 drop 4 (four) times daily. BEGIN 4 DAYS BEFORE SURG    . L-Lysine 500 MG CAPS Take 500 mg by mouth at bedtime.     Elmore Guise Devices (ACCU-CHEK SOFTCLIX) lancets Use to test blood sugar once a day. 100 each 2  . latanoprost (XALATAN) 0.005 % ophthalmic solution Place 1 drop into both eyes at bedtime.    . Melatonin 10 MG TBCR Take 10 mg by mouth at bedtime.     . moxifloxacin (VIGAMOX) 0.5 % ophthalmic solution Place one drop into the right eye 4 (four) times daily for 7 days. Dispense remainder for home use    . Multiple Vitamin (MULTIVITAMIN) capsule Take by mouth.    . Omega-3 Fatty Acids (FISH OIL) 1000 MG CAPS Take by mouth.    . pantoprazole (PROTONIX) 20 MG tablet Take 1 tablet (20 mg total) by mouth daily. 90 tablet 1  . prednisoLONE acetate (PRED FORTE) 1 % ophthalmic suspension Prednisolone 1% take one drop four times a day in the right eye for three weeks and then take one drop once a day in the right eye for one week then stop    . PRESCRIPTION MEDICATION Take 1 capsule by mouth 2 (two) times daily. New remedy vitamin mixture for  neuropathy    . Triamcinolone Acetonide (TRIAMCINOLONE 0.1 % CREAM : EUCERIN) CREA Apply 1 application topically 2 (two) times daily as needed for rash or itching. 500 each 1  . triamcinolone cream (KENALOG) 0.1 % Apply topically.    . vitamin E 400 UNIT capsule Take by mouth.     No current facility-administered medications on file prior to visit.      Past Medical History:  Diagnosis Date  . Chicken pox   . Depression   . Diabetes mellitus without complication ([employer])   . GERD (gastroesophageal reflux disease)   . Hyperlipidemia   . Hypertension   . Stroke ([place])   . UTI (urinary tract infection)    No Known Allergies  Family History  Problem Relation Age of Onset  . Hyperlipidemia Mother   . Heart disease Mother   . Stroke Mother   .  Hypertension Mother   . Hyperlipidemia Father   . Heart disease Father   . Stroke Father   . Hypertension Father   . Asthma Father   . Parkinson's disease Sister     Social History   Socioeconomic History  . Marital status: Widowed    Spouse name: Not on file  . Number of children: Not on file  . Years of education: Not on file  . Highest education level: Not on file  Occupational History  . Occupation: N/A  Social Needs  . Financial resource strain: Not on file  . Food insecurity    Worry: Not on file    Inability: Not on file  . Transportation needs    Medical: Not on file    Non-medical: Not on file  Tobacco Use  . Smoking status: Never Smoker  . Smokeless tobacco: Never Used  Substance and Sexual Activity  . Alcohol use: Yes    Comment: occassional wine  . Drug use: No  . Sexual activity: Not Currently  Lifestyle  . Physical activity    Days per week: Not on file    Minutes per session: Not on file  . Stress: Not on file  Relationships  . Social Herbalist on phone: Not on file    Gets together: Not on file    Attends religious service: Not on file    Active member of club or organization: Not on file     Attends meetings of clubs or organizations: Not on file    Relationship status: Not on file  Other Topics Concern  . 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   Physical Exam  Nursing note and vitals reviewed. Constitutional: She is oriented to person, place, and time. She appears well-developed and well-nourished. No distress.  HENT:  Head: Normocephalic and atraumatic.  Mouth/Throat: Oropharynx is clear and moist and mucous membranes are normal.  Eyes: Pupils are equal, round, and reactive to light. Conjunctivae are normal.  Cardiovascular: Normal rate and regular rhythm.  No murmur heard. HR 94/min by my count.  Respiratory: Effort normal and breath sounds normal. No respiratory distress.  GI: Soft. She exhibits no mass. There is no abdominal tenderness.  Musculoskeletal:        General: No edema.  Lymphadenopathy:    She has no cervical adenopathy.  Neurological: She is alert and oriented to person, place, and time. She has normal strength. No cranial nerve deficit.  Gait assisted by a walker.  Skin: Skin is warm. No rash noted. No erythema.  Psychiatric: Her mood appears anxious.  Well groomed, good eye contact.    ASSESSMENT AND PLAN:  Ms.Dameka was seen today for medicare wellness.  Diagnoses and all orders for this visit:  Medicare annual wellness visit, subsequent We discussed the importance of staying active, physically and mentally, as well as the benefits of a healthy/balance diet. Low impact exercise that involve stretching and strengthing are ideal. Vaccines up to date. We discussed preventive screening for the next 5-10 years, summery of recommendations given in AVS. Fall  prevention.  Advance directives and  end of life discussed, she does not have POA or living will.Strongly recommend end of life planning, advance directive package given.    Moderate episode of recurrent major depressive disorder ([place]) Problem is not well controlled, her daughter thinks it has improved. Continue Cymbalta and Wellbutrin, the latter one has been decreased because she does not feel like higher dose was helping.  Incontinence of feces, unspecified fecal incontinence type Possible causes discussed. ? Constipation. Recommend adequate fiber and fluid intake. Miralax 1/2 dose daily for 2 weeks,monitor for changes.  Urinary incontinence, unspecified type Stress incontinence. I do not think medication would help much and risk for side effects. Pelvic floor and Kegel exercises recommended. She has done pelvic floor PT. She is not interested in urology referral.   Gastroesophageal reflux disease, unspecified whether esophagitis present Improved. Continue Protonix 20 mg daily. GERD precautions also recommended.    Return for Keep next f/u appt, 06/2019.Marland Kitchen     Karter G. Martinique, MD  [facility]. [place] office.

## 2019-02-02 ENCOUNTER — Encounter: Payer: Self-pay | Admitting: Family Medicine

## 2019-02-09 ENCOUNTER — Other Ambulatory Visit: Payer: Self-pay | Admitting: Family Medicine

## 2019-02-20 ENCOUNTER — Other Ambulatory Visit: Payer: Self-pay | Admitting: Family Medicine

## 2019-02-20 ENCOUNTER — Telehealth: Payer: Self-pay | Admitting: Family Medicine

## 2019-02-20 MED ORDER — ATORVASTATIN CALCIUM 40 MG PO TABS: 40.0000 mg | ORAL_TABLET | Freq: Every day | ORAL | 2 refills | Status: DC

## 2019-02-20 NOTE — Telephone Encounter (Signed)
Patient is aware. Nothing further needed.  

## 2019-02-20 NOTE — Telephone Encounter (Signed)
Rx sent. Thanks, BJ 

## 2019-02-20 NOTE — Telephone Encounter (Signed)
Ok to change Rx?

## 2019-02-20 NOTE — Telephone Encounter (Signed)
Pharmacy called about the order for atorvastatin (LIPITOR) 40 MG tablet The Rx was sent in for 74 tablets and the pharmacist wanted approval to refill it for 90 tablets and 1 refill. Please advise

## 2019-02-25 DIAGNOSIS — Z01818 Encounter for other preprocedural examination: Secondary | ICD-10-CM | POA: Diagnosis not present

## 2019-02-27 ENCOUNTER — Other Ambulatory Visit: Payer: Self-pay | Admitting: Family Medicine

## 2019-02-28 DIAGNOSIS — I10 Essential (primary) hypertension: Secondary | ICD-10-CM | POA: Diagnosis not present

## 2019-02-28 DIAGNOSIS — K219 Gastro-esophageal reflux disease without esophagitis: Secondary | ICD-10-CM | POA: Diagnosis not present

## 2019-02-28 DIAGNOSIS — H2512 Age-related nuclear cataract, left eye: Secondary | ICD-10-CM | POA: Diagnosis not present

## 2019-02-28 DIAGNOSIS — H25812 Combined forms of age-related cataract, left eye: Secondary | ICD-10-CM | POA: Diagnosis not present

## 2019-02-28 DIAGNOSIS — Z961 Presence of intraocular lens: Secondary | ICD-10-CM | POA: Diagnosis not present

## 2019-02-28 DIAGNOSIS — Z79899 Other long term (current) drug therapy: Secondary | ICD-10-CM | POA: Diagnosis not present

## 2019-02-28 DIAGNOSIS — G4730 Sleep apnea, unspecified: Secondary | ICD-10-CM | POA: Diagnosis not present

## 2019-02-28 DIAGNOSIS — E785 Hyperlipidemia, unspecified: Secondary | ICD-10-CM | POA: Diagnosis not present

## 2019-02-28 DIAGNOSIS — Z8673 Personal history of transient ischemic attack (TIA), and cerebral infarction without residual deficits: Secondary | ICD-10-CM | POA: Diagnosis not present

## 2019-02-28 DIAGNOSIS — Z7982 Long term (current) use of aspirin: Secondary | ICD-10-CM | POA: Diagnosis not present

## 2019-02-28 DIAGNOSIS — H25012 Cortical age-related cataract, left eye: Secondary | ICD-10-CM | POA: Diagnosis not present

## 2019-02-28 DIAGNOSIS — M109 Gout, unspecified: Secondary | ICD-10-CM | POA: Diagnosis not present

## 2019-03-08 ENCOUNTER — Ambulatory Visit
Admission: RE | Admit: 2019-03-08 | Discharge: 2019-03-08 | Disposition: A | Discharge status: Home / Self Care | Payer: Medicare Other | Source: Ambulatory Visit | Attending: Family Medicine | Admitting: Family Medicine

## 2019-03-08 ENCOUNTER — Other Ambulatory Visit: Payer: Self-pay

## 2019-03-08 DIAGNOSIS — Z1231 Encounter for screening mammogram for malignant neoplasm of breast: Secondary | ICD-10-CM

## 2019-03-12 ENCOUNTER — Other Ambulatory Visit: Payer: Self-pay | Admitting: Family Medicine

## 2019-03-24 ENCOUNTER — Other Ambulatory Visit: Payer: Self-pay | Admitting: Family Medicine

## 2019-03-25 ENCOUNTER — Encounter: Payer: Self-pay | Admitting: Family Medicine

## 2019-04-03 ENCOUNTER — Encounter: Payer: Self-pay | Admitting: Family Medicine

## 2019-04-06 ENCOUNTER — Other Ambulatory Visit: Payer: Self-pay | Admitting: Family Medicine

## 2019-04-16 DIAGNOSIS — Z961 Presence of intraocular lens: Secondary | ICD-10-CM | POA: Diagnosis not present

## 2019-04-16 DIAGNOSIS — Z9842 Cataract extraction status, left eye: Secondary | ICD-10-CM | POA: Diagnosis not present

## 2019-04-25 ENCOUNTER — Ambulatory Visit: Payer: Medicare Other

## 2019-04-25 ENCOUNTER — Other Ambulatory Visit: Payer: Self-pay | Admitting: Family Medicine

## 2019-05-03 ENCOUNTER — Ambulatory Visit: Payer: Medicare Other | Attending: Internal Medicine

## 2019-05-03 DIAGNOSIS — Z23 Encounter for immunization: Secondary | ICD-10-CM | POA: Insufficient documentation

## 2019-05-03 NOTE — Progress Notes (Signed)
   Covid-19 Vaccination Clinic  Name:  Alexa Brown    MRN: [id number] DOB: 10-30-1940  05/03/2019  [name] was observed post Covid-19 immunization for 15 minutes without incidence. She was provided with Vaccine Information Sheet and instruction to access the V-Safe system.   [name] was instructed to call 911 with any severe reactions post vaccine: Marland Kitchen Difficulty breathing  . Swelling of your face and throat  . A fast heartbeat  . A bad rash all over your body  . Dizziness and weakness    Immunizations Administered    Name Date Dose VIS Date Route   Pfizer COVID-19 Vaccine 05/03/2019  4:48 PM 0.3 mL 03/08/2019 Intramuscular   Manufacturer: [place]   Lot: [id number]   [place]: [id number]

## 2019-05-16 ENCOUNTER — Ambulatory Visit: Payer: Medicare Other

## 2019-05-28 ENCOUNTER — Ambulatory Visit: Payer: Medicare Other | Attending: Internal Medicine

## 2019-05-28 DIAGNOSIS — Z23 Encounter for immunization: Secondary | ICD-10-CM | POA: Insufficient documentation

## 2019-05-28 NOTE — Progress Notes (Signed)
   Covid-19 Vaccination Clinic  Name:  Alexa Brown    MRN: [id number] DOB: Mar 03, 1941  05/28/2019  [name] was observed post Covid-19 immunization for 15 minutes without incident. She was provided with Vaccine Information Sheet and instruction to access the V-Safe system.   [name] was instructed to call 911 with any severe reactions post vaccine: Marland Kitchen Difficulty breathing  . Swelling of face and throat  . A fast heartbeat  . A bad rash all over body  . Dizziness and weakness   Immunizations Administered    Name Date Dose VIS Date Route   Pfizer COVID-19 Vaccine 05/28/2019  4:00 PM 0.3 mL 03/08/2019 Intramuscular   Manufacturer: [place]   Lot: [id number]   [place]: [id number]

## 2019-06-04 ENCOUNTER — Other Ambulatory Visit: Payer: Self-pay | Admitting: Family Medicine

## 2019-06-13 ENCOUNTER — Other Ambulatory Visit: Payer: Self-pay | Admitting: Family Medicine

## 2019-06-13 DIAGNOSIS — F331 Major depressive disorder, recurrent, moderate: Secondary | ICD-10-CM

## 2019-06-13 DIAGNOSIS — G629 Polyneuropathy, unspecified: Secondary | ICD-10-CM

## 2019-06-14 NOTE — Telephone Encounter (Signed)
I don't see where you've filled this before?  

## 2019-06-25 ENCOUNTER — Emergency Department (HOSPITAL_COMMUNITY): Payer: Medicare Other

## 2019-06-25 ENCOUNTER — Encounter (HOSPITAL_COMMUNITY): Payer: Self-pay | Admitting: Emergency Medicine

## 2019-06-25 ENCOUNTER — Emergency Department (HOSPITAL_COMMUNITY)
Admission: EM | Admit: 2019-06-25 | Discharge: 2019-06-25 | Disposition: A | Discharge status: Home / Self Care | Payer: Medicare Other | Attending: Emergency Medicine | Admitting: Emergency Medicine

## 2019-06-25 DIAGNOSIS — Z79899 Other long term (current) drug therapy: Secondary | ICD-10-CM | POA: Insufficient documentation

## 2019-06-25 DIAGNOSIS — E1122 Type 2 diabetes mellitus with diabetic chronic kidney disease: Secondary | ICD-10-CM | POA: Diagnosis not present

## 2019-06-25 DIAGNOSIS — I10 Essential (primary) hypertension: Secondary | ICD-10-CM | POA: Diagnosis not present

## 2019-06-25 DIAGNOSIS — Y999 Unspecified external cause status: Secondary | ICD-10-CM | POA: Diagnosis not present

## 2019-06-25 DIAGNOSIS — I129 Hypertensive chronic kidney disease with stage 1 through stage 4 chronic kidney disease, or unspecified chronic kidney disease: Secondary | ICD-10-CM | POA: Insufficient documentation

## 2019-06-25 DIAGNOSIS — W182XXA Fall in (into) shower or empty bathtub, initial encounter: Secondary | ICD-10-CM | POA: Insufficient documentation

## 2019-06-25 DIAGNOSIS — R0902 Hypoxemia: Secondary | ICD-10-CM | POA: Diagnosis not present

## 2019-06-25 DIAGNOSIS — S53025A Posterior dislocation of left radial head, initial encounter: Secondary | ICD-10-CM | POA: Insufficient documentation

## 2019-06-25 DIAGNOSIS — N183 Chronic kidney disease, stage 3 unspecified: Secondary | ICD-10-CM | POA: Diagnosis not present

## 2019-06-25 DIAGNOSIS — Y9389 Activity, other specified: Secondary | ICD-10-CM | POA: Insufficient documentation

## 2019-06-25 DIAGNOSIS — R52 Pain, unspecified: Secondary | ICD-10-CM | POA: Diagnosis not present

## 2019-06-25 DIAGNOSIS — S53125A Posterior dislocation of left ulnohumeral joint, initial encounter: Secondary | ICD-10-CM | POA: Diagnosis not present

## 2019-06-25 DIAGNOSIS — Y92002 Bathroom of unspecified non-institutional (private) residence single-family (private) house as the place of occurrence of the external cause: Secondary | ICD-10-CM | POA: Diagnosis not present

## 2019-06-25 DIAGNOSIS — W19XXXA Unspecified fall, initial encounter: Secondary | ICD-10-CM | POA: Diagnosis not present

## 2019-06-25 DIAGNOSIS — Z743 Need for continuous supervision: Secondary | ICD-10-CM | POA: Diagnosis not present

## 2019-06-25 DIAGNOSIS — S59902A Unspecified injury of left elbow, initial encounter: Secondary | ICD-10-CM | POA: Diagnosis present

## 2019-06-25 MED ORDER — PROPOFOL 10 MG/ML IV BOLUS
100.0000 mg | Freq: Once | INTRAVENOUS | Status: DC
  Filled 2019-06-25: qty 20

## 2019-06-25 MED ORDER — SODIUM CHLORIDE 0.9 % IV SOLN: INTRAVENOUS | Status: DC

## 2019-06-25 MED ORDER — HYDROCODONE-ACETAMINOPHEN 5-325 MG PO TABS: 1.0000 | ORAL_TABLET | Freq: Four times a day (QID) | ORAL | 0 refills | Status: DC | PRN

## 2019-06-25 MED ORDER — MORPHINE SULFATE (PF) 4 MG/ML IV SOLN
4.0000 mg | Freq: Once | INTRAVENOUS | Status: AC
  Administered 2019-06-25: 4 mg via INTRAVENOUS
  Filled 2019-06-25: qty 1

## 2019-06-25 MED ORDER — PROPOFOL 10 MG/ML IV BOLUS
INTRAVENOUS | Status: AC | PRN
  Administered 2019-06-25: 40 mg via INTRAVENOUS

## 2019-06-25 NOTE — ED Provider Notes (Addendum)
[facility] DEPT Provider Note   CSN: [id number] Arrival date & time: 06/25/19  1208     History No chief complaint on file.   Alexa Brown is a 79 y.o. female.  79 year old female presents with mechanical fall at home.  Slipped in the shower and injured her left elbow.  No LOC.  Complains of sharp pain is worse with movement and her left elbow.  Denies any shoulder.  Does take Plavix but denies any head injury.  No distal numbness or tingling to her left hand.  Placed in a sling and transported here        Past Medical History:  Diagnosis Date  . Chicken pox   . Depression   . Diabetes mellitus without complication ([place])   . GERD (gastroesophageal reflux disease)   . Hyperlipidemia   . Hypertension   . Stroke ([place])   . UTI (urinary tract infection)     Patient Active Problem List   Diagnosis Date Noted  . Generalized osteoarthritis of multiple sites 06/26/2018  . Gout, arthropathy 01/22/2018  . Lower back pain 09/22/2017  . Unstable gait 09/22/2017  . Seborrheic dermatitis 01/23/2017  . Seborrheic keratosis 09/27/2016  . CKD (chronic kidney disease), stage III ([place]) 05/28/2016  . GERD (gastroesophageal reflux disease) 02/25/2016  . CVA (cerebral vascular accident) (HCC)-No residual deficit 02/12/2016  . Glaucoma 02/12/2016  . Depression, major, recurrent ([place]) 02/12/2016  . Dyslipidemia 02/12/2016  . OSA (obstructive sleep apnea) 02/12/2016  . Vitamin D deficiency 01/14/2016  . DM (diabetes mellitus), type 2 with neurological complications ([place]) [id number]  . Systolic hypertension with cerebrovascular disease 01/14/2016  . Peripheral neuropathy 01/14/2016    Past Surgical History:  Procedure Laterality Date  . ABDOMINAL HYSTERECTOMY    . APPENDECTOMY    . BREAST BIOPSY Left 2014  . BREAST SURGERY     biopsy     OB History   No obstetric history on file.     Family History  Problem Relation Age of Onset  .  Hyperlipidemia Mother   . Heart disease Mother   . Stroke Mother   . Hypertension Mother   . Hyperlipidemia Father   . Heart disease Father   . Stroke Father   . Hypertension Father   . Asthma Father   . Parkinson's disease Sister     Social History   Tobacco Use  . Smoking status: Never Smoker  . Smokeless tobacco: Never Used  Substance Use Topics  . Alcohol use: Yes    Comment: occassional wine  . Drug use: No    Home Medications Prior to Admission medications   Medication Sig Start Date End Date Taking? Authorizing Provider  ACCU-CHEK AVIVA PLUS test strip TEST BLOOD SUGAR ONCE DAILY 08/27/18   Martinique, Amberia G, MD  ACCU-CHEK [facility] LANCETS lancets Use to check blood sugar once daily 07/25/17   Martinique, Burnetta G, MD  allopurinol (ZYLOPRIM) 100 MG tablet Take by mouth.    [provider]  atorvastatin (LIPITOR) 40 MG tablet TAKE ONE TABLET BY MOUTH DAILY 03/25/19   Martinique, Madolyn G, MD  benazepril (LOTENSIN) 20 MG tablet Take by mouth.    [provider]  buPROPion (WELLBUTRIN) 75 MG tablet TAKE ONE TABLET BY MOUTH TWICE A DAY 06/04/19   Martinique, Kem G, MD  calcium carbonate (OSCAL) 1500 (600 Ca) MG TABS tablet Take 600 mg of elemental calcium by mouth at bedtime.     [provider]  Cholecalciferol (VITAMIN  D3) 5000 units CAPS Take 5,000 Units by mouth at bedtime.     [provider]  clopidogrel (PLAVIX) 75 MG tablet TAKE ONE TABLET BY MOUTH DAILY 04/26/19   Martinique, Laronica G, MD  colchicine 0.6 MG tablet TAKE ONE TABLET BY MOUTH TWICE A DAY 04/08/19   Martinique, Wyonia G, MD  diclofenac sodium (VOLTAREN) 1 % GEL Place onto the skin. 09/24/18   [provider]  DULoxetine (CYMBALTA) 60 MG capsule TAKE ONE CAPSULE BY MOUTH DAILY 06/17/19   Martinique, Kaleigh G, MD  fluticasone [facility]) 50 MCG/ACT nasal spray Place 2 sprays into both nostrils as needed for allergies.  02/10/15   [provider]  FLUZONE HIGH-DOSE QUADRIVALENT 0.7 ML SUSY   11/17/18   [provider]  ketorolac (ACULAR) 0.4 % SOLN 1 drop 4 (four) times daily. BEGIN 4 DAYS BEFORE SURG    [provider]  L-Lysine 500 MG CAPS Take 500 mg by mouth at bedtime.     [provider]  Lancet Devices [facility]) lancets Use to test blood sugar once a day. 03/31/16   Martinique, Murel G, MD  latanoprost (XALATAN) 0.005 % ophthalmic solution Place 1 drop into both eyes at bedtime.    [provider]  Melatonin 10 MG TBCR Take 10 mg by mouth at bedtime.     [provider]  moxifloxacin (VIGAMOX) 0.5 % ophthalmic solution Place one drop into the right eye 4 (four) times daily for 7 days. Dispense remainder for home use 10/17/18   [provider]  Multiple Vitamin (MULTIVITAMIN) capsule Take by mouth.    [provider]  Omega-3 Fatty Acids (FISH OIL) 1000 MG CAPS Take by mouth.    [provider]  pantoprazole (PROTONIX) 20 MG tablet Take 1 tablet (20 mg total) by mouth daily. 01/01/19   Martinique, Zabdi G, MD  prednisoLONE acetate (PRED FORTE) 1 % ophthalmic suspension Prednisolone 1% take one drop four times a day in the right eye for three weeks and then take one drop once a day in the right eye for one week then stop 11/29/18   [provider]  PRESCRIPTION MEDICATION Take 1 capsule by mouth 2 (two) times daily. New remedy vitamin mixture for neuropathy    [provider]  Triamcinolone Acetonide (TRIAMCINOLONE 0.1 % CREAM : EUCERIN) CREA Apply 1 application topically 2 (two) times daily as needed for rash or itching. 06/26/18   Martinique, Delilah G, MD  triamcinolone cream (KENALOG) 0.1 % Apply topically.    [provider]  vitamin E 400 UNIT capsule Take by mouth.    [provider]    Allergies    Patient has no known allergies.  Review of Systems   Review of Systems  All other systems reviewed and are negative.   Physical Exam Updated Vital Signs BP (!) 160/79 (BP  Location: Right Arm)   Pulse 69   Temp (!) 97.4 F (36.3 C)   Resp 17   SpO2 95%   Physical Exam Vitals and nursing note reviewed.  Constitutional:      General: She is not in acute distress.    Appearance: Normal appearance. She is well-developed. She is not toxic-appearing.  HENT:     Head: Normocephalic and atraumatic.  Eyes:     General: Lids are normal.     Conjunctiva/sclera: Conjunctivae normal.     Pupils: Pupils are equal, round, and reactive to light.  Neck:     Thyroid:  No thyroid mass.     Trachea: No tracheal deviation.  Cardiovascular:     Rate and Rhythm: Normal rate and regular rhythm.     Heart sounds: Normal heart sounds. No murmur. No gallop.   Pulmonary:     Effort: Pulmonary effort is normal. No respiratory distress.     Breath sounds: Normal breath sounds. No stridor. No decreased breath sounds, wheezing, rhonchi or rales.  Abdominal:     General: Bowel sounds are normal. There is no distension.     Palpations: Abdomen is soft.     Tenderness: There is no abdominal tenderness. There is no rebound.  Musculoskeletal:     Left elbow: No lacerations. Decreased range of motion. Tenderness present.     Cervical back: Normal range of motion and neck supple.     Comments: No pain at the left shoulder or left wrist.  Skin:    General: Skin is warm and dry.     Findings: No abrasion or rash.  Neurological:     Mental Status: She is alert and oriented to person, place, and time.     GCS: GCS eye subscore is 4. GCS verbal subscore is 5. GCS motor subscore is 6.     Cranial Nerves: No cranial nerve deficit.     Sensory: No sensory deficit.  Psychiatric:        Speech: Speech normal.        Behavior: Behavior normal.     ED Results / Procedures / Treatments   Labs (all labs ordered are listed, but only abnormal results are displayed) Labs Reviewed - No data to display  EKG None  Radiology No results found.  Procedures .Sedation  Date/Time:  06/25/2019 2:58 PM Performed by: Lacretia Leigh, MD Authorized by: Lacretia Leigh, MD   Consent:    Consent obtained:  Verbal   Consent given by:  Patient   Risks discussed:  Allergic reaction, dysrhythmia, inadequate sedation, nausea, prolonged hypoxia resulting in organ damage, prolonged sedation necessitating reversal, respiratory compromise necessitating ventilatory assistance and intubation and vomiting   Alternatives discussed:  Analgesia without sedation, anxiolysis and regional anesthesia Universal protocol:    Procedure explained and questions answered to patient or proxy's satisfaction: yes     Relevant documents present and verified: yes     Test results available and properly labeled: yes     Imaging studies available: yes     Required blood products, implants, devices, and special equipment available: yes     Site/side marked: yes     Immediately prior to procedure a time out was called: yes     Patient identity confirmation method:  Verbally with patient Indications:    Procedure necessitating sedation performed by:  Physician performing sedation Pre-sedation assessment:    Time since last food or drink:  Yesterday   ASA classification: class 2 - patient with mild systemic disease     Neck mobility: normal     Mouth opening:  3 or more finger widths   Thyromental distance:  4 finger widths   Mallampati score:  I - soft palate, uvula, fauces, pillars visible   Pre-sedation assessments completed and reviewed: airway patency, cardiovascular function, hydration status, mental status, nausea/vomiting, pain level, respiratory function and temperature     Pre-sedation assessment completed:  06/25/2019 2:30 PM Immediate pre-procedure details:    Reassessment: Patient reassessed immediately prior to procedure     Reviewed: vital signs, relevant labs/tests and NPO status     Verified:  bag valve mask available, emergency equipment available, intubation equipment available, IV patency  confirmed, oxygen available and suction available   Procedure details (see MAR for exact dosages):    Preoxygenation:  Nasal cannula   Sedation:  Propofol   Intended level of sedation: deep   Intra-procedure monitoring:  Blood pressure monitoring, cardiac monitor, continuous pulse oximetry, frequent LOC assessments, frequent vital sign checks and continuous capnometry   Intra-procedure events: none     Total Provider sedation time (minutes):  15 Post-procedure details:    Post-sedation assessment completed:  06/25/2019 2:59 PM   Attendance: Constant attendance by certified staff until patient recovered     Recovery: Patient returned to pre-procedure baseline     Post-sedation assessments completed and reviewed: airway patency, cardiovascular function, hydration status, mental status, nausea/vomiting, pain level, respiratory function and temperature     Patient is stable for discharge or admission: yes     Patient tolerance:  Tolerated well, no immediate complications Reduction of dislocation  Date/Time: 06/25/2019 3:00 PM Performed by: Lacretia Leigh, MD Authorized by: Lacretia Leigh, MD  Consent: Verbal consent obtained. Written consent obtained. Risks and benefits: risks, benefits and alternatives were discussed Consent given by: patient Patient understanding: patient states understanding of the procedure being performed Required items: required blood products, implants, devices, and special equipment available Patient identity confirmed: verbally with patient and arm band Time out: Immediately prior to procedure a "time out" was called to verify the correct patient, procedure, equipment, support staff and site/side marked as required. Preparation: Patient was prepped and draped in the usual sterile fashion. Local anesthesia used: no  Anesthesia: Local anesthesia used: no  Sedation: Patient sedated: yes Sedatives: propofol Sedation start date/time: 06/25/2019 2:40 PM Sedation end  date/time: 06/25/2019 3:00 PM Vitals: Vital signs were monitored during sedation.  Patient tolerance: patient tolerated the procedure well with no immediate complications    (including critical care time)  Medications Ordered in ED Medications  morphine 4 MG/ML injection 4 mg (has no administration in time range)    ED Course  I have reviewed the triage vital signs and the nursing notes.  Pertinent labs & imaging results that were available during my care of the patient were reviewed by me and considered in my medical decision making (see chart for details).    MDM Rules/Calculators/A&P                     Case discussed with Dr. Lorin Mercy from orthopedics.  Recommends relocation in the follow-up in his office. Postreduction films show good anatomic alignment.  Patient has  good ulnar and radial pulses distally and is neurovascular intact at the left hand.  Patient return to baseline and will prescribe analgesics and give referral to Dr. Lorin Mercy Final Clinical Impression(s) / ED Diagnoses Final diagnoses:  None    Rx / DC Orders ED Discharge Orders    None       Lacretia Leigh, MD 06/25/19 1525    Lacretia Leigh, MD 06/25/19 [phone]

## 2019-06-25 NOTE — Sedation Documentation (Signed)
Elbow put back in place

## 2019-06-25 NOTE — ED Triage Notes (Signed)
Per EMS, patient from home, reports slip and fall in shower landing on left side. Deformity to left elbow.   20g R hand 229mcg Fentanyl with EMS

## 2019-06-25 NOTE — Sedation Documentation (Signed)
X-ray called for post-reduction confirmation

## 2019-06-26 ENCOUNTER — Telehealth: Payer: Self-pay | Admitting: Radiology

## 2019-06-26 MED ORDER — TRAMADOL HCL 50 MG PO TABS: 50.0000 mg | ORAL_TABLET | Freq: Two times a day (BID) | ORAL | 0 refills | Status: DC | PRN

## 2019-06-26 NOTE — Telephone Encounter (Signed)
Ultram send in #20   one po bid prn pain.   [facility] [employer]

## 2019-06-26 NOTE — Telephone Encounter (Signed)
I have patient scheduled with Dr. Lorin Mercy on Tuesday 4/6, follow up from shoulder dislocation. Advised patients daughter to keep arm in sling, do not take out to move around or straighter, can sleep in recliner - per Dr. Lorin Mercy.   Daughter stated managing pain pretty well. However is down to 9 pain pills now. Requesting more or other alternative until Tuesday appointment.

## 2019-06-26 NOTE — Telephone Encounter (Signed)
Please advise on pain medication. 

## 2019-06-26 NOTE — Telephone Encounter (Signed)
Called to pharmacy 

## 2019-07-01 ENCOUNTER — Other Ambulatory Visit: Payer: Self-pay

## 2019-07-02 ENCOUNTER — Encounter: Payer: Self-pay | Admitting: Family Medicine

## 2019-07-02 ENCOUNTER — Ambulatory Visit (INDEPENDENT_AMBULATORY_CARE_PROVIDER_SITE_OTHER): Payer: Medicare Other | Admitting: Family Medicine

## 2019-07-02 ENCOUNTER — Encounter: Payer: Self-pay | Admitting: Orthopaedic Surgery

## 2019-07-02 ENCOUNTER — Ambulatory Visit (INDEPENDENT_AMBULATORY_CARE_PROVIDER_SITE_OTHER): Payer: Medicare Other | Admitting: Orthopaedic Surgery

## 2019-07-02 VITALS — BP 140/80 | HR 96 | Temp 97.5°F | Resp 16 | Ht 66.0 in | Wt 158.8 lb

## 2019-07-02 DIAGNOSIS — G629 Polyneuropathy, unspecified: Secondary | ICD-10-CM | POA: Diagnosis not present

## 2019-07-02 DIAGNOSIS — E1149 Type 2 diabetes mellitus with other diabetic neurological complication: Secondary | ICD-10-CM | POA: Diagnosis not present

## 2019-07-02 DIAGNOSIS — S53105D Unspecified dislocation of left ulnohumeral joint, subsequent encounter: Secondary | ICD-10-CM

## 2019-07-02 DIAGNOSIS — I674 Hypertensive encephalopathy: Secondary | ICD-10-CM | POA: Diagnosis not present

## 2019-07-02 DIAGNOSIS — S53104A Unspecified dislocation of right ulnohumeral joint, initial encounter: Secondary | ICD-10-CM | POA: Insufficient documentation

## 2019-07-02 DIAGNOSIS — F331 Major depressive disorder, recurrent, moderate: Secondary | ICD-10-CM

## 2019-07-02 DIAGNOSIS — S53106A Unspecified dislocation of unspecified ulnohumeral joint, initial encounter: Secondary | ICD-10-CM | POA: Insufficient documentation

## 2019-07-02 DIAGNOSIS — N1831 Chronic kidney disease, stage 3a: Secondary | ICD-10-CM

## 2019-07-02 LAB — BASIC METABOLIC PANEL
BUN: 17 mg/dL (ref 6–23)
CO2: 28 mEq/L (ref 19–32)
Calcium: 9.9 mg/dL (ref 8.4–10.5)
Chloride: 103 mEq/L (ref 96–112)
Creatinine, Ser: 1.01 mg/dL (ref 0.40–1.20)
GFR: 53.14 mL/min — ABNORMAL LOW (ref >60.00)
Glucose, Bld: 177 mg/dL — ABNORMAL HIGH (ref 70–99)
Potassium: 4 mEq/L (ref 3.5–5.1)
Sodium: 140 mEq/L (ref 135–145)

## 2019-07-02 LAB — POCT GLYCOSYLATED HEMOGLOBIN (HGB A1C): Hemoglobin A1C: 5.9 % — AB (ref 4.0–5.6)

## 2019-07-02 NOTE — Assessment & Plan Note (Signed)
Stable. For now I do not recommend stopping meds, we can consider doing so next visit.

## 2019-07-02 NOTE — Progress Notes (Signed)
Office Visit Note   Patient: Alexa Brown           Date of Birth: 03/09/1941           MRN: [id number] Visit Date: 07/02/2019              Requested by: Martinique, English G, MD [address] [place],  [place] [address] PCP: Martinique, Joelyn G, MD   Assessment & Plan: Visit Diagnoses:  1. Dislocation of left elbow, subsequent encounter     Plan: She can remove the sling once or twice a day to work on elbow range of motion mostly focusing on extension.  We discussed positioning with the back of her humerus against the counter and gently pressing with her opposite hand at the wrist level.  She already has nearly symmetrical flexion.  Supination pronation is not a problem.  She will work on those exercises and then she can apply some ice for soreness.  She still has swelling and ecchymosis at her elbow as expected.  Recheck 3 weeks.  Follow-Up Instructions: Return in about 3 weeks (around 07/23/2019).   Orders:  No orders of the defined types were placed in this encounter.  No orders of the defined types were placed in this encounter.     Procedures: No procedures performed   Clinical Data: No additional findings.   Subjective: Chief Complaint  Patient presents with  . Left Elbow - Follow-up, Dislocation    Fall 06/25/2019    HPI follow-up elbow dislocation fall 06/25/2019 reduce emergency room under conscious sedation by ER MD.  She is in a sling.  Elbow is located.  Review of Systems updated and unchanged.   Objective: Vital Signs: Ht 5\' 6"  (1.676 m)   Wt 158 lb (71.7 kg)   BMI 25.50 kg/m   Physical Exam Constitutional:      Appearance: She is well-developed.  HENT:     Head: Normocephalic.     Right Ear: External ear normal.     Left Ear: External ear normal.  Eyes:     Pupils: Pupils are equal, round, and reactive to light.  Neck:     Thyroid: No thyromegaly.     Trachea: No tracheal deviation.  Cardiovascular:     Rate and Rhythm: Normal  rate.  Pulmonary:     Effort: Pulmonary effort is normal.  Abdominal:     Palpations: Abdomen is soft.  Skin:    General: Skin is warm and dry.  Neurological:     Mental Status: She is alert and oriented to person, place, and time.  Psychiatric:        Behavior: Behavior normal.     Ortho Exam there is ecchymosis at the olecranon.  She flexes with the thumb within 3 inches of touching her shoulder already.  Extension to 40 degrees with some pain.  Supination pronation is symmetrical.  Sensation her hand is normal.  Ulnar nerve function of the hand is strong.  Specialty Comments:  No specialty comments available.  Imaging: No results found.   PMFS History: Patient Active Problem List   Diagnosis Date Noted  . Elbow dislocation 07/02/2019  . Generalized osteoarthritis of multiple sites 06/26/2018  . Gout, arthropathy 01/22/2018  . Lower back pain 09/22/2017  . Unstable gait 09/22/2017  . Seborrheic dermatitis 01/23/2017  . Seborrheic keratosis 09/27/2016  . CKD (chronic kidney disease), stage III ([place]) 05/28/2016  . GERD (gastroesophageal reflux disease) 02/25/2016  . CVA (cerebral vascular accident) (  HCC)-No residual deficit 02/12/2016  . Glaucoma 02/12/2016  . Depression, major, recurrent ([place]) 02/12/2016  . Dyslipidemia 02/12/2016  . OSA (obstructive sleep apnea) 02/12/2016  . Vitamin D deficiency 01/14/2016  . DM (diabetes mellitus), type 2 with neurological complications ([place]) 99991111  . Systolic hypertension with cerebrovascular disease 01/14/2016  . Peripheral neuropathy 01/14/2016   Past Medical History:  Diagnosis Date  . Chicken pox   . Depression   . Diabetes mellitus without complication ([place])   . GERD (gastroesophageal reflux disease)   . Hyperlipidemia   . Hypertension   . Stroke ([place])   . UTI (urinary tract infection)     Family History  Problem Relation Age of Onset  . Hyperlipidemia Mother   . Heart disease Mother   . Stroke Mother   .  Hypertension Mother   . Hyperlipidemia Father   . Heart disease Father   . Stroke Father   . Hypertension Father   . Asthma Father   . Parkinson's disease Sister     Past Surgical History:  Procedure Laterality Date  . ABDOMINAL HYSTERECTOMY    . APPENDECTOMY    . BREAST BIOPSY Left 2014  . BREAST SURGERY     biopsy   Social History   Occupational History  . Occupation: N/A  Tobacco Use  . Smoking status: Never Smoker  . Smokeless tobacco: Never Used  Substance and Sexual Activity  . Alcohol use: Yes    Comment: occassional wine  . Drug use: No  . Sexual activity: Not Currently

## 2019-07-02 NOTE — Patient Instructions (Addendum)
A few things to remember from today's visit:   DM (diabetes mellitus), type 2 with neurological complications ([place]) - Plan: Basic metabolic panel  Stage 3a chronic kidney disease - Plan: Basic metabolic panel  Moderate episode of recurrent major depressive disorder (HCC)  A1C 5.9. No changes today. We may consider decreasing some of your meds next visit.  Please be sure medication list is accurate. If a new problem present, please set up appointment sooner than planned today.

## 2019-07-02 NOTE — Progress Notes (Signed)
HPI:   Alexa Brown is a 79 y.o. female, who is here today with her daughter for chronic disease management.   She was last seen on 01/30/19 Since her last visit,she had a fall at home, dislocated left elbow. Following with ortho, appt today.  HTN: She stopped Benazepril 20 mg daily.  Home BP readings: Most < 140/90, some 140's up to 150/90's. Negative for severe/frequent headache, visual changes, chest pain, dyspnea, palpitation, or edema. Wrist BP monitor. CKD III, she has not noted gross hematuria, foam in urine,or decreased urine output. + Hx of CVA. She is on Plavix 75 mg daily. Also taking Atorvastatin 40 mg daily.  Lab Results  Component Value Date   CREATININE 0.93 12/18/2018   BUN 15 12/18/2018   NA 141 12/18/2018   K 3.7 12/18/2018   CL 106 12/18/2018   CO2 27 12/18/2018   DM II: She is on non pharmacologic treatment. BS's "good." Negative for polydipsia,polyuria, or polyphagia.  Lab Results  Component Value Date   HGBA1C 5.9 12/18/2018  + Peripheral neuropathy (numbness,burining ,and tingling LE's). Cymbalta has helped. Unstable gait, she uses a cane and walker   She is not taking opioid meds prescribed for elbow pain. Taking Tylenol. Constipation better controlled with Miralax daily and Mg citrate weekly.  Depression: She is on Wellbutrin 75 mg bid and Cymbalta 60 mg daily. Her daughter thinks that in general mood has been good.  She is asking if medication can be wean off. Sometimes she feels "frustrated" because cannot do thinks she wants to do by herself. No suicidal thoughts.  Review of Systems  Constitutional: Negative for activity change, appetite change, fatigue, fever and unexpected weight change.  HENT: Negative for mouth sores, nosebleeds and sore throat.   Eyes: Negative for redness and visual disturbance.  Respiratory: Negative for cough and wheezing.   Gastrointestinal: Negative for abdominal pain, nausea and  vomiting.       Negative for changes in bowel habits.  Genitourinary: Negative for difficulty urinating and dysuria.  Musculoskeletal: Positive for arthralgias and gait problem.  Skin: Negative for rash and wound.  Neurological: Negative for syncope and facial asymmetry.  Psychiatric/Behavioral: Negative for confusion. The patient is nervous/anxious.   Rest of ROS, see pertinent positives sand negatives in HPI  Current Outpatient Medications on File Prior to Visit  Medication Sig Dispense Refill  . ACCU-CHEK AVIVA PLUS test strip TEST BLOOD SUGAR ONCE DAILY 100 each 2  . ACCU-CHEK SOFTCLIX LANCETS lancets Use to check blood sugar once daily 100 each 2  . allopurinol (ZYLOPRIM) 100 MG tablet Take 100 mg by mouth 2 (two) times daily.     Marland Kitchen atorvastatin (LIPITOR) 40 MG tablet TAKE ONE TABLET BY MOUTH DAILY (Patient taking differently: Take 40 mg by mouth daily. ) 90 tablet 0  . buPROPion (WELLBUTRIN) 75 MG tablet TAKE ONE TABLET BY MOUTH TWICE A DAY (Patient taking differently: Take 75 mg by mouth 2 (two) times daily. ) 60 tablet 3  . calcium carbonate (OSCAL) 1500 (600 Ca) MG TABS tablet Take 600 mg of elemental calcium by mouth at bedtime.     . Cholecalciferol (VITAMIN D3) 5000 units CAPS Take 5,000 Units by mouth at bedtime.     . clopidogrel (PLAVIX) 75 MG tablet TAKE ONE TABLET BY MOUTH DAILY (Patient taking differently: Take 75 mg by mouth daily. ) 90 tablet 1  . colchicine 0.6 MG tablet TAKE ONE TABLET BY MOUTH TWICE A DAY 30  tablet 0  . diclofenac sodium (VOLTAREN) 1 % GEL Place 2 g onto the skin 4 (four) times daily as needed (pain).     . DULoxetine (CYMBALTA) 60 MG capsule TAKE ONE CAPSULE BY MOUTH DAILY (Patient taking differently: Take 60 mg by mouth daily. ) 90 capsule 2  . fluticasone (FLONASE) 50 MCG/ACT nasal spray Place 2 sprays into both nostrils as needed for allergies.     Marland Kitchen FLUZONE HIGH-DOSE QUADRIVALENT 0.7 ML SUSY     . HYDROcodone-acetaminophen (NORCO/VICODIN) 5-325 MG  tablet Take 1-2 tablets by mouth every 6 (six) hours as needed. (Patient not taking: Reported on 07/02/2019) 12 tablet 0  . L-Lysine 500 MG CAPS Take 500 mg by mouth at bedtime.     Elmore Guise Devices (ACCU-CHEK SOFTCLIX) lancets Use to test blood sugar once a day. 100 each 2  . Melatonin 10 MG TBCR Take 10 mg by mouth at bedtime.     . Multiple Vitamin (MULTIVITAMIN) capsule Take 1 capsule by mouth daily.     . Omega-3 Fatty Acids (FISH OIL) 1000 MG CAPS Take 1,000 mg by mouth daily.     . pantoprazole (PROTONIX) 20 MG tablet Take 1 tablet (20 mg total) by mouth daily. 90 tablet 1  . PRESCRIPTION MEDICATION Take 1 capsule by mouth 2 (two) times daily. New remedy vitamin mixture for neuropathy    . traMADol (ULTRAM) 50 MG tablet Take 1 tablet (50 mg total) by mouth 2 (two) times daily as needed. (Patient not taking: Reported on 07/02/2019) 20 tablet 0  . Triamcinolone Acetonide (TRIAMCINOLONE 0.1 % CREAM : EUCERIN) CREA Apply 1 application topically 2 (two) times daily as needed for rash or itching. 500 each 1  . vitamin E 400 UNIT capsule Take 400 Units by mouth daily.      No current facility-administered medications on file prior to visit.     Past Medical History:  Diagnosis Date  . Chicken pox   . Depression   . Diabetes mellitus without complication ([place])   . GERD (gastroesophageal reflux disease)   . Hyperlipidemia   . Hypertension   . Stroke ([place])   . UTI (urinary tract infection)    No Known Allergies  Social History   Socioeconomic History  . Marital status: Widowed    Spouse name: Not on file  . Number of children: Not on file  . Years of education: Not on file  . Highest education level: Not on file  Occupational History  . Occupation: N/A  Tobacco Use  . Smoking status: Never Smoker  . Smokeless tobacco: Never Used  Substance and Sexual Activity  . Alcohol use: Yes    Comment: occassional wine  . Drug use: No  . Sexual activity: Not Currently  Other Topics  Concern  . Not on file  Social History Narrative   Pt lives in single story home with her daughter   Has 2 children   Some college education   Retired Engineer, production for [employer]   Social Determinants of Health   Financial Resource Strain:   . Difficulty of Paying Living Expenses:   Food Insecurity:   . Worried About Charity fundraiser in the Last Year:   . Arboriculturist in the Last Year:   Transportation Needs:   . Film/video editor (Medical):   Marland Kitchen Lack of Transportation (Non-Medical):   Physical Activity:   . Days of Exercise per Week:   . Minutes of Exercise per  Session:   Stress:   . Feeling of Stress :   Social Connections:   . Frequency of Communication with Friends and Family:   . Frequency of Social Gatherings with Friends and Family:   . Attends Religious Services:   . Active Member of Clubs or Organizations:   . Attends Archivist Meetings:   Marland Kitchen Marital Status:     Vitals:   07/02/19 0834  BP: 140/80  Pulse: 96  Resp: 16  Temp: (!) 97.5 F (36.4 C)  SpO2: 96%   Wt Readings from Last 3 Encounters:  07/02/19 158 lb (71.7 kg)  07/02/19 158 lb 12.8 oz (72 kg)  01/30/19 153 lb 6.4 oz (69.6 kg)    Body mass index is 25.63 kg/m.   Physical Exam  Nursing note and vitals reviewed. Constitutional: She is oriented to person, place, and time. She appears well-developed. No distress.  HENT:  Head: Normocephalic and atraumatic.  Mouth/Throat: Oropharynx is clear and moist and mucous membranes are normal.  Eyes: Pupils are equal, round, and reactive to light. Conjunctivae are normal.  Cardiovascular: Normal rate and regular rhythm.  No murmur heard. Pulses:      Dorsalis pedis pulses are 2+ on the right side and 2+ on the left side.  Respiratory: Effort normal and breath sounds normal. No respiratory distress.  GI: Soft. She exhibits no mass. There is no abdominal tenderness.  Musculoskeletal:        General: No edema.     Left  elbow: Decreased range of motion.     Comments: LUE in a sling.  Lymphadenopathy:    She has no cervical adenopathy.  Neurological: She is alert and oriented to person, place, and time. She has normal strength. No cranial nerve deficit.  Mildly unstable gait, daughter is assisting her.  Skin: Skin is warm. No rash noted. No erythema.  Psychiatric: Her mood appears anxious.  Well groomed, good eye contact.   ASSESSMENT AND PLAN:   Alexa Brown was seen today for chronic disease management.  Orders Placed This Encounter  Procedures  . Basic metabolic panel  . POC HgB A1c   Lab Results  Component Value Date   HGBA1C 5.9 (A) 07/02/2019   Lab Results  Component Value Date   CREATININE 1.01 07/02/2019   BUN 17 07/02/2019   NA 140 07/02/2019   K 4.0 07/02/2019   CL 103 07/02/2019   CO2 28 07/02/2019    DM (diabetes mellitus), type 2 with neurological complications ([place]) [id number] at goal. No changes in current management. Regular exercise and healthy diet with avoidance of added sugar food intake is an important part of treatment and recommended. Annual eye exam, periodic dental and foot care recommended. F/U in 5-6 months   CKD (chronic kidney disease), stage III (HCC) Continue adequate hydration,low salt diet,and avoidance of NSAID's. Adequate BP and glucose controlled.  Depression, major, recurrent ([place]) Stable. For now I do not recommend stopping meds, we can consider doing so next visit.  Systolic hypertension with cerebrovascular disease Today BP adequately controlled and most home BP's < 140/90, so for now continue non pharmacologic treatment. Benazepril lower dose can be resumed if BP > 140/90. Continue low salt diet. Continue monitoring BP's at home and bring monitor next visit.  Peripheral polyneuropathy Appropriate foot care. Continue Cymbalta 60 mg daily. Continue adequate glucose controlled.  Return in about 6 months (around  01/01/2020).   Jerriann G. Martinique, MD  [facility]. [place] office.  A few things to remember from today's visit:   A1C 5.9. No changes today. We may consider decreasing some of your meds next visit.  Please be sure medication list is accurate. If a new problem present, please set up appointment sooner than planned today.

## 2019-07-02 NOTE — Assessment & Plan Note (Signed)
Continue adequate hydration,low salt diet,and avoidance of NSAID's. Adequate BP and glucose controlled.

## 2019-07-02 NOTE — Assessment & Plan Note (Signed)
HgA1C at goal. No changes in current management. Regular exercise and healthy diet with avoidance of added sugar food intake is an important part of treatment and recommended. Annual eye exam, periodic dental and foot care recommended. F/U in 5-6 months  

## 2019-07-03 ENCOUNTER — Encounter: Payer: Self-pay | Admitting: Family Medicine

## 2019-07-04 ENCOUNTER — Encounter: Payer: Self-pay | Admitting: Family Medicine

## 2019-07-04 MED ORDER — BUPROPION HCL 75 MG PO TABS: 75.0000 mg | ORAL_TABLET | Freq: Two times a day (BID) | ORAL | 1 refills | Status: DC

## 2019-07-04 MED ORDER — ATORVASTATIN CALCIUM 40 MG PO TABS: 40.0000 mg | ORAL_TABLET | Freq: Every day | ORAL | 2 refills | Status: DC

## 2019-07-18 ENCOUNTER — Encounter: Payer: Self-pay | Admitting: Family Medicine

## 2019-07-19 ENCOUNTER — Other Ambulatory Visit: Payer: Self-pay | Admitting: Family Medicine

## 2019-07-19 MED ORDER — BENAZEPRIL HCL 20 MG PO TABS: 20.0000 mg | ORAL_TABLET | Freq: Every day | ORAL | 0 refills | Status: DC

## 2019-07-22 ENCOUNTER — Encounter: Payer: Self-pay | Admitting: Family Medicine

## 2019-07-23 ENCOUNTER — Encounter: Payer: Self-pay | Admitting: Orthopaedic Surgery

## 2019-07-23 ENCOUNTER — Other Ambulatory Visit: Payer: Self-pay

## 2019-07-23 ENCOUNTER — Ambulatory Visit (INDEPENDENT_AMBULATORY_CARE_PROVIDER_SITE_OTHER): Payer: Medicare Other | Admitting: Orthopaedic Surgery

## 2019-07-23 ENCOUNTER — Telehealth: Payer: Self-pay | Admitting: Family Medicine

## 2019-07-23 VITALS — BP 129/74 | HR 93 | Ht 66.0 in | Wt 158.0 lb

## 2019-07-23 DIAGNOSIS — S53105S Unspecified dislocation of left ulnohumeral joint, sequela: Secondary | ICD-10-CM | POA: Diagnosis not present

## 2019-07-23 NOTE — Telephone Encounter (Signed)
-----   Message from Beverlee Nims sent at 07/23/2019  8:06 AM EDT ----- Can you please arrange a 4 weeks follow-up appointment for hypertension.Thanks,BJ

## 2019-07-23 NOTE — Progress Notes (Signed)
Office Visit Note   Patient: Alexa Brown           Date of Birth: Jun 09, 1940           MRN: [id number] Visit Date: 07/23/2019              Requested by: Martinique, Maryn G, MD [address] [place],  [place] [address] PCP: Martinique, Danay G, MD   Assessment & Plan: Visit Diagnoses:  1. Dislocation of left elbow, sequela     Plan: Patient is done well with the rehab she is here with her daughter.  We will release her from care she can continue to work on picking up just a few more degrees of extension and can resume normal activities.  She will call if she has any problems.  Follow-Up Instructions: Return if symptoms worsen or fail to improve.   Orders:  No orders of the defined types were placed in this encounter.  No orders of the defined types were placed in this encounter.     Procedures: No procedures performed   Clinical Data: No additional findings.   Subjective: Chief Complaint  Patient presents with  . Left Elbow - Follow-up, Dislocation    Fall 06/25/2019    HPI 79 year old female returns post left elbow dislocation.  She is working on range of motion is full flexion and can touch her shoulder with her fingertips.  She lacks about 8 degrees reaching full extension and is still working on extension exercises.  She is not using her sling states she is not having any pain and is back to using her arm with normal activities of daily living.  Review of Systems unchanged over noted stage III kidney disease history of CVA with residual deficit and peripheral neuropathy.   Objective: Vital Signs: BP 129/74   Pulse 93   Ht 5\' 6"  (1.676 m)   Wt 158 lb (71.7 kg)   BMI 25.50 kg/m   Physical Exam Constitutional:      Appearance: She is well-developed.  HENT:     Head: Normocephalic.     Right Ear: External ear normal.     Left Ear: External ear normal.  Eyes:     Pupils: Pupils are equal, round, and reactive to light.  Neck:     Thyroid: No  thyromegaly.     Trachea: No tracheal deviation.  Cardiovascular:     Rate and Rhythm: Normal rate.  Pulmonary:     Effort: Pulmonary effort is normal.  Abdominal:     Palpations: Abdomen is soft.  Skin:    General: Skin is warm and dry.  Neurological:     Mental Status: She is alert and oriented to person, place, and time.  Psychiatric:        Behavior: Behavior normal.     Ortho Exam full supination pronation.  Good flexion.  She lacks 8 degrees reaching full extension left elbow.  Sensation hand is intact.  Specialty Comments:  No specialty comments available.  Imaging: No results found.   PMFS History: Patient Active Problem List   Diagnosis Date Noted  . Elbow dislocation 07/02/2019  . Generalized osteoarthritis of multiple sites 06/26/2018  . Gout, arthropathy 01/22/2018  . Lower back pain 09/22/2017  . Unstable gait 09/22/2017  . Seborrheic dermatitis 01/23/2017  . Seborrheic keratosis 09/27/2016  . CKD (chronic kidney disease), stage III ([place]) 05/28/2016  . GERD (gastroesophageal reflux disease) 02/25/2016  . CVA (cerebral vascular accident) (HCC)-No residual deficit  02/12/2016  . Glaucoma 02/12/2016  . Depression, major, recurrent ([place]) 02/12/2016  . Dyslipidemia 02/12/2016  . OSA (obstructive sleep apnea) 02/12/2016  . Vitamin D deficiency 01/14/2016  . DM (diabetes mellitus), type 2 with neurological complications ([place]) [id number]  . Systolic hypertension with cerebrovascular disease 01/14/2016  . Peripheral neuropathy 01/14/2016   Past Medical History:  Diagnosis Date  . Chicken pox   . Depression   . Diabetes mellitus without complication ([place])   . GERD (gastroesophageal reflux disease)   . Hyperlipidemia   . Hypertension   . Stroke ([place])   . UTI (urinary tract infection)     Family History  Problem Relation Age of Onset  . Hyperlipidemia Mother   . Heart disease Mother   . Stroke Mother   . Hypertension Mother   . Hyperlipidemia Father    . Heart disease Father   . Stroke Father   . Hypertension Father   . Asthma Father   . Parkinson's disease Sister     Past Surgical History:  Procedure Laterality Date  . ABDOMINAL HYSTERECTOMY    . APPENDECTOMY    . BREAST BIOPSY Left 2014  . BREAST SURGERY     biopsy   Social History   Occupational History  . Occupation: N/A  Tobacco Use  . Smoking status: Never Smoker  . Smokeless tobacco: Never Used  Substance and Sexual Activity  . Alcohol use: Yes    Comment: occassional wine  . Drug use: No  . Sexual activity: Not Currently

## 2019-07-23 NOTE — Telephone Encounter (Signed)
LVm to set up 4 wk follow up

## 2019-08-03 ENCOUNTER — Other Ambulatory Visit: Payer: Self-pay | Admitting: Family Medicine

## 2019-08-03 DIAGNOSIS — K219 Gastro-esophageal reflux disease without esophagitis: Secondary | ICD-10-CM

## 2019-08-07 ENCOUNTER — Ambulatory Visit (INDEPENDENT_AMBULATORY_CARE_PROVIDER_SITE_OTHER): Payer: Medicare Other | Admitting: Family Medicine

## 2019-08-07 ENCOUNTER — Encounter: Payer: Self-pay | Admitting: Family Medicine

## 2019-08-07 ENCOUNTER — Other Ambulatory Visit: Payer: Self-pay

## 2019-08-07 VITALS — BP 150/90 | HR 86 | Temp 97.0°F | Resp 16 | Ht 66.0 in | Wt 159.4 lb

## 2019-08-07 DIAGNOSIS — I674 Hypertensive encephalopathy: Secondary | ICD-10-CM

## 2019-08-07 DIAGNOSIS — F332 Major depressive disorder, recurrent severe without psychotic features: Secondary | ICD-10-CM

## 2019-08-07 LAB — BASIC METABOLIC PANEL
BUN: 18 mg/dL (ref 6–23)
CO2: 29 mEq/L (ref 19–32)
Calcium: 10.1 mg/dL (ref 8.4–10.5)
Chloride: 104 mEq/L (ref 96–112)
Creatinine, Ser: 0.96 mg/dL (ref 0.40–1.20)
GFR: 56.34 mL/min — ABNORMAL LOW (ref >60.00)
Glucose, Bld: 126 mg/dL — ABNORMAL HIGH (ref 70–99)
Potassium: 4.7 mEq/L (ref 3.5–5.1)
Sodium: 140 mEq/L (ref 135–145)

## 2019-08-07 MED ORDER — AMLODIPINE BESYLATE 5 MG PO TABS: 2.5000 mg | ORAL_TABLET | Freq: Every day | ORAL | 1 refills | Status: DC

## 2019-08-07 NOTE — Assessment & Plan Note (Signed)
Problem is not well controlled. No suicidal thoughts at this time. Medication interaction is a concern. Recommend establishing with psychiatrist. Her daughter sees one,she will ask if he is accepting new pts. Clearly instructed about warning signs.

## 2019-08-07 NOTE — Progress Notes (Signed)
Chief Complaint  Patient presents with  . Follow-up    4 week follow-up on HTN   HPI: [name] is a 79 y.o. female, who is here today with her daughter for follow up.   She was last seen on 07/02/19. After last visit she contacted the office and reported elevated BP. Somehow she discontinued benazepril a few months ago, last visit BP was adequately controlled. Benazepril 20 mg was resumed on 07/19/19. BP readings: 150s-160s/70s to 80s. She has tolerated medication well. Hx of CVA.  Denies unusual/frequent headache, visual changes, CP, dyspnea, palpitations, new focal weakness, or edema.  Lab Results  Component Value Date   CREATININE 1.01 07/02/2019   BUN 17 07/02/2019   NA 140 07/02/2019   K 4.0 07/02/2019   CL 103 07/02/2019   CO2 28 07/02/2019   Depression and anxiety: Currently she is on Cymbalta 60 mg daily and Wellbutrin 75 mg twice daily. She is reporting problem as stable but he has not been well controlled. She has had some thoughts of being better off dead, denies any suicidal ideation or having a plan. According to her daughter, she feels this way every time she has a fall.  Depression screen [facility] 2/9 08/07/2019 01/30/2019 09/26/2017 09/22/2016  Decreased Interest 3 1 3 3   Down, Depressed, Hopeless 2 0 - 3  PHQ - 2 Score 5 1 3 6   Altered sleeping 2 - 0 3  Tired, decreased energy 3 - 3 3  Change in appetite 3 - 0 3  Feeling bad or failure about yourself  3 - 3 0  Trouble concentrating 2 - 3 2  Moving slowly or fidgety/restless 1 - 0 0  Suicidal thoughts 1 - - 0  PHQ-9 Score 20 - 12 17  Difficult doing work/chores Somewhat difficult - Not difficult at all Very difficult   GAD 7 : Generalized Anxiety Score 08/07/2019  Nervous, Anxious, on Edge 1  Control/stop worrying 1  Worry too much - different things 1  Trouble relaxing 1  Restless 1  Easily annoyed or irritable 1  Afraid - awful might happen 1  Total GAD 7 Score 7  Anxiety Difficulty  Somewhat difficult   Review of Systems  Constitutional: Positive for fatigue. Negative for activity change, appetite change and fever.  HENT: Negative for mouth sores, nosebleeds and sore throat.   Eyes: Negative for redness and visual disturbance.  Respiratory: Negative for cough and wheezing.   Gastrointestinal: Negative for abdominal pain, nausea and vomiting.       Negative for changes in bowel habits.  Genitourinary: Negative for decreased urine volume, dysuria and hematuria.  Musculoskeletal: Positive for gait problem.  Neurological: Negative for syncope and facial asymmetry.  Psychiatric/Behavioral: Negative for confusion and hallucinations. The patient is nervous/anxious.   Rest of ROS, see pertinent positives sand negatives in HPI  Current Outpatient Medications on File Prior to Visit  Medication Sig Dispense Refill  . ACCU-CHEK AVIVA PLUS test strip TEST BLOOD SUGAR ONCE DAILY 100 each 2  . ACCU-CHEK SOFTCLIX LANCETS lancets Use to check blood sugar once daily 100 each 2  . allopurinol (ZYLOPRIM) 100 MG tablet Take 100 mg by mouth 2 (two) times daily.     Marland Kitchen atorvastatin (LIPITOR) 40 MG tablet Take 1 tablet (40 mg total) by mouth daily. 90 tablet 2  . benazepril (LOTENSIN) 20 MG tablet Take 1 tablet (20 mg total) by mouth daily. 90 tablet 0  . buPROPion (WELLBUTRIN) 75 MG  tablet Take 1 tablet (75 mg total) by mouth 2 (two) times daily. 180 tablet 1  . calcium carbonate (OSCAL) 1500 (600 Ca) MG TABS tablet Take 600 mg of elemental calcium by mouth at bedtime.     . Cholecalciferol (VITAMIN D3) 5000 units CAPS Take 5,000 Units by mouth at bedtime.     . clopidogrel (PLAVIX) 75 MG tablet TAKE ONE TABLET BY MOUTH DAILY (Patient taking differently: Take 75 mg by mouth daily. ) 90 tablet 1  . colchicine 0.6 MG tablet TAKE ONE TABLET BY MOUTH TWICE A DAY 30 tablet 0  . diclofenac sodium (VOLTAREN) 1 % GEL Place 2 g onto the skin 4 (four) times daily as needed (pain).     . DULoxetine  (CYMBALTA) 60 MG capsule TAKE ONE CAPSULE BY MOUTH DAILY (Patient taking differently: Take 60 mg by mouth daily. ) 90 capsule 2  . fluticasone (FLONASE) 50 MCG/ACT nasal spray Place 2 sprays into both nostrils as needed for allergies.     Marland Kitchen FLUZONE HIGH-DOSE QUADRIVALENT 0.7 ML SUSY     . L-Lysine 500 MG CAPS Take 500 mg by mouth at bedtime.     Elmore Guise Devices (ACCU-CHEK SOFTCLIX) lancets Use to test blood sugar once a day. 100 each 2  . Magnesium Citrate 100 MG TABS     . Melatonin 10 MG TBCR Take 10 mg by mouth at bedtime.     . Multiple Vitamin (MULTIVITAMIN) capsule Take 1 capsule by mouth daily.     . Omega-3 Fatty Acids (FISH OIL) 1000 MG CAPS Take 1,000 mg by mouth daily.     . pantoprazole (PROTONIX) 20 MG tablet TAKE ONE TABLET BY MOUTH DAILY  (NEW DOSE) 90 tablet 0  . polyethylene glycol (MIRALAX / GLYCOLAX) 17 g packet Take 17 g by mouth as needed.    Marland Kitchen PRESCRIPTION MEDICATION Take 1 capsule by mouth 2 (two) times daily. New remedy vitamin mixture for neuropathy    . Triamcinolone Acetonide (TRIAMCINOLONE 0.1 % CREAM : EUCERIN) CREA Apply 1 application topically 2 (two) times daily as needed for rash or itching. 500 each 1  . vitamin E 400 UNIT capsule Take 400 Units by mouth daily.      No current facility-administered medications on file prior to visit.   Past Medical History:  Diagnosis Date  . Chicken pox   . Depression   . Diabetes mellitus without complication ([place])   . GERD (gastroesophageal reflux disease)   . Hyperlipidemia   . Hypertension   . Stroke ([place])   . UTI (urinary tract infection)    No Known Allergies  Social History   Socioeconomic History  . Marital status: Widowed    Spouse name: Not on file  . Number of children: Not on file  . Years of education: Not on file  . Highest education level: Not on file  Occupational History  . Occupation: N/A  Tobacco Use  . Smoking status: Never Smoker  . Smokeless tobacco: Never Used  Substance and Sexual  Activity  . Alcohol use: Yes    Comment: occassional wine  . Drug use: No  . Sexual activity: Not Currently  Other Topics Concern  . Not on file  Social History Narrative   Pt lives in single story home with her daughter   Has 2 children   Some college education   Retired Engineer, production for [employer]   Social Determinants of Health   Financial Resource Strain:   . Difficulty  of Paying Living Expenses:   Food Insecurity:   . Worried About Charity fundraiser in the Last Year:   . Arboriculturist in the Last Year:   Transportation Needs:   . Film/video editor (Medical):   Marland Kitchen Lack of Transportation (Non-Medical):   Physical Activity:   . Days of Exercise per Week:   . Minutes of Exercise per Session:   Stress:   . Feeling of Stress :   Social Connections:   . Frequency of Communication with Friends and Family:   . Frequency of Social Gatherings with Friends and Family:   . Attends Religious Services:   . Active Member of Clubs or Organizations:   . Attends Archivist Meetings:   Marland Kitchen Marital Status:    Vitals:   08/07/19 1025  BP: (!) 150/90  Pulse: 86  Resp: 16  Temp: (!) 97 F (36.1 C)  SpO2: 95%   Body mass index is 25.72 kg/m.  Physical Exam  Nursing note and vitals reviewed. Constitutional: She is oriented to person, place, and time. She appears well-developed. No distress.  HENT:  Head: Normocephalic and atraumatic.  Mouth/Throat: Oropharynx is clear and moist and mucous membranes are normal.  Eyes: Pupils are equal, round, and reactive to light. Conjunctivae are normal.  Cardiovascular: Normal rate and regular rhythm.  No murmur heard. Pulses:      Posterior tibial pulses are 2+ on the right side and 2+ on the left side.  Respiratory: Effort normal and breath sounds normal. No respiratory distress.  GI: Soft. She exhibits no mass. There is no hepatomegaly. There is no abdominal tenderness.  Musculoskeletal:        General: No  edema.  Lymphadenopathy:    She has no cervical adenopathy.  Neurological: She is alert and oriented to person, place, and time. She has normal strength. No cranial nerve deficit.  Skin: Skin is warm. No rash noted. No erythema.  Psychiatric: She has a normal mood and affect.  Well groomed, good eye contact.   ASSESSMENT AND PLAN:   Alexa Brown was seen today for follow-up.  Orders Placed This Encounter  Procedures  . Basic metabolic panel   Lab Results  Component Value Date   CREATININE 0.96 08/07/2019   BUN 18 08/07/2019   NA 140 08/07/2019   K 4.7 08/07/2019   CL 104 08/07/2019   CO2 29 08/07/2019   Depression, major, recurrent ([place]) Problem is not well controlled. No suicidal thoughts at this time. Medication interaction is a concern. Recommend establishing with psychiatrist. Her daughter sees one,she will ask if he is accepting new pts. Clearly instructed about warning signs.  Systolic hypertension with cerebrovascular disease Not well controlled. Possible complications of elevated BP discussed. Amlodipine 2.5 mg added today to take at bedtime. No changes in Benazepril. Continue monitoring BP regularly, BP readings in 2-3 weeks. Low salt diet.   Return in about 3 months (around 11/07/2019) for 3-4 months f/u.  Sully G. Martinique, MD  [facility]. [place] office.  A few things to remember from today's visit:  I would like blood pressure under 140/90. Today we added Amlodipine 2.5 mg at bedtime. Please send blood pressure readings in 2-3 weeks. If still elevated we can increase Amlodipine to 5 mg. No changes in Benazepril. Continue low salt diet.   How to Take Your Blood Pressure You can take your blood pressure at home with a machine. You may need to check your  blood pressure at home:  To check if you have high blood pressure (hypertension).  To check your blood pressure over time.  To make sure your blood pressure medicine  is working. Supplies needed: You will need a blood pressure machine, or monitor. You can buy one at a drugstore or online. When choosing one:  Choose one with an arm cuff.  Choose one that wraps around your upper arm. Only one finger should fit between your arm and the cuff.  Do not choose one that measures your blood pressure from your wrist or finger. Your doctor can suggest a monitor. How to prepare Avoid these things for 30 minutes before checking your blood pressure:  Drinking caffeine.  Drinking alcohol.  Eating.  Smoking.  Exercising. Five minutes before checking your blood pressure:  Pee.  Sit in a dining chair. Avoid sitting in a soft couch or armchair.  Be quiet. Do not talk. How to take your blood pressure Follow the instructions that came with your machine. If you have a digital blood pressure monitor, these may be the instructions: 1. Sit up straight. 2. Place your feet on the floor. Do not cross your ankles or legs. 3. Rest your left arm at the level of your heart. You may rest it on a table, desk, or chair. 4. Pull up your shirt sleeve. 5. Wrap the blood pressure cuff around the upper part of your left arm. The cuff should be 1 inch (2.5 cm) above your elbow. It is best to wrap the cuff around bare skin. 6. Fit the cuff snugly around your arm. You should be able to place only one finger between the cuff and your arm. 7. Put the cord inside the groove of your elbow. 8. Press the power button. 9. Sit quietly while the cuff fills with air and loses air. 10. Write down the numbers on the screen. 11. Wait 2-3 minutes and then repeat steps 1-10. What do the numbers mean? Two numbers make up your blood pressure. The first number is called systolic pressure. The second is called diastolic pressure. An example of a blood pressure reading is "120 over 80" (or 120/80). If you are an adult and do not have a medical condition, use this guide to find out if your blood  pressure is normal: Normal  First number: below 120.  Second number: below 80. Elevated  First number: 120-129.  Second number: below 80. Hypertension stage 1  First number: 130-139.  Second number: 80-89. Hypertension stage 2  First number: 140 or above.  Second number: 4 or above. Your blood pressure is above normal even if only the top or bottom number is above normal. Follow these instructions at home:  Check your blood pressure as often as your doctor tells you to.  Take your monitor to your next doctor's appointment. Your doctor will: ? Make sure you are using it correctly. ? Make sure it is working right.  Make sure you understand what your blood pressure numbers should be.  Tell your doctor if your medicines are causing side effects. Contact a doctor if:  Your blood pressure keeps being high. Get help right away if:  Your first blood pressure number is higher than 180.  Your second blood pressure number is higher than 120. This information is not intended to replace advice given to you by your health care provider. Make sure you discuss any questions you have with your health care provider. Document Revised: 02/24/2017 Document Reviewed: 08/21/2015 Elsevier  Patient Education  [employer].  If you need refills please call your pharmacy. Do not use My Chart to request refills or for acute issues that need immediate attention.    Please be sure medication list is accurate. If a new problem present, please set up appointment sooner than planned today.  PSYCHIATRIC OFFICES AND PSYCHIATRISTS IN THE AREA   -[facility] [phone] [facility] [phone] [facility] [phone] -[facility] [phone]  Dr Hardie Shackleton [phone] Dr Alexander Bergeron, MD [phone] Dr Chucky May, MD [phone])   Dr Allayne Butcher. Marcelino Freestone, MD [phone] Dr Geralyn Flash A. [facility]  Dr Sheralyn Boatman, MD [phone])  Dr Fredonia Highland, MD [phone]  Dr Luz Lex [phone]

## 2019-08-07 NOTE — Assessment & Plan Note (Signed)
Not well controlled. Possible complications of elevated BP discussed. Amlodipine 2.5 mg added today to take at bedtime. No changes in Benazepril. Continue monitoring BP regularly, BP readings in 2-3 weeks. Low salt diet.

## 2019-08-07 NOTE — Patient Instructions (Addendum)
A few things to remember from today's visit:   I would like blood pressure under 140/90. Today we added Amlodipine 2.5 mg at bedtime. Please send blood pressure readings in 2-3 weeks. If still elevated we can increase Amlodipine to 5 mg. No changes in Benazepril. Continue low salt diet.   How to Take Your Blood Pressure You can take your blood pressure at home with a machine. You may need to check your blood pressure at home:  To check if you have high blood pressure (hypertension).  To check your blood pressure over time.  To make sure your blood pressure medicine is working. Supplies needed: You will need a blood pressure machine, or monitor. You can buy one at a drugstore or online. When choosing one:  Choose one with an arm cuff.  Choose one that wraps around your upper arm. Only one finger should fit between your arm and the cuff.  Do not choose one that measures your blood pressure from your wrist or finger. Your doctor can suggest a monitor. How to prepare Avoid these things for 30 minutes before checking your blood pressure:  Drinking caffeine.  Drinking alcohol.  Eating.  Smoking.  Exercising. Five minutes before checking your blood pressure:  Pee.  Sit in a dining chair. Avoid sitting in a soft couch or armchair.  Be quiet. Do not talk. How to take your blood pressure Follow the instructions that came with your machine. If you have a digital blood pressure monitor, these may be the instructions: 1. Sit up straight. 2. Place your feet on the floor. Do not cross your ankles or legs. 3. Rest your left arm at the level of your heart. You may rest it on a table, desk, or chair. 4. Pull up your shirt sleeve. 5. Wrap the blood pressure cuff around the upper part of your left arm. The cuff should be 1 inch (2.5 cm) above your elbow. It is best to wrap the cuff around bare skin. 6. Fit the cuff snugly around your arm. You should be able to place only one finger  between the cuff and your arm. 7. Put the cord inside the groove of your elbow. 8. Press the power button. 9. Sit quietly while the cuff fills with air and loses air. 10. Write down the numbers on the screen. 11. Wait 2-3 minutes and then repeat steps 1-10. What do the numbers mean? Two numbers make up your blood pressure. The first number is called systolic pressure. The second is called diastolic pressure. An example of a blood pressure reading is "120 over 80" (or 120/80). If you are an adult and do not have a medical condition, use this guide to find out if your blood pressure is normal: Normal  First number: below 120.  Second number: below 80. Elevated  First number: 120-129.  Second number: below 80. Hypertension stage 1  First number: 130-139.  Second number: 80-89. Hypertension stage 2  First number: 140 or above.  Second number: 21 or above. Your blood pressure is above normal even if only the top or bottom number is above normal. Follow these instructions at home:  Check your blood pressure as often as your doctor tells you to.  Take your monitor to your next doctor's appointment. Your doctor will: ? Make sure you are using it correctly. ? Make sure it is working right.  Make sure you understand what your blood pressure numbers should be.  Tell your doctor if your medicines are  causing side effects. Contact a doctor if:  Your blood pressure keeps being high. Get help right away if:  Your first blood pressure number is higher than 180.  Your second blood pressure number is higher than 120. This information is not intended to replace advice given to you by your health care provider. Make sure you discuss any questions you have with your health care provider. Document Revised: 02/24/2017 Document Reviewed: 08/21/2015 Elsevier Patient Education  2020 [employer].  If you need refills please call your pharmacy. Do not use My Chart to request refills or  for acute issues that need immediate attention.    Please be sure medication list is accurate. If a new problem present, please set up appointment sooner than planned today.  PSYCHIATRIC OFFICES AND PSYCHIATRISTS IN THE AREA   -[facility] [phone] [facility] [phone] [facility] [phone] -[facility] [phone]  Dr Hardie Shackleton [phone] Dr Alexander Bergeron, MD [phone] Dr Chucky May, MD [phone])   Dr Allayne Butcher. Marcelino Freestone, MD [phone] Dr Geralyn Flash A. [facility]  Dr Sheralyn Boatman, MD [phone])  Dr Fredonia Highland, MD [phone]  Dr Luz Lex [phone]

## 2019-09-09 ENCOUNTER — Other Ambulatory Visit: Payer: Self-pay | Admitting: Family Medicine

## 2019-09-10 ENCOUNTER — Other Ambulatory Visit: Payer: Self-pay | Admitting: *Deleted

## 2019-09-10 ENCOUNTER — Telehealth: Payer: Self-pay | Admitting: Family Medicine

## 2019-09-10 DIAGNOSIS — E1149 Type 2 diabetes mellitus with other diabetic neurological complication: Secondary | ICD-10-CM

## 2019-09-10 MED ORDER — ACCU-CHEK SOFTCLIX LANCETS MISC: 2 refills | Status: DC

## 2019-09-10 NOTE — Telephone Encounter (Signed)
°  ACCU-CHEK SOFTCLIX LANCETS lancets  [place], [place] Phone:  [phone]  Fax:  [phone]

## 2019-09-10 NOTE — Telephone Encounter (Signed)
Rx sent to pharmacy as requested.

## 2019-09-18 ENCOUNTER — Telehealth: Payer: Self-pay | Admitting: Family Medicine

## 2019-09-18 NOTE — Progress Notes (Signed)
  Chronic Care Management   Note  09/18/2019 Name: Alexa Brown MRN: [id number] DOB: 1940-07-19  Alexa Brown is a 79 y.o. year old female who is a primary care patient of Martinique, Malka So, MD. I reached out to [place] by phone today in response to a referral sent by [name]'s PCP, Martinique, Waver G, MD.   Alexa Brown was given information about Chronic Care Management services today including:  1. CCM service includes personalized support from designated clinical staff supervised by her physician, including individualized plan of care and coordination with other care providers 2. 24/7 contact phone numbers for assistance for urgent and routine care needs. 3. Service will only be billed when office clinical staff spend 20 minutes or more in a month to coordinate care. 4. Only one practitioner may furnish and bill the service in a calendar month. 5. The patient may stop CCM services at any time (effective at the end of the month) by phone call to the office staff.   Patient agreed to services and verbal consent obtained.   Follow up plan:   [place]

## 2019-09-18 NOTE — Progress Notes (Signed)
  Chronic Care Management   Outreach Note  09/18/2019 Name: [name] MRN: [id number] DOB: 09/19/1940  Referred by: Martinique, Devita G, MD Reason for referral : Chronic Care Management (Initial CCM Outreach)   An unsuccessful telephone outreach was attempted today. The patient was referred to the pharmacist for assistance with care management and care coordination.   Follow Up Plan:   [place]

## 2019-10-07 ENCOUNTER — Other Ambulatory Visit: Payer: Self-pay | Admitting: Family Medicine

## 2019-10-17 DIAGNOSIS — E119 Type 2 diabetes mellitus without complications: Secondary | ICD-10-CM | POA: Diagnosis not present

## 2019-10-17 DIAGNOSIS — H40033 Anatomical narrow angle, bilateral: Secondary | ICD-10-CM | POA: Diagnosis not present

## 2019-10-17 DIAGNOSIS — H26493 Other secondary cataract, bilateral: Secondary | ICD-10-CM | POA: Diagnosis not present

## 2019-11-01 ENCOUNTER — Other Ambulatory Visit: Payer: Self-pay | Admitting: Family Medicine

## 2019-11-01 DIAGNOSIS — K219 Gastro-esophageal reflux disease without esophagitis: Secondary | ICD-10-CM

## 2019-11-12 ENCOUNTER — Encounter: Payer: Self-pay | Admitting: Family Medicine

## 2019-11-12 ENCOUNTER — Ambulatory Visit (INDEPENDENT_AMBULATORY_CARE_PROVIDER_SITE_OTHER): Payer: Medicare Other | Admitting: Family Medicine

## 2019-11-12 ENCOUNTER — Other Ambulatory Visit: Payer: Self-pay

## 2019-11-12 VITALS — BP 128/80 | HR 84 | Temp 97.9°F | Resp 16 | Ht 66.0 in | Wt 162.0 lb

## 2019-11-12 DIAGNOSIS — G629 Polyneuropathy, unspecified: Secondary | ICD-10-CM

## 2019-11-12 DIAGNOSIS — I674 Hypertensive encephalopathy: Secondary | ICD-10-CM | POA: Diagnosis not present

## 2019-11-12 DIAGNOSIS — I499 Cardiac arrhythmia, unspecified: Secondary | ICD-10-CM | POA: Diagnosis not present

## 2019-11-12 DIAGNOSIS — M109 Gout, unspecified: Secondary | ICD-10-CM | POA: Diagnosis not present

## 2019-11-12 MED ORDER — ALLOPURINOL 100 MG PO TABS: 100.0000 mg | ORAL_TABLET | Freq: Two times a day (BID) | ORAL | 3 refills | Status: DC

## 2019-11-12 MED ORDER — AMLODIPINE BESYLATE 5 MG PO TABS: 5.0000 mg | ORAL_TABLET | Freq: Every day | ORAL | 1 refills | Status: DC

## 2019-11-12 NOTE — Assessment & Plan Note (Addendum)
This problem can be contributing to sensation of weakness. Fall prevention discussed. Appropriate foot care.

## 2019-11-12 NOTE — Patient Instructions (Signed)
A few things to remember from today's visit: Increase Amlodipine dose for 2.5 mg to 5 mg. You can hold on Atorvastatin for 3-4 weeks and monitor for changes. If no changes you could resume Atorvastatin 1/2 tab (20 mg) instead 40 mg.  If you need refills please call your pharmacy. Do not use My Chart to request refills or for acute issues that need immediate attention.    Please be sure medication list is accurate. If a new problem present, please set up appointment sooner than planned today.

## 2019-11-12 NOTE — Progress Notes (Signed)
HPI: [name] is a 79 y.o. female, who is here today with her daughter for follow up.   She was last seen on 08/07/19.  HTN: BP still running high at home. 140's-15's/70's, occasionally SBP 160-170. States that in a few occasional her BP monitor has reported "atrial fib", usually when she is upset. Denies severe/frequent headache,chest pain, dyspnea, palpitation,or edema. HR: 70-80's.  HLD: She would like to stop statin because she is having side effects: Weak muscle among other she cannot described. Hx of peripheral neuropathy. She is on Cymbalta 60 mg daily. She has not had a fall since her last visit.  Lab Results  Component Value Date   CREATININE 0.96 08/07/2019   BUN 18 08/07/2019   NA 140 08/07/2019   K 4.7 08/07/2019   CL 104 08/07/2019   CO2 29 08/07/2019   Gout: She is on Allopurinol 100 mg bid. She has not had a gout attack in years. Lab Results  Component Value Date   LABURIC 5.7 12/18/2018   Review of Systems  Constitutional: Negative for activity change, appetite change and fever.  HENT: Negative for mouth sores, nosebleeds and sore throat.   Eyes: Negative for redness and visual disturbance.  Respiratory: Negative for cough and wheezing.   Gastrointestinal: Negative for abdominal pain, nausea and vomiting.       Negative for changes in bowel habits.  Genitourinary: Negative for decreased urine volume, dysuria and hematuria.  Neurological: Negative for syncope, facial asymmetry and weakness.  Rest of ROS, see pertinent positives sand negatives in HPI  Current Outpatient Medications on File Prior to Visit  Medication Sig Dispense Refill  . ACCU-CHEK AVIVA PLUS test strip USE TO TEST BLOOD SUGAR ONCE DAILY 100 strip 1  . Accu-Chek Softclix Lancets lancets Use to check blood sugar once daily 100 each 2  . atorvastatin (LIPITOR) 40 MG tablet Take 1 tablet (40 mg total) by mouth daily. 90 tablet 2  . benazepril (LOTENSIN) 20 MG tablet  TAKE ONE TABLET BY MOUTH DAILY 90 tablet 1  . buPROPion (WELLBUTRIN) 75 MG tablet Take 1 tablet (75 mg total) by mouth 2 (two) times daily. 180 tablet 1  . calcium carbonate (OSCAL) 1500 (600 Ca) MG TABS tablet Take 600 mg of elemental calcium by mouth at bedtime.     . Cholecalciferol (VITAMIN D3) 5000 units CAPS Take 5,000 Units by mouth at bedtime.     . clopidogrel (PLAVIX) 75 MG tablet TAKE ONE TABLET BY MOUTH DAILY (Patient taking differently: Take 75 mg by mouth daily. ) 90 tablet 1  . colchicine 0.6 MG tablet TAKE ONE TABLET BY MOUTH TWICE A DAY 30 tablet 0  . diclofenac sodium (VOLTAREN) 1 % GEL Place 2 g onto the skin 4 (four) times daily as needed (pain).     . DULoxetine (CYMBALTA) 60 MG capsule TAKE ONE CAPSULE BY MOUTH DAILY (Patient taking differently: Take 60 mg by mouth daily. ) 90 capsule 2  . fluticasone (FLONASE) 50 MCG/ACT nasal spray Place 2 sprays into both nostrils as needed for allergies.     Marland Kitchen L-Lysine 500 MG CAPS Take 500 mg by mouth at bedtime.     Elmore Guise Devices (ACCU-CHEK SOFTCLIX) lancets Use to test blood sugar once a day. 100 each 2  . Magnesium Citrate 100 MG TABS     . Melatonin 10 MG TBCR Take 10 mg by mouth at bedtime.     . Multiple Vitamin (MULTIVITAMIN) capsule Take 1  capsule by mouth daily.     . Omega-3 Fatty Acids (FISH OIL) 1000 MG CAPS Take 1,000 mg by mouth daily.     . pantoprazole (PROTONIX) 20 MG tablet TAKE ONE TABLET BY MOUTH DAILY  (NEW DOSE) 90 tablet 2  . polyethylene glycol (MIRALAX / GLYCOLAX) 17 g packet Take 17 g by mouth as needed.    Marland Kitchen PRESCRIPTION MEDICATION Take 1 capsule by mouth 2 (two) times daily. New remedy vitamin mixture for neuropathy    . Triamcinolone Acetonide (TRIAMCINOLONE 0.1 % CREAM : EUCERIN) CREA Apply 1 application topically 2 (two) times daily as needed for rash or itching. 500 each 1  . vitamin E 400 UNIT capsule Take 400 Units by mouth daily.      No current facility-administered medications on file prior to  visit.   Past Medical History:  Diagnosis Date  . Chicken pox   . Depression   . Diabetes mellitus without complication ([place])   . GERD (gastroesophageal reflux disease)   . Hyperlipidemia   . Hypertension   . Stroke ([place])   . UTI (urinary tract infection)    No Known Allergies  Social History   Socioeconomic History  . Marital status: Widowed    Spouse name: Not on file  . Number of children: Not on file  . Years of education: Not on file  . Highest education level: Not on file  Occupational History  . Occupation: N/A  Tobacco Use  . Smoking status: Never Smoker  . Smokeless tobacco: Never Used  Vaping Use  . Vaping Use: Never used  Substance and Sexual Activity  . Alcohol use: Yes    Comment: occassional wine  . Drug use: No  . Sexual activity: Not Currently  Other Topics Concern  . Not on file  Social History Narrative   Pt lives in single story home with her daughter   Has 2 children   Some college education   Retired Engineer, production for [employer]   Social Determinants of Health   Financial Resource Strain:   . Difficulty of Paying Living Expenses:   Food Insecurity:   . Worried About Charity fundraiser in the Last Year:   . Arboriculturist in the Last Year:   Transportation Needs:   . Film/video editor (Medical):   Marland Kitchen Lack of Transportation (Non-Medical):   Physical Activity:   . Days of Exercise per Week:   . Minutes of Exercise per Session:   Stress:   . Feeling of Stress :   Social Connections:   . Frequency of Communication with Friends and Family:   . Frequency of Social Gatherings with Friends and Family:   . Attends Religious Services:   . Active Member of Clubs or Organizations:   . Attends Archivist Meetings:   Marland Kitchen Marital Status:     Vitals:   11/12/19 0928  BP: 128/80  Pulse: 84  Resp: 16  Temp: 97.9 F (36.6 C)  SpO2: 92%   Body mass index is 26.15 kg/m.  Physical Exam Vitals and nursing note  reviewed.  Constitutional:      General: She is not in acute distress.    Appearance: She is well-developed.  HENT:     Head: Normocephalic and atraumatic.     Mouth/Throat:     Mouth: Mucous membranes are moist.     Pharynx: Oropharynx is clear.  Eyes:     Conjunctiva/sclera: Conjunctivae normal.  Cardiovascular:  Rate and Rhythm: Normal rate and regular rhythm.     Heart sounds: No murmur heard.      Comments: DP pulses present. Pulmonary:     Effort: Pulmonary effort is normal. No respiratory distress.     Breath sounds: Normal breath sounds.  Abdominal:     Palpations: Abdomen is soft. There is no hepatomegaly or mass.     Tenderness: There is no abdominal tenderness.  Lymphadenopathy:     Cervical: No cervical adenopathy.  Skin:    General: Skin is warm.     Findings: No erythema or rash.  Neurological:     Mental Status: She is alert and oriented to person, place, and time.     Cranial Nerves: No cranial nerve deficit.     Comments: Unstable gait assisted with a walker.  Psychiatric:     Comments: Well groomed, good eye contact.   ASSESSMENT AND PLAN:  Ms. Alexa Brown was seen today for follow-up.  Orders Placed This Encounter  Procedures  . EKG 12-Lead    Peripheral neuropathy This problem can be contributing to sensation of weakness. Fall prevention discussed. Appropriate foot care.  Systolic hypertension with cerebrovascular disease BP re-checked x 2 140/70, 145/75. BP readings at home mildly elevated, so we will increase dose of Amlodipine from 2.5 mg to 5 mg. No changes in Benazepril. Continue monitoring BP at home and low salt diet.  Gout, arthropathy Problem is well controlled. No changes in current management.  Irregular heart rate She has had PVC's in the past. BP monitor is most likely reporting irregular HR. Asymptomatic. EKG today: SR,normal axis and intervals. Compared with EKG 01/30/18 PVC is not longer present.  No Hx  of atrial fib,we discussed dx and prognosis.She refers to hold on cardio referral. Recommend taking a picture of monitor when she sees "atrial fib" on monitor and send ot to me. Instructed about about warning signs.   Return in about 3 months (around 02/12/2020) for HTN,DM II,HLD.   Harue G. Martinique, MD  [facility]. [place] office.   A few things to remember from today's visit: Increase Amlodipine dose for 2.5 mg to 5 mg. You can hold on Atorvastatin for 3-4 weeks and monitor for changes. If no changes you could resume Atorvastatin 1/2 tab (20 mg) instead 40 mg.  If you need refills please call your pharmacy. Do not use My Chart to request refills or for acute issues that need immediate attention.    Please be sure medication list is accurate. If a new problem present, please set up appointment sooner than planned today.

## 2019-11-12 NOTE — Assessment & Plan Note (Signed)
Problem is well controlled. No changes in current management.

## 2019-11-12 NOTE — Assessment & Plan Note (Signed)
BP re-checked x 2 140/70, 145/75. BP readings at home mildly elevated, so we will increase dose of Amlodipine from 2.5 mg to 5 mg. No changes in Benazepril. Continue monitoring BP at home and low salt diet.

## 2019-11-27 NOTE — Chronic Care Management (AMB) (Signed)
Chronic Care Management Pharmacy  Name: Alexa Brown  MRN: [id number] DOB: 1940-09-14   Chief Complaint/ HPI  Alexa Brown,  79 y.o. , female presents for their Initial CCM visit with the clinical pharmacist In office. She presents today with her daughter, Alexa Brown, who lives with her mother and helps drives her to doctor's visits, stores, helps her with medications, and cooks for her.   PCP : Martinique, Dolores G, MD Patient Care Team: Martinique, Skyra G, MD as PCP - General (Family Medicine) Germaine Pomfret, [facility] as Pharmacist (Pharmacist)  Their chronic conditions include: Hypertension, Hyperlipidemia, Diabetes, GERD, Chronic Kidney Disease, Depression, Osteoarthritis and Gout   Office Visits: 11/12/19: Patient presented to Dr. Martinique for follow-up. Amlodipine increased to 5 mg daily.  08/07/19: Patient presented to Dr. Martinique for follow-up. BP in clinic 150/90, started on amlodipine 2.5 mg daily. Hydrocodone, tramadol stopped (not taking).   Consult Visit: 10/17/19: Patient presented to Dr. Ander Slade (Ophthamology) for glaucoma.   No Known Allergies  Medications: Outpatient Encounter Medications as of 12/03/2019  Medication Sig  . allopurinol (ZYLOPRIM) 100 MG tablet Take 1 tablet (100 mg total) by mouth 2 (two) times daily.  Marland Kitchen amLODipine (NORVASC) 5 MG tablet TAKE 1/2 TABLET BY MOUTH DAILY  . ascorbic acid (VITAMIN C) 250 MG CHEW Chew 500 mg by mouth daily.  . benazepril (LOTENSIN) 20 MG tablet TAKE ONE TABLET BY MOUTH DAILY  . buPROPion (WELLBUTRIN) 75 MG tablet Take 1 tablet (75 mg total) by mouth 2 (two) times daily.  . calcium carbonate (OSCAL) 1500 (600 Ca) MG TABS tablet Take 600 mg of elemental calcium by mouth at bedtime.   . Cholecalciferol (VITAMIN D3) 5000 units CAPS Take 5,000 Units by mouth at bedtime.   . clopidogrel (PLAVIX) 75 MG tablet TAKE ONE TABLET BY MOUTH DAILY (Patient taking differently: Take 75 mg by mouth daily. )  . DULoxetine (CYMBALTA) 60  MG capsule TAKE ONE CAPSULE BY MOUTH DAILY (Patient taking differently: Take 60 mg by mouth daily. )  . fluticasone (FLONASE) 50 MCG/ACT nasal spray Place 2 sprays into both nostrils as needed for allergies.   Marland Kitchen L-Lysine 500 MG CAPS Take 500 mg by mouth at bedtime.   . Melatonin 10 MG TBCR Take 10 mg by mouth at bedtime.   . Multiple Vitamin (MULTIVITAMIN) capsule Take 1 capsule by mouth daily.   . pantoprazole (PROTONIX) 20 MG tablet TAKE ONE TABLET BY MOUTH DAILY  (NEW DOSE)  . polyethylene glycol (MIRALAX / GLYCOLAX) 17 g packet Take 17 g by mouth as needed.  Marland Kitchen PRESCRIPTION MEDICATION Take 1 capsule by mouth 2 (two) times daily. New remedy vitamin mixture for neuropathy  . Triamcinolone Acetonide (TRIAMCINOLONE 0.1 % CREAM : EUCERIN) CREA Apply 1 application topically 2 (two) times daily as needed for rash or itching.  Marland Kitchen ACCU-CHEK AVIVA PLUS test strip USE TO TEST BLOOD SUGAR ONCE DAILY  . Accu-Chek Softclix Lancets lancets Use to check blood sugar once daily  . atorvastatin (LIPITOR) 40 MG tablet Take 1 tablet (40 mg total) by mouth daily. (Patient not taking: Reported on 12/03/2019)  . colchicine 0.6 MG tablet TAKE ONE TABLET BY MOUTH TWICE A DAY  . diclofenac sodium (VOLTAREN) 1 % GEL Place 2 g onto the skin 4 (four) times daily as needed (pain).  (Patient not taking: Reported on 12/03/2019)  . Lancet Devices (ACCU-CHEK SOFTCLIX) lancets Use to test blood sugar once a day.  . Magnesium Citrate 100 MG TABS  (Patient  not taking: Reported on 12/03/2019)  . Omega-3 Fatty Acids (FISH OIL) 1000 MG CAPS Take 1,000 mg by mouth daily.   . vitamin E 400 UNIT capsule Take 400 Units by mouth daily.   . [DISCONTINUED] amLODipine (NORVASC) 5 MG tablet Take 1 tablet (5 mg total) by mouth at bedtime.   No facility-administered encounter medications on file as of 12/03/2019.   Current Diagnosis/Assessment:  SDOH Interventions     Most Recent Value  SDOH Interventions  Financial Strain Interventions  Intervention Not Indicated  Transportation Interventions Intervention Not Indicated      Goals Addressed            This Visit's Progress   . Chronic Care Management       CARE PLAN ENTRY (see longitudinal plan of care for additional care plan information)  Current Barriers:  . Chronic Disease Management support, education, and care coordination needs related to Hypertension, Hyperlipidemia, Diabetes, GERD, Chronic Kidney Disease, Depression, Osteoarthritis and Gout    Hypertension BP Readings from Last 3 Encounters:  11/12/19 128/80  08/07/19 (!) 150/90  07/23/19 129/74   . Pharmacist Clinical Goal(s): o Over the next 90 days, patient will work with PharmD and providers to maintain BP goal <140/90 . Current regimen:   Amlodipine 5 mg daily  Benazepril 20 mg daily . Interventions: o Discussed low salt diet and exercising as tolerated extensively o Recommend switching to Lotrel (Amlodipine + Benazepril) 5-40 mg daily . Patient self care activities - Over the next 90 days, patient will: o Check blood pressure 3-5 times weekly document, and provide at future appointments o Ensure daily salt intake < 2300 mg/day  Hyperlipidemia Lab Results  Component Value Date/Time   LDLCALC 68 12/18/2018 09:23 AM   . Pharmacist Clinical Goal(s): o Over the next 90 days, patient will work with PharmD and providers to maintain LDL goal < 70 . Current regimen:  o None . Interventions: o Discussed low cholesterol diet and exercising as tolerated extensively o Recommend rechecking cholesterol panel at next visit with Dr. Martinique  Diabetes Lab Results  Component Value Date/Time   HGBA1C 5.9 (A) 07/02/2019 11:12 AM   HGBA1C 5.9 12/18/2018 09:23 AM   HGBA1C 6.0 01/22/2018 09:17 AM   HGBA1C 5.7 07/27/2015 12:00 AM   HGBA1C 5.9 03/27/2015 12:00 AM   . Pharmacist Clinical Goal(s): o Over the next 90 days, patient will work with PharmD and providers to maintain A1c goal <6.5% . Current  regimen:  o None . Interventions: o Discussed carbohydrate counting and exercising as tolerated extensively . Patient self care activities - Over the next 90 days, patient will: o Check blood sugar once daily, document, and provide at future appointments o Contact provider with any episodes of hypoglycemia  Mood and Sleep . Pharmacist Clinical Goal(s) o Over the next 90 days, patient will work with PharmD and providers to maintain stable mood and improve sleep quality  . Current regimen:   Wellbutrin 75 mg twice daily   Cymbalta 60 mg daily  . Interventions: o Recommend changing Wellbutrin to Wellbutrin XR 300 mg daily  o Counseled on proper sleep hygiene   Medication management . Pharmacist Clinical Goal(s): o Over the next 90 days, patient will work with PharmD and providers to maintain optimal medication adherence . Current pharmacy: [facility] . Interventions o Comprehensive medication review performed. o Continue current medication management strategy . Patient self care activities - Over the next 90 days, patient will: o Take medications as  prescribed o Report any questions or concerns to PharmD and/or provider(s)       Diabetes   A1c goal <7%  Recent Relevant Labs: Lab Results  Component Value Date/Time   HGBA1C 5.9 (A) 07/02/2019 11:12 AM   HGBA1C 5.9 12/18/2018 09:23 AM   HGBA1C 6.0 01/22/2018 09:17 AM   HGBA1C 5.7 07/27/2015 12:00 AM   HGBA1C 5.9 03/27/2015 12:00 AM   GFR 56.34 (L) 08/07/2019 11:14 AM   GFR 53.14 (L) 07/02/2019 08:58 AM   MICROALBUR 0.8 01/01/2019 10:56 AM   MICROALBUR <0.7 07/24/2017 12:21 PM    Last diabetic Eye exam: No results found for: HMDIABEYEEXA  Last diabetic Foot exam: No results found for: HMDIABFOOTEX   Checking BG: Daily  Recent FBG Readings: 177,157,181,142,134,153 Recent pre-meal BG readings: n/a Recent 2hr PP BG readings:  n/a Recent HS BG readings: n/a  Patient has failed these meds in past:  Invokana Patient is currently controlled on the following medications: . none  We discussed: Patient has been more lax lately with diet, reports eating more sweets than normal. Emphasized the importance of moderation and carb counting to maintain blood sugar control.    Plan  Continue control with diet and exercise  Hypertension   BP goal is:  <140/90  Office blood pressures are  BP Readings from Last 3 Encounters:  11/12/19 128/80  08/07/19 (!) 150/90  07/23/19 129/74   CMP Latest Ref Rng & Units 08/07/2019 07/02/2019 12/18/2018  Glucose 70 - 99 mg/dL 126(H) 177(H) 111(H)  BUN 6 - 23 mg/dL '18 17 15  ' Creatinine 9.51 - 1.20 mg/dL 0.96 1.01 0.93  Sodium 135 - 145 mEq/L 140 140 141  Potassium 3.5 - 5.1 mEq/L 4.7 4.0 3.7  Chloride 96 - 112 mEq/L 104 103 106  CO2 19 - 32 mEq/L '29 28 27  ' Calcium 8.4 - 10.5 mg/dL 10.1 9.9 9.7  Total Protein 6.0 - 8.3 g/dL - - 6.7  Total Bilirubin 0.2 - 1.2 mg/dL - - 0.6  Alkaline Phos 39 - 117 U/L - - 60  AST 0 - 37 U/L - - 22  ALT 0 - 35 U/L - - 17     Patient checks BP at home 3-5x per week Patient home BP readings are ranging: 160/73, 157/85, 157/79   Patient has failed these meds in the past: n/a Patient is currently uncontrolled on the following medications:  . Amlodipine 5 mg daily . Benazepril 20 mg daily   Patient reports tolerating dose increase of amlodipine well, denies any peripheral edema or dizziness. She is concerned that her blood pressures are unchanged since starting that medication.   She does not follow a strict diet, but mostly eats her daughters home cooking (does not add salt) and she does not salt her food. She may have 1 cup of coffee with breakfast.   She gets regular physical activity and does chair yoga (30 min) and walking around her neighborhood (30 minutes) 3-4 times weekly.   Patient is concerned about being on too many medications and was hesitant about the idea about adding a third blood pressure medication at  this time.   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  LDL goal < 70  Lipid Panel     Component Value Date/Time   CHOL 160 12/18/2018 0923   TRIG 83.0 12/18/2018 0923  HDL 75.70 12/18/2018 0923   LDLCALC 68 12/18/2018 0923    Hepatic Function Latest Ref Rng & Units 12/18/2018 01/22/2018 01/23/2017  Total Protein 6.0 - 8.3 g/dL 6.7 6.7 6.7  Albumin 3.5 - 5.2 g/dL 4.2 4.2 4.3  AST 0 - 37 U/L '22 22 22  ' ALT 0 - 35 U/L '17 18 23  ' Alk Phosphatase 39 - 117 U/L 60 63 62  Total Bilirubin 0.2 - 1.2 mg/dL 0.6 0.5 0.4     The ASCVD Risk score ([place]., et al., 2013) failed to calculate for the following reasons:   The patient has a prior MI or stroke diagnosis   Patient has failed these meds in past: Atorvastatin 40 mg (foot cramping, fear of side effects) Patient is currently controlled on the following medications:  . Clopidogrel 75 mg daily   We discussed: Stopped atorvastatin  ~11/19/19 due to concerns of side effects, although she had difficulty verbalizing them. She read multiple articles online about potential side effects of statins and was concerned it may have been worsening her foot cramping. She feels it has improved somewhat since stopping atorvastatin.   Discussed the importance of statin therapy for secondary stroke prevention. She verbalized her understanding, but wanted to wait until her follow-up lipid panel to decide if she should try a second statin. She states she has tried other statins in the past, but does not remember which ones.   Plan  Recommend follow-up lipid panel at PCP visit in November. Would recommend starting rosuvastatin 10 or 20 mg at that time depending on LDL.   Gout   Uric Acid, Serum  Date Value Ref Range Status  12/18/2018 5.7 2.4 - 7.0 mg/dL Final     Goal Uric  Acid < 6 mg/dL   Medications that may increase uric acid levels: None  Last gout flare: Many years ago  Patient has failed these meds in past: n/a Patient is currently controlled on the following medications:  . Allopurinol 100 mg twice daily   We discussed:  Counseled patient on low purine diet plan. Counseled patient to reduce consumption of high-fructose corn syrup, sweetened soft drinks, fruit juices, meat, and seafood.  Plan  Continue current medications  Depression / Anxiety   PHQ9 Score:  PHQ9 SCORE ONLY 08/07/2019 01/30/2019 09/26/2017  PHQ-9 Total Score '20 1 12   ' GAD7 Score: GAD 7 : Generalized Anxiety Score 08/07/2019  Nervous, Anxious, on Edge 1  Control/stop worrying 1  Worry too much - different things 1  Trouble relaxing 1  Restless 1  Easily annoyed or irritable 1  Afraid - awful might happen 1  Total GAD 7 Score 7  Anxiety Difficulty Somewhat difficult    Patient has failed these meds in past: n/a Patient is currently uncontrolled on the following medications:  . Wellbutrin 75 mg twice daily  . Cymbalta 60 mg daily   We discussed:  Patient feels that her mood has not been well controlled. She often struggles with anxiety, nervousness, and racing thoughts. She is worried about   Plan  Recommend stopping Bupropion (ineffective)  Recommend starting Bupropion XR 300 mg daily to reduce pill burden, lessen chance of side effects   Bone Health    Last DEXA Scan: 11/17/16   T-Score femoral neck: -0.5  T-Score total hip: n/a  T-Score lumbar spine: +0.0  T-Score forearm radius: n/a  10-year probability of major osteoporotic fracture: n/a  10-year probability of hip fracture: n/a  Vit D, 25-Hydroxy  Date Value Ref Range Status  07/21/2017 67 30 - 100 ng/mL Final    Comment:    Vitamin D Status         25-OH Vitamin D: . Deficiency:                    <20 ng/mL Insufficiency:             20 - 29 ng/mL Optimal:                 > or = 30 ng/mL . For  25-OH Vitamin D testing on patients on  D2-supplementation and patients for whom quantitation  of D2 and D3 fractions is required, the QuestAssureD(TM) 25-OH VIT D, (D2,D3), LC/MS/MS is recommended: order  code [id number] (patients >39yr). . For more information on this test, go to: http://education.questdiagnostics.com/faq/FAQ163 (This link is being provided for  informational/educational purposes only.)      Patient is not a candidate for pharmacologic treatment  Patient has failed these meds in past: n/a Patient is currently controlled on the following medications:  . Calcium Carbonate 600 mg QHS + 800 units of D  . Vitamin D3 5000 units daily   We discussed:  Recommend [phone] units of vitamin D daily. Recommend 1200 mg of calcium daily from dietary and supplemental sources.  Plan  Recommend stopping Vitamin D 5000 units due to adequate coverage from calcium + D supplement.   GERD   Patient denies dysphagia, heartburn or nausea. Expresses understanding to avoid triggers such as large meals and lying down after eating.  Currently controlled on: . Pantoprazole 20 mg daily   Plan   Continue current medication.  Insomnia    Patient has failed these meds in past: n/a Patient is currently uncontrolled on the following medications:  .Marland KitchenMelatonin 10 mg QHS (does not take fridays and saturdays)   We discussed:  Patient finds it hard to fall asleep, stating it often takes 1-2 hours to fall asleep. Discussed proper sleep hygiene.   Plan  Continue current medications  Misc / OTC    . Voltaren 1% gel  . Flonase 50 mcg/act 2 spray daily PRN  . L-Lysine 1000 mg daily (Cold sores)  . Magnesium Citrate 100 mg  . Miralax 17 g daily PRN . Triamcinolone cream . Vitamin E 400 units daily  . NeuRemedy (Fenfotiamine 300 mg 1 tablet twice daily)  . Equate Womens 50+ multivitamin daily . Vitamin C 250 mg 2 chewables daily  Vaccines   Reviewed and discussed patient's vaccination  history.    Immunization History  Administered Date(s) Administered  . Hepatitis B, adult 01/22/2018  . Influenza Whole 02/01/2016  . Influenza, High Dose Seasonal PF 01/14/2016  . Influenza-Unspecified 01/12/2017, 12/26/2017  . PFIZER SARS-COV-2 Vaccination 05/03/2019, 05/28/2019  . Tdap 12/24/2015  . Zoster Recombinat (Shingrix) 08/16/2016, 01/12/2017    Medication Management   Pt uses [facility] Uses pill box? Yes, daughter fills for her.  Plan  Continue current medication management strategy  Follow up: 3 month phone visit  [facility] [place]

## 2019-11-29 ENCOUNTER — Telehealth: Payer: Self-pay

## 2019-11-29 NOTE — Progress Notes (Signed)
Chronic Care Management Pharmacy Assistant   Name: Alexa Brown  MRN: [id number] DOB: 03/06/1941  Reason for Encounter: Medication Review/initial question for initial visit with clinical pharmacist.    PCP : Martinique, Anicia G, MD  Allergies:  No Known Allergies  Medications: Outpatient Encounter Medications as of 11/29/2019  Medication Sig  . ACCU-CHEK AVIVA PLUS test strip USE TO TEST BLOOD SUGAR ONCE DAILY  . Accu-Chek Softclix Lancets lancets Use to check blood sugar once daily  . allopurinol (ZYLOPRIM) 100 MG tablet Take 1 tablet (100 mg total) by mouth 2 (two) times daily.  Marland Kitchen amLODipine (NORVASC) 5 MG tablet Take 1 tablet (5 mg total) by mouth at bedtime.  Marland Kitchen atorvastatin (LIPITOR) 40 MG tablet Take 1 tablet (40 mg total) by mouth daily.  . benazepril (LOTENSIN) 20 MG tablet TAKE ONE TABLET BY MOUTH DAILY  . buPROPion (WELLBUTRIN) 75 MG tablet Take 1 tablet (75 mg total) by mouth 2 (two) times daily.  . calcium carbonate (OSCAL) 1500 (600 Ca) MG TABS tablet Take 600 mg of elemental calcium by mouth at bedtime.   . Cholecalciferol (VITAMIN D3) 5000 units CAPS Take 5,000 Units by mouth at bedtime.   . clopidogrel (PLAVIX) 75 MG tablet TAKE ONE TABLET BY MOUTH DAILY (Patient taking differently: Take 75 mg by mouth daily. )  . colchicine 0.6 MG tablet TAKE ONE TABLET BY MOUTH TWICE A DAY  . diclofenac sodium (VOLTAREN) 1 % GEL Place 2 g onto the skin 4 (four) times daily as needed (pain).   . DULoxetine (CYMBALTA) 60 MG capsule TAKE ONE CAPSULE BY MOUTH DAILY (Patient taking differently: Take 60 mg by mouth daily. )  . fluticasone (FLONASE) 50 MCG/ACT nasal spray Place 2 sprays into both nostrils as needed for allergies.   Marland Kitchen L-Lysine 500 MG CAPS Take 500 mg by mouth at bedtime.   Elmore Guise Devices (ACCU-CHEK SOFTCLIX) lancets Use to test blood sugar once a day.  . Magnesium Citrate 100 MG TABS   . Melatonin 10 MG TBCR Take 10 mg by mouth at bedtime.   . Multiple Vitamin  (MULTIVITAMIN) capsule Take 1 capsule by mouth daily.   . Omega-3 Fatty Acids (FISH OIL) 1000 MG CAPS Take 1,000 mg by mouth daily.   . pantoprazole (PROTONIX) 20 MG tablet TAKE ONE TABLET BY MOUTH DAILY  (NEW DOSE)  . polyethylene glycol (MIRALAX / GLYCOLAX) 17 g packet Take 17 g by mouth as needed.  Marland Kitchen PRESCRIPTION MEDICATION Take 1 capsule by mouth 2 (two) times daily. New remedy vitamin mixture for neuropathy  . Triamcinolone Acetonide (TRIAMCINOLONE 0.1 % CREAM : EUCERIN) CREA Apply 1 application topically 2 (two) times daily as needed for rash or itching.  . vitamin E 400 UNIT capsule Take 400 Units by mouth daily.    No facility-administered encounter medications on file as of 11/29/2019.    Current Diagnosis: Patient Active Problem List   Diagnosis Date Noted  . Elbow dislocation 07/02/2019  . Generalized osteoarthritis of multiple sites 06/26/2018  . Gout, arthropathy 01/22/2018  . Lower back pain 09/22/2017  . Unstable gait 09/22/2017  . Seborrheic dermatitis 01/23/2017  . Seborrheic keratosis 09/27/2016  . CKD (chronic kidney disease), stage III ([place]) 05/28/2016  . GERD (gastroesophageal reflux disease) 02/25/2016  . CVA (cerebral vascular accident) (HCC)-No residual deficit 02/12/2016  . Glaucoma 02/12/2016  . Depression, major, recurrent ([place]) 02/12/2016  . Dyslipidemia 02/12/2016  . OSA (obstructive sleep apnea) 02/12/2016  . Vitamin D deficiency 01/14/2016  .  DM (diabetes mellitus), type 2 with neurological complications ([place]) 38/25/0539  . Systolic hypertension with cerebrovascular disease 01/14/2016  . Peripheral neuropathy 01/14/2016     Follow-Up:  Pharmacist Review   Have you seen any other providers since your last visit? no Any changes in your medications or health? no Any side effects from any medications?    Patient states she had a reaction to a statin medication, PCP  stop the medication.Patient complains the side effect was having weak legs with no  strength.  Do you have an symptoms or problems not managed by your medications? no Any concerns about your health right now? Yes  Patient states she has concerns about her stomach.Patient reports a little pain, but denies diarrhea and constipation.  Has your provider asked that you check blood pressure, blood sugar, or follow special diet at home? Yes  Patient states she checks her blood pressure at home.   On 11/21/19 it was 160/73   On 11/25/19 it was 157/85   On 11/14/19 it was 157/79  Patient reports she checks her blood sugar at home.   On 11/27/19 it was 177   On 11/25/19 it was 157   On 11/19/19 it was 181   On 11/18/19 it was 142   On 11/17/19 it was 134   On 11/16/19 it was 153  Patient states her daughter cooks at home with little salt intake. Do you get any type of exercise on a regular basis?   Patient states she does not exercise on a daily basis, but does yoga in her chair.Patient has problem with her balance, so she limits the amount of exercise. Can you think of a goal you would like to reach for your health?   Patient states she would like to feel better.She states her "mood is low". Do you have any problems getting your medications? None ID  Is there anything that you would like to discuss during the appointment?   Patient states she would like to discuss her stomach problem, and go over all of her medication. She feels like she is over loaded with medication and would like to stop some if possible.  Please bring medications and supplements to appointment  [place] Pharmacist Assistant [phone]

## 2019-12-01 ENCOUNTER — Other Ambulatory Visit: Payer: Self-pay | Admitting: Family Medicine

## 2019-12-01 DIAGNOSIS — F332 Major depressive disorder, recurrent severe without psychotic features: Secondary | ICD-10-CM

## 2019-12-01 DIAGNOSIS — E1149 Type 2 diabetes mellitus with other diabetic neurological complication: Secondary | ICD-10-CM

## 2019-12-01 DIAGNOSIS — I674 Hypertensive encephalopathy: Secondary | ICD-10-CM

## 2019-12-03 ENCOUNTER — Ambulatory Visit: Payer: Medicare Other

## 2019-12-03 ENCOUNTER — Other Ambulatory Visit: Payer: Self-pay

## 2019-12-03 DIAGNOSIS — I674 Hypertensive encephalopathy: Secondary | ICD-10-CM

## 2019-12-03 DIAGNOSIS — E1149 Type 2 diabetes mellitus with other diabetic neurological complication: Secondary | ICD-10-CM

## 2019-12-03 NOTE — Telephone Encounter (Signed)
-----   Message from Germaine Pomfret, [facility] sent at 12/03/2019  8:18 AM EDT ----- Regarding: CCM Referral [name],  Can you please place a referral to chronic care management for this patient?  Thanks, Doristine Section Clinical Pharmacist [facility] [place]

## 2019-12-05 NOTE — Patient Instructions (Signed)
Visit Information It was great speaking with you today!  Please let me know if you have any questions about our visit.  Goals Addressed            This Visit's Progress   . Chronic Care Management       CARE PLAN ENTRY (see longitudinal plan of care for additional care plan information)  Current Barriers:  . Chronic Disease Management support, education, and care coordination needs related to Hypertension, Hyperlipidemia, Diabetes, GERD, Chronic Kidney Disease, Depression, Osteoarthritis and Gout    Hypertension BP Readings from Last 3 Encounters:  11/12/19 128/80  08/07/19 (!) 150/90  07/23/19 129/74   . Brown Alexa Goal(s): o Over the next 90 days, patient will work with PharmD and providers to maintain BP goal <140/90 . Current regimen:   Amlodipine 5 mg daily  Benazepril 20 mg daily . Interventions: o Discussed low salt diet and exercising as tolerated extensively o Recommend switching to Lotrel (Amlodipine + Benazepril) 5-40 mg daily . Patient self care activities - Over the next 90 days, patient will: o Check blood pressure 3-5 times weekly document, and provide at future appointments o Ensure daily salt intake < 2300 mg/day  Hyperlipidemia Lab Results  Component Value Date/Time   LDLCALC 68 12/18/2018 09:23 AM   . Brown Alexa Goal(s): o Over the next 90 days, patient will work with PharmD and providers to maintain LDL goal < 70 . Current regimen:  o None . Interventions: o Discussed low cholesterol diet and exercising as tolerated extensively o Recommend rechecking cholesterol panel at next visit with Dr. Martinique  Diabetes Lab Results  Component Value Date/Time   HGBA1C 5.9 (A) 07/02/2019 11:12 AM   HGBA1C 5.9 12/18/2018 09:23 AM   HGBA1C 6.0 01/22/2018 09:17 AM   HGBA1C 5.7 07/27/2015 12:00 AM   HGBA1C 5.9 03/27/2015 12:00 AM   . Brown Alexa Goal(s): o Over the next 90 days, patient will work with PharmD and providers to  maintain A1c goal <6.5% . Current regimen:  o None . Interventions: o Discussed carbohydrate counting and exercising as tolerated extensively . Patient self care activities - Over the next 90 days, patient will: o Check blood sugar once daily, document, and provide at future appointments o Contact provider with any episodes of hypoglycemia  Mood and Sleep . Brown Alexa Goal(s) o Over the next 90 days, patient will work with PharmD and providers to maintain stable mood and improve sleep quality  . Current regimen:   Wellbutrin 75 mg twice daily   Cymbalta 60 mg daily  . Interventions: o Recommend changing Wellbutrin to Wellbutrin XR 300 mg daily  o Counseled on proper sleep hygiene   Medication management . Brown Alexa Goal(s): o Over the next 90 days, patient will work with PharmD and providers to maintain optimal medication adherence . Current pharmacy: [facility] . Interventions o Comprehensive medication review performed. o Continue current medication management strategy . Patient self care activities - Over the next 90 days, patient will: o Take medications as prescribed o Report any questions or concerns to PharmD and/or provider(s)       Alexa Brown was given information about Chronic Care Management services today including:  1. CCM service includes personalized support from designated Alexa staff supervised by her physician, including individualized plan of care and coordination with other care providers 2. 24/7 contact phone numbers for assistance for urgent and routine care needs. 3. Standard insurance, coinsurance, copays and deductibles apply for chronic  care management only during months in which we provide at least 20 minutes of these services. Most insurances cover these services at 100%, however patients may be responsible for any copay, coinsurance and/or deductible if applicable. This service may help you avoid the need for  more expensive face-to-face services. 4. Only one practitioner may furnish and bill the service in a calendar month. 5. The patient may stop CCM services at any time (effective at the end of the month) by phone call to the office staff.  Patient agreed to services and verbal consent obtained.   The patient verbalized understanding of instructions provided today and agreed to receive a mailed copy of patient instruction and/or educational materials. Telephone follow up appointment with pharmacy team member scheduled for:   Alexa Brown [facility]

## 2020-01-10 ENCOUNTER — Telehealth: Payer: Self-pay

## 2020-01-10 NOTE — Progress Notes (Signed)
Chronic Care Management Pharmacy Assistant   Name: Alexa Brown  MRN: [id number] DOB: [birth year]  Reason for Encounter:Hypertension Disease State Call  PCP : Martinique, Chandani G, MD  Allergies:  No Known Allergies  Medications: Outpatient Encounter Medications as of 01/10/2020  Medication Sig  . ACCU-CHEK AVIVA PLUS test strip USE TO TEST BLOOD SUGAR ONCE DAILY  . Accu-Chek Softclix Lancets lancets Use to check blood sugar once daily  . allopurinol (ZYLOPRIM) 100 MG tablet Take 1 tablet (100 mg total) by mouth 2 (two) times daily.  Marland Kitchen amLODipine (NORVASC) 5 MG tablet TAKE 1/2 TABLET BY MOUTH DAILY  . ascorbic acid (VITAMIN C) 250 MG CHEW Chew 500 mg by mouth daily.  Marland Kitchen atorvastatin (LIPITOR) 40 MG tablet Take 1 tablet (40 mg total) by mouth daily. (Patient not taking: Reported on 12/03/2019)  . benazepril (LOTENSIN) 20 MG tablet TAKE ONE TABLET BY MOUTH DAILY  . buPROPion (WELLBUTRIN) 75 MG tablet Take 1 tablet (75 mg total) by mouth 2 (two) times daily.  . calcium carbonate (OSCAL) 1500 (600 Ca) MG TABS tablet Take 600 mg of elemental calcium by mouth at bedtime.   . Cholecalciferol (VITAMIN D3) 5000 units CAPS Take 5,000 Units by mouth at bedtime.   . clopidogrel (PLAVIX) 75 MG tablet TAKE ONE TABLET BY MOUTH DAILY (Patient taking differently: Take 75 mg by mouth daily. )  . colchicine 0.6 MG tablet TAKE ONE TABLET BY MOUTH TWICE A DAY  . diclofenac sodium (VOLTAREN) 1 % GEL Place 2 g onto the skin 4 (four) times daily as needed (pain).  (Patient not taking: Reported on 12/03/2019)  . DULoxetine (CYMBALTA) 60 MG capsule TAKE ONE CAPSULE BY MOUTH DAILY (Patient taking differently: Take 60 mg by mouth daily. )  . fluticasone (FLONASE) 50 MCG/ACT nasal spray Place 2 sprays into both nostrils as needed for allergies.   Marland Kitchen L-Lysine 500 MG CAPS Take 500 mg by mouth at bedtime.   Elmore Guise Devices (ACCU-CHEK SOFTCLIX) lancets Use to test blood sugar once a day.  . Magnesium Citrate 100 MG  TABS  (Patient not taking: Reported on 12/03/2019)  . Melatonin 10 MG TBCR Take 10 mg by mouth at bedtime.   . Multiple Vitamin (MULTIVITAMIN) capsule Take 1 capsule by mouth daily.   . Omega-3 Fatty Acids (FISH OIL) 1000 MG CAPS Take 1,000 mg by mouth daily.   . pantoprazole (PROTONIX) 20 MG tablet TAKE ONE TABLET BY MOUTH DAILY  (NEW DOSE)  . polyethylene glycol (MIRALAX / GLYCOLAX) 17 g packet Take 17 g by mouth as needed.  Marland Kitchen PRESCRIPTION MEDICATION Take 1 capsule by mouth 2 (two) times daily. New remedy vitamin mixture for neuropathy  . Triamcinolone Acetonide (TRIAMCINOLONE 0.1 % CREAM : EUCERIN) CREA Apply 1 application topically 2 (two) times daily as needed for rash or itching.  . vitamin E 400 UNIT capsule Take 400 Units by mouth daily.    No facility-administered encounter medications on file as of 01/10/2020.    Current Diagnosis: Patient Active Problem List   Diagnosis Date Noted  . Elbow dislocation 07/02/2019  . Generalized osteoarthritis of multiple sites 06/26/2018  . Gout, arthropathy 01/22/2018  . Lower back pain 09/22/2017  . Unstable gait 09/22/2017  . Seborrheic dermatitis 01/23/2017  . Seborrheic keratosis 09/27/2016  . CKD (chronic kidney disease), stage III ([place]) 05/28/2016  . GERD (gastroesophageal reflux disease) 02/25/2016  . CVA (cerebral vascular accident) (HCC)-No residual deficit 02/12/2016  . Glaucoma 02/12/2016  . Depression, major,  recurrent ([place]) 02/12/2016  . Dyslipidemia 02/12/2016  . OSA (obstructive sleep apnea) 02/12/2016  . Vitamin D deficiency 01/14/2016  . DM (diabetes mellitus), type 2 with neurological complications ([place]) 38/45/3646  . Systolic hypertension with cerebrovascular disease 01/14/2016  . Peripheral neuropathy 01/14/2016    Follow-Up:  Pharmacist Review   Reviewed chart prior to disease state call. Spoke with patient regarding BP  Recent Office Vitals: BP Readings from Last 3 Encounters:  11/12/19 128/80  08/07/19 (!)  150/90  07/23/19 129/74   Pulse Readings from Last 3 Encounters:  11/12/19 84  08/07/19 86  07/23/19 93    Wt Readings from Last 3 Encounters:  11/12/19 162 lb (73.5 kg)  08/07/19 159 lb 6 oz (72.3 kg)  07/23/19 158 lb (71.7 kg)     Kidney Function Lab Results  Component Value Date/Time   CREATININE 0.96 08/07/2019 11:14 AM   CREATININE 1.01 07/02/2019 08:58 AM   CREATININE 0.92 07/21/2017 04:02 PM   GFR 56.34 (L) 08/07/2019 11:14 AM   GFRNONAA 55 (L) 01/30/2018 02:11 PM   GFRAA >60 01/30/2018 02:11 PM    BMP Latest Ref Rng & Units 08/07/2019 07/02/2019 12/18/2018  Glucose 70 - 99 mg/dL 126(H) 177(H) 111(H)  BUN 6 - 23 mg/dL 18 17 15   Creatinine 0.40 - 1.20 mg/dL 0.96 1.01 0.93  BUN/Creat Ratio 6 - 22 (calc) - - -  Sodium 135 - 145 mEq/L 140 140 141  Potassium 3.5 - 5.1 mEq/L 4.7 4.0 3.7  Chloride 96 - 112 mEq/L 104 103 106  CO2 19 - 32 mEq/L 29 28 27   Calcium 8.4 - 10.5 mg/dL 10.1 9.9 9.7    . Current antihypertensive regimen:   Benazepril 20 mg daily  Patient states she is not taking amlodipine.Patient states she is unsure if she ever taken amlodipine.  . How often are you checking your Blood Pressure? daily . Current home BP readings:  o On 10/18 it was 145/84 o On 10/19 it was 132/77 o On 10/20 it was 128/73 o On 10/22 it was 166/91, Patient check her blood pressure again while on the phone and it was 165/93.Patient denies headache,dizziness,lightheadedness,Blurry vision. Patient states she feels good and she is  unsure why her blood pressure is so high. - Patient states she checks her blood pressure while sitting down on her left arm without talking. . What recent interventions/DTPs have been made by any provider to improve Blood Pressure control since last CPP Visit: None ID . Any recent hospitalizations or ED visits since last visit with CPP? No . What diet changes have been made to improve Blood Pressure Control?      Patient states her daughter cooks at home  with little salt intake.  Fruits+ Vegetables + peanut butter+Meat  . What exercise is being done to improve your Blood Pressure Control?   Patient states she does not exercise on a daily basis, but does yoga in her chair.Patient has problem with her balance, so she limits the amount of exercise.  Adherence Review: Is the patient currently on ACE/ARB medication? Yes Does the patient have >5 day gap between last estimated fill dates? Yes  [facility] Pharmacist Assistant [phone]    Maryjean Ka

## 2020-01-11 ENCOUNTER — Ambulatory Visit: Payer: Medicare Other | Attending: Internal Medicine

## 2020-01-11 DIAGNOSIS — Z23 Encounter for immunization: Secondary | ICD-10-CM

## 2020-01-11 NOTE — Progress Notes (Signed)
   Covid-19 Vaccination Clinic  Name:  Alexa Brown    MRN: [id number] DOB: 1940-09-24  01/11/2020  [name] was observed post Covid-19 immunization for 15 minutes without incident. She was provided with Vaccine Information Sheet and instruction to access the V-Safe system.   [name] was instructed to call 911 with any severe reactions post vaccine: Marland Kitchen Difficulty breathing  . Swelling of face and throat  . A fast heartbeat  . A bad rash all over body  . Dizziness and weakness

## 2020-02-05 ENCOUNTER — Other Ambulatory Visit: Payer: Self-pay

## 2020-02-05 ENCOUNTER — Ambulatory Visit (INDEPENDENT_AMBULATORY_CARE_PROVIDER_SITE_OTHER): Payer: Medicare Other

## 2020-02-05 DIAGNOSIS — Z0000 Encounter for general adult medical examination without abnormal findings: Secondary | ICD-10-CM

## 2020-02-05 NOTE — Patient Instructions (Addendum)
Alexa Brown , Thank you for taking time to come for your Medicare Wellness Visit. I appreciate your ongoing commitment to your health goals. Please review the following plan we discussed and let me know if I can assist you in the future.   Screening recommendations/referrals: Colonoscopy: Done 05/27/08 Mammogram: Done 03/08/19 Bone Density: Done 11/17/16 Recommended yearly ophthalmology/optometry visit for glaucoma screening and checkup Recommended yearly dental visit for hygiene and checkup  Vaccinations: Influenza vaccine: Done 12/13/19 Pneumococcal vaccine: Declined Tdap vaccine: Up to date Shingles vaccine: Completed 5/22 & 01/12/17   Covid-19:Completed 2/5, 3/2, & 01/11/20  Advanced directives: Pt will check with daughter   Conditions/risks identified: Stay healthy  Next appointment: Follow up in one year for your annual wellness visit    Preventive Care 77 Years and Older, Female Preventive care refers to lifestyle choices and visits with your health care provider that can promote health and wellness. What does preventive care include?  A yearly physical exam. This is also called an annual well check.  Dental exams once or twice a year.  Routine eye exams. Ask your health care provider how often you should have your eyes checked.  Personal lifestyle choices, including:  Daily care of your teeth and gums.  Regular physical activity.  Eating a healthy diet.  Avoiding tobacco and drug use.  Limiting alcohol use.  Practicing safe sex.  Taking low-dose aspirin every day.  Taking vitamin and mineral supplements as recommended by your health care provider. What happens during an annual well check? The services and screenings done by your health care provider during your annual well check will depend on your age, overall health, lifestyle risk factors, and family history of disease. Counseling  Your health care provider may ask you questions about  your:  Alcohol use.  Tobacco use.  Drug use.  Emotional well-being.  Home and relationship well-being.  Sexual activity.  Eating habits.  History of falls.  Memory and ability to understand (cognition).  Work and work Statistician.  Reproductive health. Screening  You may have the following tests or measurements:  Height, weight, and BMI.  Blood pressure.  Lipid and cholesterol levels. These may be checked every 5 years, or more frequently if you are over 77 years old.  Skin check.  Lung cancer screening. You may have this screening every year starting at age 77 if you have a 30-pack-year history of smoking and currently smoke or have quit within the past 15 years.  Fecal occult blood test (FOBT) of the stool. You may have this test every year starting at age 77.  Flexible sigmoidoscopy or colonoscopy. You may have a sigmoidoscopy every 5 years or a colonoscopy every 10 years starting at age 77.  Hepatitis C blood test.  Hepatitis B blood test.  Sexually transmitted disease (STD) testing.  Diabetes screening. This is done by checking your blood sugar (glucose) after you have not eaten for a while (fasting). You may have this done every 1-3 years.  Bone density scan. This is done to screen for osteoporosis. You may have this done starting at age 77.  Mammogram. This may be done every 1-2 years. Talk to your health care provider about how often you should have regular mammograms. Talk with your health care provider about your test results, treatment options, and if necessary, the need for more tests. Vaccines  Your health care provider may recommend certain vaccines, such as:  Influenza vaccine. This is recommended every year.  Tetanus, diphtheria, and acellular  pertussis (Tdap, Td) vaccine. You may need a Td booster every 10 years.  Zoster vaccine. You may need this after age 77.  Pneumococcal 13-valent conjugate (PCV13) vaccine. One dose is recommended  after age 77.  Pneumococcal polysaccharide (PPSV23) vaccine. One dose is recommended after age 77. Talk to your health care provider about which screenings and vaccines you need and how often you need them. This information is not intended to replace advice given to you by your health care provider. Make sure you discuss any questions you have with your health care provider. Document Released: 04/10/2015 Document Revised: 12/02/2015 Document Reviewed: 01/13/2015 Elsevier Interactive Patient Education  2017 [place] Prevention in the Home Falls can cause injuries. They can happen to people of all ages. There are many things you can do to make your home safe and to help prevent falls. What can I do on the outside of my home?  Regularly fix the edges of walkways and driveways and fix any cracks.  Remove anything that might make you trip as you walk through a door, such as a raised step or threshold.  Trim any bushes or trees on the path to your home.  Use bright outdoor lighting.  Clear any walking paths of anything that might make someone trip, such as rocks or tools.  Regularly check to see if handrails are loose or broken. Make sure that both sides of any steps have handrails.  Any raised decks and porches should have guardrails on the edges.  Have any leaves, snow, or ice cleared regularly.  Use sand or salt on walking paths during winter.  Clean up any spills in your garage right away. This includes oil or grease spills. What can I do in the bathroom?  Use night lights.  Install grab bars by the toilet and in the tub and shower. Do not use towel bars as grab bars.  Use non-skid mats or decals in the tub or shower.  If you need to sit down in the shower, use a plastic, non-slip stool.  Keep the floor dry. Clean up any water that spills on the floor as soon as it happens.  Remove soap buildup in the tub or shower regularly.  Attach bath mats securely with  double-sided non-slip rug tape.  Do not have throw rugs and other things on the floor that can make you trip. What can I do in the bedroom?  Use night lights.  Make sure that you have a light by your bed that is easy to reach.  Do not use any sheets or blankets that are too big for your bed. They should not hang down onto the floor.  Have a firm chair that has side arms. You can use this for support while you get dressed.  Do not have throw rugs and other things on the floor that can make you trip. What can I do in the kitchen?  Clean up any spills right away.  Avoid walking on wet floors.  Keep items that you use a lot in easy-to-reach places.  If you need to reach something above you, use a strong step stool that has a grab bar.  Keep electrical cords out of the way.  Do not use floor polish or wax that makes floors slippery. If you must use wax, use non-skid floor wax.  Do not have throw rugs and other things on the floor that can make you trip. What can I do with my stairs?  Do not leave any items on the stairs.  Make sure that there are handrails on both sides of the stairs and use them. Fix handrails that are broken or loose. Make sure that handrails are as long as the stairways.  Check any carpeting to make sure that it is firmly attached to the stairs. Fix any carpet that is loose or worn.  Avoid having throw rugs at the top or bottom of the stairs. If you do have throw rugs, attach them to the floor with carpet tape.  Make sure that you have a light switch at the top of the stairs and the bottom of the stairs. If you do not have them, ask someone to add them for you. What else can I do to help prevent falls?  Wear shoes that:  Do not have high heels.  Have rubber bottoms.  Are comfortable and fit you well.  Are closed at the toe. Do not wear sandals.  If you use a stepladder:  Make sure that it is fully opened. Do not climb a closed stepladder.  Make  sure that both sides of the stepladder are locked into place.  Ask someone to hold it for you, if possible.  Clearly mark and make sure that you can see:  Any grab bars or handrails.  First and last steps.  Where the edge of each step is.  Use tools that help you move around (mobility aids) if they are needed. These include:  Canes.  Walkers.  Scooters.  Crutches.  Turn on the lights when you go into a dark area. Replace any light bulbs as soon as they burn out.  Set up your furniture so you have a clear path. Avoid moving your furniture around.  If any of your floors are uneven, fix them.  If there are any pets around you, be aware of where they are.  Review your medicines with your doctor. Some medicines can make you feel dizzy. This can increase your chance of falling. Ask your doctor what other things that you can do to help prevent falls. This information is not intended to replace advice given to you by your health care provider. Make sure you discuss any questions you have with your health care provider. Document Released: 01/08/2009 Document Revised: 08/20/2015 Document Reviewed: 04/18/2014 Elsevier Interactive Patient Education  2017 [employer].

## 2020-02-05 NOTE — Progress Notes (Addendum)
Virtual Visit via Telephone Note  I connected with  Alexa Brown on 02/05/20 at  8:45 AM EST by telephone and verified that I am speaking with the correct person using two identifiers.  Medicare Annual Wellness visit completed telephonically due to Covid-19 pandemic.   Persons participating in this call: This Health Coach and this patient.   Location: Patient: Home Provider: Office   I discussed the limitations, risks, security and privacy concerns of performing an evaluation and management service by telephone and the availability of in person appointments. The patient expressed understanding and agreed to proceed.  Unable to perform video visit due to video visit attempted and failed and/or patient does not have video capability.   Some vital signs may be absent or patient reported.   Willette Brace, LPN    Subjective:   Alexa Brown is a 79 y.o. female who presents for Medicare Annual (Subsequent) preventive examination.  Review of Systems     Cardiac Risk Factors include: advanced age (>67men, >71 women);hypertension;dyslipidemia;diabetes mellitus     Objective:    There were no vitals filed for this visit. There is no height or weight on file to calculate BMI.  Advanced Directives 02/05/2020 01/30/2018 09/26/2017 09/22/2016 01/25/2016 12/23/2015  Does Patient Have a Medical Advance Directive? No No No No No No  Would patient like information on creating a medical advance directive? - No - Patient declined - Yes (MAU/Ambulatory/Procedural Areas - Information given) No - patient declined information No - patient declined information    Current Medications (verified) Outpatient Encounter Medications as of 02/05/2020  Medication Sig  . ACCU-CHEK AVIVA PLUS test strip USE TO TEST BLOOD SUGAR ONCE DAILY  . Accu-Chek Softclix Lancets lancets Use to check blood sugar once daily  . allopurinol (ZYLOPRIM) 100 MG tablet Take 1 tablet (100 mg total) by mouth 2  (two) times daily.  Marland Kitchen amLODipine (NORVASC) 5 MG tablet TAKE 1/2 TABLET BY MOUTH DAILY  . ascorbic acid (VITAMIN C) 250 MG CHEW Chew 500 mg by mouth daily.  . benazepril (LOTENSIN) 20 MG tablet TAKE ONE TABLET BY MOUTH DAILY  . buPROPion (WELLBUTRIN) 75 MG tablet Take 1 tablet (75 mg total) by mouth 2 (two) times daily.  . calcium carbonate (OSCAL) 1500 (600 Ca) MG TABS tablet Take 600 mg of elemental calcium by mouth at bedtime.   . Cholecalciferol (VITAMIN D3) 5000 units CAPS Take 5,000 Units by mouth at bedtime.   . clopidogrel (PLAVIX) 75 MG tablet TAKE ONE TABLET BY MOUTH DAILY (Patient taking differently: Take 75 mg by mouth daily. )  . colchicine 0.6 MG tablet TAKE ONE TABLET BY MOUTH TWICE A DAY  . diclofenac sodium (VOLTAREN) 1 % GEL Place 2 g onto the skin 4 (four) times daily as needed (pain).   . DULoxetine (CYMBALTA) 60 MG capsule TAKE ONE CAPSULE BY MOUTH DAILY (Patient taking differently: Take 60 mg by mouth daily. )  . L-Lysine 500 MG CAPS Take 500 mg by mouth at bedtime.   Elmore Guise Devices (ACCU-CHEK SOFTCLIX) lancets Use to test blood sugar once a day.  . Magnesium Citrate 100 MG TABS   . Melatonin 10 MG TBCR Take 10 mg by mouth at bedtime.   . Multiple Vitamin (MULTIVITAMIN) capsule Take 1 capsule by mouth daily.   . Omega-3 Fatty Acids (FISH OIL) 1000 MG CAPS Take 1,000 mg by mouth daily.   . pantoprazole (PROTONIX) 20 MG tablet TAKE ONE TABLET BY MOUTH DAILY  (NEW DOSE)  .  polyethylene glycol (MIRALAX / GLYCOLAX) 17 g packet Take 17 g by mouth as needed.  Marland Kitchen PRESCRIPTION MEDICATION Take 1 capsule by mouth 2 (two) times daily. New remedy vitamin mixture for neuropathy  . Triamcinolone Acetonide (TRIAMCINOLONE 0.1 % CREAM : EUCERIN) CREA Apply 1 application topically 2 (two) times daily as needed for rash or itching.  . vitamin E 400 UNIT capsule Take 400 Units by mouth daily.   . fluticasone (FLONASE) 50 MCG/ACT nasal spray Place 2 sprays into both nostrils as needed for  allergies.  (Patient not taking: Reported on 02/05/2020)  . [DISCONTINUED] atorvastatin (LIPITOR) 40 MG tablet Take 1 tablet (40 mg total) by mouth daily. (Patient not taking: Reported on 02/05/2020)   No facility-administered encounter medications on file as of 02/05/2020.    Allergies (verified) Patient has no known allergies.   History: Past Medical History:  Diagnosis Date  . Chicken pox   . Depression   . Diabetes mellitus without complication ([place])   . GERD (gastroesophageal reflux disease)   . Hyperlipidemia   . Hypertension   . Stroke ([place])   . UTI (urinary tract infection)    Past Surgical History:  Procedure Laterality Date  . ABDOMINAL HYSTERECTOMY    . APPENDECTOMY    . BREAST BIOPSY Left 2014  . BREAST SURGERY     biopsy   Family History  Problem Relation Age of Onset  . Hyperlipidemia Mother   . Heart disease Mother   . Stroke Mother   . Hypertension Mother   . Hyperlipidemia Father   . Heart disease Father   . Stroke Father   . Hypertension Father   . Asthma Father   . Parkinson's disease Sister    Social History   Socioeconomic History  . Marital status: Widowed    Spouse name: Not on file  . Number of children: Not on file  . Years of education: Not on file  . Highest education level: Not on file  Occupational History  . Occupation: N/A  Tobacco Use  . Smoking status: Never Smoker  . Smokeless tobacco: Never Used  Vaping Use  . Vaping Use: Never used  Substance and Sexual Activity  . Alcohol use: Yes    Comment: occassional wine  . Drug use: No  . Sexual activity: Not Currently  Other Topics Concern  . Not on file  Social History Narrative   Pt lives in single story home with her daughter   Has 2 children   Some college education   Retired Engineer, production for [employer]   Social Determinants of Health   Financial Resource Strain: [place]   . Difficulty of Paying Living Expenses: Not hard at all  Food Insecurity: No  Food Insecurity  . Worried About Charity fundraiser in the Last Year: Never true  . Ran Out of Food in the Last Year: Never true  Transportation Needs: No Transportation Needs  . Lack of Transportation (Medical): No  . Lack of Transportation (Non-Medical): No  Physical Activity: Insufficiently Active  . Days of Exercise per Week: 2 days  . Minutes of Exercise per Session: 30 min  Stress: No Stress Concern Present  . Feeling of Stress : Not at all  Social Connections: Moderately Integrated  . Frequency of Communication with Friends and Family: More than three times a week  . Frequency of Social Gatherings with Friends and Family: Once a week  . Attends Religious Services: More than 4 times per year  .  Active Member of Clubs or Organizations: Yes  . Attends Archivist Meetings: 1 to 4 times per year  . Marital Status: Widowed    Tobacco Counseling Counseling given: Not Answered   Clinical Intake:  Pre-visit preparation completed: Yes  Pain : No/denies pain     BMI - recorded: 26.16 Nutritional Status: BMI 25 -29 Overweight Nutritional Risks: None Diabetes: Yes CBG done?: Yes (118) CBG resulted in Enter/ Edit results?: No Did pt. bring in CBG monitor from home?: No  How often do you need to have someone help you when you read instructions, pamphlets, or other written materials from your doctor or pharmacy?: 1 - Never  Diabetic?Nutrition Risk Assessment:  Has the patient had any N/V/D within the last 2 months?  No  Does the patient have any non-healing wounds?  No  Has the patient had any unintentional weight loss or weight gain?  No   Diabetes:  Is the patient diabetic?  Yes  If diabetic, was a CBG obtained today?  Yes  Did the patient bring in their glucometer from home?  No  How often do you monitor your CBG's? Daily.   Financial Strains and Diabetes Management:  Are you having any financial strains with the device, your supplies or your medication?  No .  Does the patient want to be seen by Chronic Care Management for management of their diabetes?  No  Would the patient like to be referred to a Nutritionist or for Diabetic Management?  No   Diabetic Exams:  Diabetic Eye Exam: Patient had eye exam on 02/11/20 at next appt, results pending.   Diabetic Foot Exam: Overdue, Pt has been advised about the importance in completing this exam. Pt is scheduled for diabetic foot exam on 02/11/20 at next appt .   Interpreter Needed?: No  Information entered by :: Charlott Rakes, LPN   Activities of Daily Living In your present state of health, do you have any difficulty performing the following activities: 02/05/2020  Hearing? Y  Comment mild loss  Vision? N  Difficulty concentrating or making decisions? Y  Comment memory problems at times  Walking or climbing stairs? Y  Comment can be difficulty  Dressing or bathing? N  Doing errands, shopping? N  Preparing Food and eating ? N  Using the Toilet? N  In the past six months, have you accidently leaked urine? Y  Comment incontient and wears a brief  Do you have problems with loss of bowel control? N  Managing your Medications? N  Managing your Finances? N  Housekeeping or managing your Housekeeping? N  Some recent data might be hidden    Patient Care Team: Martinique, Lennette G, MD as PCP - General (Family Medicine) Germaine Pomfret, [facility] as Pharmacist (Pharmacist)  Indicate any recent Medical Services you may have received from other than [facility] providers in the past year (date may be approximate).     Assessment:   This is a routine wellness examination for De[place].  Hearing/Vision screen  Hearing Screening   125Hz  250Hz  500Hz  1000Hz  2000Hz  3000Hz  4000Hz  6000Hz  8000Hz   Right ear:           Left ear:           Comments: Pt sates mild loss unable to use hearing aids well  Vision Screening Comments: Dr Augustin Coupe follows eye care annually at [facility]  Dietary issues and  exercise activities discussed: Current Exercise Habits: Home exercise routine, Type of exercise: Other -  see comments (chair exercises), Time (Minutes): 30, Frequency (Times/Week): 2, Weekly Exercise (Minutes/Week): 60  Goals    . Chronic Care Management     CARE PLAN ENTRY (see longitudinal plan of care for additional care plan information)  Current Barriers:  . Chronic Disease Management support, education, and care coordination needs related to Hypertension, Hyperlipidemia, Diabetes, GERD, Chronic Kidney Disease, Depression, Osteoarthritis and Gout    Hypertension BP Readings from Last 3 Encounters:  11/12/19 128/80  08/07/19 (!) 150/90  07/23/19 129/74   . Pharmacist Clinical Goal(s): o Over the next 90 days, patient will work with PharmD and providers to maintain BP goal <140/90 . Current regimen:   Amlodipine 5 mg daily  Benazepril 20 mg daily . Interventions: o Discussed low salt diet and exercising as tolerated extensively o Recommend switching to Lotrel (Amlodipine + Benazepril) 5-40 mg daily . Patient self care activities - Over the next 90 days, patient will: o Check blood pressure 3-5 times weekly document, and provide at future appointments o Ensure daily salt intake < 2300 mg/day  Hyperlipidemia Lab Results  Component Value Date/Time   LDLCALC 68 12/18/2018 09:23 AM   . Pharmacist Clinical Goal(s): o Over the next 90 days, patient will work with PharmD and providers to maintain LDL goal < 70 . Current regimen:  o None . Interventions: o Discussed low cholesterol diet and exercising as tolerated extensively o Recommend rechecking cholesterol panel at next visit with Dr. Martinique  Diabetes Lab Results  Component Value Date/Time   HGBA1C 5.9 (A) 07/02/2019 11:12 AM   HGBA1C 5.9 12/18/2018 09:23 AM   HGBA1C 6.0 01/22/2018 09:17 AM   HGBA1C 5.7 07/27/2015 12:00 AM   HGBA1C 5.9 03/27/2015 12:00 AM   . Pharmacist Clinical Goal(s): o Over the next 90 days,  patient will work with PharmD and providers to maintain A1c goal <6.5% . Current regimen:  o None . Interventions: o Discussed carbohydrate counting and exercising as tolerated extensively . Patient self care activities - Over the next 90 days, patient will: o Check blood sugar once daily, document, and provide at future appointments o Contact provider with any episodes of hypoglycemia  Mood and Sleep . Pharmacist Clinical Goal(s) o Over the next 90 days, patient will work with PharmD and providers to maintain stable mood and improve sleep quality  . Current regimen:   Wellbutrin 75 mg twice daily   Cymbalta 60 mg daily  . Interventions: o Recommend changing Wellbutrin to Wellbutrin XR 300 mg daily  o Counseled on proper sleep hygiene   Medication management . Pharmacist Clinical Goal(s): o Over the next 90 days, patient will work with PharmD and providers to maintain optimal medication adherence . Current pharmacy: [facility] . Interventions o Comprehensive medication review performed. o Continue current medication management strategy . Patient self care activities - Over the next 90 days, patient will: o Take medications as prescribed o Report any questions or concerns to PharmD and/or provider(s)    . Patient Stated     Will get some counseling to help with depression. Continue walking daily, 20-30 min. Fall prevention.     . Patient Stated     Stay healthy    . Pt wants to complete appts for dermatologist and podiatrist.       Depression Screen PHQ 2/9 Scores 02/05/2020 08/07/2019 01/30/2019 09/26/2017 09/22/2016  PHQ - 2 Score 0 5 1 3 6   PHQ- 9 Score - 20 - 12 17    Fall Risk  Fall Risk  02/05/2020 01/30/2019 10/30/2017 09/26/2017 09/26/2017  Falls in the past year? 1 1 Yes No No  Number falls in past yr: 1 1 2  or more - -  Injury with Fall? 1 0 - - -  Risk for fall due to : Impaired balance/gait;Impaired mobility;History of fall(s) Impaired  balance/gait;Orthopedic patient - - -  Follow up Falls prevention discussed Education provided - - -    Any stairs in or around the home? No  If so, are there any without handrails? No  Home free of loose throw rugs in walkways, pet beds, electrical cords, etc? Yes  Adequate lighting in your home to reduce risk of falls? Yes   ASSISTIVE DEVICES UTILIZED TO PREVENT FALLS:  Life alert? Yes  Use of a cane, walker or w/c? Yes  Grab bars in the bathroom? Yes  Shower chair or bench in shower? Yes  Elevated toilet seat or a handicapped toilet? Yes   TIMED UP AND GO:  Was the test performed? No .      Cognitive Function: MMSE - Mini Mental State Exam 10/30/2017 09/26/2017  Not completed: - (No Data)  Orientation to time 5 -  Orientation to Place 5 -  Registration 3 -  Attention/ Calculation 5 -  Recall 2 -  Language- name 2 objects 2 -  Language- repeat 1 -  Language- follow 3 step command 3 -  Language- read & follow direction 1 -  Write a sentence 1 -  Copy design 1 -  Total score 29 -     6CIT Screen 02/05/2020  What Year? 0 points  What month? 0 points  Count back from 20 0 points  Months in reverse 0 points  Repeat phrase 0 points    Immunizations Immunization History  Administered Date(s) Administered  . Hepatitis B, adult 01/22/2018  . Influenza Whole 02/01/2016  . Influenza, High Dose Seasonal PF 01/14/2016, 11/17/2018  . Influenza-Unspecified 01/12/2017, 12/26/2017, 12/13/2019  . PFIZER SARS-COV-2 Vaccination 05/03/2019, 05/28/2019, 01/11/2020  . Tdap 12/24/2015  . Zoster Recombinat (Shingrix) 08/16/2016, 01/12/2017    TDAP status: Up to date Flu Vaccine status: Up to date Pneumococcal vaccine status: Declined,  Education has been provided regarding the importance of this vaccine but patient still declined. Advised may receive this vaccine at local pharmacy or Health Dept. Aware to provide a copy of the vaccination record if obtained from local pharmacy or  Health Dept. Verbalized acceptance and understanding.  Covid-19 vaccine status: Completed vaccines  Qualifies for Shingles Vaccine? Yes   Zostavax completed No   Shingrix Completed?: Yes  Screening Tests Health Maintenance  Topic Date Due  . FOOT EXAM  02/11/2020 (Originally 01/01/2020)  . HEMOGLOBIN A1C  02/11/2020 (Originally 01/01/2020)  . OPHTHALMOLOGY EXAM  02/11/2020 (Originally 01/01/2020)  . Hepatitis C Screening  02/11/2020 (Originally Jul 13, 1940)  . TETANUS/TDAP  12/23/2025  . INFLUENZA VACCINE  Completed  . DEXA SCAN  Completed  . COVID-19 Vaccine  Completed  . PNA vac Low Risk Adult  Discontinued    Health Maintenance  There are no preventive care reminders to display for this patient.  Colorectal cancer screening: No longer required.  Mammogram status: Completed 03/08/19. Repeat every year Bone Density status: Completed 11/17/16. Results reflect: Bone density results: OSTEOPOROSIS. Repeat every 2 years.   Additional Screening:  Hepatitis C Screening: does  qualify Vision Screening: Recommended annual ophthalmology exams for early detection of glaucoma and other disorders of the eye. Is the patient up to date with  their annual eye exam?  Yes  Who is the provider or what is the name of the office in which the patient attends annual eye exams? Dr Augustin Coupe   Dental Screening: Recommended annual dental exams for proper oral hygiene  Community Resource Referral / Chronic Care Management: CRR required this visit?  No   CCM required this visit?  No      Plan:     I have personally reviewed and noted the following in the patient's chart:   . Medical and social history . Use of alcohol, tobacco or illicit drugs  . Current medications and supplements . Functional ability and status . Nutritional status . Physical activity . Advanced directives . List of other physicians . Hospitalizations, surgeries, and ER visits in previous 12 months . Vitals . Screenings to  include cognitive, depression, and falls . Referrals and appointments  In addition, I have reviewed and discussed with patient certain preventive protocols, quality metrics, and best practice recommendations. A written personalized care plan for preventive services as well as general preventive health recommendations were provided to patient.     Willette Brace, LPN   [id number]   Nurse Notes: None   I have reviewed available documentation from this visit and I agree with recommendations given.  Rhylynn G. Martinique, MD  [facility]. [place] office.

## 2020-02-08 ENCOUNTER — Other Ambulatory Visit: Payer: Self-pay | Admitting: Family Medicine

## 2020-02-08 DIAGNOSIS — G629 Polyneuropathy, unspecified: Secondary | ICD-10-CM

## 2020-02-08 DIAGNOSIS — F331 Major depressive disorder, recurrent, moderate: Secondary | ICD-10-CM

## 2020-02-11 ENCOUNTER — Encounter: Payer: Self-pay | Admitting: Family Medicine

## 2020-02-11 ENCOUNTER — Ambulatory Visit (INDEPENDENT_AMBULATORY_CARE_PROVIDER_SITE_OTHER): Payer: Medicare Other | Admitting: Family Medicine

## 2020-02-11 ENCOUNTER — Other Ambulatory Visit: Payer: Self-pay

## 2020-02-11 VITALS — BP 124/76 | HR 93 | Temp 97.8°F | Resp 16 | Ht 66.0 in | Wt 160.4 lb

## 2020-02-11 DIAGNOSIS — M791 Myalgia, unspecified site: Secondary | ICD-10-CM | POA: Diagnosis not present

## 2020-02-11 DIAGNOSIS — E1149 Type 2 diabetes mellitus with other diabetic neurological complication: Secondary | ICD-10-CM | POA: Diagnosis not present

## 2020-02-11 DIAGNOSIS — I674 Hypertensive encephalopathy: Secondary | ICD-10-CM | POA: Diagnosis not present

## 2020-02-11 DIAGNOSIS — N1831 Chronic kidney disease, stage 3a: Secondary | ICD-10-CM | POA: Diagnosis not present

## 2020-02-11 DIAGNOSIS — E785 Hyperlipidemia, unspecified: Secondary | ICD-10-CM | POA: Diagnosis not present

## 2020-02-11 DIAGNOSIS — R143 Flatulence: Secondary | ICD-10-CM | POA: Diagnosis not present

## 2020-02-11 DIAGNOSIS — T466X5A Adverse effect of antihyperlipidemic and antiarteriosclerotic drugs, initial encounter: Secondary | ICD-10-CM

## 2020-02-11 DIAGNOSIS — E1169 Type 2 diabetes mellitus with other specified complication: Secondary | ICD-10-CM

## 2020-02-11 NOTE — Assessment & Plan Note (Signed)
Adequate hydration, low salt diet,and NSAID's avoidance to continue. Continue Benazepril. Adequate BP and glucose control.

## 2020-02-11 NOTE — Assessment & Plan Note (Addendum)
BP adequately controlled. Continue monitoring BP regularly. Continue Amlodipine 5 mg daily and benazepril 20 mg daily.

## 2020-02-11 NOTE — Progress Notes (Signed)
HPI: Alexa Brown is a 79 y.o. female, who is here today with her daughter for 3-4 months follow up.   She was last seen on 11/12/19, when we adjusted her BP medications. HTN: Currently she is on benazepril 20 mg daily and amlodipine 5 mg daily. BP is usually 120s/70s, occasionally 140s. Negative for unusual/severe headache, visual changes, CP, dyspnea, palpitation, new focal weakness, or edema.  CKD III: She has not noted gross hematuria , foam in urine,or decreased urine output.  Lab Results  Component Value Date   CREATININE 0.96 08/07/2019   BUN 18 08/07/2019   NA 140 08/07/2019   K 4.7 08/07/2019   CL 104 08/07/2019   CO2 29 08/07/2019   HLD: She is on non pharmacologic treatment.  Atorvastatin discontinued because side effects. She feels a lot of better. Unstable gait, myalgias, arthralgias, and memory have improved. Sometimes she does not need to use her cane at home.  Lab Results  Component Value Date   CHOL 160 12/18/2018   HDL 75.70 12/18/2018   LDLCALC 68 12/18/2018   TRIG 83.0 12/18/2018   CHOLHDL 2 12/18/2018   DM2:BS's 120's-140's. She is on no pharmacologic treatment. + Peripheral neuropathy, currently she is on Cymbalta 60 mg daily.  Lab Results  Component Value Date   HGBA1C 5.9 (A) 07/02/2019   Lab Results  Component Value Date   MICROALBUR 0.8 01/01/2019   MICROALBUR <0.7 07/24/2017   She is also asking what she can take for "gas." + Flatulence. No associated changes in bowel habits, abdominal pain, nausea, or vomiting. She has been eating more salads recently. She has not identified exacerbating or alleviating factors.  Review of Systems  Constitutional: Negative for activity change, appetite change and fever.  HENT: Negative for mouth sores, nosebleeds and sore throat.   Respiratory: Negative for cough and wheezing.   Gastrointestinal: Negative for abdominal distention and blood in stool.  Genitourinary: Negative for  decreased urine volume, dysuria and hematuria.  Musculoskeletal: Positive for gait problem.  Neurological: Negative for syncope and facial asymmetry.  Psychiatric/Behavioral: Negative for confusion and hallucinations.  Rest of ROS, see pertinent positives sand negatives in HPI  Current Outpatient Medications on File Prior to Visit  Medication Sig Dispense Refill  . ACCU-CHEK AVIVA PLUS test strip USE TO TEST BLOOD SUGAR ONCE DAILY 100 strip 1  . Accu-Chek Softclix Lancets lancets Use to check blood sugar once daily 100 each 2  . allopurinol (ZYLOPRIM) 100 MG tablet Take 1 tablet (100 mg total) by mouth 2 (two) times daily. 180 tablet 3  . amLODipine (NORVASC) 5 MG tablet TAKE 1/2 TABLET BY MOUTH DAILY 30 tablet 6  . ascorbic acid (VITAMIN C) 250 MG CHEW Chew 500 mg by mouth daily.    . benazepril (LOTENSIN) 20 MG tablet TAKE ONE TABLET BY MOUTH DAILY 90 tablet 1  . buPROPion (WELLBUTRIN) 75 MG tablet Take 1 tablet (75 mg total) by mouth 2 (two) times daily. 180 tablet 1  . calcium carbonate (OSCAL) 1500 (600 Ca) MG TABS tablet Take 600 mg of elemental calcium by mouth at bedtime.     . Cholecalciferol (VITAMIN D3) 5000 units CAPS Take 5,000 Units by mouth at bedtime.     . clopidogrel (PLAVIX) 75 MG tablet TAKE ONE TABLET BY MOUTH DAILY (Patient taking differently: Take 75 mg by mouth daily. ) 90 tablet 1  . colchicine 0.6 MG tablet TAKE ONE TABLET BY MOUTH TWICE A DAY 30 tablet 0  .  diclofenac sodium (VOLTAREN) 1 % GEL Place 2 g onto the skin 4 (four) times daily as needed (pain).     . DULoxetine (CYMBALTA) 60 MG capsule TAKE ONE CAPSULE BY MOUTH DAILY 90 capsule 2  . fluticasone (FLONASE) 50 MCG/ACT nasal spray Place 2 sprays into both nostrils as needed for allergies.  (Patient not taking: Reported on 02/05/2020)    . L-Lysine 500 MG CAPS Take 500 mg by mouth at bedtime.     Elmore Guise Devices (ACCU-CHEK SOFTCLIX) lancets Use to test blood sugar once a day. 100 each 2  . Magnesium Citrate  100 MG TABS     . Melatonin 10 MG TBCR Take 10 mg by mouth at bedtime.     . Multiple Vitamin (MULTIVITAMIN) capsule Take 1 capsule by mouth daily.     . Omega-3 Fatty Acids (FISH OIL) 1000 MG CAPS Take 1,000 mg by mouth daily.     . pantoprazole (PROTONIX) 20 MG tablet TAKE ONE TABLET BY MOUTH DAILY  (NEW DOSE) 90 tablet 2  . polyethylene glycol (MIRALAX / GLYCOLAX) 17 g packet Take 17 g by mouth as needed.    Marland Kitchen PRESCRIPTION MEDICATION Take 1 capsule by mouth 2 (two) times daily. New remedy vitamin mixture for neuropathy    . Triamcinolone Acetonide (TRIAMCINOLONE 0.1 % CREAM : EUCERIN) CREA Apply 1 application topically 2 (two) times daily as needed for rash or itching. 500 each 1  . vitamin E 400 UNIT capsule Take 400 Units by mouth daily.      No current facility-administered medications on file prior to visit.   Past Medical History:  Diagnosis Date  . Chicken pox   . Depression   . Diabetes mellitus without complication ([place])   . GERD (gastroesophageal reflux disease)   . Hyperlipidemia   . Hypertension   . Stroke ([place])   . UTI (urinary tract infection)    No Known Allergies  Social History   Socioeconomic History  . Marital status: Widowed    Spouse name: Not on file  . Number of children: Not on file  . Years of education: Not on file  . Highest education level: Not on file  Occupational History  . Occupation: N/A  Tobacco Use  . Smoking status: Never Smoker  . Smokeless tobacco: Never Used  Vaping Use  . Vaping Use: Never used  Substance and Sexual Activity  . Alcohol use: Yes    Comment: occassional wine  . Drug use: No  . Sexual activity: Not Currently  Other Topics Concern  . Not on file  Social History Narrative   Pt lives in single story home with her daughter   Has 2 children   Some college education   Retired Engineer, production for [employer]   Social Determinants of Health   Financial Resource Strain: [place]   . Difficulty of Paying  Living Expenses: Not hard at all  Food Insecurity: No Food Insecurity  . Worried About Charity fundraiser in the Last Year: Never true  . Ran Out of Food in the Last Year: Never true  Transportation Needs: No Transportation Needs  . Lack of Transportation (Medical): No  . Lack of Transportation (Non-Medical): No  Physical Activity: Insufficiently Active  . Days of Exercise per Week: 2 days  . Minutes of Exercise per Session: 30 min  Stress: No Stress Concern Present  . Feeling of Stress : Not at all  Social Connections: Moderately Integrated  . Frequency of  Communication with Friends and Family: More than three times a week  . Frequency of Social Gatherings with Friends and Family: Once a week  . Attends Religious Services: More than 4 times per year  . Active Member of Clubs or Organizations: Yes  . Attends Archivist Meetings: 1 to 4 times per year  . Marital Status: Widowed    Vitals:   02/11/20 0828  BP: 124/76  Pulse: 93  Resp: 16  Temp: 97.8 F (36.6 C)  SpO2: 95%   Body mass index is 25.89 kg/m.  Physical Exam Vitals and nursing note reviewed.  Constitutional:      General: She is not in acute distress.    Appearance: She is well-developed.  HENT:     Head: Normocephalic and atraumatic.  Eyes:     Conjunctiva/sclera: Conjunctivae normal.     Pupils: Pupils are equal, round, and reactive to light.  Cardiovascular:     Rate and Rhythm: Normal rate and regular rhythm.     Heart sounds: No murmur heard.      Comments: DP pulses present, bilateral. Pulmonary:     Effort: Pulmonary effort is normal. No respiratory distress.     Breath sounds: Normal breath sounds.  Abdominal:     Palpations: Abdomen is soft. There is no hepatomegaly or mass.     Tenderness: There is no abdominal tenderness.  Lymphadenopathy:     Cervical: No cervical adenopathy.  Skin:    General: Skin is warm.     Findings: No erythema or rash.  Neurological:     Mental Status:  She is alert and oriented to person, place, and time.     Cranial Nerves: No cranial nerve deficit.     Comments: Unstable gait assisted with a cane.  Psychiatric:     Comments: Well groomed, good eye contact.   ASSESSMENT AND PLAN:  Ms. Alexa Brown was seen today for 3-4 months follow-up.  Orders Placed This Encounter  Procedures  . COMPLETE METABOLIC PANEL WITH GFR  . Microalbumin / creatinine urine ratio  . Lipid panel  . Hemoglobin A1c   Lab Results  Component Value Date   HGBA1C 6.0 (H) 02/11/2020   Lab Results  Component Value Date   CHOL 263 (H) 02/11/2020   HDL 77 02/11/2020   LDLCALC 164 (H) 02/11/2020   TRIG 106 02/11/2020   CHOLHDL 3.4 02/11/2020   Lab Results  Component Value Date   CREATININE 1.00 (H) 02/11/2020   BUN 18 02/11/2020   NA 139 02/11/2020   K 4.5 02/11/2020   CL 106 02/11/2020   CO2 26 02/11/2020   Lab Results  Component Value Date   ALT 18 02/11/2020   AST 20 02/11/2020   ALKPHOS 60 12/18/2018   BILITOT 0.5 02/11/2020   Serum creatinine: 1 mg/dL (H) 02/11/20 0905 Estimated creatinine clearance: 48.1 mL/min (A)  DM (diabetes mellitus), type 2 with neurological complications ([place]) Problem has been well controlled. Continue non pharmacologic treatment. Annual eye exam, periodic dental and foot care are important and recommended.   Systolic hypertension with cerebrovascular disease BP adequately controlled. Continue monitoring BP regularly. Continue Amlodipine 5 mg daily and benazepril 20 mg daily.   CKD (chronic kidney disease), stage III (HCC) Adequate hydration, low salt diet,and NSAID's avoidance to continue. Continue Benazepril. Adequate BP and glucose control.  Hyperlipidemia associated with type 2 diabetes mellitus ([place]) For now continue non pharmacologic treatment. We discussed some benefits from statins. We can try pravastatin  or Lovastatin low dose.Will wait for FLP results.  Myalgia due to statin We  discussed some side effects of statin meds. She is willing to try a different statin, Pravastatin or Lovastatin may be better tolerated.  Flatulence symptom Recommend trying to identified foods that have a problem, usually greens do. OTC Beano or gas X may help.  Spent 40 minutes.  During this time history was obtained and documented, examination was performed, prior labs reviewed, and assessment/plan discussed.  Return in about 5 months (around 07/11/2020) for DM II,HTN,HLD.  Henrietta G. Martinique, MD  [facility]. [place] office.   A few things to remember from today's visit:   DM (diabetes mellitus), type 2 with neurological complications ([place]) - Plan: COMPLETE METABOLIC PANEL WITH GFR, Microalbumin / creatinine urine ratio, Hemoglobin X8B  Systolic hypertension with cerebrovascular disease  Stage 3a chronic kidney disease (HCC)  Dyslipidemia - Plan: Lipid panel  Myalgia due to statin  If you need refills please call your pharmacy. Do not use My Chart to request refills or for acute issues that need immediate attention. Balance exercises as we discussed. Fall prevention. We can start a mild statin. Continue monitoring blood pressures.   Please be sure medication list is accurate. If a new problem present, please set up appointment sooner than planned today.

## 2020-02-11 NOTE — Assessment & Plan Note (Signed)
We discussed some side effects of statin meds. She is willing to try a different statin, Pravastatin or Lovastatin may be better tolerated.

## 2020-02-11 NOTE — Assessment & Plan Note (Signed)
For now continue non pharmacologic treatment. We discussed some benefits from statins. We can try pravastatin or Lovastatin low dose.Will wait for FLP results.

## 2020-02-11 NOTE — Assessment & Plan Note (Signed)
Problem has been well controlled. Continue non pharmacologic treatment. Annual eye exam, periodic dental and foot care are important and recommended.

## 2020-02-11 NOTE — Patient Instructions (Signed)
A few things to remember from today's visit:   DM (diabetes mellitus), type 2 with neurological complications ([place]) - Plan: COMPLETE METABOLIC PANEL WITH GFR, Microalbumin / creatinine urine ratio, Hemoglobin V8F  Systolic hypertension with cerebrovascular disease  Stage 3a chronic kidney disease (HCC)  Dyslipidemia - Plan: Lipid panel  Myalgia due to statin  If you need refills please call your pharmacy. Do not use My Chart to request refills or for acute issues that need immediate attention. Balance exercises as we discussed. Fall prevention. We can start a mild statin. Continue monitoring blood pressures.   Please be sure medication list is accurate. If a new problem present, please set up appointment sooner than planned today.

## 2020-02-12 ENCOUNTER — Other Ambulatory Visit: Payer: Self-pay | Admitting: Family Medicine

## 2020-02-12 ENCOUNTER — Ambulatory Visit: Payer: Medicare Other

## 2020-02-12 DIAGNOSIS — Z1231 Encounter for screening mammogram for malignant neoplasm of breast: Secondary | ICD-10-CM

## 2020-02-12 LAB — LIPID PANEL
Cholesterol: 263 mg/dL — ABNORMAL HIGH (ref <200)
HDL: 77 mg/dL (ref >=50)
LDL Cholesterol (Calc): 164 mg/dL (calc) — ABNORMAL HIGH
Non-HDL Cholesterol (Calc): 186 mg/dL (calc) — ABNORMAL HIGH (ref <130)
Total CHOL/HDL Ratio: 3.4 (calc) (ref <5.0)
Triglycerides: 106 mg/dL (ref <150)

## 2020-02-12 LAB — COMPLETE METABOLIC PANEL WITH GFR
AG Ratio: 2 (calc) (ref 1.0–2.5)
ALT: 18 U/L (ref 6–29)
AST: 20 U/L (ref 10–35)
Albumin: 4.3 g/dL (ref 3.6–5.1)
Alkaline phosphatase (APISO): 47 U/L (ref 37–153)
BUN/Creatinine Ratio: 18 (calc) (ref 6–22)
BUN: 18 mg/dL (ref 7–25)
CO2: 26 mmol/L (ref 20–32)
Calcium: 10.3 mg/dL (ref 8.6–10.4)
Chloride: 106 mmol/L (ref 98–110)
Creat: 1 mg/dL — ABNORMAL HIGH (ref 0.60–0.93)
GFR, Est African American: 63 mL/min/{1.73_m2} (ref >=60)
GFR, Est Non African American: 54 mL/min/{1.73_m2} — ABNORMAL LOW (ref >=60)
Globulin: 2.2 g/dL (calc) (ref 1.9–3.7)
Glucose, Bld: 115 mg/dL — ABNORMAL HIGH (ref 65–99)
Potassium: 4.5 mmol/L (ref 3.5–5.3)
Sodium: 139 mmol/L (ref 135–146)
Total Bilirubin: 0.5 mg/dL (ref 0.2–1.2)
Total Protein: 6.5 g/dL (ref 6.1–8.1)

## 2020-02-12 LAB — HEMOGLOBIN A1C
Hgb A1c MFr Bld: 6 % of total Hgb — ABNORMAL HIGH (ref <5.7)
Mean Plasma Glucose: 126 (calc)
eAG (mmol/L): 7 (calc)

## 2020-02-12 MED ORDER — PRAVASTATIN SODIUM 20 MG PO TABS: 20.0000 mg | ORAL_TABLET | Freq: Every day | ORAL | 3 refills | Status: DC

## 2020-03-03 ENCOUNTER — Telehealth: Payer: Medicare Other

## 2020-03-03 ENCOUNTER — Other Ambulatory Visit: Payer: Self-pay | Admitting: Family Medicine

## 2020-03-25 ENCOUNTER — Telehealth: Payer: Self-pay | Admitting: Pharmacist

## 2020-03-25 ENCOUNTER — Ambulatory Visit: Payer: Medicare Other

## 2020-03-25 NOTE — Chronic Care Management (AMB) (Signed)
I left the patient a message about her upcoming appointment on 03-26-2020 @ 11 AM with the clinical pharmacist. She was asked to please have all medication on hand to review the pharmacist.   Berenice Bouton, [facility] Clinical Pharmacy Assistant [phone]

## 2020-03-26 ENCOUNTER — Ambulatory Visit: Payer: Medicare Other | Admitting: Pharmacist

## 2020-03-26 ENCOUNTER — Encounter: Payer: Self-pay | Admitting: Family Medicine

## 2020-03-26 DIAGNOSIS — E1149 Type 2 diabetes mellitus with other diabetic neurological complication: Secondary | ICD-10-CM

## 2020-03-26 DIAGNOSIS — E785 Hyperlipidemia, unspecified: Secondary | ICD-10-CM

## 2020-03-26 DIAGNOSIS — E1169 Type 2 diabetes mellitus with other specified complication: Secondary | ICD-10-CM

## 2020-03-26 NOTE — Chronic Care Management (AMB) (Signed)
Chronic Care Management Pharmacy  Name: Alexa Brown  MRN: [id number] DOB: 1940-11-03   Chief Complaint/ HPI  Alexa Brown,  79 y.o. , female presents for their Follow-Up CCM visit with the clinical pharmacist via telephone due to COVID-19 Pandemic.   PCP : Martinique, Brande G, MD Patient Care Team: Martinique, Shelvia G, MD as PCP - General (Family Medicine) Viona Gilmore, [facility] as Pharmacist (Pharmacist)  Their chronic conditions include: Hypertension, Hyperlipidemia, Diabetes, GERD, Chronic Kidney Disease, Depression, Osteoarthritis and Gout   Office Visits: 02/11/20 Irving Martinique, MD: Patient presented for follow up. Started Pravastatin 20 mg daily. 02/05/20 Charlott Rakes, LPN: Patient presented for medicare annual wellness visit. 11/12/19: Patient presented to Dr. Martinique for follow-up. Amlodipine increased to 5 mg daily.  08/07/19: Patient presented to Dr. Martinique for follow-up. BP in clinic 150/90, started on amlodipine 2.5 mg daily. Hydrocodone, tramadol stopped (not taking).   Consult Visit: 10/17/19: Patient presented to Dr. Ander Slade (Ophthamology) for glaucoma.   No Known Allergies  Medications: Outpatient Encounter Medications as of 03/26/2020  Medication Sig  . allopurinol (ZYLOPRIM) 100 MG tablet Take 1 tablet (100 mg total) by mouth 2 (two) times daily.  Marland Kitchen amLODipine (NORVASC) 5 MG tablet TAKE 1/2 TABLET BY MOUTH DAILY  . b complex vitamins capsule Take 1 capsule by mouth daily.  Marland Kitchen ACCU-CHEK AVIVA PLUS test strip USE TO TEST BLOOD SUGAR ONCE DAILY  . Accu-Chek Softclix Lancets lancets Use to check blood sugar once daily  . ascorbic acid (VITAMIN C) 250 MG CHEW Chew 500 mg by mouth daily.  Marland Kitchen buPROPion (WELLBUTRIN) 75 MG tablet Take 1 tablet (75 mg total) by mouth 2 (two) times daily.  . calcium carbonate (OSCAL) 1500 (600 Ca) MG TABS tablet Take 600 mg of elemental calcium by mouth at bedtime.   . clopidogrel (PLAVIX) 75 MG tablet TAKE ONE TABLET BY MOUTH  DAILY  . colchicine 0.6 MG tablet TAKE ONE TABLET BY MOUTH TWICE A DAY  . diclofenac sodium (VOLTAREN) 1 % GEL Place 2 g onto the skin 4 (four) times daily as needed (pain).   . DULoxetine (CYMBALTA) 60 MG capsule TAKE ONE CAPSULE BY MOUTH DAILY  . fluticasone (FLONASE) 50 MCG/ACT nasal spray Place 2 sprays into both nostrils as needed for allergies.  (Patient not taking: Reported on 02/05/2020)  . L-Lysine 500 MG CAPS Take 500 mg by mouth at bedtime.   Elmore Guise Devices (ACCU-CHEK SOFTCLIX) lancets Use to test blood sugar once a day.  . Magnesium Citrate 100 MG TABS   . Melatonin 10 MG TBCR Take 10 mg by mouth at bedtime.   . Multiple Vitamin (MULTIVITAMIN) capsule Take 1 capsule by mouth daily.   . Omega-3 Fatty Acids (FISH OIL) 1000 MG CAPS Take 1,000 mg by mouth daily.   . pantoprazole (PROTONIX) 20 MG tablet TAKE ONE TABLET BY MOUTH DAILY  (NEW DOSE)  . polyethylene glycol (MIRALAX / GLYCOLAX) 17 g packet Take 17 g by mouth as needed.  . pravastatin (PRAVACHOL) 20 MG tablet Take 1 tablet (20 mg total) by mouth daily.  Marland Kitchen PRESCRIPTION MEDICATION Take 1 capsule by mouth 2 (two) times daily. New remedy vitamin mixture for neuropathy  . Triamcinolone Acetonide (TRIAMCINOLONE 0.1 % CREAM : EUCERIN) CREA Apply 1 application topically 2 (two) times daily as needed for rash or itching.  . vitamin E 400 UNIT capsule Take 400 Units by mouth daily.   . [DISCONTINUED] benazepril (LOTENSIN) 20 MG tablet TAKE ONE TABLET BY  MOUTH DAILY  . [DISCONTINUED] Cholecalciferol (VITAMIN D3) 5000 units CAPS Take 5,000 Units by mouth at bedtime.    No facility-administered encounter medications on file as of 03/26/2020.   Current Diagnosis/Assessment:    Goals Addressed            This Visit's Progress   . Chronic Care Management       CARE PLAN ENTRY (see longitudinal plan of care for additional care plan information)  Current Barriers:  . Chronic Disease Management support, education, and care  coordination needs related to Hypertension, Hyperlipidemia, Diabetes, GERD, Chronic Kidney Disease, Depression, Osteoarthritis and Gout    Hypertension BP Readings from Last 3 Encounters:  02/11/20 124/76  11/12/19 128/80  08/07/19 (!) 150/90   . Pharmacist Clinical Goal(s): o Over the next 90 days, patient will work with PharmD and providers to maintain BP goal <140/90 . Current regimen:   Amlodipine 5 mg daily  Benazepril 20 mg daily . Interventions: o Discussed low salt diet and exercising as tolerated extensively o Recommend switching to Lotrel (Amlodipine + Benazepril) 5-20 mg daily . Patient self care activities - Over the next 90 days, patient will: o Check blood pressure 3-5 times weekly document, and provide at future appointments o Ensure daily salt intake < 2300 mg/day  Hyperlipidemia Lab Results  Component Value Date/Time   LDLCALC 164 (H) 02/11/2020 09:05 AM   . Pharmacist Clinical Goal(s): o Over the next 90 days, patient will work with PharmD and providers to achieve LDL goal < 70 . Current regimen:  o Pravastatin 20 mg daily - not taking . Interventions: o Discussed low cholesterol diet and exercising as tolerated extensively  Diabetes Lab Results  Component Value Date/Time   HGBA1C 6.0 (H) 02/11/2020 09:05 AM   HGBA1C 5.9 (A) 07/02/2019 11:12 AM   HGBA1C 5.9 12/18/2018 09:23 AM   HGBA1C 5.7 07/27/2015 12:00 AM   HGBA1C 5.9 03/27/2015 12:00 AM   . Pharmacist Clinical Goal(s): o Over the next 90 days, patient will work with PharmD and providers to maintain A1c goal <6.5% . Current regimen:  o None . Interventions: o Discussed carbohydrate counting and exercising as tolerated extensively . Patient self care activities - Over the next 90 days, patient will: o Check blood sugar once daily, document, and provide at future appointments o Contact provider with any episodes of hypoglycemia  Mood and Sleep . Pharmacist Clinical Goal(s) o Over the next 90  days, patient will work with PharmD and providers to maintain stable mood and improve sleep quality  . Current regimen:   Wellbutrin 75 mg twice daily   Cymbalta 60 mg daily  . Interventions: o Recommend changing Wellbutrin to Wellbutrin XR 300 mg daily  o Counseled on proper sleep hygiene   Medication management . Pharmacist Clinical Goal(s): o Over the next 90 days, patient will work with PharmD and providers to maintain optimal medication adherence . Current pharmacy: [facility] . Interventions o Comprehensive medication review performed. o Continue current medication management strategy . Patient self care activities - Over the next 90 days, patient will: o Take medications as prescribed o Report any questions or concerns to PharmD and/or provider(s)       Diabetes   A1c goal <7%  Recent Relevant Labs: Lab Results  Component Value Date/Time   HGBA1C 6.0 (H) 02/11/2020 09:05 AM   HGBA1C 5.9 (A) 07/02/2019 11:12 AM   HGBA1C 5.9 12/18/2018 09:23 AM   HGBA1C 5.7 07/27/2015 12:00 AM   HGBA1C  5.9 03/27/2015 12:00 AM   GFR 56.34 (L) 08/07/2019 11:14 AM   GFR 53.14 (L) 07/02/2019 08:58 AM   MICROALBUR 0.8 01/01/2019 10:56 AM   MICROALBUR <0.7 07/24/2017 12:21 PM    Last diabetic Eye exam: No results found for: HMDIABEYEEXA  Last diabetic Foot exam: No results found for: HMDIABFOOTEX   Checking BG: Daily  Recent FBG Readings: 177,157,181,142,134,153 Recent pre-meal BG readings: n/a Recent 2hr PP BG readings:  n/a Recent HS BG readings: n/a  Patient has failed these meds in past: Invokana Patient is currently controlled on the following medications: . none  We discussed: Patient has been more lax lately with diet, reports eating more sweets than normal. Emphasized the importance of moderation and carb counting to maintain blood sugar control.    Plan  Continue control with diet and exercise  Hypertension   BP goal is:  <140/90  Office blood  pressures are  BP Readings from Last 3 Encounters:  02/11/20 124/76  11/12/19 128/80  08/07/19 (!) 150/90   CMP Latest Ref Rng & Units 02/11/2020 08/07/2019 07/02/2019  Glucose 65 - 99 mg/dL 115(H) 126(H) 177(H)  BUN 7 - 25 mg/dL '18 18 17  ' Creatinine 0.60 - 0.93 mg/dL 1.00(H) 0.96 1.01  Sodium 135 - 146 mmol/L 139 140 140  Potassium 3.5 - 5.3 mmol/L 4.5 4.7 4.0  Chloride 98 - 110 mmol/L 106 104 103  CO2 20 - 32 mmol/L '26 29 28  ' Calcium 8.6 - 10.4 mg/dL 10.3 10.1 9.9  Total Protein 6.1 - 8.1 g/dL 6.5 - -  Total Bilirubin 0.2 - 1.2 mg/dL 0.5 - -  Alkaline Phos 39 - 117 U/L - - -  AST 10 - 35 U/L 20 - -  ALT 6 - 29 U/L 18 - -     Patient checks BP at home 3-5x per week Patient home BP readings are ranging: 120-130 (varies times of the day), occasionally up to the 150s  Patient has failed these meds in the past: n/a Patient is currently uncontrolled on the following medications:  . Amlodipine 5 mg daily  . Benazepril 20 mg daily   Patient reports her blood pressures have been much better lately. Cost of combination medication is the same as separate agents.  Plan  Recommend stopping Amlodipine Recommend stopping benazepril  Recommend starting Lotrel (Amlodipine-Benazepril) 5-20 mg daily to ease her pill burden.  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  LDL goal < 70  Lipid Panel     Component Value Date/Time   CHOL 263 (H) 02/11/2020 0905   TRIG 106 02/11/2020 0905   HDL 77 02/11/2020 0905   LDLCALC 164 (H) 02/11/2020 0905    Hepatic Function Latest Ref Rng & Units 02/11/2020 12/18/2018 01/22/2018  Total Protein 6.1 - 8.1 g/dL 6.5 6.7 6.7  Albumin 3.5 - 5.2 g/dL - 4.2 4.2  AST 10 - 35 U/L '20 22 22  ' ALT 6 - 29 U/L '18 17 18  ' Alk Phosphatase 39 - 117 U/L - 60 63  Total Bilirubin 0.2 - 1.2 mg/dL 0.5 0.6 0.5     The ASCVD Risk score Mikey Bussing DC Jr., et al., 2013) failed to calculate for the following reasons:   The patient has a  prior MI or stroke diagnosis   Patient has failed these meds in past: Atorvastatin 40 mg (foot cramping, fear of side effects) Patient is currently controlled on the following medications:  . Clopidogrel 75 mg daily  .  Pravastatin 20 mg daily - not taking due to side effects  We discussed: diet and exercise and the importance of taking a cholesterol medication -Patient experienced fogginess, dizziness, weakness of the legs and unable to control balance while taking pravastatin -Patient plans to restart mediterranean diet -Lowering cholesterol through diet by: Marland Kitchen Limiting foods with cholesterol such as liver and other organ meats, egg yolks, shrimp, and whole milk dairy products . Avoiding saturated fats and trans fats and incorporating healthier fats, such as lean meat, nuts, and unsaturated oils like canola and olive oils . Eating foods with soluble fiber such as whole-grain cereals such as oatmeal and oat bran, fruits such as apples, bananas, oranges, pears, and prunes, legumes such as kidney beans, lentils, chick peas, black-eyed peas, and lima beans, and green leafy vegetables . Limiting alcohol intake -Exercise: patient has taken up some new hobbies that require a lot of sitting; patient reports she needs to restart walking again and chair yoga  Plan Continue current medication.  Gout   Uric Acid, Serum  Date Value Ref Range Status  12/18/2018 5.7 2.4 - 7.0 mg/dL Final     Goal Uric Acid < 6 mg/dL   Medications that may increase uric acid levels: None  Last gout flare: Many years ago  Patient has failed these meds in past: n/a Patient is currently controlled on the following medications:  . Allopurinol 100 mg twice daily   We discussed:  Counseled patient on low purine diet plan. Counseled patient to reduce consumption of high-fructose corn syrup, sweetened soft drinks, fruit juices, meat, and seafood.  Patient reports she has had some issues lately with big toe pain waking  her up at night but denies any redness or swelling. She is unsure if this is gout or not.  Plan Recommend repeat UA as patient is overdue. Continue current medications  Depression / Anxiety   PHQ9 Score:  PHQ9 SCORE ONLY 02/05/2020 08/07/2019 01/30/2019  PHQ-9 Total Score 0 20 1   GAD7 Score: GAD 7 : Generalized Anxiety Score 08/07/2019  Nervous, Anxious, on Edge 1  Control/stop worrying 1  Worry too much - different things 1  Trouble relaxing 1  Restless 1  Easily annoyed or irritable 1  Afraid - awful might happen 1  Total GAD 7 Score 7  Anxiety Difficulty Somewhat difficult    Patient has failed these meds in past: n/a Patient is currently uncontrolled on the following medications:  . Wellbutrin 75 mg twice daily  . Cymbalta 60 mg daily   We discussed:  Patient reports that the holidays are especially bad with missing husband but denies feeling anxious or nervous  Plan  Recommend stopping Bupropion (ineffective)  Recommend starting Bupropion XR 300 mg daily to reduce pill burden, lessen chance of side effects.  Bone Health    Last DEXA Scan: 11/17/16   T-Score femoral neck: -0.5  T-Score total hip: n/a  T-Score lumbar spine: +0.0  T-Score forearm radius: n/a  10-year probability of major osteoporotic fracture: n/a  10-year probability of hip fracture: n/a  Vit D, 25-Hydroxy  Date Value Ref Range Status  07/21/2017 67 30 - 100 ng/mL Final    Comment:    Vitamin D Status         25-OH Vitamin D: . Deficiency:                    <20 ng/mL Insufficiency:  20 - 29 ng/mL Optimal:                 > or = 30 ng/mL . For 25-OH Vitamin D testing on patients on  D2-supplementation and patients for whom quantitation  of D2 and D3 fractions is required, the QuestAssureD(TM) 25-OH VIT D, (D2,D3), LC/MS/MS is recommended: order  code [id number] (patients >29yr). . For more information on this test, go to: http://education.questdiagnostics.com/faq/FAQ163 (This  link is being provided for  informational/educational purposes only.)      Patient is not a candidate for pharmacologic treatment  Patient has failed these meds in past: n/a Patient is currently controlled on the following medications:  . Calcium Carbonate 600 mg QHS + 800 units of D   We discussed:  Recommend [phone] units of vitamin D daily. Recommend 1200 mg of calcium daily from dietary and supplemental sources.  Plan Continue current medications.   GERD   Patient denies dysphagia, heartburn or nausea. Expresses understanding to avoid triggers such as large meals and lying down after eating.  Currently controlled on: . Pantoprazole 20 mg daily   Plan   Continue current medication.  Reassess the need for this medication at follow up.  Insomnia    Patient has failed these meds in past: n/a Patient is currently uncontrolled on the following medications:  .Marland KitchenMelatonin 10 mg QHS (does not take fridays and saturdays)   We discussed:  Patient finds it hard to fall asleep, stating it often takes 1-2 hours to fall asleep. Discussed proper sleep hygiene.   Plan  Continue current medications  Misc / OTC    . Voltaren 1% gel  . Flonase 50 mcg/act 2 spray daily PRN  . L-Lysine 1000 mg daily (Cold sores)  . Magnesium Citrate 100 mg  . Miralax 17 g daily PRN . Triamcinolone cream . Vitamin E 400 units daily  . NeuRemedy (Fenfotiamine 300 mg 1 tablet twice daily)  . Equate Womens 50+ multivitamin daily . Vitamin C 250 mg 2 chewables daily  Vaccines   Reviewed and discussed patient's vaccination history.    Immunization History  Administered Date(s) Administered  . Hepatitis B, adult 01/22/2018  . Influenza Whole 02/01/2016  . Influenza, High Dose Seasonal PF 01/14/2016, 11/17/2018  . Influenza-Unspecified 01/12/2017, 12/26/2017, 12/13/2019  . PFIZER(Purple Top)SARS-COV-2 Vaccination 05/03/2019, 05/28/2019, 01/11/2020  . Tdap 12/24/2015  . Zoster Recombinat  (Shingrix) 08/16/2016, 01/12/2017   Patient is up to date on immunizations.  Medication Management   [facility] Uses pill box? Yes, daughter fills for her.  Plan  Continue current medication management strategy  Follow up: 3 month phone visit   MJeni Salles PharmD BBear CreekPharmacist LRiverdaleat [phone]

## 2020-04-05 ENCOUNTER — Other Ambulatory Visit: Payer: Self-pay | Admitting: Family Medicine

## 2020-04-09 ENCOUNTER — Ambulatory Visit: Payer: Self-pay | Admitting: Podiatry

## 2020-04-09 DIAGNOSIS — Z961 Presence of intraocular lens: Secondary | ICD-10-CM | POA: Insufficient documentation

## 2020-04-09 DIAGNOSIS — H35372 Puckering of macula, left eye: Secondary | ICD-10-CM | POA: Diagnosis not present

## 2020-04-09 DIAGNOSIS — H532 Diplopia: Secondary | ICD-10-CM | POA: Insufficient documentation

## 2020-04-09 DIAGNOSIS — E1149 Type 2 diabetes mellitus with other diabetic neurological complication: Secondary | ICD-10-CM | POA: Diagnosis not present

## 2020-04-09 DIAGNOSIS — H26493 Other secondary cataract, bilateral: Secondary | ICD-10-CM | POA: Diagnosis not present

## 2020-04-14 ENCOUNTER — Ambulatory Visit (INDEPENDENT_AMBULATORY_CARE_PROVIDER_SITE_OTHER): Payer: Medicare Other | Admitting: Podiatry

## 2020-04-14 ENCOUNTER — Other Ambulatory Visit: Payer: Self-pay

## 2020-04-14 ENCOUNTER — Telehealth: Payer: Self-pay

## 2020-04-14 DIAGNOSIS — B351 Tinea unguium: Secondary | ICD-10-CM

## 2020-04-14 DIAGNOSIS — E11610 Type 2 diabetes mellitus with diabetic neuropathic arthropathy: Secondary | ICD-10-CM | POA: Diagnosis not present

## 2020-04-14 DIAGNOSIS — M79675 Pain in left toe(s): Secondary | ICD-10-CM | POA: Diagnosis not present

## 2020-04-14 DIAGNOSIS — Z7901 Long term (current) use of anticoagulants: Secondary | ICD-10-CM

## 2020-04-14 DIAGNOSIS — M79674 Pain in right toe(s): Secondary | ICD-10-CM | POA: Diagnosis not present

## 2020-04-14 MED ORDER — AMLODIPINE BESY-BENAZEPRIL HCL 5-20 MG PO CAPS: 1.0000 | ORAL_CAPSULE | Freq: Every day | ORAL | 3 refills | Status: DC

## 2020-04-14 NOTE — Progress Notes (Signed)
Subjective:   Patient ID: Alexa Brown, female   DOB: 80 y.o.   MRN: [id number]   HPI 80 year old female presents the office with her daughter for concerns of her nails becoming thick and elongated.  She also has neuropathy.  She has seen another podiatrist has been over 3 years and she has been on NeuRemedy and states is not having as much as it has been previously.  She gets most symptoms at nighttime also gets cramping at night.  She had no other treatment for neuropathy.  She is currently on Plavix.   Review of Systems  All other systems reviewed and are negative.  Past Medical History:  Diagnosis Date  . Chicken pox   . Depression   . Diabetes mellitus without complication ([place])   . GERD (gastroesophageal reflux disease)   . Hyperlipidemia   . Hypertension   . Stroke ([place])   . UTI (urinary tract infection)     Past Surgical History:  Procedure Laterality Date  . ABDOMINAL HYSTERECTOMY    . APPENDECTOMY    . BREAST BIOPSY Left 2014  . BREAST SURGERY     biopsy     Current Outpatient Medications:  .  ACCU-CHEK AVIVA PLUS test strip, USE TO TEST BLOOD SUGAR ONCE DAILY, Disp: 100 strip, Rfl: 1 .  Accu-Chek Softclix Lancets lancets, Use to check blood sugar once daily, Disp: 100 each, Rfl: 2 .  allopurinol (ZYLOPRIM) 100 MG tablet, Take 1 tablet (100 mg total) by mouth 2 (two) times daily., Disp: 180 tablet, Rfl: 3 .  amLODipine (NORVASC) 5 MG tablet, TAKE 1/2 TABLET BY MOUTH DAILY, Disp: 30 tablet, Rfl: 6 .  ascorbic acid (VITAMIN C) 250 MG CHEW, Chew 500 mg by mouth daily., Disp: , Rfl:  .  b complex vitamins capsule, Take 1 capsule by mouth daily., Disp: , Rfl:  .  benazepril (LOTENSIN) 20 MG tablet, TAKE ONE TABLET BY MOUTH DAILY, Disp: 90 tablet, Rfl: 1 .  buPROPion (WELLBUTRIN) 75 MG tablet, Take 1 tablet (75 mg total) by mouth 2 (two) times daily., Disp: 180 tablet, Rfl: 1 .  calcium carbonate (OSCAL) 1500 (600 Ca) MG TABS tablet, Take 600 mg of elemental  calcium by mouth at bedtime. , Disp: , Rfl:  .  clopidogrel (PLAVIX) 75 MG tablet, TAKE ONE TABLET BY MOUTH DAILY, Disp: 90 tablet, Rfl: 3 .  colchicine 0.6 MG tablet, TAKE ONE TABLET BY MOUTH TWICE A DAY, Disp: 30 tablet, Rfl: 0 .  diclofenac sodium (VOLTAREN) 1 % GEL, Place 2 g onto the skin 4 (four) times daily as needed (pain). , Disp: , Rfl:  .  DULoxetine (CYMBALTA) 60 MG capsule, TAKE ONE CAPSULE BY MOUTH DAILY, Disp: 90 capsule, Rfl: 2 .  fluticasone (FLONASE) 50 MCG/ACT nasal spray, Place 2 sprays into both nostrils as needed for allergies.  (Patient not taking: Reported on 02/05/2020), Disp: , Rfl:  .  L-Lysine 500 MG CAPS, Take 500 mg by mouth at bedtime. , Disp: , Rfl:  .  Lancet Devices (ACCU-CHEK SOFTCLIX) lancets, Use to test blood sugar once a day., Disp: 100 each, Rfl: 2 .  Magnesium Citrate 100 MG TABS, , Disp: , Rfl:  .  Melatonin 10 MG TBCR, Take 10 mg by mouth at bedtime. , Disp: , Rfl:  .  Multiple Vitamin (MULTIVITAMIN) capsule, Take 1 capsule by mouth daily. , Disp: , Rfl:  .  Omega-3 Fatty Acids (FISH OIL) 1000 MG CAPS, Take 1,000 mg by mouth daily. ,  Disp: , Rfl:  .  pantoprazole (PROTONIX) 20 MG tablet, TAKE ONE TABLET BY MOUTH DAILY  (NEW DOSE), Disp: 90 tablet, Rfl: 2 .  polyethylene glycol (MIRALAX / GLYCOLAX) 17 g packet, Take 17 g by mouth as needed., Disp: , Rfl:  .  pravastatin (PRAVACHOL) 20 MG tablet, Take 1 tablet (20 mg total) by mouth daily., Disp: 90 tablet, Rfl: 3 .  PRESCRIPTION MEDICATION, Take 1 capsule by mouth 2 (two) times daily. New remedy vitamin mixture for neuropathy, Disp: , Rfl:  .  Triamcinolone Acetonide (TRIAMCINOLONE 0.1 % CREAM : EUCERIN) CREA, Apply 1 application topically 2 (two) times daily as needed for rash or itching., Disp: 500 each, Rfl: 1 .  vitamin E 400 UNIT capsule, Take 400 Units by mouth daily. , Disp: , Rfl:   No Known Allergies       Objective:  Physical Exam  General: AAO x3, NAD  Dermatological: Nails are  hypertrophic, dystrophic, brittle, discolored, elongated 10.  Nails have yellow-brown discoloration.  No hyperpigmented changes.  No surrounding redness or drainage. Tenderness nails 1-5 bilaterally. No open lesions or pre-ulcerative lesions are identified today.  Vascular: Dorsalis Pedis artery and Posterior Tibial artery pedal pulses are 2/4 bilateral with immedate capillary fill time.  There is no pain with calf compression, swelling, warmth, erythema.   Neruologic: Grossly intact via light touch bilateral. Vibratory intact via tuning fork bilateral. Protective threshold with Semmes Wienstein monofilament intact to all pedal sites bilateral.   Musculoskeletal: No gross boney pedal deformities bilateral. No pain, crepitus, or limitation noted with foot and ankle range of motion bilateral. Muscular strength 5/5 in all groups tested bilateral.  Gait: Unassisted, Nonantalgic.       Assessment:   80 year old female with symptomatic onychomycosis, currently on Plavix; neuropathy     Plan:  -Treatment options discussed including all alternatives, risks, and complications -Etiology of symptoms were discussed -From a neuropathy standpoint we will add alpha lipoic acid.  She is taking a B complex vitamin as well.  She is still also taking NeuRemedy. She is going to stop the NeuRemedy.  She is already on Cymbalta for depression as well as would hold off any other medications for now. -Debrided the nails x10 without any complications or bleeding.  Discussed further treatment options for onychomycosis.  This time we will continue with routine debridements. -Daily foot inspection  Return in about 3 months (around 07/13/2020).  Trula Slade DPM

## 2020-04-14 NOTE — Telephone Encounter (Signed)
-----   Message from Viona Gilmore, [facility] sent at 04/14/2020  3:40 PM EST ----- Regarding: BP medication switch to combo Hi,  Can you please send in a prescription for the combination tablet of amlodipine and benazepril to [facility]?  Thank you, Maddie

## 2020-04-14 NOTE — Patient Instructions (Addendum)
Hi Ridhi,   It was so lovely to get to meet you over the phone! I am glad that your blood pressure has improved since the last visit. Continue checking that at home so we can keep an eye out on it and make sure your medications are working properly. I looked into the cost of the combination for your blood pressure medications and it should be the same as them separately and Dr. Doug Sou nurse will send that into the pharmacy for you. I will reach out about the cholesterol medication if Dr. Martinique decides to change it or wants to wait until your next appointment.  Please give me a call or send me a message if you have questions or need anything from me! My assistant will probably reach out to you to check in on those blood pressures as well in a month or so.  Best, Maddie  [name], PharmD [facility] Clinical Pharmacist [place] [facility] [place]    Visit Information  Goals Addressed            This Visit's Progress   . Chronic Care Management       CARE PLAN ENTRY (see longitudinal plan of care for additional care plan information)  Current Barriers:  . Chronic Disease Management support, education, and care coordination needs related to Hypertension, Hyperlipidemia, Diabetes, GERD, Chronic Kidney Disease, Depression, Osteoarthritis and Gout    Hypertension BP Readings from Last 3 Encounters:  02/11/20 124/76  11/12/19 128/80  08/07/19 (!) 150/90   . Pharmacist Clinical Goal(s): o Over the next 90 days, patient will work with PharmD and providers to maintain BP goal <140/90 . Current regimen:   Amlodipine 5 mg daily  Benazepril 20 mg daily . Interventions: o Discussed low salt diet and exercising as tolerated extensively o Recommend switching to Lotrel (Amlodipine + Benazepril) 5-20 mg daily . Patient self care activities - Over the next 90 days, patient will: o Check blood pressure 3-5 times weekly document, and provide at future  appointments o Ensure daily salt intake < 2300 mg/day  Hyperlipidemia Lab Results  Component Value Date/Time   LDLCALC 164 (H) 02/11/2020 09:05 AM   . Pharmacist Clinical Goal(s): o Over the next 90 days, patient will work with PharmD and providers to achieve LDL goal < 70 . Current regimen:  o Pravastatin 20 mg daily - not taking . Interventions: o Discussed low cholesterol diet and exercising as tolerated extensively  Diabetes Lab Results  Component Value Date/Time   HGBA1C 6.0 (H) 02/11/2020 09:05 AM   HGBA1C 5.9 (A) 07/02/2019 11:12 AM   HGBA1C 5.9 12/18/2018 09:23 AM   HGBA1C 5.7 07/27/2015 12:00 AM   HGBA1C 5.9 03/27/2015 12:00 AM   . Pharmacist Clinical Goal(s): o Over the next 90 days, patient will work with PharmD and providers to maintain A1c goal <6.5% . Current regimen:  o None . Interventions: o Discussed carbohydrate counting and exercising as tolerated extensively . Patient self care activities - Over the next 90 days, patient will: o Check blood sugar once daily, document, and provide at future appointments o Contact provider with any episodes of hypoglycemia  Mood and Sleep . Pharmacist Clinical Goal(s) o Over the next 90 days, patient will work with PharmD and providers to maintain stable mood and improve sleep quality  . Current regimen:   Wellbutrin 75 mg twice daily   Cymbalta 60 mg daily  . Interventions: o Recommend changing Wellbutrin to Wellbutrin XR 300 mg daily  o  Counseled on proper sleep hygiene   Medication management . Pharmacist Clinical Goal(s): o Over the next 90 days, patient will work with PharmD and providers to maintain optimal medication adherence . Current pharmacy: [facility] . Interventions o Comprehensive medication review performed. o Continue current medication management strategy . Patient self care activities - Over the next 90 days, patient will: o Take medications as prescribed o Report any questions  or concerns to PharmD and/or provider(s)       The patient verbalized understanding of instructions, educational materials, and care plan provided today and declined offer to receive copy of patient instructions, educational materials, and care plan.   Telephone follow up appointment with pharmacy team member scheduled for: 3 months  Viona Gilmore, [facility]

## 2020-04-14 NOTE — Patient Instructions (Signed)
You can use "alpha lipoic acid" and a "B complex vitamin" for the neuropathy  Look at using "theraworx" for cramping

## 2020-04-23 DIAGNOSIS — H26491 Other secondary cataract, right eye: Secondary | ICD-10-CM | POA: Diagnosis not present

## 2020-04-24 DIAGNOSIS — D485 Neoplasm of uncertain behavior of skin: Secondary | ICD-10-CM | POA: Diagnosis not present

## 2020-04-24 DIAGNOSIS — L821 Other seborrheic keratosis: Secondary | ICD-10-CM | POA: Diagnosis not present

## 2020-04-24 DIAGNOSIS — D1801 Hemangioma of skin and subcutaneous tissue: Secondary | ICD-10-CM | POA: Diagnosis not present

## 2020-04-24 DIAGNOSIS — D225 Melanocytic nevi of trunk: Secondary | ICD-10-CM | POA: Diagnosis not present

## 2020-04-24 DIAGNOSIS — C44729 Squamous cell carcinoma of skin of left lower limb, including hip: Secondary | ICD-10-CM | POA: Diagnosis not present

## 2020-05-07 ENCOUNTER — Other Ambulatory Visit: Payer: Self-pay | Admitting: Family Medicine

## 2020-05-07 DIAGNOSIS — I674 Hypertensive encephalopathy: Secondary | ICD-10-CM

## 2020-05-07 DIAGNOSIS — H26492 Other secondary cataract, left eye: Secondary | ICD-10-CM | POA: Diagnosis not present

## 2020-05-11 DIAGNOSIS — C44729 Squamous cell carcinoma of skin of left lower limb, including hip: Secondary | ICD-10-CM | POA: Diagnosis not present

## 2020-05-14 ENCOUNTER — Telehealth: Payer: Self-pay | Admitting: Pharmacist

## 2020-05-18 NOTE — Chronic Care Management (AMB) (Signed)
Chronic Care Management Pharmacy Assistant   Name: RAYANN JOLLEY  MRN: [id number] DOB: [birth year]  Reason for Encounter: Disease State  Patient Questions:  1.  Have you seen any other providers since your last visit? Yes o 04-14-2020 Trula Slade, DPM Podiatry o 04-09-2020 Moya, Carlisle Beers, MD Ophthalmology   2.  Any changes in your medicines or health? Yes, Amlodipine-benazepril (LOTREL) 5-20 MG capsule from  tablet to 1 tablet daily   PCP : Martinique, Latriece G, MD Allergies:  No Known Allergies  Medications: Outpatient Encounter Medications as of 05/14/2020  Medication Sig  . ACCU-CHEK AVIVA PLUS test strip USE TO TEST BLOOD SUGAR ONCE DAILY  . Accu-Chek Softclix Lancets lancets Use to check blood sugar once daily  . allopurinol (ZYLOPRIM) 100 MG tablet Take 1 tablet (100 mg total) by mouth 2 (two) times daily.  Marland Kitchen amLODipine-benazepril (LOTREL) 5-20 MG capsule Take 1 capsule by mouth daily.  Marland Kitchen ascorbic acid (VITAMIN C) 250 MG CHEW Chew 500 mg by mouth daily.  Marland Kitchen b complex vitamins capsule Take 1 capsule by mouth daily.  Marland Kitchen buPROPion (WELLBUTRIN) 75 MG tablet Take 1 tablet (75 mg total) by mouth 2 (two) times daily.  . calcium carbonate (OSCAL) 1500 (600 Ca) MG TABS tablet Take 600 mg of elemental calcium by mouth at bedtime.   . clopidogrel (PLAVIX) 75 MG tablet TAKE ONE TABLET BY MOUTH DAILY  . colchicine 0.6 MG tablet TAKE ONE TABLET BY MOUTH TWICE A DAY  . diclofenac sodium (VOLTAREN) 1 % GEL Place 2 g onto the skin 4 (four) times daily as needed (pain).   . DULoxetine (CYMBALTA) 60 MG capsule TAKE ONE CAPSULE BY MOUTH DAILY  . fluticasone (FLONASE) 50 MCG/ACT nasal spray Place 2 sprays into both nostrils as needed for allergies.  (Patient not taking: Reported on 02/05/2020)  . L-Lysine 500 MG CAPS Take 500 mg by mouth at bedtime.   Elmore Guise Devices (ACCU-CHEK SOFTCLIX) lancets Use to test blood sugar once a day.  . Magnesium Citrate 100 MG TABS   . Melatonin  10 MG TBCR Take 10 mg by mouth at bedtime.   . Multiple Vitamin (MULTIVITAMIN) capsule Take 1 capsule by mouth daily.   . Omega-3 Fatty Acids (FISH OIL) 1000 MG CAPS Take 1,000 mg by mouth daily.   . pantoprazole (PROTONIX) 20 MG tablet TAKE ONE TABLET BY MOUTH DAILY  (NEW DOSE)  . polyethylene glycol (MIRALAX / GLYCOLAX) 17 g packet Take 17 g by mouth as needed.  . pravastatin (PRAVACHOL) 20 MG tablet Take 1 tablet (20 mg total) by mouth daily.  Marland Kitchen PRESCRIPTION MEDICATION Take 1 capsule by mouth 2 (two) times daily. New remedy vitamin mixture for neuropathy  . Triamcinolone Acetonide (TRIAMCINOLONE 0.1 % CREAM : EUCERIN) CREA Apply 1 application topically 2 (two) times daily as needed for rash or itching.  . vitamin E 400 UNIT capsule Take 400 Units by mouth daily.    No facility-administered encounter medications on file as of 05/14/2020.    Current Diagnosis: Patient Active Problem List   Diagnosis Date Noted  . Myalgia due to statin 02/11/2020  . Elbow dislocation 07/02/2019  . Generalized osteoarthritis of multiple sites 06/26/2018  . Gout, arthropathy 01/22/2018  . Lower back pain 09/22/2017  . Unstable gait 09/22/2017  . Seborrheic dermatitis 01/23/2017  . Seborrheic keratosis 09/27/2016  . CKD (chronic kidney disease), stage III ([place]) 05/28/2016  . GERD (gastroesophageal reflux disease) 02/25/2016  . CVA (cerebral vascular accident) (  HCC)-No residual deficit 02/12/2016  . Glaucoma 02/12/2016  . Depression, major, recurrent ([place]) 02/12/2016  . Hyperlipidemia associated with type 2 diabetes mellitus ([place]) 02/12/2016  . OSA (obstructive sleep apnea) 02/12/2016  . Vitamin D deficiency 01/14/2016  . DM (diabetes mellitus), type 2 with neurological complications ([place]) 82/99/3716  . Systolic hypertension with cerebrovascular disease 01/14/2016  . Peripheral neuropathy 01/14/2016    Goals Addressed   None    Reviewed chart prior to disease state call. Spoke with patient  regarding BP  Recent Office Vitals: BP Readings from Last 3 Encounters:  02/11/20 124/76  11/12/19 128/80  08/07/19 (!) 150/90   Pulse Readings from Last 3 Encounters:  02/11/20 93  11/12/19 84  08/07/19 86    Wt Readings from Last 3 Encounters:  02/11/20 160 lb 6.4 oz (72.8 kg)  11/12/19 162 lb (73.5 kg)  08/07/19 159 lb 6 oz (72.3 kg)     Kidney Function Lab Results  Component Value Date/Time   CREATININE 1.00 (H) 02/11/2020 09:05 AM   CREATININE 0.96 08/07/2019 11:14 AM   CREATININE 1.01 07/02/2019 08:58 AM   CREATININE 0.92 07/21/2017 04:02 PM   GFR 56.34 (L) 08/07/2019 11:14 AM   GFRNONAA 54 (L) 02/11/2020 09:05 AM   GFRAA 63 02/11/2020 09:05 AM    BMP Latest Ref Rng & Units 02/11/2020 08/07/2019 07/02/2019  Glucose 65 - 99 mg/dL 115(H) 126(H) 177(H)  BUN 7 - 25 mg/dL 18 18 17   Creatinine 0.60 - 0.93 mg/dL 1.00(H) 0.96 1.01  BUN/Creat Ratio 6 - 22 (calc) 18 - -  Sodium 135 - 146 mmol/L 139 140 140  Potassium 3.5 - 5.3 mmol/L 4.5 4.7 4.0  Chloride 98 - 110 mmol/L 106 104 103  CO2 20 - 32 mmol/L 26 29 28   Calcium 8.6 - 10.4 mg/dL 10.3 10.1 9.9    . Current antihypertensive regimen:  o Amlodipine-benazepril (LOTREL) 5-20 MG to 1 tablet daily . How often are you checking your Blood Pressure? daily . Current home BP readings:  o 02/18 134/75 o 02/19 133/73 o 02/20 150/81 o 02/21 124/72 . What recent interventions/DTPs have been made by any provider to improve Blood Pressure control since last CPP Visit: None . Any recent hospitalizations or ED visits since last visit with CPP? No . What diet changes have been made to improve Blood Pressure Control?  o No Change . What exercise is being done to improve your Blood Pressure Control?  o No Change  Adherence Review: Is the patient currently on ACE/ARB medication? Yes Does the patient have >5 day gap between last estimated fill dates? No  Follow-Up:  Pharmacist Review  I spoke with the patient and  discussed  medication adherence with the patient, no issues presently with her medication. The patient denies any side effects with his medication. Also, she denies any problems with her current pharmacy.  Maia Breslow, [place] Assistant [phone]

## 2020-06-01 ENCOUNTER — Ambulatory Visit
Admission: RE | Admit: 2020-06-01 | Discharge: 2020-06-01 | Disposition: A | Discharge status: Home / Self Care | Payer: Medicare Other | Source: Ambulatory Visit | Attending: Family Medicine | Admitting: Family Medicine

## 2020-06-01 ENCOUNTER — Other Ambulatory Visit: Payer: Self-pay

## 2020-06-01 DIAGNOSIS — Z1231 Encounter for screening mammogram for malignant neoplasm of breast: Secondary | ICD-10-CM | POA: Diagnosis not present

## 2020-06-16 ENCOUNTER — Other Ambulatory Visit: Payer: Self-pay | Admitting: Family Medicine

## 2020-06-24 ENCOUNTER — Telehealth: Payer: Self-pay | Admitting: Pharmacist

## 2020-06-24 NOTE — Chronic Care Management (AMB) (Signed)
    Chronic Care Management Pharmacy Assistant   Name: Alexa Brown  MRN: [id number] DOB: [birth year]  03.30.31- Called patient to reschedule appointment with [name] for tomorrow at 1 pm. I left a message to call back.  03.31.31-I spoke with the patient and rescheduled her appointment from today to April  12th at 3:30 PM  Medications: Outpatient Encounter Medications as of 06/24/2020  Medication Sig  . ACCU-CHEK AVIVA PLUS test strip USE TO TEST BLOOD SUGAR ONCE DAILY  . Accu-Chek Softclix Lancets lancets Use to check blood sugar once daily  . allopurinol (ZYLOPRIM) 100 MG tablet Take 1 tablet (100 mg total) by mouth 2 (two) times daily.  Marland Kitchen amLODipine-benazepril (LOTREL) 5-20 MG capsule Take 1 capsule by mouth daily.  Marland Kitchen ascorbic acid (VITAMIN C) 250 MG CHEW Chew 500 mg by mouth daily.  Marland Kitchen b complex vitamins capsule Take 1 capsule by mouth daily.  Marland Kitchen buPROPion (WELLBUTRIN) 75 MG tablet TAKE ONE TABLET BY MOUTH TWICE A DAY  . calcium carbonate (OSCAL) 1500 (600 Ca) MG TABS tablet Take 600 mg of elemental calcium by mouth at bedtime.   . clopidogrel (PLAVIX) 75 MG tablet TAKE ONE TABLET BY MOUTH DAILY  . colchicine 0.6 MG tablet TAKE ONE TABLET BY MOUTH TWICE A DAY  . diclofenac sodium (VOLTAREN) 1 % GEL Place 2 g onto the skin 4 (four) times daily as needed (pain).   . DULoxetine (CYMBALTA) 60 MG capsule TAKE ONE CAPSULE BY MOUTH DAILY  . fluticasone (FLONASE) 50 MCG/ACT nasal spray Place 2 sprays into both nostrils as needed for allergies.  (Patient not taking: Reported on 02/05/2020)  . L-Lysine 500 MG CAPS Take 500 mg by mouth at bedtime.   Elmore Guise Devices (ACCU-CHEK SOFTCLIX) lancets Use to test blood sugar once a day.  . Magnesium Citrate 100 MG TABS   . Melatonin 10 MG TBCR Take 10 mg by mouth at bedtime.   . Multiple Vitamin (MULTIVITAMIN) capsule Take 1 capsule by mouth daily.   . Omega-3 Fatty Acids (FISH OIL) 1000 MG CAPS Take 1,000 mg by mouth daily.   .  pantoprazole (PROTONIX) 20 MG tablet TAKE ONE TABLET BY MOUTH DAILY  (NEW DOSE)  . polyethylene glycol (MIRALAX / GLYCOLAX) 17 g packet Take 17 g by mouth as needed.  . pravastatin (PRAVACHOL) 20 MG tablet Take 1 tablet (20 mg total) by mouth daily.  Marland Kitchen PRESCRIPTION MEDICATION Take 1 capsule by mouth 2 (two) times daily. New remedy vitamin mixture for neuropathy  . Triamcinolone Acetonide (TRIAMCINOLONE 0.1 % CREAM : EUCERIN) CREA Apply 1 application topically 2 (two) times daily as needed for rash or itching.  . vitamin E 400 UNIT capsule Take 400 Units by mouth daily.    No facility-administered encounter medications on file as of 06/24/2020.    Alexa Brown, [place] [phone]

## 2020-06-25 ENCOUNTER — Telehealth: Payer: Medicare Other

## 2020-07-07 ENCOUNTER — Telehealth: Payer: Self-pay | Admitting: Pharmacist

## 2020-07-07 NOTE — Chronic Care Management (AMB) (Signed)
I spoke with the patient about her upcoming appointment on 07/08/2020 @ 12:00 PM with the clinical pharmacist. She was asked to please have all medication on hand to review with the pharmacist. She confirmed the appointment.   Alexa Brown, Alexa Brown [phone]

## 2020-07-08 ENCOUNTER — Ambulatory Visit (INDEPENDENT_AMBULATORY_CARE_PROVIDER_SITE_OTHER): Payer: Medicare Other | Admitting: Pharmacist

## 2020-07-08 DIAGNOSIS — I674 Hypertensive encephalopathy: Secondary | ICD-10-CM

## 2020-07-08 DIAGNOSIS — E1149 Type 2 diabetes mellitus with other diabetic neurological complication: Secondary | ICD-10-CM | POA: Diagnosis not present

## 2020-07-08 DIAGNOSIS — E1169 Type 2 diabetes mellitus with other specified complication: Secondary | ICD-10-CM | POA: Diagnosis not present

## 2020-07-08 DIAGNOSIS — E785 Hyperlipidemia, unspecified: Secondary | ICD-10-CM

## 2020-07-08 NOTE — Progress Notes (Signed)
Chronic Care Management Pharmacy Note  07/23/2020 Name:  LYNEE ROSENBACH MRN:  [id number] DOB:  10-13-1940  Subjective: AUDREE SCHRECENGOST is an 80 y.o. year old female who is a primary patient of Martinique, Malka So, MD.  The CCM team was consulted for assistance with disease management and care coordination needs.    Engaged with patient by telephone for follow up visit in response to provider referral for pharmacy case management and/or care coordination services.   Consent to Services:  The patient was given information about Chronic Care Management services, agreed to services, and gave verbal consent prior to initiation of services.  Please see initial visit note for detailed documentation.   Patient Care Team: Martinique, Fedra G, MD as PCP - General (Family Medicine) Viona Gilmore, [facility] as Pharmacist (Pharmacist)  Recent office visits: 02/11/20 Zeniah Martinique, MD: Patient presented for follow up. Started Pravastatin 20 mg daily. 02/05/20 Charlott Rakes, LPN: Patient presented for medicare annual wellness visit. 11/12/19: Patient presented to Dr. Martinique for follow-up. Amlodipine increased to 5 mg daily.   Recent consult visits: 05/07/20 Jovita Kussmaul, MD ([facility]): Patient presented for eye procedure.  04/14/20 Celesta Gentile, DPM (podiatry): Patient presented for nail debridement.  Hospital visits: None in previous 6 months  Objective:  Lab Results  Component Value Date   CREATININE 1.03 07/14/2020   BUN 19 07/14/2020   GFR 52.24 (L) 07/14/2020   GFRNONAA 54 (L) 02/11/2020   GFRAA 63 02/11/2020   NA 139 07/14/2020   K 4.3 07/14/2020   CALCIUM 10.3 07/14/2020   CO2 27 07/14/2020   GLUCOSE 164 (H) 07/14/2020    Lab Results  Component Value Date/Time   HGBA1C 6.2 07/14/2020 09:17 AM   HGBA1C 6.0 (H) 02/11/2020 09:05 AM   HGBA1C 5.7 07/27/2015 12:00 AM   HGBA1C 5.9 03/27/2015 12:00 AM   FRUCTOSAMINE 253 12/18/2018 09:23 AM   GFR 52.24 (L) 07/14/2020 09:17 AM    GFR 56.34 (L) 08/07/2019 11:14 AM   MICROALBUR 1.1 07/14/2020 09:17 AM   MICROALBUR 0.8 01/01/2019 10:56 AM    Last diabetic Eye exam: No results found for: HMDIABEYEEXA  Last diabetic Foot exam: No results found for: HMDIABFOOTEX   Lab Results  Component Value Date   CHOL 263 (H) 02/11/2020   HDL 77 02/11/2020   LDLCALC 164 (H) 02/11/2020   TRIG 106 02/11/2020   CHOLHDL 3.4 02/11/2020    Hepatic Function Latest Ref Rng & Units 02/11/2020 12/18/2018 01/22/2018  Total Protein 6.1 - 8.1 g/dL 6.5 6.7 6.7  Albumin 3.5 - 5.2 g/dL - 4.2 4.2  AST 10 - 35 U/L '20 22 22  ' ALT 6 - 29 U/L '18 17 18  ' Alk Phosphatase 39 - 117 U/L - 60 63  Total Bilirubin 0.2 - 1.2 mg/dL 0.5 0.6 0.5    Lab Results  Component Value Date/Time   TSH 2.87 12/18/2018 09:23 AM   TSH 1.41 10/30/2017 12:00 AM    CBC Latest Ref Rng & Units 12/18/2018 01/30/2018 07/27/2015  WBC 4.0 - 10.5 K/uL 5.4 7.0 5.2  Hemoglobin 12.0 - 15.0 g/dL 14.4 13.0 14.2  Hematocrit 36.0 - 46.0 % 41.8 39.0 43  Platelets 150.0 - 400.0 K/uL 230.0 237 235    Lab Results  Component Value Date/Time   VD25OH 67 07/21/2017 04:02 PM   VD25OH 43.34 01/23/2017 01:08 PM   VD25OH 54.05 01/14/2016 03:49 PM    Clinical ASCVD: Yes  The ASCVD Risk score Mikey Bussing DC Brooke Bonito., et al.,  2013) failed to calculate for the following reasons:   The patient has a prior MI or stroke diagnosis    Depression screen [facility] 2/9 02/05/2020 08/07/2019 01/30/2019  Decreased Interest 0 3 1  Down, Depressed, Hopeless 0 2 0  PHQ - 2 Score 0 5 1  Altered sleeping - 2 -  Tired, decreased energy - 3 -  Change in appetite - 3 -  Feeling bad or failure about yourself  - 3 -  Trouble concentrating - 2 -  Moving slowly or fidgety/restless - 1 -  Suicidal thoughts - 1 -  PHQ-9 Score - 20 -  Difficult doing work/chores - Somewhat difficult -      Social History   Tobacco Use  Smoking Status Never Smoker  Smokeless Tobacco Never Used   BP Readings from Last 3 Encounters:   07/14/20 122/74  02/11/20 124/76  11/12/19 128/80   Pulse Readings from Last 3 Encounters:  07/14/20 98  02/11/20 93  11/12/19 84   Wt Readings from Last 3 Encounters:  07/14/20 160 lb 9.6 oz (72.8 kg)  02/11/20 160 lb 6.4 oz (72.8 kg)  11/12/19 162 lb (73.5 kg)   BMI Readings from Last 3 Encounters:  07/14/20 25.92 kg/m  02/11/20 25.89 kg/m  11/12/19 26.15 kg/m    Assessment/Interventions: Review of patient past medical history, allergies, medications, health status, including review of consultants reports, laboratory and other test data, was performed as part of comprehensive evaluation and provision of chronic care management services.   SDOH:  (Social Determinants of Health) assessments and interventions performed: No  SDOH Screenings   Alcohol Screen: Not on file  Depression (PHQ2-9): Low Risk   . PHQ-2 Score: 0  Financial Resource Strain: Low Risk   . Difficulty of Paying Living Expenses: Not hard at all  Food Insecurity: No Food Insecurity  . Worried About Charity fundraiser in the Last Year: Never true  . Ran Out of Food in the Last Year: Never true  Housing: Low Risk   . Last Housing Risk Score: 0  Physical Activity: Insufficiently Active  . Days of Exercise per Week: 2 days  . Minutes of Exercise per Session: 30 min  Social Connections: Moderately Integrated  . Frequency of Communication with Friends and Family: More than three times a week  . Frequency of Social Gatherings with Friends and Family: Once a week  . Attends Religious Services: More than 4 times per year  . Active Member of Clubs or Organizations: Yes  . Attends Archivist Meetings: 1 to 4 times per year  . Marital Status: Widowed  Stress: No Stress Concern Present  . Feeling of Stress : Not at all  Tobacco Use: Low Risk   . Smoking Tobacco Use: Never Smoker  . Smokeless Tobacco Use: Never Used  Transportation Needs: No Transportation Needs  . Lack of Transportation (Medical):  No  . Lack of Transportation (Non-Medical): No    CCM Care Plan  No Known Allergies  Medications Reviewed Today    Reviewed by Martinique, Deborah G, MD (Physician) on 07/14/20 at [place] [facility] [place] List Status: <None>  Medication Order Taking? Sig Documenting Provider Last Dose Status Informant  ACCU-CHEK AVIVA PLUS test strip [id number] No USE TO TEST BLOOD SUGAR ONCE DAILY Martinique, Skylor G, MD Taking Active   Accu-Chek Softclix Lancets lancets [id number] No Use to check blood sugar once daily Martinique, Lyana G, MD Taking Active   allopurinol (ZYLOPRIM) 100 MG tablet [id number] No  Take 1 tablet (100 mg total) by mouth 2 (two) times daily. Martinique, Graycee G, MD Taking Active   Alpha-Lipoic Acid 600 MG TABS [id number] No Take 1 tablet by mouth daily. [provider] Taking Active         Discontinued 07/14/20 0849   amLODipine-olmesartan (AZOR) 5-20 MG tablet [id number] Yes Take 1 tablet by mouth daily. Martinique, Tinamarie G, MD  Active   ascorbic acid (VITAMIN C) 250 MG CHEW [id number] No Chew 500 mg by mouth daily. [provider] Taking Active   b complex vitamins capsule [id number] No Take 1 capsule by mouth daily. [provider] Taking Active   buPROPion [facility]) 75 MG tablet [id number]  TAKE ONE TABLET BY MOUTH TWICE A DAY Martinique, Nupur G, MD  Active   calcium carbonate (OSCAL) 1500 (600 Ca) MG TABS tablet [id number] No Take 600 mg of elemental calcium by mouth at bedtime.  [provider] Taking Active Child  clopidogrel (PLAVIX) 75 MG tablet [id number]  TAKE ONE TABLET BY MOUTH DAILY Martinique, Shivonne G, MD  Active   colchicine 0.6 MG tablet [id number] No TAKE ONE TABLET BY MOUTH TWICE A DAY Martinique, Evelyna G, MD Taking Active Child  diclofenac sodium (VOLTAREN) 1 % GEL [id number] No Place 2 g onto the skin 4 (four) times daily as needed (pain).  [provider] Taking Active Child  DULoxetine (CYMBALTA) 60 MG capsule [id number]  TAKE ONE CAPSULE BY MOUTH DAILY Martinique, Clarene G,  MD  Active   fluticasone [facility]) 50 MCG/ACT nasal spray [id number] No Place 2 sprays into both nostrils as needed for allergies. [provider] Taking Active Child  Homeopathic Products ([place]) [id number] No Apply 1 application topically. [provider] Taking Active   L-Lysine 500 MG CAPS [id number] No Take 500 mg by mouth at bedtime.  [provider] Taking Active Child  Lancet Devices (ACCU-CHEK [facility]) lancets [id number] No Use to test blood sugar once a day. Martinique, Aleeta G, MD Taking Active Child  Magnesium Citrate 100 MG TABS [id number] No  [provider] Taking Active   Melatonin 10 MG TBCR [id number] No Take 10 mg by mouth at bedtime.  [provider] Taking Active Child  Multiple Vitamin (MULTIVITAMIN) capsule [id number] No Take 1 capsule by mouth daily.  [provider] Taking Active Child  Omega-3 Fatty Acids (FISH OIL) 1000 MG CAPS [id number] No Take 1,000 mg by mouth daily.  [provider] Taking Active Child  pantoprazole (PROTONIX) 20 MG tablet [id number] No TAKE ONE TABLET BY MOUTH DAILY  (NEW DOSE) Martinique, Martina G, MD Taking Active   polyethylene glycol (MIRALAX / GLYCOLAX) 17 g packet [id number] No Take 17 g by mouth as needed. [provider] Taking Active Self  pravastatin (PRAVACHOL) 20 MG tablet [id number]  Take 1 tablet (20 mg total) by mouth daily. Martinique, Averianna G, MD  Active   PRESCRIPTION MEDICATION [id number] No Take 1 capsule by mouth 2 (two) times daily. New remedy vitamin mixture for neuropathy [provider] Taking Active Child  Triamcinolone Acetonide (TRIAMCINOLONE 0.1 % CREAM : EUCERIN) CREA [id number] No Apply 1 application topically 2 (two) times daily as needed for rash or itching. Martinique, Sumner G, MD Taking Active Child  vitamin E 400 UNIT capsule [id number] No Take 400 Units by mouth daily.  [provider] Taking Active Child          Patient Active Problem  List   Diagnosis Date Noted  . Myalgia due  to statin 02/11/2020  . Elbow dislocation 07/02/2019  . Generalized osteoarthritis of multiple sites 06/26/2018  . Gout, arthropathy 01/22/2018  . Lower back pain 09/22/2017  . Unstable gait 09/22/2017  . Seborrheic dermatitis 01/23/2017  . Seborrheic keratosis 09/27/2016  . CKD (chronic kidney disease), stage III ([place]) 05/28/2016  . GERD (gastroesophageal reflux disease) 02/25/2016  . CVA (cerebral vascular accident) (HCC)-No residual deficit 02/12/2016  . Glaucoma 02/12/2016  . Depression, major, recurrent ([place]) 02/12/2016  . Hyperlipidemia associated with type 2 diabetes mellitus ([place]) 02/12/2016  . OSA (obstructive sleep apnea) 02/12/2016  . Vitamin D deficiency 01/14/2016  . DM (diabetes mellitus), type 2 with neurological complications ([place]) 37/48/2707  . Systolic hypertension with cerebrovascular disease 01/14/2016  . Peripheral neuropathy 01/14/2016    Immunization History  Administered Date(s) Administered  . Hepatitis B, adult 01/22/2018  . Influenza Whole 02/01/2016  . Influenza, High Dose Seasonal PF 01/14/2016, 11/17/2018  . Influenza-Unspecified 01/12/2017, 12/26/2017, 12/13/2019  . PFIZER(Purple Top)SARS-COV-2 Vaccination 05/03/2019, 05/28/2019, 01/11/2020  . Tdap 12/24/2015  . Zoster Recombinat (Shingrix) 08/16/2016, 01/12/2017    Conditions to be addressed/monitored:  Hypertension, Hyperlipidemia, Diabetes, GERD, Chronic Kidney Disease, Depression, Osteoarthritis and Gout  Care Plan : CCM Pharmacy Care Plan  Updates made by Viona Gilmore, [place] since 07/23/2020 12:00 AM    Problem: Problem: Hypertension, Hyperlipidemia, Diabetes, GERD, Chronic Kidney Disease, Depression, Osteoarthritis and Gout     Long-Range Goal: Patient-Specific Goal   Start Date: 07/08/2020  Expected End Date: 07/08/2021  This Visit's Progress: On track  Priority: High  Note:   Current Barriers:  . Unable to independently monitor  therapeutic efficacy . Unable to achieve control of cholesterol  . Unable to maintain control of blood pressure  Pharmacist Clinical Goal(s):  Marland Kitchen Patient will achieve adherence to monitoring guidelines and medication adherence to achieve therapeutic efficacy . achieve control of cholesterol as evidenced by next lipid panel . maintain control of blood pressure as evidenced by home blood pressure readings  through collaboration with PharmD and provider.   Interventions: . 1:1 collaboration with Martinique, Olean G, MD regarding development and update of comprehensive plan of care as evidenced by provider attestation and co-signature . Inter-disciplinary care team collaboration (see longitudinal plan of care) . Comprehensive medication review performed; medication list updated in electronic medical record  Hypertension (BP goal <140/90) -Not ideally controlled -Current treatment: . Amlodipine-benazepril 5-4m 1 capsule daily (8 left) -Medications previously tried: n/a  -Current home readings: 150/76 (since April 6th) -Current dietary habits: mediterranean diet -Current exercise habits: did not discuss -Denies hypotensive/hypertensive symptoms -Educated on Importance of home blood pressure monitoring; Proper BP monitoring technique; Symptoms of hypotension and importance of maintaining adequate hydration; -Counseled to monitor BP at home a few times weekly, document, and provide log at future appointments -Counseled on diet and exercise extensively Recommended to continue current medication  Hyperlipidemia/CAD: (LDL goal < 70) -Uncontrolled -Current treatment: . Clopidogrel 75 mg daily -Medications previously tried: Atorvastatin, pravastatin (foot cramping, fear of side effects) -Current dietary patterns: restarted mediterranean diet -Current exercise habits: patient reports she needs to restart walking again and chair yoga -Educated on Cholesterol goals;  Importance of limiting foods  high in cholesterol; -Counseled on diet and exercise extensively Discussed other options for cholesterol therapy.  Diabetes (A1c goal <7%) -Controlled -Current medications: . No medications -Medications previously tried: Invokana  -Current home glucose readings . fasting glucose: 119, 238 (morning - ice cream for dinner), 133, 139, 145, 134 . post prandial glucose: none -Denies  hypoglycemic/hyperglycemic symptoms -Current meal patterns:  . breakfast: did not discuss  . lunch: did not discuss   . dinner: did not discuss  . snacks: did not discuss  . drinks: did not discuss  -Current exercise: patient needs to restart chair yoga -Educated on A1c and blood sugar goals; Exercise goal of 150 minutes per week; Benefits of routine self-monitoring of blood sugar; -Counseled to check feet daily and get yearly eye exams -Counseled on diet and exercise extensively  Depression/Anxiety (Goal: minimize symptoms) -Controlled -Current treatment:  Wellbutrin 75 mg twice daily   Cymbalta 60 mg daily -Medications previously tried/failed: none -PHQ9: 0 -GAD7: n/a -Educated on Benefits of medication for symptom control -Recommended to continue current medication  Insomnia (Goal: improve quality and quantity of sleep) -Controlled -Current treatment  . Melatonin 10 mg QHS (does not take Fridays and saturdays) -Medications previously tried: none  -Recommended to continue current medication  Gout (Goal: uric acid < 6 and prevent flare ups) -Controlled -Current treatment  . Allopurinol 100 mg twice daily  -Medications previously tried: none  -Recommended repeat uric acid level  Bone health (Goal prevent fractures) -Controlled -Last DEXA Scan: 11/17/16              T-Score femoral neck: -0.5             T-Score total hip: n/a             T-Score lumbar spine: +0.0             T-Score forearm radius: n/a             10-year probability of major osteoporotic fracture: n/a              10-year probability of hip fracture: n/a -Patient is not a candidate for pharmacologic treatment -Current treatment  . Calcium Carbonate 600 mg QHS + 800 units of D -Medications previously tried: none  -Recommend [id number] units of vitamin D daily. Recommend 1200 mg of calcium daily from dietary and supplemental sources. Recommend weight-bearing and muscle strengthening exercises for building and maintaining bone density. -Recommended to continue current medication  GERD (Goal: minimize symptoms) -Controlled -Current treatment  . Pantoprazole 20 mg daily -Medications previously tried: none  -Recommended to continue current medication  Allergic rhinitis (Goal: minimize symptoms) -Controlled -Current treatment  . Mucinex 600 mg 1 tablet daily -Medications previously tried: none  -Recommended to continue current medication   Health Maintenance -Vaccine gaps: none -Current therapy:   Voltaren 1% gel   L-Lysine 1000 mg daily (Cold sores)   Magnesium Citrate 100 mg   Miralax 17 g daily PRN  Triamcinolone cream  Vitamin E 400 units daily   NeuRemedy (Fenfotiamine 300 mg 1 tablet twice daily)   Equate Womens 50+ multivitamin daily  Vitamin C 250 mg 2 chewables daily -Educated on Cost vs benefit of each product must be carefully weighed by individual consumer -Patient is satisfied with current therapy and denies issues -Recommended to continue current medication  Patient Goals/Self-Care Activities . Patient will:  - take medications as prescribed check glucose daily, document, and provide at future appointments check blood pressure a few times weekly, document, and provide at future appointments target a minimum of 150 minutes of moderate intensity exercise weekly engage in dietary modifications by increasing fiber intake  Follow Up Plan: Telephone follow up appointment with care management team member scheduled for: 3 months        Medication Assistance: None  required.  Patient affirms  current coverage meets needs.  Patient's preferred pharmacy is:  [facility] [address] [place] [address] [place] [address] Phone: [phone] Fax: [phone]  [facility] [place], [place] - [place] [facility] [address] [place] [address] Phone: [phone] Fax: [phone]  Uses pill box? Yes - daughter fills for her Pt endorses 100% compliance  We discussed: Current pharmacy is preferred with insurance plan and patient is satisfied with pharmacy services Patient decided to: Continue current medication management strategy  Care Plan and Follow Up Patient Decision:  Patient agrees to Care Plan and Follow-up.  Plan: Telephone follow up appointment with care management team member scheduled for:  3 months  [name], PharmD [place] Pharmacist [place] at [place] [phone]

## 2020-07-13 ENCOUNTER — Telehealth: Payer: Medicare Other

## 2020-07-14 ENCOUNTER — Other Ambulatory Visit: Payer: Self-pay

## 2020-07-14 ENCOUNTER — Encounter: Payer: Self-pay | Admitting: Family Medicine

## 2020-07-14 ENCOUNTER — Ambulatory Visit (INDEPENDENT_AMBULATORY_CARE_PROVIDER_SITE_OTHER): Payer: Medicare Other | Admitting: Family Medicine

## 2020-07-14 ENCOUNTER — Ambulatory Visit (INDEPENDENT_AMBULATORY_CARE_PROVIDER_SITE_OTHER): Payer: Medicare Other

## 2020-07-14 VITALS — BP 122/74 | HR 98 | Resp 16 | Ht 66.0 in | Wt 160.6 lb

## 2020-07-14 DIAGNOSIS — N1831 Chronic kidney disease, stage 3a: Secondary | ICD-10-CM

## 2020-07-14 DIAGNOSIS — R059 Cough, unspecified: Secondary | ICD-10-CM

## 2020-07-14 DIAGNOSIS — M549 Dorsalgia, unspecified: Secondary | ICD-10-CM

## 2020-07-14 DIAGNOSIS — E785 Hyperlipidemia, unspecified: Secondary | ICD-10-CM

## 2020-07-14 DIAGNOSIS — Z1159 Encounter for screening for other viral diseases: Secondary | ICD-10-CM

## 2020-07-14 DIAGNOSIS — I674 Hypertensive encephalopathy: Secondary | ICD-10-CM

## 2020-07-14 DIAGNOSIS — E1169 Type 2 diabetes mellitus with other specified complication: Secondary | ICD-10-CM

## 2020-07-14 DIAGNOSIS — E1149 Type 2 diabetes mellitus with other diabetic neurological complication: Secondary | ICD-10-CM | POA: Diagnosis not present

## 2020-07-14 DIAGNOSIS — R053 Chronic cough: Secondary | ICD-10-CM

## 2020-07-14 DIAGNOSIS — Q766 Other congenital malformations of ribs: Secondary | ICD-10-CM | POA: Diagnosis not present

## 2020-07-14 DIAGNOSIS — R2681 Unsteadiness on feet: Secondary | ICD-10-CM

## 2020-07-14 LAB — BASIC METABOLIC PANEL
BUN: 19 mg/dL (ref 6–23)
CO2: 27 mEq/L (ref 19–32)
Calcium: 10.3 mg/dL (ref 8.4–10.5)
Chloride: 103 mEq/L (ref 96–112)
Creatinine, Ser: 1.03 mg/dL (ref 0.40–1.20)
GFR: 52.24 mL/min — ABNORMAL LOW (ref >60.00)
Glucose, Bld: 164 mg/dL — ABNORMAL HIGH (ref 70–99)
Potassium: 4.3 mEq/L (ref 3.5–5.1)
Sodium: 139 mEq/L (ref 135–145)

## 2020-07-14 LAB — MICROALBUMIN / CREATININE URINE RATIO
Creatinine,U: 133.3 mg/dL
Microalb Creat Ratio: 0.8 mg/g (ref 0.0–30.0)
Microalb, Ur: 1.1 mg/dL (ref 0.0–1.9)

## 2020-07-14 LAB — HEMOGLOBIN A1C: Hgb A1c MFr Bld: 6.2 % (ref 4.6–6.5)

## 2020-07-14 MED ORDER — AMLODIPINE-OLMESARTAN 5-20 MG PO TABS: 1.0000 | ORAL_TABLET | Freq: Every day | ORAL | 0 refills | Status: DC

## 2020-07-14 NOTE — Assessment & Plan Note (Signed)
BP is adequately controlled but because cough, Benazepril discontinued. She agrees with changing to Olmesartan 20 mg daily. No changes in Amlodipine dose. Amlodipine-olmesartan 5-20 mg started today. Continue monitoring BP regularly.

## 2020-07-14 NOTE — Patient Instructions (Addendum)
A few things to remember from today's visit:   Encounter for HCV screening test for low risk patient - Plan: Hepatitis C antibody  DM (diabetes mellitus), type 2 with neurological complications ([place]) - Plan: Basic metabolic panel, Hemoglobin A1c  Stage 3a chronic kidney disease ([place]) - Plan: Basic metabolic panel, Microalbumin / creatinine urine ratio  Hyperlipidemia associated with type 2 diabetes mellitus ([place])  Upper back pain  Persistent cough - Plan: DG Chest 2 View  If you need refills please call your pharmacy. Do not use My Chart to request refills or for acute issues that need immediate attention.   Today blood pressure medication changed, benazepril may be causing the cough. Back pain seems to be muscular. Tylenol 500 mg 3-4 times daily, local icy hot.  Continue low fat diet, we will check cholesterol next visit.  Please be sure medication list is accurate. If a new problem present, please set up appointment sooner than planned today.

## 2020-07-14 NOTE — Progress Notes (Signed)
Chief Complaint  Patient presents with  . Back Pain  . Shoulder Pain   HPI: [name] is a 80 y.o. female, who is here today with her daughter with above complaint. She is reporting this as a new problem. 4 days ago she started with severe bilateral upper back pain. Gradual onset. "So bad" that she could not move. Constant dull pain, today 2/10, it has improved. Intermittent sharp pain with movement and deep breathing, it was 9-10/10. No recent injury or unusual physical activity. She has not noted rash on affected area. No fever,chills,changes in appetite,sore throat,or dysphagia.  She has had cough but according to her daughter she "always" has a non productive cough. She feels like a "tickle" in her throat. No associated SOB,wheezing,or CP. GERD on Pantoprazole 20 mg daily. She has not had heartburn.  DM II: Dx'ed in 2016. She is on non pharmacologic treatment. BS's 100-120's. Negative for polydipsia,polyuria, or polyphagia. 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 Unstable gait, assisted with a cane. No recent falls.  Lab Results  Component Value Date   HGBA1C 6.0 (H) 02/11/2020   HLD: She stopped Pravastatin because it was causing myalgias. She is following mediterranean diet. She has tried other statins in the apst with similar side effects.  Lab Results  Component Value Date   CHOL 263 (H) 02/11/2020   HDL 77 02/11/2020   LDLCALC 164 (H) 02/11/2020   TRIG 106 02/11/2020   CHOLHDL 3.4 02/11/2020   HTN: Dx'ed in 1994. Negative for severe/frequent headache, visual changes, chest pain, dyspnea, palpitation,new focal weakness, or edema. She is on Amlodipine-Benazepril 5-20 mg daily. BP readings at home 120-130's/70's. + CVA in 1994. She is on Plavix 75 mg daily. CKD III: She has not noted foam in urine,gross hematuria,or decreased urine output.  Lab Results  Component Value Date    CREATININE 1.00 (H) 02/11/2020   BUN 18 02/11/2020   NA 139 02/11/2020   K 4.5 02/11/2020   CL 106 02/11/2020   CO2 26 02/11/2020   Review of Systems  Constitutional: Positive for fatigue. Negative for chills and diaphoresis.  HENT: Negative for mouth sores, nosebleeds and sore throat.   Gastrointestinal: Negative for abdominal pain, nausea and vomiting.       Negative for changes in bowel habits.  Musculoskeletal: Positive for gait problem.  Skin: Negative for pallor and rash.  Neurological: Negative for syncope and facial asymmetry.  Psychiatric/Behavioral: Negative for confusion.  Rest see pertinent positives and negatives per HPI.  Current Outpatient Medications on File Prior to Visit  Medication Sig Dispense Refill  . ACCU-CHEK AVIVA PLUS test strip USE TO TEST BLOOD SUGAR ONCE DAILY 100 strip 1  . Accu-Chek Softclix Lancets lancets Use to check blood sugar once daily 100 each 2  . allopurinol (ZYLOPRIM) 100 MG tablet Take 1 tablet (100 mg total) by mouth 2 (two) times daily. 180 tablet 3  . Alpha-Lipoic Acid 600 MG TABS Take 1 tablet by mouth daily.    Marland Kitchen ascorbic acid (VITAMIN C) 250 MG CHEW Chew 500 mg by mouth daily.    Marland Kitchen b complex vitamins capsule Take 1 capsule by mouth daily.    Marland Kitchen buPROPion (WELLBUTRIN) 75 MG tablet TAKE ONE TABLET BY MOUTH TWICE A DAY 180 tablet 1  . calcium carbonate (OSCAL) 1500 (600 Ca) MG TABS tablet Take 600 mg of elemental calcium by mouth at bedtime.     . clopidogrel (PLAVIX) 75 MG tablet TAKE  ONE TABLET BY MOUTH DAILY 90 tablet 3  . colchicine 0.6 MG tablet TAKE ONE TABLET BY MOUTH TWICE A DAY 30 tablet 0  . diclofenac sodium (VOLTAREN) 1 % GEL Place 2 g onto the skin 4 (four) times daily as needed (pain).     . DULoxetine (CYMBALTA) 60 MG capsule TAKE ONE CAPSULE BY MOUTH DAILY 90 capsule 2  . fluticasone (FLONASE) 50 MCG/ACT nasal spray Place 2 sprays into both nostrils as needed for allergies.    . Homeopathic Products (THERAWORX RELIEF EX)  Apply 1 application topically.    Marland Kitchen L-Lysine 500 MG CAPS Take 500 mg by mouth at bedtime.     Elmore Guise Devices (ACCU-CHEK SOFTCLIX) lancets Use to test blood sugar once a day. 100 each 2  . Magnesium Citrate 100 MG TABS     . Melatonin 10 MG TBCR Take 10 mg by mouth at bedtime.     . Multiple Vitamin (MULTIVITAMIN) capsule Take 1 capsule by mouth daily.     . Omega-3 Fatty Acids (FISH OIL) 1000 MG CAPS Take 1,000 mg by mouth daily.     . pantoprazole (PROTONIX) 20 MG tablet TAKE ONE TABLET BY MOUTH DAILY  (NEW DOSE) 90 tablet 2  . polyethylene glycol (MIRALAX / GLYCOLAX) 17 g packet Take 17 g by mouth as needed.    . pravastatin (PRAVACHOL) 20 MG tablet Take 1 tablet (20 mg total) by mouth daily. 90 tablet 3  . PRESCRIPTION MEDICATION Take 1 capsule by mouth 2 (two) times daily. New remedy vitamin mixture for neuropathy    . Triamcinolone Acetonide (TRIAMCINOLONE 0.1 % CREAM : EUCERIN) CREA Apply 1 application topically 2 (two) times daily as needed for rash or itching. 500 each 1  . vitamin E 400 UNIT capsule Take 400 Units by mouth daily.      No current facility-administered medications on file prior to visit.   Past Medical History:  Diagnosis Date  . Chicken pox   . Depression   . Diabetes mellitus without complication ([place])   . GERD (gastroesophageal reflux disease)   . Hyperlipidemia   . Hypertension   . Stroke ([place])   . UTI (urinary tract infection)    No Known Allergies  Social History   Socioeconomic History  . Marital status: Widowed    Spouse name: Not on file  . Number of children: Not on file  . Years of education: Not on file  . Highest education level: Not on file  Occupational History  . Occupation: N/A  Tobacco Use  . Smoking status: Never Smoker  . Smokeless tobacco: Never Used  Vaping Use  . Vaping Use: Never used  Substance and Sexual Activity  . Alcohol use: Yes    Comment: occassional wine  . Drug use: No  . Sexual activity: Not Currently  Other  Topics Concern  . Not on file  Social History Narrative   Pt lives in single story home with her daughter   Has 2 children   Some college education   Retired Engineer, production for [employer]   Social Determinants of Health   Financial Resource Strain: [place]   . Difficulty of Paying Living Expenses: Not hard at all  Food Insecurity: No Food Insecurity  . Worried About Charity fundraiser in the Last Year: Never true  . Ran Out of Food in the Last Year: Never true  Transportation Needs: No Transportation Needs  . Lack of Transportation (Medical): No  .  Lack of Transportation (Non-Medical): No  Physical Activity: Insufficiently Active  . Days of Exercise per Week: 2 days  . Minutes of Exercise per Session: 30 min  Stress: No Stress Concern Present  . Feeling of Stress : Not at all  Social Connections: Moderately Integrated  . Frequency of Communication with Friends and Family: More than three times a week  . Frequency of Social Gatherings with Friends and Family: Once a week  . Attends Religious Services: More than 4 times per year  . Active Member of Clubs or Organizations: Yes  . Attends Archivist Meetings: 1 to 4 times per year  . Marital Status: Widowed   Vitals:   07/14/20 0836  BP: 122/74  Pulse: 98  Resp: 16  SpO2: 94%   Body mass index is 25.92 kg/m.  Physical Exam Vitals and nursing note reviewed.  Constitutional:      General: She is not in acute distress.    Appearance: She is well-developed.  HENT:     Head: Normocephalic and atraumatic.     Mouth/Throat:     Mouth: Mucous membranes are moist.  Eyes:     Conjunctiva/sclera: Conjunctivae normal.  Cardiovascular:     Rate and Rhythm: Normal rate and regular rhythm.     Heart sounds: No murmur heard.     Comments: PT present bilateral. Pulmonary:     Effort: Pulmonary effort is normal. No respiratory distress.     Breath sounds: Normal breath sounds.  Abdominal:     Palpations:  Abdomen is soft. There is no mass.     Tenderness: There is no abdominal tenderness.  Musculoskeletal:     Thoracic back: Tenderness present. No bony tenderness.       Back:  Lymphadenopathy:     Cervical: No cervical adenopathy.  Skin:    General: Skin is warm.     Findings: No erythema or rash.  Neurological:     Mental Status: She is alert and oriented to person, place, and time.     Cranial Nerves: No cranial nerve deficit.     Comments: Unstable gait, assisted with a cane.  Psychiatric:     Comments: Well groomed, good eye contact.   ASSESSMENT AND PLAN:  [name] was seen today for back pain and shoulder pain.  Diagnoses and all orders for this visit: Orders Placed This Encounter  Procedures  . DG Chest 2 View  . Basic metabolic panel  . Hemoglobin A1c  . Hepatitis C antibody  . Microalbumin / creatinine urine ratio   Lab Results  Component Value Date   CREATININE 1.03 07/14/2020   BUN 19 07/14/2020   NA 139 07/14/2020   K 4.3 07/14/2020   CL 103 07/14/2020   CO2 27 07/14/2020   Lab Results  Component Value Date   MICROALBUR 1.1 07/14/2020   MICROALBUR 0.8 01/01/2019   Lab Results  Component Value Date   HGBA1C 6.2 07/14/2020   Upper back pain We discussed possible etiologies. Hx and examination suggest musculoskeletal etiology, ? Muscle strain. I do not think back imaging is needed today. It is improving. She prefers to hold on adding muscle relaxants. Topical icy hot will help.  Persistent cough We discussed possible etiologies. No hx of tobacco use.  GERD seems to be well controlled. Benazepril could be a contributing factor, so discontinued. Further recommendations according to CXR result.  DM (diabetes mellitus), type 2 with neurological complications ([place]) LDJ5T has been at goal. Continue non pharmacologic  treatment. Annual eye exam and foot care to continue. F/U in 5-6 months   CKD (chronic kidney disease), stage III (HCC) Problem  has been stable.Cr 0.9-1.0 e GFR in the 50's. Continue adequate hydration and low salt diet. Adequate BP and glucose controlled. Benazepril d/c, Olmesartan added today.  Hyperlipidemia associated with type 2 diabetes mellitus ([place]) Myalgias with statins, last one she tried was pravastatin. Continue low fat diet for now. We will plan on checking FLP next visit.  Systolic hypertension with cerebrovascular disease BP is adequately controlled but because cough, Benazepril discontinued. She agrees with changing to Olmesartan 20 mg daily. No changes in Amlodipine dose. Amlodipine-olmesartan 5-20 mg started today. Continue monitoring BP regularly.  Unstable gait Fall precautions discussed.  Encounter for HCV screening test for low risk patient -     Hepatitis C antibody  Spent 43 minutes.  During this time history was obtained and documented, examination was performed, prior labs reviewed, and assessment/plan discussed.  Return in about 6 months (around 01/13/2021) for Please cancel next appt..   Alexa G. Martinique, MD  [facility]. [place] office.   A few things to remember from today's visit:   Encounter for HCV screening test for low risk patient - Plan: Hepatitis C antibody  DM (diabetes mellitus), type 2 with neurological complications ([place]) - Plan: Basic metabolic panel, Hemoglobin A1c  Stage 3a chronic kidney disease ([place]) - Plan: Basic metabolic panel, Microalbumin / creatinine urine ratio  Hyperlipidemia associated with type 2 diabetes mellitus ([place])  Upper back pain  Persistent cough - Plan: DG Chest 2 View  If you need refills please call your pharmacy. Do not use My Chart to request refills or for acute issues that need immediate attention.   Today blood pressure medication changed, benazepril may be causing the cough. Back pain seems to be muscular. Tylenol 500 mg 3-4 times daily, local icy hot.  Continue low fat diet, we will check cholesterol  next visit.  Please be sure medication list is accurate. If a new problem present, please set up appointment sooner than planned today.

## 2020-07-14 NOTE — Assessment & Plan Note (Addendum)
Problem has been stable.Cr 0.9-1.0 e GFR in the 50's. Continue adequate hydration and low salt diet. Adequate BP and glucose controlled. Benazepril d/c, Olmesartan added today.

## 2020-07-14 NOTE — Assessment & Plan Note (Addendum)
HgA1C has been at goal. Continue non pharmacologic treatment. Annual eye exam and foot care to continue. F/U in 5-6 months

## 2020-07-14 NOTE — Assessment & Plan Note (Addendum)
Myalgias with statins, last one she tried was pravastatin. Continue low fat diet for now. We will plan on checking FLP next visit.

## 2020-07-15 LAB — HEPATITIS C ANTIBODY
Hepatitis C Ab: NONREACTIVE
SIGNAL TO CUT-OFF: 0.01 (ref <1.00)

## 2020-07-17 ENCOUNTER — Ambulatory Visit: Payer: Medicare Other | Admitting: Family Medicine

## 2020-07-20 ENCOUNTER — Encounter: Payer: Self-pay | Admitting: Family Medicine

## 2020-07-22 NOTE — Patient Instructions (Addendum)
Hi Alexa Brown,  It was great to get to speak with you again! Below is a summary of some of the topics we discussed.   Please continue working on your diet to lower your cholesterol and checking your blood pressure!  Please reach out to me if you have any questions or need anything before our follow up!  Best, Maddie  [name], PharmD, [facility] [place]  Visit Information  Goals Addressed   None    Patient Care Plan: CCM Pharmacy Care Plan    Problem Identified: Problem: Hypertension, Hyperlipidemia, Diabetes, GERD, Chronic Kidney Disease, Depression, Osteoarthritis and Gout     Long-Range Goal: Patient-Specific Goal   Start Date: 07/08/2020  Expected End Date: 07/08/2021  This Visit's Progress: On track  Priority: High  Note:   Current Barriers:  . Unable to independently monitor therapeutic efficacy . Unable to achieve control of cholesterol  . Unable to maintain control of blood pressure  Pharmacist Clinical Goal(s):  Marland Kitchen Patient will achieve adherence to monitoring guidelines and medication adherence to achieve therapeutic efficacy . achieve control of cholesterol as evidenced by next lipid panel . maintain control of blood pressure as evidenced by home blood pressure readings  through collaboration with PharmD and provider.   Interventions: . 1:1 collaboration with Martinique, Dasia G, MD regarding development and update of comprehensive plan of care as evidenced by provider attestation and co-signature . Inter-disciplinary care team collaboration (see longitudinal plan of care) . Comprehensive medication review performed; medication list updated in electronic medical record  Hypertension (BP goal <140/90) -Not ideally controlled -Current treatment: . Amlodipine-benazepril 5-20mg  1 capsule daily (8 left) -Medications previously tried: n/a  -Current home readings: 150/76 (since April 6th) -Current dietary habits:  mediterranean diet -Current exercise habits: did not discuss -Denies hypotensive/hypertensive symptoms -Educated on Importance of home blood pressure monitoring; Proper BP monitoring technique; Symptoms of hypotension and importance of maintaining adequate hydration; -Counseled to monitor BP at home a few times weekly, document, and provide log at future appointments -Counseled on diet and exercise extensively Recommended to continue current medication  Hyperlipidemia/CAD: (LDL goal < 70) -Uncontrolled -Current treatment: . Clopidogrel 75 mg daily -Medications previously tried: Atorvastatin, pravastatin (foot cramping, fear of side effects) -Current dietary patterns: restarted mediterranean diet -Current exercise habits: patient reports she needs to restart walking again and chair yoga -Educated on Cholesterol goals;  Importance of limiting foods high in cholesterol; -Counseled on diet and exercise extensively Discussed other options for cholesterol therapy.  Diabetes (A1c goal <7%) -Controlled -Current medications: . No medications -Medications previously tried: Invokana  -Current home glucose readings . fasting glucose: 119, 238 (morning - ice cream for dinner), 133, 139, 145, 134 . post prandial glucose: none -Denies hypoglycemic/hyperglycemic symptoms -Current meal patterns:  . breakfast: did not discuss  . lunch: did not discuss   . dinner: did not discuss  . snacks: did not discuss  . drinks: did not discuss  -Current exercise: patient needs to restart chair yoga -Educated on A1c and blood sugar goals; Exercise goal of 150 minutes per week; Benefits of routine self-monitoring of blood sugar; -Counseled to check feet daily and get yearly eye exams -Counseled on diet and exercise extensively  Depression/Anxiety (Goal: minimize symptoms) -Controlled -Current treatment:  Wellbutrin 75 mg twice daily   Cymbalta 60 mg daily -Medications previously tried/failed:  none -PHQ9: 0 -GAD7: n/a -Educated on Benefits of medication for symptom control -Recommended to continue current medication  Insomnia (Goal: improve quality  and quantity of sleep) -Controlled -Current treatment  . Melatonin 10 mg QHS (does not take Fridays and saturdays) -Medications previously tried: none  -Recommended to continue current medication  Gout (Goal: uric acid < 6 and prevent flare ups) -Controlled -Current treatment  . Allopurinol 100 mg twice daily  -Medications previously tried: none  -Recommended repeat uric acid level  Bone health (Goal prevent fractures) -Controlled -Last DEXA Scan: 11/17/16              T-Score femoral neck: -0.5             T-Score total hip: n/a             T-Score lumbar spine: +0.0             T-Score forearm radius: n/a             10-year probability of major osteoporotic fracture: n/a             10-year probability of hip fracture: n/a -Patient is not a candidate for pharmacologic treatment -Current treatment  . Calcium Carbonate 600 mg QHS + 800 units of D -Medications previously tried: none  -Recommend [phone] units of vitamin D daily. Recommend 1200 mg of calcium daily from dietary and supplemental sources. Recommend weight-bearing and muscle strengthening exercises for building and maintaining bone density. -Recommended to continue current medication  GERD (Goal: minimize symptoms) -Controlled -Current treatment  . Pantoprazole 20 mg daily -Medications previously tried: none  -Recommended to continue current medication  Allergic rhinitis (Goal: minimize symptoms) -Controlled -Current treatment  . Mucinex 600 mg 1 tablet daily -Medications previously tried: none  -Recommended to continue current medication   Health Maintenance -Vaccine gaps: none -Current therapy:   Voltaren 1% gel   L-Lysine 1000 mg daily (Cold sores)   Magnesium Citrate 100 mg   Miralax 17 g daily PRN  Triamcinolone cream  Vitamin E  400 units daily   NeuRemedy (Fenfotiamine 300 mg 1 tablet twice daily)   Equate Womens 50+ multivitamin daily  Vitamin C 250 mg 2 chewables daily -Educated on Cost vs benefit of each product must be carefully weighed by individual consumer -Patient is satisfied with current therapy and denies issues -Recommended to continue current medication  Patient Goals/Self-Care Activities . Patient will:  - take medications as prescribed check glucose daily, document, and provide at future appointments check blood pressure a few times weekly, document, and provide at future appointments target a minimum of 150 minutes of moderate intensity exercise weekly engage in dietary modifications by increasing fiber intake  Follow Up Plan: Telephone follow up appointment with care management team member scheduled for: 3 months       Patient verbalizes understanding of instructions provided today and agrees to view in [place].  Telephone follow up appointment with pharmacy team member scheduled for: 3 months (10/15/20 @ 1pm)  Viona Gilmore, [facility]

## 2020-07-24 ENCOUNTER — Emergency Department (HOSPITAL_COMMUNITY): Payer: Medicare Other

## 2020-07-24 ENCOUNTER — Encounter (HOSPITAL_COMMUNITY): Payer: Self-pay

## 2020-07-24 ENCOUNTER — Inpatient Hospital Stay (HOSPITAL_COMMUNITY)
Admission: EM | Admit: 2020-07-24 | Discharge: 2020-07-27 | DRG: 176 | Disposition: A | Discharge status: Home Health | Payer: Medicare Other | Attending: Student | Admitting: Student

## 2020-07-24 ENCOUNTER — Other Ambulatory Visit: Payer: Self-pay

## 2020-07-24 ENCOUNTER — Ambulatory Visit: Payer: Medicare Other | Admitting: Family Medicine

## 2020-07-24 DIAGNOSIS — Z79899 Other long term (current) drug therapy: Secondary | ICD-10-CM | POA: Diagnosis not present

## 2020-07-24 DIAGNOSIS — Z823 Family history of stroke: Secondary | ICD-10-CM

## 2020-07-24 DIAGNOSIS — E1149 Type 2 diabetes mellitus with other diabetic neurological complication: Secondary | ICD-10-CM

## 2020-07-24 DIAGNOSIS — N179 Acute kidney failure, unspecified: Secondary | ICD-10-CM | POA: Diagnosis present

## 2020-07-24 DIAGNOSIS — Z8249 Family history of ischemic heart disease and other diseases of the circulatory system: Secondary | ICD-10-CM | POA: Diagnosis not present

## 2020-07-24 DIAGNOSIS — R7989 Other specified abnormal findings of blood chemistry: Secondary | ICD-10-CM | POA: Diagnosis present

## 2020-07-24 DIAGNOSIS — Z7902 Long term (current) use of antithrombotics/antiplatelets: Secondary | ICD-10-CM

## 2020-07-24 DIAGNOSIS — R7303 Prediabetes: Secondary | ICD-10-CM | POA: Diagnosis not present

## 2020-07-24 DIAGNOSIS — J9601 Acute respiratory failure with hypoxia: Secondary | ICD-10-CM

## 2020-07-24 DIAGNOSIS — E119 Type 2 diabetes mellitus without complications: Secondary | ICD-10-CM | POA: Diagnosis not present

## 2020-07-24 DIAGNOSIS — R000 Tachycardia, unspecified: Secondary | ICD-10-CM | POA: Diagnosis not present

## 2020-07-24 DIAGNOSIS — R531 Weakness: Secondary | ICD-10-CM | POA: Diagnosis not present

## 2020-07-24 DIAGNOSIS — Z20822 Contact with and (suspected) exposure to covid-19: Secondary | ICD-10-CM | POA: Diagnosis present

## 2020-07-24 DIAGNOSIS — I248 Other forms of acute ischemic heart disease: Secondary | ICD-10-CM | POA: Diagnosis not present

## 2020-07-24 DIAGNOSIS — K219 Gastro-esophageal reflux disease without esophagitis: Secondary | ICD-10-CM | POA: Diagnosis present

## 2020-07-24 DIAGNOSIS — Z820 Family history of epilepsy and other diseases of the nervous system: Secondary | ICD-10-CM

## 2020-07-24 DIAGNOSIS — E785 Hyperlipidemia, unspecified: Secondary | ICD-10-CM | POA: Diagnosis present

## 2020-07-24 DIAGNOSIS — I2692 Saddle embolus of pulmonary artery without acute cor pulmonale: Secondary | ICD-10-CM | POA: Diagnosis not present

## 2020-07-24 DIAGNOSIS — R079 Chest pain, unspecified: Secondary | ICD-10-CM | POA: Diagnosis not present

## 2020-07-24 DIAGNOSIS — I10 Essential (primary) hypertension: Secondary | ICD-10-CM | POA: Diagnosis not present

## 2020-07-24 DIAGNOSIS — Z8659 Personal history of other mental and behavioral disorders: Secondary | ICD-10-CM | POA: Diagnosis not present

## 2020-07-24 DIAGNOSIS — F32A Depression, unspecified: Secondary | ICD-10-CM | POA: Diagnosis present

## 2020-07-24 DIAGNOSIS — Z8673 Personal history of transient ischemic attack (TIA), and cerebral infarction without residual deficits: Secondary | ICD-10-CM

## 2020-07-24 DIAGNOSIS — I2602 Saddle embolus of pulmonary artery with acute cor pulmonale: Secondary | ICD-10-CM | POA: Diagnosis not present

## 2020-07-24 DIAGNOSIS — Z825 Family history of asthma and other chronic lower respiratory diseases: Secondary | ICD-10-CM

## 2020-07-24 DIAGNOSIS — Z83438 Family history of other disorder of lipoprotein metabolism and other lipidemia: Secondary | ICD-10-CM

## 2020-07-24 DIAGNOSIS — Z86711 Personal history of pulmonary embolism: Secondary | ICD-10-CM | POA: Diagnosis present

## 2020-07-24 DIAGNOSIS — I2699 Other pulmonary embolism without acute cor pulmonale: Secondary | ICD-10-CM | POA: Diagnosis present

## 2020-07-24 DIAGNOSIS — N1831 Chronic kidney disease, stage 3a: Secondary | ICD-10-CM | POA: Diagnosis not present

## 2020-07-24 DIAGNOSIS — R778 Other specified abnormalities of plasma proteins: Secondary | ICD-10-CM | POA: Diagnosis not present

## 2020-07-24 DIAGNOSIS — I5032 Chronic diastolic (congestive) heart failure: Secondary | ICD-10-CM | POA: Diagnosis not present

## 2020-07-24 LAB — COMPREHENSIVE METABOLIC PANEL
ALT: 16 U/L (ref 0–44)
AST: 24 U/L (ref 15–41)
Albumin: 4.2 g/dL (ref 3.5–5.0)
Alkaline Phosphatase: 53 U/L (ref 38–126)
Anion gap: 10 (ref 5–15)
BUN: 25 mg/dL — ABNORMAL HIGH (ref 8–23)
CO2: 21 mmol/L — ABNORMAL LOW (ref 22–32)
Calcium: 10.2 mg/dL (ref 8.9–10.3)
Chloride: 113 mmol/L — ABNORMAL HIGH (ref 98–111)
Creatinine, Ser: 1.05 mg/dL — ABNORMAL HIGH (ref 0.44–1.00)
GFR, Estimated: 54 mL/min — ABNORMAL LOW (ref >60)
Glucose, Bld: 132 mg/dL — ABNORMAL HIGH (ref 70–99)
Potassium: 4.4 mmol/L (ref 3.5–5.1)
Sodium: 144 mmol/L (ref 135–145)
Total Bilirubin: 0.5 mg/dL (ref 0.3–1.2)
Total Protein: 7.5 g/dL (ref 6.5–8.1)

## 2020-07-24 LAB — PROTIME-INR
INR: 1 (ref 0.8–1.2)
Prothrombin Time: 13.7 seconds (ref 11.4–15.2)

## 2020-07-24 LAB — CBC WITH DIFFERENTIAL/PLATELET
Abs Immature Granulocytes: 0.02 10*3/uL (ref 0.00–0.07)
Basophils Absolute: 0 10*3/uL (ref 0.0–0.1)
Basophils Relative: 1 %
Eosinophils Absolute: 0.3 10*3/uL (ref 0.0–0.5)
Eosinophils Relative: 4 %
HCT: 44.4 % (ref 36.0–46.0)
Hemoglobin: 14.7 g/dL (ref 12.0–15.0)
Immature Granulocytes: 0 %
Lymphocytes Relative: 21 %
Lymphs Abs: 1.4 10*3/uL (ref 0.7–4.0)
MCH: 30.4 pg (ref 26.0–34.0)
MCHC: 33.1 g/dL (ref 30.0–36.0)
MCV: 91.9 fL (ref 80.0–100.0)
Monocytes Absolute: 0.6 10*3/uL (ref 0.1–1.0)
Monocytes Relative: 8 %
Neutro Abs: 4.7 10*3/uL (ref 1.7–7.7)
Neutrophils Relative %: 66 %
Platelets: 231 10*3/uL (ref 150–400)
RBC: 4.83 MIL/uL (ref 3.87–5.11)
RDW: 12.7 % (ref 11.5–15.5)
WBC: 7 10*3/uL (ref 4.0–10.5)
nRBC: 0 % (ref 0.0–0.2)

## 2020-07-24 LAB — RESP PANEL BY RT-PCR (FLU A&B, COVID) ARPGX2
Influenza A by PCR: NEGATIVE
Influenza B by PCR: NEGATIVE
SARS Coronavirus 2 by RT PCR: NEGATIVE

## 2020-07-24 LAB — TROPONIN I (HIGH SENSITIVITY)
Troponin I (High Sensitivity): 107 ng/L (ref <18)
Troponin I (High Sensitivity): 215 ng/L (ref <18)

## 2020-07-24 LAB — HEPARIN LEVEL (UNFRACTIONATED): Heparin Unfractionated: 0.78 IU/mL — ABNORMAL HIGH (ref 0.30–0.70)

## 2020-07-24 LAB — MRSA PCR SCREENING: MRSA by PCR: NEGATIVE

## 2020-07-24 LAB — D-DIMER, QUANTITATIVE: D-Dimer, Quant: 6.2 ug/mL-FEU — ABNORMAL HIGH (ref 0.00–0.50)

## 2020-07-24 LAB — APTT: aPTT: 23 seconds — ABNORMAL LOW (ref 24–36)

## 2020-07-24 LAB — BRAIN NATRIURETIC PEPTIDE: B Natriuretic Peptide: 21.4 pg/mL (ref 0.0–100.0)

## 2020-07-24 MED ORDER — ADULT MULTIVITAMIN W/MINERALS CH
1.0000 | ORAL_TABLET | Freq: Every day | ORAL | Status: DC
  Administered 2020-07-25 – 2020-07-27 (×3): 1 via ORAL
  Filled 2020-07-24 (×3): qty 1

## 2020-07-24 MED ORDER — IRBESARTAN 150 MG PO TABS
75.0000 mg | ORAL_TABLET | Freq: Every day | ORAL | Status: DC
  Administered 2020-07-25 – 2020-07-27 (×3): 75 mg via ORAL
  Filled 2020-07-24 (×3): qty 1

## 2020-07-24 MED ORDER — VITAMIN E 45 MG (100 UNIT) PO CAPS
400.0000 [IU] | ORAL_CAPSULE | Freq: Every day | ORAL | Status: DC
  Administered 2020-07-25 – 2020-07-27 (×3): 400 [IU] via ORAL
  Filled 2020-07-24 (×3): qty 4

## 2020-07-24 MED ORDER — DULOXETINE HCL 60 MG PO CPEP
60.0000 mg | ORAL_CAPSULE | Freq: Every day | ORAL | Status: DC
  Administered 2020-07-25 – 2020-07-27 (×3): 60 mg via ORAL
  Filled 2020-07-24: qty 2
  Filled 2020-07-24: qty 1
  Filled 2020-07-24: qty 2

## 2020-07-24 MED ORDER — CLOPIDOGREL BISULFATE 75 MG PO TABS
75.0000 mg | ORAL_TABLET | Freq: Every day | ORAL | Status: DC
  Administered 2020-07-25: 75 mg via ORAL
  Filled 2020-07-24: qty 1

## 2020-07-24 MED ORDER — CHLORHEXIDINE GLUCONATE CLOTH 2 % EX PADS
6.0000 | MEDICATED_PAD | Freq: Every day | CUTANEOUS | Status: DC
  Administered 2020-07-24 – 2020-07-25 (×2): 6 via TOPICAL

## 2020-07-24 MED ORDER — ONDANSETRON HCL 4 MG/2ML IJ SOLN: 4.0000 mg | Freq: Four times a day (QID) | INTRAMUSCULAR | Status: DC | PRN

## 2020-07-24 MED ORDER — OMEGA-3-ACID ETHYL ESTERS 1 G PO CAPS
1.0000 g | ORAL_CAPSULE | Freq: Every day | ORAL | Status: DC
  Administered 2020-07-25 – 2020-07-27 (×3): 1 g via ORAL
  Filled 2020-07-24 (×3): qty 1

## 2020-07-24 MED ORDER — B COMPLEX VITAMINS PO CAPS: 1.0000 | ORAL_CAPSULE | Freq: Every day | ORAL | Status: DC

## 2020-07-24 MED ORDER — ASPIRIN 81 MG PO CHEW
324.0000 mg | CHEWABLE_TABLET | Freq: Once | ORAL | Status: AC
  Administered 2020-07-24: 324 mg via ORAL
  Filled 2020-07-24: qty 4

## 2020-07-24 MED ORDER — BUPROPION HCL 75 MG PO TABS
75.0000 mg | ORAL_TABLET | Freq: Two times a day (BID) | ORAL | Status: DC
  Administered 2020-07-24 – 2020-07-27 (×6): 75 mg via ORAL
  Filled 2020-07-24 (×6): qty 1

## 2020-07-24 MED ORDER — HEPARIN BOLUS VIA INFUSION
5000.0000 [IU] | Freq: Once | INTRAVENOUS | Status: AC
  Administered 2020-07-24: 5000 [IU] via INTRAVENOUS
  Filled 2020-07-24: qty 5000

## 2020-07-24 MED ORDER — L-LYSINE 500 MG PO CAPS: 500.0000 mg | ORAL_CAPSULE | Freq: Every day | ORAL | Status: DC

## 2020-07-24 MED ORDER — ALLOPURINOL 100 MG PO TABS
100.0000 mg | ORAL_TABLET | Freq: Two times a day (BID) | ORAL | Status: DC
  Administered 2020-07-24 – 2020-07-27 (×6): 100 mg via ORAL
  Filled 2020-07-24 (×6): qty 1

## 2020-07-24 MED ORDER — ACETAMINOPHEN 325 MG PO TABS: 650.0000 mg | ORAL_TABLET | Freq: Four times a day (QID) | ORAL | Status: DC | PRN

## 2020-07-24 MED ORDER — ONDANSETRON HCL 4 MG PO TABS: 4.0000 mg | ORAL_TABLET | Freq: Four times a day (QID) | ORAL | Status: DC | PRN

## 2020-07-24 MED ORDER — PANTOPRAZOLE SODIUM 20 MG PO TBEC
20.0000 mg | DELAYED_RELEASE_TABLET | ORAL | Status: DC
  Administered 2020-07-25 – 2020-07-27 (×2): 20 mg via ORAL
  Filled 2020-07-24 (×2): qty 1

## 2020-07-24 MED ORDER — AMLODIPINE-OLMESARTAN 5-20 MG PO TABS: 1.0000 | ORAL_TABLET | Freq: Every day | ORAL | Status: DC

## 2020-07-24 MED ORDER — PRAVASTATIN SODIUM 20 MG PO TABS
20.0000 mg | ORAL_TABLET | Freq: Every day | ORAL | Status: DC
  Filled 2020-07-24 (×3): qty 1

## 2020-07-24 MED ORDER — HEPARIN (PORCINE) 25000 UT/250ML-% IV SOLN
1050.0000 [IU]/h | INTRAVENOUS | Status: DC
  Administered 2020-07-24: 1200 [IU]/h via INTRAVENOUS
  Administered 2020-07-25: 1050 [IU]/h via INTRAVENOUS
  Filled 2020-07-24 (×2): qty 250

## 2020-07-24 MED ORDER — IOHEXOL 350 MG/ML SOLN
100.0000 mL | Freq: Once | INTRAVENOUS | Status: AC | PRN
  Administered 2020-07-24: 69 mL via INTRAVENOUS

## 2020-07-24 MED ORDER — AMLODIPINE BESYLATE 5 MG PO TABS
5.0000 mg | ORAL_TABLET | Freq: Every day | ORAL | Status: DC
  Administered 2020-07-25 – 2020-07-27 (×3): 5 mg via ORAL
  Filled 2020-07-24 (×3): qty 1

## 2020-07-24 MED ORDER — COLCHICINE 0.6 MG PO TABS
0.6000 mg | ORAL_TABLET | Freq: Two times a day (BID) | ORAL | Status: DC
  Administered 2020-07-24 – 2020-07-27 (×6): 0.6 mg via ORAL
  Filled 2020-07-24 (×6): qty 1

## 2020-07-24 NOTE — Progress Notes (Signed)
[place] for IV heparin Indication: pulmonary embolus  No Known Allergies  Patient Measurements: Height: 5\' 7"  (170.2 cm) Weight: 68.9 kg (152 lb) IBW/kg (Calculated) : 61.6 Heparin Dosing Weight: TBW  Vital Signs: Temp: 98 F (36.7 C) (04/29 0840) Temp Source: Oral (04/29 0840) BP: 127/73 (04/29 1132) Pulse Rate: 110 (04/29 1132)  Labs: Recent Labs    07/24/20 0854 07/24/20 1054  HGB 14.7  --   HCT 44.4  --   PLT 231  --   APTT 23*  --   LABPROT 13.7  --   INR 1.0  --   CREATININE 1.05*  --   TROPONINIHS 107* 215*    Estimated Creatinine Clearance: 42.9 mL/min (A) (by C-G formula based on SCr of 1.05 mg/dL (H)).   Medical History: Past Medical History:  Diagnosis Date  . Chicken pox   . Depression   . Diabetes mellitus without complication ([place])   . GERD (gastroesophageal reflux disease)   . Hyperlipidemia   . Hypertension   . Stroke ([place])   . UTI (urinary tract infection)     Medications:  (Not in a hospital admission)  Scheduled:  . heparin  5,000 Units Intravenous Once    Assessment: 47 yoF with PMH HTN, DM, CVA 2017, presenting with SOB and radiating chest pain several hours ago. CTA shows bulky saddle PE   Baseline INR, aPTT: not done  Prior anticoagulation: none PTA, only on Plavix 75 mg/d; LD 4/29 AM  Significant events:  Today, 07/24/2020:  CBC: WNL  SCr appears to be at baseline  No bleeding or infusion issues per nursing  Goal of Therapy: Heparin level 0.3-0.7 units/ml Monitor platelets by anticoagulation protocol: Yes  Plan:  Heparin 5000 units IV bolus x 1  Heparin 1200 units/hr IV infusion  Check heparin level 8 hrs after start  Daily CBC, daily heparin level once stable  Monitor for signs of bleeding or thrombosis  Reuel Boom, PharmD, BCPS [phone] 07/24/2020, 12:18 PM

## 2020-07-24 NOTE — Progress Notes (Signed)
PHARMACIST - PHYSICIAN ORDER COMMUNICATION  CONCERNING: P&T Medication Policy on Herbal Medications  DESCRIPTION:  This patient's order for:  L-Lysine  has been noted.  This product(s) is classified as an "herbal" or natural product. Due to a lack of definitive safety studies or FDA approval, nonstandard manufacturing practices, plus the potential risk of unknown drug-drug interactions while on inpatient medications, the Pharmacy and Therapeutics Committee does not permit the use of "herbal" or natural products of this type within [facility].   ACTION TAKEN: The pharmacy department is unable to verify this order at this time and your patient has been informed of this safety policy. Please reevaluate patient's clinical condition at discharge and address if the herbal or natural product(s) should be resumed at that time.  Peggyann Juba, PharmD, BCPS Pharmacy: [phone] 07/24/2020. 1:56 PM

## 2020-07-24 NOTE — ED Notes (Signed)
Patient 89% on 2lpm [place] after ambulating to restroom. Increased to 3lpm [place]

## 2020-07-24 NOTE — ED Provider Notes (Signed)
[facility] DEPT Provider Note   CSN: [id number] Arrival date & time: 07/24/20  0820     History Chief Complaint  Patient presents with  . Chest Pain    Alexa Brown is a 80 y.o. female.  Presented to ER with concern for chest pain.  Patient reports that she has had some intermittent shortness of breath over the past couple weeks.  Today noted new chest pain.  Pain was initially severe, central, described as pressure.  Now it is only mild.  Occurred after using aspirin this morning around 5 AM.  Has shortness of breath with exertion, none at present.  No fevers, cough.  Denies history of DVT/PE.  HPI     Past Medical History:  Diagnosis Date  . Chicken pox   . Depression   . Diabetes mellitus without complication ([place])   . GERD (gastroesophageal reflux disease)   . Hyperlipidemia   . Hypertension   . Stroke ([place])   . UTI (urinary tract infection)     Patient Active Problem List   Diagnosis Date Noted  . Acute pulmonary embolism ([place]) 07/24/2020  . Myalgia due to statin 02/11/2020  . Elbow dislocation 07/02/2019  . Generalized osteoarthritis of multiple sites 06/26/2018  . Gout, arthropathy 01/22/2018  . Lower back pain 09/22/2017  . Unstable gait 09/22/2017  . Seborrheic dermatitis 01/23/2017  . Seborrheic keratosis 09/27/2016  . CKD (chronic kidney disease), stage III ([place]) 05/28/2016  . GERD (gastroesophageal reflux disease) 02/25/2016  . CVA (cerebral vascular accident) (HCC)-No residual deficit 02/12/2016  . Glaucoma 02/12/2016  . Depression, major, recurrent ([place]) 02/12/2016  . Hyperlipidemia associated with type 2 diabetes mellitus ([place]) 02/12/2016  . OSA (obstructive sleep apnea) 02/12/2016  . Vitamin D deficiency 01/14/2016  . DM (diabetes mellitus), type 2 with neurological complications ([place]) 35/57/3220  . Systolic hypertension with cerebrovascular disease 01/14/2016  . Peripheral neuropathy 01/14/2016    Past  Surgical History:  Procedure Laterality Date  . ABDOMINAL HYSTERECTOMY    . APPENDECTOMY    . BREAST BIOPSY Left 2014  . BREAST SURGERY     biopsy     OB History   No obstetric history on file.     Family History  Problem Relation Age of Onset  . Hyperlipidemia Mother   . Heart disease Mother   . Stroke Mother   . Hypertension Mother   . Hyperlipidemia Father   . Heart disease Father   . Stroke Father   . Hypertension Father   . Asthma Father   . Parkinson's disease Sister     Social History   Tobacco Use  . Smoking status: Never Smoker  . Smokeless tobacco: Never Used  Vaping Use  . Vaping Use: Never used  Substance Use Topics  . Alcohol use: Yes    Comment: occassional wine  . Drug use: No    Home Medications Prior to Admission medications   Medication Sig Start Date End Date Taking? Authorizing Provider  acetaminophen (TYLENOL) 325 MG tablet Take 650 mg by mouth every 6 (six) hours as needed for mild pain, fever or headache.   Yes [provider]  allopurinol (ZYLOPRIM) 100 MG tablet Take 1 tablet (100 mg total) by mouth 2 (two) times daily. 11/12/19  Yes [place], Ercel G, MD  Alpha-Lipoic Acid 600 MG TABS Take 600 mg by mouth daily.   Yes [provider]  amLODipine-olmesartan (AZOR) 5-20 MG tablet Take 1 tablet by mouth daily. 07/14/20  Yes  [place], Kensli G, MD  b complex vitamins capsule Take 1 capsule by mouth daily.   Yes [provider]  buPROPion (WELLBUTRIN) 75 MG tablet TAKE ONE TABLET BY MOUTH TWICE A DAY Patient taking differently: Take 75 mg by mouth 2 (two) times daily. 06/16/20  Yes [place], Lorra G, MD  clopidogrel (PLAVIX) 75 MG tablet TAKE ONE TABLET BY MOUTH DAILY Patient taking differently: Take 75 mg by mouth daily. 03/04/20  Yes [place], Zayden G, MD  colchicine 0.6 MG tablet TAKE ONE TABLET BY MOUTH TWICE A DAY Patient taking differently: Take 0.6 mg by mouth 2 (two) times daily. 04/08/19  Yes [place], Iya G, MD   DULoxetine (CYMBALTA) 60 MG capsule TAKE ONE CAPSULE BY MOUTH DAILY Patient taking differently: Take 60 mg by mouth daily. 02/10/20  Yes [place], Makenlee G, MD  Homeopathic Products [facility](THERAWORX RELIEF EX) Apply 1 application topically daily as needed (foot cramps).   Yes [provider]  L-Lysine 500 MG CAPS Take 500 mg by mouth at bedtime.    Yes [provider]  Multiple Vitamin (MULTIVITAMIN) capsule Take 1 capsule by mouth daily.    Yes [provider]  Omega-3 Fatty Acids (FISH OIL) 1000 MG CAPS Take 1,000 mg by mouth 2 (two) times daily.   Yes [provider]  pantoprazole (PROTONIX) 20 MG tablet TAKE ONE TABLET BY MOUTH DAILY  (NEW DOSE) Patient taking differently: Take 20 mg by mouth every other day. 11/01/19  Yes [place], Carnesha G, MD  pravastatin (PRAVACHOL) 20 MG tablet Take 1 tablet (20 mg total) by mouth daily. 02/12/20  Yes [place], Hortencia G, MD  vitamin E 400 UNIT capsule Take 400 Units by mouth daily.    Yes [provider]  ACCU-CHEK AVIVA PLUS test strip USE TO TEST BLOOD SUGAR ONCE DAILY 09/09/19   [place], Cynithia G, MD  Accu-Chek Softclix Lancets lancets Use to check blood sugar once daily 09/10/19   [place], Heylee G, MD  Lancet Devices [facility](ACCU-CHEK SOFTCLIX) lancets Use to test blood sugar once a day. 03/31/16   [place], Kym G, MD    Allergies    Patient has no known allergies.  Review of Systems   Review of Systems  Constitutional: Negative for chills and fever.  HENT: Negative for ear pain and sore throat.   Eyes: Negative for pain and visual disturbance.  Respiratory: Positive for chest tightness. Negative for cough and shortness of breath.   Cardiovascular: Positive for chest pain. Negative for palpitations.  Gastrointestinal: Negative for abdominal pain and vomiting.  Genitourinary: Negative for dysuria and hematuria.  Musculoskeletal: Negative for arthralgias and back pain.  Skin: Negative for color change and rash.  Neurological:  Negative for seizures and syncope.  All other systems reviewed and are negative.   Physical Exam Updated Vital Signs BP 126/70 (BP Location: Right Arm)   Pulse (!) 108   Temp 98.5 F (36.9 C)   Resp (!) 21   Ht 5\' 7"  (1.702 m)   Wt 71 kg   SpO2 92%   BMI 24.52 kg/m   Physical Exam Vitals and nursing note reviewed.  Constitutional:      General: She is not in acute distress.    Appearance: She is well-developed.  HENT:     Head: Normocephalic and atraumatic.  Eyes:     Conjunctiva/sclera: Conjunctivae normal.  Cardiovascular:     Rate and Rhythm: Normal rate and regular rhythm.     Heart sounds: No murmur heard.  Pulmonary:     Effort: Pulmonary effort is normal. No respiratory distress.     Breath sounds: Normal breath sounds.  Abdominal:     Palpations: Abdomen is soft.     Tenderness: There is no abdominal tenderness.  Musculoskeletal:     Cervical back: Neck supple.  Skin:    General: Skin is warm and dry.  Neurological:     Mental Status: She is alert.     ED Results / Procedures / Treatments   Labs (all labs ordered are listed, but only abnormal results are displayed) Labs Reviewed  COMPREHENSIVE METABOLIC PANEL - Abnormal; Notable for the following components:      Result Value   Chloride 113 (*)    CO2 21 (*)    Glucose, Bld 132 (*)    BUN 25 (*)    Creatinine, Ser 1.05 (*)    GFR, Estimated 54 (*)    All other components within normal limits  D-DIMER, QUANTITATIVE - Abnormal; Notable for the following components:   D-Dimer, Quant 6.20 (*)    All other components within normal limits  APTT - Abnormal; Notable for the following components:   aPTT 23 (*)    All other components within normal limits  TROPONIN I (HIGH SENSITIVITY) - Abnormal; Notable for the following components:   Troponin I (High Sensitivity) 107 (*)    All other components within normal limits  TROPONIN I (HIGH SENSITIVITY) - Abnormal; Notable for the following components:    Troponin I (High Sensitivity) 215 (*)    All other components within normal limits  RESP PANEL BY RT-PCR (FLU A&B, COVID) ARPGX2  MRSA PCR SCREENING  CBC WITH DIFFERENTIAL/PLATELET  BRAIN NATRIURETIC PEPTIDE  PROTIME-INR  HEPARIN LEVEL (UNFRACTIONATED)    EKG EKG Interpretation  Date/Time:  Friday July 24 2020 08:31:48 EDT Ventricular Rate:  109 PR Interval:  178 QRS Duration: 86 QT Interval:  340 QTC Calculation: 457 R Axis:   92 Text Interpretation: Sinus tachycardia Rightward axis Nonspecific ST and T wave abnormality Abnormal ECG Confirmed by Madalyn Rob [phone]) on 07/24/2020 8:52:56 AM   Radiology DG Chest 2 View  Result Date: 07/24/2020 CLINICAL DATA:  Chest pain EXAM: CHEST - 2 VIEW COMPARISON:  07/14/2020 FINDINGS: Heart size and vascularity normal.  Negative for edema or effusion. Vague density overlying the right anterior second rib similar to the prior study. This could be a rib lesion or underlying infiltrate or lung nodule. This is not appreciated on the prior study from 2009. IMPRESSION: Ill-defined density overlying the right anterior second rib unchanged. Possible rib lesion. Possible infiltrate or lung nodule. Recommend CT chest with contrast for further evaluation. Electronically Signed   By: Franchot Gallo M.D.   On: 07/24/2020 09:40   CT Angio Chest PE W and/or Wo Contrast  Addendum Date: 07/24/2020   ADDENDUM REPORT: 07/24/2020 11:27 ADDENDUM: Critical Value/emergent results were called by telephone at the time of interpretation on 07/24/2020 at 11:26 am to Dr. Madalyn Rob , who verbally acknowledged these results. Electronically Signed   By: Genevie Ann M.D.   On: 07/24/2020 11:27   Result Date: 07/24/2020 CLINICAL DATA:  80 year old female with progressive, persistent shortness of breath. EXAM: CT ANGIOGRAPHY CHEST WITH CONTRAST TECHNIQUE: Multidetector CT imaging of the chest was performed using the standard protocol during bolus administration of  intravenous contrast. Multiplanar CT image reconstructions and MIPs were obtained to evaluate the vascular anatomy. CONTRAST:  31mL OMNIPAQUE IOHEXOL 350 MG/ML SOLN COMPARISON:  Chest radiographs  0927 hours today. FINDINGS: Cardiovascular: Excellent contrast bolus timing in the pulmonary arterial tree. Positive bilateral pulmonary emboli with saddle embolus on series 5, image 110. Lobar and segmental emboli bilaterally. Superimposed respiratory motion artifact. Abnormal RV/LV ratio > 1.  No pericardial effusion. Calcified aortic atherosclerosis. Mediastinum/Nodes: Numerous calcified mediastinal lymph nodes compatible with prior granulomatous process. No suspicious mediastinal lymphadenopathy. No axillary lymphadenopathy. Lungs/Pleura: Major airways are patent. Low lung volumes with atelectasis. Calcified left lower lobe medial basal segment granuloma. Small posterior right upper lobe pulmonary infarct suspected abutting the major fissure on series 10, image 43. No other confluent pulmonary opacity. No pleural effusion. Upper Abdomen: Negative visible liver, gallbladder, adrenal glands and bowel. Numerous calcified splenic granulomas. Musculoskeletal: Chronic appearing T11 superior endplate compression. No acute osseous abnormality identified. No suspicious osseous lesion identified. Review of the MIP images confirms the above findings. IMPRESSION: 1. Positive for acute PE with saddle embolus, bulky clot bilaterally, and CT evidence of right heart strain (RV/LV Ratio >1) consistent with at least submassive (intermediate risk) PE. The presence of right heart strain has been associated with an increased risk of morbidity and mortality. Please refer to the "PE Focused" order set in EPIC. 2. Small peripheral right upper lobe pulmonary infarct which corresponds to the earlier chest x-ray density. No pleural effusion. 3. Incidental sequelae of prior granulomatous disease in the chest and abdomen. 4. Aortic Atherosclerosis  (ICD10-I70.0). Electronically Signed: By: Genevie Ann M.D. On: 07/24/2020 11:20    Procedures .Critical Care Performed by: Lucrezia Starch, MD Authorized by: Lucrezia Starch, MD   Critical care provider statement:    Critical care time (minutes):  45   Critical care was necessary to treat or prevent imminent or life-threatening deterioration of the following conditions:  Respiratory failure   Critical care was time spent personally by me on the following activities:  Discussions with consultants, evaluation of patient's response to treatment, examination of patient, ordering and performing treatments and interventions, ordering and review of laboratory studies, ordering and review of radiographic studies, pulse oximetry, re-evaluation of patient's condition, obtaining history from patient or surrogate and review of old charts     Medications Ordered in ED Medications  heparin ADULT infusion 100 units/mL (25000 units/23mL) (1,200 Units/hr Intravenous New Bag/Given 07/24/20 1230)  acetaminophen (TYLENOL) tablet 650 mg (has no administration in time range)  allopurinol (ZYLOPRIM) tablet 100 mg (has no administration in time range)  colchicine tablet 0.6 mg (has no administration in time range)  pravastatin (PRAVACHOL) tablet 20 mg (has no administration in time range)  buPROPion (WELLBUTRIN) tablet 75 mg (has no administration in time range)  DULoxetine (CYMBALTA) DR capsule 60 mg (has no administration in time range)  pantoprazole (PROTONIX) EC tablet 20 mg (has no administration in time range)  clopidogrel (PLAVIX) tablet 75 mg (has no administration in time range)  vitamin E capsule 400 Units (has no administration in time range)  omega-3 acid ethyl esters (LOVAZA) capsule 1 g (has no administration in time range)  multivitamin with minerals tablet 1 tablet (has no administration in time range)  ondansetron (ZOFRAN) tablet 4 mg (has no administration in time range)    Or  ondansetron  (ZOFRAN) injection 4 mg (has no administration in time range)  irbesartan (AVAPRO) tablet 75 mg (has no administration in time range)    And  amLODipine (NORVASC) tablet 5 mg (has no administration in time range)  Chlorhexidine Gluconate Cloth 2 % PADS 6 each (6 each Topical Given 07/24/20 1400)  aspirin chewable tablet 324 mg (324 mg Oral Given 07/24/20 0944)  iohexol (OMNIPAQUE) 350 MG/ML injection 100 mL (69 mLs Intravenous Contrast Given 07/24/20 1055)  heparin bolus via infusion 5,000 Units (5,000 Units Intravenous Bolus from Bag 07/24/20 1229)    ED Course  I have reviewed the triage vital signs and the nursing notes.  Pertinent labs & imaging results that were available during my care of the patient were reviewed by me and considered in my medical decision making (see chart for details).  Clinical Course as of 07/24/20 1625  Fri Jul 24, 2020  1120 CT Angio Chest PE W and/or Wo Contrast Saddle PE - will reach out to radiology to discuss then consult pulm; placed order for heparin [RD]  1159 Pulm at bedside [RD]    Clinical Course User Index [RD] Lucrezia Starch, MD   MDM Rules/Calculators/A&P                         80 year old lady with history of chest pain, shortness of breath found to be mildly hypoxic, mildly tachycardic with stable BP.  On exam she looks well.  No definitive ischemic change on EKG.  Dimer significantly elevated.  CT PE study concerning for very large acute pulmonary embolism with some right heart strain.  Discussed case with pulmonary who came to evaluate patient as consult.  They recommended hospitalist admission, heparin. Dr. Marylyn Ishihara will admit.  Final Clinical Impression(s) / ED Diagnoses Final diagnoses:  Acute saddle pulmonary embolism, unspecified whether acute cor pulmonale present ([place])  Acute respiratory failure with hypoxia [facility])    Rx / DC Orders ED Discharge Orders    None       Lucrezia Starch, MD 07/24/20 [id number]

## 2020-07-24 NOTE — ED Notes (Signed)
88% RA. 2lpm [place] applied and MD aware

## 2020-07-24 NOTE — H&P (Signed)
History and Physical    Alexa Brown [id number] DOB: February 02, 1941 DOA: 07/24/2020  PCP: Martinique, Chloris G, MD  Patient coming from: Home  Chief Complaint: chest pressure  HPI: Alexa Brown is a 80 y.o. female with medical history significant of CKD3a, DM2, HTN, HLD. Presenting with chest pain and dyspnea. She reports waking up this morning and walking to the bathroom. As she was getting moving, she started feeling this central chest pressure and pain. She got back to her room and was dyspneic. Her family checked her O2 sat and it was 88% on RA. Her chest pressure/pain continued for about 30 minutes and ran down her right arm. She didn't try any medicines to help. She does report that she has been short of breath for 3 or 4 days prior to this morning, and she's been weak just as long. Family became concerned and brought her to the ED. She denies any other aggravating or alleviating factors.    ED Course: EKG shows sinus tachy w/o st elevations. Trp were elevated. D-dimer was elevated. CTA of chest showed saddle embolism. PCCM was consulted. TRH was called for admission.   Review of Systems:  Denies palpitations, lightheadedness, dizziness, N/V/D, fevers, sick contacts. Review of systems is otherwise negative for all not mentioned in HPI.   PMHx Past Medical History:  Diagnosis Date  . Chicken pox   . Depression   . Diabetes mellitus without complication ([place])   . GERD (gastroesophageal reflux disease)   . Hyperlipidemia   . Hypertension   . Stroke ([place])   . UTI (urinary tract infection)     PSHx Past Surgical History:  Procedure Laterality Date  . ABDOMINAL HYSTERECTOMY    . APPENDECTOMY    . BREAST BIOPSY Left 2014  . BREAST SURGERY     biopsy    SocHx  reports that she has never smoked. She has never used smokeless tobacco. She reports current alcohol use. She reports that she does not use drugs.  No Known Allergies  FamHx Family History  Problem  Relation Age of Onset  . Hyperlipidemia Mother   . Heart disease Mother   . Stroke Mother   . Hypertension Mother   . Hyperlipidemia Father   . Heart disease Father   . Stroke Father   . Hypertension Father   . Asthma Father   . Parkinson's disease Sister     Prior to Admission medications   Medication Sig Start Date End Date Taking? Authorizing Provider  acetaminophen (TYLENOL) 325 MG tablet Take 650 mg by mouth every 6 (six) hours as needed for mild pain, fever or headache.   Yes [provider]  allopurinol (ZYLOPRIM) 100 MG tablet Take 1 tablet (100 mg total) by mouth 2 (two) times daily. 11/12/19  Yes Martinique, Sadia G, MD  Alpha-Lipoic Acid 600 MG TABS Take 600 mg by mouth daily.   Yes [provider]  amLODipine-olmesartan (AZOR) 5-20 MG tablet Take 1 tablet by mouth daily. 07/14/20  Yes Martinique, Kersten G, MD  b complex vitamins capsule Take 1 capsule by mouth daily.   Yes [provider]  buPROPion (WELLBUTRIN) 75 MG tablet TAKE ONE TABLET BY MOUTH TWICE A DAY Patient taking differently: Take 75 mg by mouth 2 (two) times daily. 06/16/20  Yes Martinique, Kaitlyne G, MD  clopidogrel (PLAVIX) 75 MG tablet TAKE ONE TABLET BY MOUTH DAILY Patient taking differently: Take 75 mg by mouth daily. 03/04/20  Yes Martinique, Doneta G, MD  colchicine  0.6 MG tablet TAKE ONE TABLET BY MOUTH TWICE A DAY Patient taking differently: Take 0.6 mg by mouth 2 (two) times daily. 04/08/19  Yes Martinique, Faige G, MD  DULoxetine (CYMBALTA) 60 MG capsule TAKE ONE CAPSULE BY MOUTH DAILY Patient taking differently: Take 60 mg by mouth daily. 02/10/20  Yes Martinique, Emersen G, MD  Homeopathic Products ([place] EX) Apply 1 application topically daily as needed (foot cramps).   Yes [provider]  L-Lysine 500 MG CAPS Take 500 mg by mouth at bedtime.    Yes [provider]  Multiple Vitamin (MULTIVITAMIN) capsule Take 1 capsule by mouth daily.    Yes [provider]   Omega-3 Fatty Acids (FISH OIL) 1000 MG CAPS Take 1,000 mg by mouth 2 (two) times daily.   Yes [provider]  pantoprazole (PROTONIX) 20 MG tablet TAKE ONE TABLET BY MOUTH DAILY  (NEW DOSE) Patient taking differently: Take 20 mg by mouth every other day. 11/01/19  Yes Martinique, Faithlyn G, MD  pravastatin (PRAVACHOL) 20 MG tablet Take 1 tablet (20 mg total) by mouth daily. 02/12/20  Yes Martinique, Daveah G, MD  vitamin E 400 UNIT capsule Take 400 Units by mouth daily.    Yes [provider]  ACCU-CHEK AVIVA PLUS test strip USE TO TEST BLOOD SUGAR ONCE DAILY 09/09/19   Martinique, Tracy G, MD  Accu-Chek Softclix Lancets lancets Use to check blood sugar once daily 09/10/19   Martinique, Bernisha G, MD  Lancet Devices [facility]) lancets Use to test blood sugar once a day. 03/31/16   Martinique, Judeth G, MD    Physical Exam: Vitals:   07/24/20 1042 07/24/20 1109 07/24/20 1132 07/24/20 1230  BP:   127/73 117/74  Pulse: (!) 114 (!) 110 (!) 110 (!) 109  Resp: (!) 26  20 20   Temp:      TempSrc:      SpO2: (!) 89% 95% 91% 93%  Weight:      Height:        General: 80 y.o. female resting in bed in NAD Eyes: PERRL, normal sclera ENMT: Nares patent w/o discharge, orophaynx clear, dentition normal, ears w/o discharge/lesions/ulcers Neck: Supple, trachea midline Cardiovascular: tachy, +S1, S2, no m/g/r, equal pulses throughout Respiratory: CTABL, no w/r/r, slightly increased WOB on 2-3L [place] GI: BS+, NDNT, no masses noted, no organomegaly noted MSK: No e/c/c Skin: No rashes, bruises, ulcerations noted Neuro: A&O x 3, no focal deficits Psyc: Appropriate interaction and affect, calm/cooperative  Labs on Admission: I have personally reviewed following labs and imaging studies  CBC: Recent Labs  Lab 07/24/20 0854  WBC 7.0  NEUTROABS 4.7  HGB 14.7  HCT 44.4  MCV 91.9  PLT 542   Basic Metabolic Panel: Recent Labs  Lab 07/24/20 0854  NA 144  K 4.4  CL 113*  CO2 21*  GLUCOSE 132*   BUN 25*  CREATININE 1.05*  CALCIUM 10.2   GFR: Estimated Creatinine Clearance: 42.9 mL/min (A) (by C-G formula based on SCr of 1.05 mg/dL (H)). Liver Function Tests: Recent Labs  Lab 07/24/20 0854  AST 24  ALT 16  ALKPHOS 53  BILITOT 0.5  PROT 7.5  ALBUMIN 4.2   No results for input(s): LIPASE, AMYLASE in the last 168 hours. No results for input(s): AMMONIA in the last 168 hours. Coagulation Profile: Recent Labs  Lab 07/24/20 0854  INR 1.0   Cardiac Enzymes: No results for input(s): CKTOTAL, CKMB, CKMBINDEX, TROPONINI in the last 168 hours. BNP (  last 3 results) No results for input(s): PROBNP in the last 8760 hours. HbA1C: No results for input(s): HGBA1C in the last 72 hours. CBG: No results for input(s): GLUCAP in the last 168 hours. Lipid Profile: No results for input(s): CHOL, HDL, LDLCALC, TRIG, CHOLHDL, LDLDIRECT in the last 72 hours. Thyroid Function Tests: No results for input(s): TSH, T4TOTAL, FREET4, T3FREE, THYROIDAB in the last 72 hours. Anemia Panel: No results for input(s): VITAMINB12, FOLATE, FERRITIN, TIBC, IRON, RETICCTPCT in the last 72 hours. Urine analysis: No results found for: COLORURINE, APPEARANCEUR, [place], [place], GLUCOSEU, [place], BILIRUBINUR, KETONESUR, PROTEINUR, UROBILINOGEN, NITRITE, LEUKOCYTESUR  Radiological Exams on Admission: DG Chest 2 View  Result Date: 07/24/2020 CLINICAL DATA:  Chest pain EXAM: CHEST - 2 VIEW COMPARISON:  07/14/2020 FINDINGS: Heart size and vascularity normal.  Negative for edema or effusion. Vague density overlying the right anterior second rib similar to the prior study. This could be a rib lesion or underlying infiltrate or lung nodule. This is not appreciated on the prior study from 2009. IMPRESSION: Ill-defined density overlying the right anterior second rib unchanged. Possible rib lesion. Possible infiltrate or lung nodule. Recommend CT chest with contrast for further evaluation. Electronically Signed   By:  Franchot Gallo M.D.   On: 07/24/2020 09:40   CT Angio Chest PE W and/or Wo Contrast  Addendum Date: 07/24/2020   ADDENDUM REPORT: 07/24/2020 11:27 ADDENDUM: Critical Value/emergent results were called by telephone at the time of interpretation on 07/24/2020 at 11:26 am to Dr. Madalyn Rob , who verbally acknowledged these results. Electronically Signed   By: Genevie Ann M.D.   On: 07/24/2020 11:27   Result Date: 07/24/2020 CLINICAL DATA:  80 year old female with progressive, persistent shortness of breath. EXAM: CT ANGIOGRAPHY CHEST WITH CONTRAST TECHNIQUE: Multidetector CT imaging of the chest was performed using the standard protocol during bolus administration of intravenous contrast. Multiplanar CT image reconstructions and MIPs were obtained to evaluate the vascular anatomy. CONTRAST:  72mL OMNIPAQUE IOHEXOL 350 MG/ML SOLN COMPARISON:  Chest radiographs 0927 hours today. FINDINGS: Cardiovascular: Excellent contrast bolus timing in the pulmonary arterial tree. Positive bilateral pulmonary emboli with saddle embolus on series 5, image 110. Lobar and segmental emboli bilaterally. Superimposed respiratory motion artifact. Abnormal RV/LV ratio > 1.  No pericardial effusion. Calcified aortic atherosclerosis. Mediastinum/Nodes: Numerous calcified mediastinal lymph nodes compatible with prior granulomatous process. No suspicious mediastinal lymphadenopathy. No axillary lymphadenopathy. Lungs/Pleura: Major airways are patent. Low lung volumes with atelectasis. Calcified left lower lobe medial basal segment granuloma. Small posterior right upper lobe pulmonary infarct suspected abutting the major fissure on series 10, image 43. No other confluent pulmonary opacity. No pleural effusion. Upper Abdomen: Negative visible liver, gallbladder, adrenal glands and bowel. Numerous calcified splenic granulomas. Musculoskeletal: Chronic appearing T11 superior endplate compression. No acute osseous abnormality identified. No  suspicious osseous lesion identified. Review of the MIP images confirms the above findings. IMPRESSION: 1. Positive for acute PE with saddle embolus, bulky clot bilaterally, and CT evidence of right heart strain (RV/LV Ratio >1) consistent with at least submassive (intermediate risk) PE. The presence of right heart strain has been associated with an increased risk of morbidity and mortality. Please refer to the "PE Focused" order set in EPIC. 2. Small peripheral right upper lobe pulmonary infarct which corresponds to the earlier chest x-ray density. No pleural effusion. 3. Incidental sequelae of prior granulomatous disease in the chest and abdomen. 4. Aortic Atherosclerosis (ICD10-I70.0). Electronically Signed: By: Genevie Ann M.D. On: 07/24/2020 11:20  EKG: Independently reviewed. Sinus tach, no st elevation  Assessment/Plan Acute saddle PE w/ right heart strain     - admit to inpt, SDU     - she has been reviewed by PCCM, no need for lytics, mechanical aspiration right now     - heparin gtt for 48 - 72 hours     - BNP is 24     - O2 as needed (currently on 3L [place] during interview)  Elevated trp     - likely as a fxn of above     - EKG w/o st elevations     - starting heparin gtt for above      - cycle additional set of trp  CKD3a     - at baseline SCr; watch nephrotoxins, follow  HLD     - diet controlled; continue outpt follow up  DM2     - diet controlled; check AM glucose, DM diet, follow     - A1c from 10 days ago is 6/2%  HTN      - resume home regimen  DVT prophylaxis: heparin gtt  Code Status: DNI  Family Communication: w/ daughter at bedside  Consults called: Pulmnology   Status is: Inpatient  Remains inpatient appropriate because:Inpatient level of care appropriate due to severity of illness   Dispo: The patient is from: Home              Anticipated d/c is to: Home              Patient currently is not medically stable to d/c.   Difficult to place patient  No  Jonnie Finner DO [facility]  If 7PM-7AM, please contact night-coverage [email or web address]  07/24/2020, 12:46 PM

## 2020-07-24 NOTE — ED Triage Notes (Signed)
Pt arrived via POV, c/o intermittent sternal chest pain/pressure, radiating to right after that started after using restroom this morning. Denies any other sx.

## 2020-07-24 NOTE — ED Notes (Signed)
Called ICU to verify nurse for report. They are working on assigning bed at this time

## 2020-07-24 NOTE — Consult Note (Signed)
Name: Alexa Brown MRN: [id number] DOB: [birth year]    ADMISSION DATE:  07/24/2020 CONSULTATION DATE:  07/24/2020   REFERRING MD :  Roslynn Amble, EDP   CHIEF COMPLAINT: Chest pain  BRIEF PATIENT DESCRIPTION: 80 year old never smoker admitted with unprovoked submassive saddle PE  SIGNIFICANT EVENTS    STUDIES:  4/29 CT angiogram chest >> saddle PE, high clot burden, RV strain   HISTORY OF PRESENT ILLNESS: 80 year old woman admitted with sudden onset substernal chest pain radiating to her right arm which developed when she woke up and walked to the bathroom.  She reports shortness of breath ongoing for about 2 weeks.  Reviewed PCP visit on 4/19.  She reports generalized weakness ongoing for a couple of weeks but has been mobile around the house.  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  ED evaluation showed sinus tachycardia, elevated D-dimer. CT angiogram chest confirmed submassive, saddle PE with high clot burden and lobar and segmental branches, also showed an area of peripheral right upper lobe infarct Her chest pain was 8/10 intensity on arrival and is now decreased to 3/10.  Oxygen saturation was 88% on room air and is now 95% on 2 L nasal cannula She denies recent travel, previous history of VTE or similar history in family members Her daughter is present who corroborates history   PAST MEDICAL HISTORY :   has a past medical history of Chicken pox, Depression, Diabetes mellitus without complication ([place]), GERD (gastroesophageal reflux disease), Hyperlipidemia, Hypertension, Stroke ([place]), and UTI (urinary tract infection).  has a past surgical history that includes Abdominal hysterectomy; Appendectomy; Breast surgery; and Breast biopsy (Left, 2014). Prior to Admission medications   Medication Sig Start Date End Date Taking? Authorizing Provider  acetaminophen (TYLENOL) 325 MG tablet Take 650 mg by mouth every 6 (six) hours as needed  for mild pain, fever or headache.   Yes [provider]  allopurinol (ZYLOPRIM) 100 MG tablet Take 1 tablet (100 mg total) by mouth 2 (two) times daily. 11/12/19  Yes Martinique, Soliyana G, MD  Alpha-Lipoic Acid 600 MG TABS Take 600 mg by mouth daily.   Yes [provider]  amLODipine-olmesartan (AZOR) 5-20 MG tablet Take 1 tablet by mouth daily. 07/14/20  Yes Martinique, Nola G, MD  b complex vitamins capsule Take 1 capsule by mouth daily.   Yes [provider]  buPROPion (WELLBUTRIN) 75 MG tablet TAKE ONE TABLET BY MOUTH TWICE A DAY Patient taking differently: Take 75 mg by mouth 2 (two) times daily. 06/16/20  Yes Martinique, Jayona G, MD  clopidogrel (PLAVIX) 75 MG tablet TAKE ONE TABLET BY MOUTH DAILY Patient taking differently: Take 75 mg by mouth daily. 03/04/20  Yes Martinique, Charmon G, MD  colchicine 0.6 MG tablet TAKE ONE TABLET BY MOUTH TWICE A DAY Patient taking differently: Take 0.6 mg by mouth 2 (two) times daily. 04/08/19  Yes Martinique, Nadiya G, MD  DULoxetine (CYMBALTA) 60 MG capsule TAKE ONE CAPSULE BY MOUTH DAILY Patient taking differently: Take 60 mg by mouth daily. 02/10/20  Yes Martinique, Johanny G, MD  Homeopathic Products ([place] EX) Apply 1 application topically daily as needed (foot cramps).   Yes [provider]  L-Lysine 500 MG CAPS Take 500 mg by mouth at bedtime.    Yes [provider]  Multiple Vitamin (MULTIVITAMIN) capsule Take 1 capsule by mouth daily.    Yes [provider]  Omega-3 Fatty Acids (FISH OIL) 1000 MG CAPS Take  1,000 mg by mouth 2 (two) times daily.   Yes [provider]  pantoprazole (PROTONIX) 20 MG tablet TAKE ONE TABLET BY MOUTH DAILY  (NEW DOSE) Patient taking differently: Take 20 mg by mouth every other day. 11/01/19  Yes [place]ique, Shayona G, MD  pravastatin (PRAVACHOL) 20 MG tablet Take 1 tablet (20 mg total) by mouth daily. 02/12/20  Yes [place]ique, Jaquelynn G, MD  vitamin E 400 UNIT capsule Take 400 Units by mouth  daily.    Yes [provider]  ACCU-CHEK AVIVA PLUS test strip USE TO TEST BLOOD SUGAR ONCE DAILY 09/09/19   [place], Reda G, MD  Accu-Chek Softclix Lancets lancets Use to check blood sugar once daily 09/10/19   Martin[place], Graciemae G, MD  Lancet Devices [facility]) lancets Use to test blood sugar once a day. 03/31/16   [place], Marcel G, MD   No Known Allergies  FAMILY HISTORY:  family history includes Asthma in her father; Heart disease in her father and mother; Hyperlipidemia in her father and mother; Hypertension in her father and mother; Parkinson's disease in her sister; Stroke in her father and mother. SOCIAL HISTORY:  reports that she has never smoked. She has never used smokeless tobacco. She reports current alcohol use. She reports that she does not use drugs.  REVIEW OF SYSTEMS:   Constitutional: Negative for fever, chills, weight loss, malaise/fatigue and diaphoresis.  HENT: Negative for hearing loss, ear pain, nosebleeds, congestion, sore throat, neck pain, tinnitus and ear discharge.   Eyes: Negative for blurred vision, double vision, photophobia, pain, discharge and redness.  Respiratory: Negative for cough, hemoptysis, sputum production, wheezing and stridor.   Cardiovascular: Negative for  palpitations, orthopnea, claudication, leg swelling and PND.  Gastrointestinal: Negative for heartburn, nausea, vomiting, abdominal pain, diarrhea, constipation, blood in stool and melena.  Genitourinary: Negative for dysuria, urgency, frequency, hematuria and flank pain.  Musculoskeletal: Negative for myalgias, back pain, joint pain and falls.  Skin: Negative for itching and rash.  Neurological: Negative for dizziness, tingling, tremors, sensory change, speech change, focal weakness, seizures, loss of consciousness, weakness and headaches.  Endo/Heme/Allergies: Negative for environmental allergies and polydipsia. Does not bruise/bleed easily.  SUBJECTIVE:   VITAL SIGNS: Temp:  [98  F (36.7 C)] 98 F (36.7 C) (04/29 0840) Pulse Rate:  [103-114] 109 (04/29 1230) Resp:  [13-26] 20 (04/29 1230) BP: (117-135)/(71-74) 117/74 (04/29 1230) SpO2:  [88 %-95 %] 93 % (04/29 1230) Weight:  [68.9 kg] 68.9 kg (04/29 0840)  PHYSICAL EXAMINATION: Gen. Pleasant, well-nourished, elderly woman, in no distress, normal affect ENT - no pallor,icterus, no post nasal drip Neck: No JVD, no thyromegaly, no carotid bruits Lungs: no use of accessory muscles, no dullness to percussion, clear without rales or rhonchi  Cardiovascular: Rhythm regular, heart sounds  normal, no murmurs or gallops, no peripheral edema Abdomen: soft and non-tender, no hepatosplenomegaly, BS normal. Musculoskeletal: No deformities, no cyanosis or clubbing Neuro:  alert, non focal   Recent Labs  Lab 07/24/20 0854  NA 144  K 4.4  CL 113*  CO2 21*  BUN 25*  CREATININE 1.05*  GLUCOSE 132*   Recent Labs  Lab 07/24/20 0854  HGB 14.7  HCT 44.4  WBC 7.0  PLT 231   DG Chest 2 View  Result Date: 07/24/2020 CLINICAL DATA:  Chest pain EXAM: CHEST - 2 VIEW COMPARISON:  07/14/2020 FINDINGS: Heart size and vascularity normal.  Negative for edema or effusion. Vague density overlying the right anterior second rib similar to the  prior study. This could be a rib lesion or underlying infiltrate or lung nodule. This is not appreciated on the prior study from 2009. IMPRESSION: Ill-defined density overlying the right anterior second rib unchanged. Possible rib lesion. Possible infiltrate or lung nodule. Recommend CT chest with contrast for further evaluation. Electronically Signed   By: Franchot Gallo M.D.   On: 07/24/2020 09:40   CT Angio Chest PE W and/or Wo Contrast  Addendum Date: 07/24/2020   ADDENDUM REPORT: 07/24/2020 11:27 ADDENDUM: Critical Value/emergent results were called by telephone at the time of interpretation on 07/24/2020 at 11:26 am to Dr. Madalyn Rob , who verbally acknowledged these results.  Electronically Signed   By: Genevie Ann M.D.   On: 07/24/2020 11:27   Result Date: 07/24/2020 CLINICAL DATA:  80 year old female with progressive, persistent shortness of breath. EXAM: CT ANGIOGRAPHY CHEST WITH CONTRAST TECHNIQUE: Multidetector CT imaging of the chest was performed using the standard protocol during bolus administration of intravenous contrast. Multiplanar CT image reconstructions and MIPs were obtained to evaluate the vascular anatomy. CONTRAST:  71mL OMNIPAQUE IOHEXOL 350 MG/ML SOLN COMPARISON:  Chest radiographs 0927 hours today. FINDINGS: Cardiovascular: Excellent contrast bolus timing in the pulmonary arterial tree. Positive bilateral pulmonary emboli with saddle embolus on series 5, image 110. Lobar and segmental emboli bilaterally. Superimposed respiratory motion artifact. Abnormal RV/LV ratio > 1.  No pericardial effusion. Calcified aortic atherosclerosis. Mediastinum/Nodes: Numerous calcified mediastinal lymph nodes compatible with prior granulomatous process. No suspicious mediastinal lymphadenopathy. No axillary lymphadenopathy. Lungs/Pleura: Major airways are patent. Low lung volumes with atelectasis. Calcified left lower lobe medial basal segment granuloma. Small posterior right upper lobe pulmonary infarct suspected abutting the major fissure on series 10, image 43. No other confluent pulmonary opacity. No pleural effusion. Upper Abdomen: Negative visible liver, gallbladder, adrenal glands and bowel. Numerous calcified splenic granulomas. Musculoskeletal: Chronic appearing T11 superior endplate compression. No acute osseous abnormality identified. No suspicious osseous lesion identified. Review of the MIP images confirms the above findings. IMPRESSION: 1. Positive for acute PE with saddle embolus, bulky clot bilaterally, and CT evidence of right heart strain (RV/LV Ratio >1) consistent with at least submassive (intermediate risk) PE. The presence of right heart strain has been associated  with an increased risk of morbidity and mortality. Please refer to the "PE Focused" order set in EPIC. 2. Small peripheral right upper lobe pulmonary infarct which corresponds to the earlier chest x-ray density. No pleural effusion. 3. Incidental sequelae of prior granulomatous disease in the chest and abdomen. 4. Aortic Atherosclerosis (ICD10-I70.0). Electronically Signed: By: Genevie Ann M.D. On: 07/24/2020 11:20    ASSESSMENT / PLAN:  Acute, unprovoked saddle PE with RV strain Pulmonary infarct   Recommend : -Start IV heparin and monitor for therapeutic levels , would suggest stay on IV heparin for at least 48 to 72 hours given high clot burden before switching to oral anticoagulation -Does not meet criteria for lytics -Have made IR aware but do not feel that she needs intervention in the form of catheter lytics or clot aspiration.  This can be reserved for salvage therapy in the event of deterioration -Admit to stepdown unit, PCCM will follow  Kara Mead MD. FCCP. [facility] Pager : [phone]  If no response to pager , please call [phone] until 7 pm After 7:00 pm call Elink  [phone]     07/24/2020, 12:53 PM

## 2020-07-24 NOTE — Progress Notes (Signed)
[place] for IV heparin Indication: pulmonary embolus  No Known Allergies  Patient Measurements: Height: 5\' 7"  (170.2 cm) Weight: 71 kg (156 lb 8.4 oz) IBW/kg (Calculated) : 61.6 Heparin Dosing Weight: TBW  Vital Signs: Temp: 98.2 F (36.8 C) (04/29 2015) Temp Source: Oral (04/29 2015) BP: 126/70 (04/29 1400) Pulse Rate: 108 (04/29 1400)  Labs: Recent Labs    07/24/20 0854 07/24/20 1054 07/24/20 2056  HGB 14.7  --   --   HCT 44.4  --   --   PLT 231  --   --   APTT 23*  --   --   LABPROT 13.7  --   --   INR 1.0  --   --   HEPARINUNFRC  --   --  0.78*  CREATININE 1.05*  --   --   TROPONINIHS 107* 215*  --     Estimated Creatinine Clearance: 42.9 mL/min (A) (by C-G formula based on SCr of 1.05 mg/dL (H)).   Medical History: Past Medical History:  Diagnosis Date  . Chicken pox   . Depression   . Diabetes mellitus without complication ([place])   . GERD (gastroesophageal reflux disease)   . Hyperlipidemia   . Hypertension   . Stroke ([place])   . UTI (urinary tract infection)     Medications:  Medications Prior to Admission  Medication Sig Dispense Refill Last Dose  . acetaminophen (TYLENOL) 325 MG tablet Take 650 mg by mouth every 6 (six) hours as needed for mild pain, fever or headache.   Past Week at Unknown time  . allopurinol (ZYLOPRIM) 100 MG tablet Take 1 tablet (100 mg total) by mouth 2 (two) times daily. 180 tablet 3 07/24/2020 at Unknown time  . Alpha-Lipoic Acid 600 MG TABS Take 600 mg by mouth daily.   07/23/2020 at Unknown time  . amLODipine-olmesartan (AZOR) 5-20 MG tablet Take 1 tablet by mouth daily. 90 tablet 0 07/24/2020 at Unknown time  . b complex vitamins capsule Take 1 capsule by mouth daily.   07/23/2020 at Unknown time  . buPROPion (WELLBUTRIN) 75 MG tablet TAKE ONE TABLET BY MOUTH TWICE A DAY (Patient taking differently: Take 75 mg by mouth 2 (two) times daily.) 180 tablet 1 07/24/2020 at Unknown time  .  clopidogrel (PLAVIX) 75 MG tablet TAKE ONE TABLET BY MOUTH DAILY (Patient taking differently: Take 75 mg by mouth daily.) 90 tablet 3 07/24/2020 at 0730  . colchicine 0.6 MG tablet TAKE ONE TABLET BY MOUTH TWICE A DAY (Patient taking differently: Take 0.6 mg by mouth 2 (two) times daily.) 30 tablet 0 07/24/2020 at Unknown time  . DULoxetine (CYMBALTA) 60 MG capsule TAKE ONE CAPSULE BY MOUTH DAILY (Patient taking differently: Take 60 mg by mouth daily.) 90 capsule 2 07/24/2020 at Unknown time  . Homeopathic Products [facility] RELIEF EX) Apply 1 application topically daily as needed (foot cramps).   07/23/2020 at Unknown time  . L-Lysine 500 MG CAPS Take 500 mg by mouth at bedtime.    07/23/2020 at Unknown time  . Multiple Vitamin (MULTIVITAMIN) capsule Take 1 capsule by mouth daily.    07/24/2020 at Unknown time  . Omega-3 Fatty Acids (FISH OIL) 1000 MG CAPS Take 1,000 mg by mouth 2 (two) times daily.   07/24/2020 at Unknown time  . pantoprazole (PROTONIX) 20 MG tablet TAKE ONE TABLET BY MOUTH DAILY  (NEW DOSE) (Patient taking differently: Take 20 mg by mouth every other day.) 90 tablet 2 07/23/2020  at Unknown time  . pravastatin (PRAVACHOL) 20 MG tablet Take 1 tablet (20 mg total) by mouth daily. 90 tablet 3 07/24/2020 at Unknown time  . vitamin E 400 UNIT capsule Take 400 Units by mouth daily.    07/23/2020 at Unknown time  . ACCU-CHEK AVIVA PLUS test strip USE TO TEST BLOOD SUGAR ONCE DAILY 100 strip 1   . Accu-Chek Softclix Lancets lancets Use to check blood sugar once daily 100 each 2   . Lancet Devices (ACCU-CHEK SOFTCLIX) lancets Use to test blood sugar once a day. 100 each 2    Scheduled:  . allopurinol  100 mg Oral BID  . [START ON 07/25/2020] irbesartan  75 mg Oral Daily   And  . [START ON 07/25/2020] amLODipine  5 mg Oral Daily  . buPROPion  75 mg Oral BID  . Chlorhexidine Gluconate Cloth  6 each Topical Daily  . [START ON 07/25/2020] clopidogrel  75 mg Oral Daily  . colchicine  0.6 mg Oral BID   . [START ON 07/25/2020] DULoxetine  60 mg Oral Daily  . [START ON 07/25/2020] multivitamin with minerals  1 tablet Oral Daily  . [START ON 07/25/2020] omega-3 acid ethyl esters  1 g Oral Daily  . [START ON 07/25/2020] pantoprazole  20 mg Oral QODAY  . [START ON 07/25/2020] pravastatin  20 mg Oral Daily  . [START ON 07/25/2020] vitamin E  400 Units Oral Daily    Assessment: 56 yoF with PMH HTN, DM, CVA 2017, presenting with SOB and radiating chest pain several hours ago. CTA shows bulky saddle PE   Prior anticoagulation: none PTA, only on Plavix 75 mg/d; LD 4/29 AM  Baseline protime/INR, aptt WNL  Significant events:  Today, 07/24/2020:  Heparin level 0.78- slightly above goal range on heparin 1200 units/hr  CBC: WNL  SCr appears to be at baseline  No bleeding or infusion issues per nursing  Goal of Therapy: Heparin level 0.3-0.7 units/ml Monitor platelets by anticoagulation protocol: Yes  Plan:  Decrease Heparin 1050 units/hr IV infusion  Check heparin level 8 hrs after rate change  Daily CBC, daily heparin level once stable  Monitor for signs of bleeding or thrombosis  Netta Cedars, PharmD, BCPS [phone] 07/24/2020, 10:07 PM

## 2020-07-25 ENCOUNTER — Inpatient Hospital Stay (HOSPITAL_COMMUNITY): Payer: Medicare Other

## 2020-07-25 DIAGNOSIS — Z8673 Personal history of transient ischemic attack (TIA), and cerebral infarction without residual deficits: Secondary | ICD-10-CM

## 2020-07-25 DIAGNOSIS — R7303 Prediabetes: Secondary | ICD-10-CM

## 2020-07-25 DIAGNOSIS — I10 Essential (primary) hypertension: Secondary | ICD-10-CM

## 2020-07-25 DIAGNOSIS — R778 Other specified abnormalities of plasma proteins: Secondary | ICD-10-CM

## 2020-07-25 DIAGNOSIS — R7989 Other specified abnormal findings of blood chemistry: Secondary | ICD-10-CM

## 2020-07-25 DIAGNOSIS — I2602 Saddle embolus of pulmonary artery with acute cor pulmonale: Secondary | ICD-10-CM

## 2020-07-25 DIAGNOSIS — N1831 Chronic kidney disease, stage 3a: Secondary | ICD-10-CM

## 2020-07-25 DIAGNOSIS — Z8659 Personal history of other mental and behavioral disorders: Secondary | ICD-10-CM

## 2020-07-25 LAB — COMPREHENSIVE METABOLIC PANEL
ALT: 14 U/L (ref 0–44)
AST: 18 U/L (ref 15–41)
Albumin: 3.4 g/dL — ABNORMAL LOW (ref 3.5–5.0)
Alkaline Phosphatase: 45 U/L (ref 38–126)
Anion gap: 5 (ref 5–15)
BUN: 28 mg/dL — ABNORMAL HIGH (ref 8–23)
CO2: 24 mmol/L (ref 22–32)
Calcium: 9.3 mg/dL (ref 8.9–10.3)
Chloride: 111 mmol/L (ref 98–111)
Creatinine, Ser: 1.22 mg/dL — ABNORMAL HIGH (ref 0.44–1.00)
GFR, Estimated: 45 mL/min — ABNORMAL LOW (ref >60)
Glucose, Bld: 122 mg/dL — ABNORMAL HIGH (ref 70–99)
Potassium: 4 mmol/L (ref 3.5–5.1)
Sodium: 140 mmol/L (ref 135–145)
Total Bilirubin: 0.2 mg/dL — ABNORMAL LOW (ref 0.3–1.2)
Total Protein: 6.4 g/dL — ABNORMAL LOW (ref 6.5–8.1)

## 2020-07-25 LAB — TROPONIN I (HIGH SENSITIVITY)
Troponin I (High Sensitivity): 128 ng/L (ref <18)
Troponin I (High Sensitivity): 134 ng/L (ref <18)

## 2020-07-25 LAB — ECHOCARDIOGRAM COMPLETE
Area-P 1/2: 2.73 cm2
Height: 67 in
S' Lateral: 1.9 cm
Weight: 2504.43 oz

## 2020-07-25 LAB — CBC
HCT: 42.2 % (ref 36.0–46.0)
Hemoglobin: 13.9 g/dL (ref 12.0–15.0)
MCH: 30.7 pg (ref 26.0–34.0)
MCHC: 32.9 g/dL (ref 30.0–36.0)
MCV: 93.2 fL (ref 80.0–100.0)
Platelets: 237 10*3/uL (ref 150–400)
RBC: 4.53 MIL/uL (ref 3.87–5.11)
RDW: 12.8 % (ref 11.5–15.5)
WBC: 7.6 10*3/uL (ref 4.0–10.5)
nRBC: 0 % (ref 0.0–0.2)

## 2020-07-25 LAB — HEPARIN LEVEL (UNFRACTIONATED)
Heparin Unfractionated: 0.74 IU/mL — ABNORMAL HIGH (ref 0.30–0.70)
Heparin Unfractionated: 0.55 IU/mL (ref 0.30–0.70)

## 2020-07-25 LAB — GLUCOSE, CAPILLARY: Glucose-Capillary: 111 mg/dL — ABNORMAL HIGH (ref 70–99)

## 2020-07-25 MED ORDER — HEPARIN (PORCINE) 25000 UT/250ML-% IV SOLN
950.0000 [IU]/h | INTRAVENOUS | Status: AC
  Administered 2020-07-26 – 2020-07-27 (×2): 950 [IU]/h via INTRAVENOUS
  Filled 2020-07-25 (×3): qty 250

## 2020-07-25 NOTE — Progress Notes (Signed)
  Echocardiogram 2D Echocardiogram has been performed.  Alexa Brown 07/25/2020, 9:58 AM

## 2020-07-25 NOTE — Progress Notes (Signed)
Lower extremity venous has been completed.   Preliminary results in CV Proc.   Abram Sander 07/25/2020 8:56 AM

## 2020-07-25 NOTE — Progress Notes (Signed)
Name: Alexa Brown MRN: [id number] DOB: 08/29/1940    ADMISSION DATE:  07/24/2020 CONSULTATION DATE:  07/25/2020   REFERRING MD :  Roslynn Amble, EDP   CHIEF COMPLAINT: Chest pain  BRIEF PATIENT DESCRIPTION: 80 year old never smoker admitted with unprovoked submassive saddle PE  SIGNIFICANT EVENTS    STUDIES:  4/29 CT angiogram chest >> saddle PE, high clot burden, RV strain 4/30 venous duplex BLE negative   HISTORY OF PRESENT ILLNESS: 80 year old woman admitted with sudden onset substernal chest pain radiating to her right arm which developed when she woke up and walked to the bathroom.  She reports shortness of breath ongoing for about 2 weeks.  Reviewed PCP visit on 4/19.  She reports generalized weakness ongoing for a couple of weeks but has been mobile around the house.  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  ED evaluation showed sinus tachycardia, elevated D-dimer. CT angiogram chest confirmed submassive, saddle PE with high clot burden and lobar and segmental branches, also showed an area of peripheral right upper lobe infarct Her chest pain was 8/10 intensity on arrival and is now decreased to 3/10.  Oxygen saturation was 88% on room air and is now 95% on 2 L nasal cannula She denies recent travel, previous history of VTE or similar history in family members Her daughter is present who corroborates history  SUBJECTIVE:  Chest pressure has decreased to 5/10. No dyspnea. Remains on 3 L nasal cannula  VITAL SIGNS: Temp:  [97.9 F (36.6 C)-98.5 F (36.9 C)] 97.9 F (36.6 C) (04/30 0910) Pulse Rate:  [76-110] 78 (04/30 1100) Resp:  [15-22] 17 (04/30 1100) BP: (112-137)/(28-74) 124/56 (04/30 1100) SpO2:  [91 %-95 %] 93 % (04/30 1100) Weight:  [71 kg] 71 kg (04/29 1300)  PHYSICAL EXAMINATION: Gen. Pleasant, well-nourished, elderly woman, in no distress, normal affect ENT - no pallor,icterus, no post nasal drip Neck: No  JVD, no thyromegaly, no carotid bruits Lungs: No accessory muscle use, clear breath sounds bilateral Cardiovascular: S1-S2 regular, no murmur Abdomen: soft and non-tender, no hepatosplenomegaly, BS normal. Musculoskeletal: No deformities, no cyanosis or clubbing Neuro:  alert, non focal   Recent Labs  Lab 07/24/20 0854 07/25/20 0246  NA 144 140  K 4.4 4.0  CL 113* 111  CO2 21* 24  BUN 25* 28*  CREATININE 1.05* 1.22*  GLUCOSE 132* 122*   Recent Labs  Lab 07/24/20 0854 07/25/20 0246  HGB 14.7 13.9  HCT 44.4 42.2  WBC 7.0 7.6  PLT 231 237   DG Chest 2 View  Result Date: 07/24/2020 CLINICAL DATA:  Chest pain EXAM: CHEST - 2 VIEW COMPARISON:  07/14/2020 FINDINGS: Heart size and vascularity normal.  Negative for edema or effusion. Vague density overlying the right anterior second rib similar to the prior study. This could be a rib lesion or underlying infiltrate or lung nodule. This is not appreciated on the prior study from 2009. IMPRESSION: Ill-defined density overlying the right anterior second rib unchanged. Possible rib lesion. Possible infiltrate or lung nodule. Recommend CT chest with contrast for further evaluation. Electronically Signed   By: Franchot Gallo M.D.   On: 07/24/2020 09:40   CT Angio Chest PE W and/or Wo Contrast  Addendum Date: 07/24/2020   ADDENDUM REPORT: 07/24/2020 11:27 ADDENDUM: Critical Value/emergent results were called by telephone at the time of interpretation on 07/24/2020 at 11:26 am to Dr. Madalyn Rob , who verbally acknowledged these results. Electronically Signed   By:  Genevie Ann M.D.   On: 07/24/2020 11:27   Result Date: 07/24/2020 CLINICAL DATA:  80 year old female with progressive, persistent shortness of breath. EXAM: CT ANGIOGRAPHY CHEST WITH CONTRAST TECHNIQUE: Multidetector CT imaging of the chest was performed using the standard protocol during bolus administration of intravenous contrast. Multiplanar CT image reconstructions and MIPs were  obtained to evaluate the vascular anatomy. CONTRAST:  77mL OMNIPAQUE IOHEXOL 350 MG/ML SOLN COMPARISON:  Chest radiographs 0927 hours today. FINDINGS: Cardiovascular: Excellent contrast bolus timing in the pulmonary arterial tree. Positive bilateral pulmonary emboli with saddle embolus on series 5, image 110. Lobar and segmental emboli bilaterally. Superimposed respiratory motion artifact. Abnormal RV/LV ratio > 1.  No pericardial effusion. Calcified aortic atherosclerosis. Mediastinum/Nodes: Numerous calcified mediastinal lymph nodes compatible with prior granulomatous process. No suspicious mediastinal lymphadenopathy. No axillary lymphadenopathy. Lungs/Pleura: Major airways are patent. Low lung volumes with atelectasis. Calcified left lower lobe medial basal segment granuloma. Small posterior right upper lobe pulmonary infarct suspected abutting the major fissure on series 10, image 43. No other confluent pulmonary opacity. No pleural effusion. Upper Abdomen: Negative visible liver, gallbladder, adrenal glands and bowel. Numerous calcified splenic granulomas. Musculoskeletal: Chronic appearing T11 superior endplate compression. No acute osseous abnormality identified. No suspicious osseous lesion identified. Review of the MIP images confirms the above findings. IMPRESSION: 1. Positive for acute PE with saddle embolus, bulky clot bilaterally, and CT evidence of right heart strain (RV/LV Ratio >1) consistent with at least submassive (intermediate risk) PE. The presence of right heart strain has been associated with an increased risk of morbidity and mortality. Please refer to the "PE Focused" order set in EPIC. 2. Small peripheral right upper lobe pulmonary infarct which corresponds to the earlier chest x-ray density. No pleural effusion. 3. Incidental sequelae of prior granulomatous disease in the chest and abdomen. 4. Aortic Atherosclerosis (ICD10-I70.0). Electronically Signed: By: Genevie Ann M.D. On: 07/24/2020  11:20   VAS [place] LOWER EXTREMITY VENOUS (DVT)  Result Date: 07/25/2020  Lower Venous DVT Study Patient Name:  Alexa Brown  Date of Exam:   07/25/2020 Medical Rec #: [id number]               Accession #:    [id number] Date of Birth: 01-08-1941               Patient Gender: F Patient Age:   80Y Exam Location:  [facility] Procedure:      VAS [place] LOWER EXTREMITY VENOUS (DVT) Referring Phys: [id number] Charlesetta Ivory GONFA --------------------------------------------------------------------------------  Indications: Elevated ddimer.  Comparison Study: no prior Performing Technologist: Abram Sander RVS  Examination Guidelines: A complete evaluation includes B-mode imaging, spectral Doppler, color Doppler, and power Doppler as needed of all accessible portions of each vessel. Bilateral testing is considered an integral part of a complete examination. Limited examinations for reoccurring indications may be performed as noted. The reflux portion of the exam is performed with the patient in reverse Trendelenburg.  +---------+---------------+---------+-----------+----------+--------------+ RIGHT    CompressibilityPhasicitySpontaneityPropertiesThrombus Aging +---------+---------------+---------+-----------+----------+--------------+ CFV      Full           Yes      Yes                                 +---------+---------------+---------+-----------+----------+--------------+ SFJ      Full                                                        +---------+---------------+---------+-----------+----------+--------------+  FV Prox  Full                                                        +---------+---------------+---------+-----------+----------+--------------+ FV Mid   Full                                                        +---------+---------------+---------+-----------+----------+--------------+ FV DistalFull                                                         +---------+---------------+---------+-----------+----------+--------------+ PFV      Full                                                        +---------+---------------+---------+-----------+----------+--------------+ POP      Full           Yes      Yes                                 +---------+---------------+---------+-----------+----------+--------------+ PTV      Full                                                        +---------+---------------+---------+-----------+----------+--------------+ PERO     Full                                                        +---------+---------------+---------+-----------+----------+--------------+   +---------+---------------+---------+-----------+----------+--------------+ LEFT     CompressibilityPhasicitySpontaneityPropertiesThrombus Aging +---------+---------------+---------+-----------+----------+--------------+ CFV      Full           Yes      Yes                                 +---------+---------------+---------+-----------+----------+--------------+ SFJ      Full                                                        +---------+---------------+---------+-----------+----------+--------------+ FV Prox  Full                                                        +---------+---------------+---------+-----------+----------+--------------+  FV Mid   Full                                                        +---------+---------------+---------+-----------+----------+--------------+ FV DistalFull                                                        +---------+---------------+---------+-----------+----------+--------------+ PFV      Full                                                        +---------+---------------+---------+-----------+----------+--------------+ POP      Full           Yes      Yes                                  +---------+---------------+---------+-----------+----------+--------------+ PTV      Full                                                        +---------+---------------+---------+-----------+----------+--------------+ PERO     Full                                                        +---------+---------------+---------+-----------+----------+--------------+     Summary: BILATERAL: - No evidence of deep vein thrombosis seen in the lower extremities, bilaterally. - No evidence of superficial venous thrombosis in the lower extremities, bilaterally. -No evidence of popliteal cyst, bilaterally.   *See table(s) above for measurements and observations.    Preliminary     ASSESSMENT / PLAN:  Acute, unprovoked saddle PE with RV strain Pulmonary infarct   Recommend : -Continue IV heparin for at least 48 to 72 hours given high clot burden before switching to oral anticoagulation -Does not meet criteria for lytics -Can mobilize to side of bed today , can transfer to telemetry  Pulmonary follow-up as outpatient, duration of anticoagulation unclear, possibly lifelong given unprovoked nature of life-threatening PE  Kara Mead MD. FCCP. [facility] Pager : [phone]  If no response to pager , please call [phone] until 7 pm After 7:00 pm call [place]ink  [phone]     07/25/2020, 11:05 AM

## 2020-07-25 NOTE — Progress Notes (Signed)
[place] for IV heparin Indication: pulmonary embolus  No Known Allergies  Patient Measurements: Height: 5\' 7"  (170.2 cm) Weight: 71 kg (156 lb 8.4 oz) IBW/kg (Calculated) : 61.6 Heparin Dosing Weight: 71 kg  Vital Signs: Temp: 98 F (36.7 C) (04/30 0341) Temp Source: Oral (04/30 0341) BP: 119/56 (04/30 0500) Pulse Rate: 79 (04/30 0500)  Labs: Recent Labs    07/24/20 0854 07/24/20 1054 07/24/20 2056 07/25/20 0246 07/25/20 0624  HGB 14.7  --   --  13.9  --   HCT 44.4  --   --  42.2  --   PLT 231  --   --  237  --   APTT 23*  --   --   --   --   LABPROT 13.7  --   --   --   --   INR 1.0  --   --   --   --   HEPARINUNFRC  --   --  0.78*  --  0.74*  CREATININE 1.05*  --   --  1.22*  --   TROPONINIHS 107* 215*  --   --   --     Estimated Creatinine Clearance: 37 mL/min (A) (by C-G formula based on SCr of 1.22 mg/dL (H)).   Medical History: Past Medical History:  Diagnosis Date  . Chicken pox   . Depression   . Diabetes mellitus without complication ([place])   . GERD (gastroesophageal reflux disease)   . Hyperlipidemia   . Hypertension   . Stroke ([place])   . UTI (urinary tract infection)     Medications:  Medications Prior to Admission  Medication Sig Dispense Refill Last Dose  . acetaminophen (TYLENOL) 325 MG tablet Take 650 mg by mouth every 6 (six) hours as needed for mild pain, fever or headache.   Past Week at Unknown time  . allopurinol (ZYLOPRIM) 100 MG tablet Take 1 tablet (100 mg total) by mouth 2 (two) times daily. 180 tablet 3 07/24/2020 at Unknown time  . Alpha-Lipoic Acid 600 MG TABS Take 600 mg by mouth daily.   07/23/2020 at Unknown time  . amLODipine-olmesartan (AZOR) 5-20 MG tablet Take 1 tablet by mouth daily. 90 tablet 0 07/24/2020 at Unknown time  . b complex vitamins capsule Take 1 capsule by mouth daily.   07/23/2020 at Unknown time  . buPROPion (WELLBUTRIN) 75 MG tablet TAKE ONE TABLET BY MOUTH TWICE A DAY  (Patient taking differently: Take 75 mg by mouth 2 (two) times daily.) 180 tablet 1 07/24/2020 at Unknown time  . clopidogrel (PLAVIX) 75 MG tablet TAKE ONE TABLET BY MOUTH DAILY (Patient taking differently: Take 75 mg by mouth daily.) 90 tablet 3 07/24/2020 at 0730  . colchicine 0.6 MG tablet TAKE ONE TABLET BY MOUTH TWICE A DAY (Patient taking differently: Take 0.6 mg by mouth 2 (two) times daily.) 30 tablet 0 07/24/2020 at Unknown time  . DULoxetine (CYMBALTA) 60 MG capsule TAKE ONE CAPSULE BY MOUTH DAILY (Patient taking differently: Take 60 mg by mouth daily.) 90 capsule 2 07/24/2020 at Unknown time  . Homeopathic Products [facility] RELIEF EX) Apply 1 application topically daily as needed (foot cramps).   07/23/2020 at Unknown time  . L-Lysine 500 MG CAPS Take 500 mg by mouth at bedtime.    07/23/2020 at Unknown time  . Multiple Vitamin (MULTIVITAMIN) capsule Take 1 capsule by mouth daily.    07/24/2020 at Unknown time  . Omega-3 Fatty Acids (FISH OIL)  1000 MG CAPS Take 1,000 mg by mouth 2 (two) times daily.   07/24/2020 at Unknown time  . pantoprazole (PROTONIX) 20 MG tablet TAKE ONE TABLET BY MOUTH DAILY  (NEW DOSE) (Patient taking differently: Take 20 mg by mouth every other day.) 90 tablet 2 07/23/2020 at Unknown time  . pravastatin (PRAVACHOL) 20 MG tablet Take 1 tablet (20 mg total) by mouth daily. 90 tablet 3 07/24/2020 at Unknown time  . vitamin E 400 UNIT capsule Take 400 Units by mouth daily.    07/23/2020 at Unknown time  . ACCU-CHEK AVIVA PLUS test strip USE TO TEST BLOOD SUGAR ONCE DAILY 100 strip 1   . Accu-Chek Softclix Lancets lancets Use to check blood sugar once daily 100 each 2   . Lancet Devices (ACCU-CHEK SOFTCLIX) lancets Use to test blood sugar once a day. 100 each 2    Scheduled:  . allopurinol  100 mg Oral BID  . irbesartan  75 mg Oral Daily   And  . amLODipine  5 mg Oral Daily  . buPROPion  75 mg Oral BID  . Chlorhexidine Gluconate Cloth  6 each Topical Daily  . clopidogrel   75 mg Oral Daily  . colchicine  0.6 mg Oral BID  . DULoxetine  60 mg Oral Daily  . multivitamin with minerals  1 tablet Oral Daily  . omega-3 acid ethyl esters  1 g Oral Daily  . pantoprazole  20 mg Oral QODAY  . pravastatin  20 mg Oral Daily  . vitamin E  400 Units Oral Daily    Assessment: 67 yoF with PMH HTN, DM, CVA 2017, presenting with SOB and radiating chest pain several hours ago. CTA shows bulky saddle PE   Prior anticoagulation: none PTA, only on Plavix 75 mg/d; LD 4/29 AM  Baseline protime/INR, aptt WNL  Significant events:  Today, 07/25/2020:  Heparin level 0.74 - slightly above goal range on heparin 1050 units/hr  CBC: WNL  SCr up 1.22  No bleeding or infusion issues per nursing  Goal of Therapy: Heparin level 0.3-0.7 units/ml Monitor platelets by anticoagulation protocol: Yes  Plan:  Decrease Heparin 950 units/hr IV infusion  Check heparin level 8 hrs after rate change  Daily CBC, daily heparin level once stable  Monitor for signs of bleeding or thrombosis  Courtni Balash P. Legrand Como, PharmD, [place] Please utilize Amion for appropriate phone number to reach the unit pharmacist ([place]) 07/25/2020 8:26 AM

## 2020-07-25 NOTE — Progress Notes (Signed)
PROGRESS NOTE  Alexa Brown [id number] DOB: 21-Jul-1940   PCP: [place], Johnnetta G, MD  Patient is from: Home.  Ambulates independently at baseline.  DOA: 07/24/2020 LOS: 1  Chief complaints: Chest pressure and shortness of breath  Brief Narrative / Interim history: 80 year old F with PMH of CKD-3A, DM-2, CVA, HTN, HLD and depression presenting with chest pressure, shortness of breath and hypoxemia to 88%, and admitted for submassive saddle PE with right heart strain.  Started on IV heparin.  Subjective: Seen and examined earlier this morning.  No major events overnight of this morning.  Reports improvement in her chest pressure and breathing.  Denies GI or UTI symptoms.  Denies leg swelling or pain.  No recent immobility or long travel.  No previous history of blood clots.  No history of cancer, tobacco use or hormonal therapy.  Reportedly no significant finding on her last colonoscopy.  Objective: Vitals:   07/25/20 0900 07/25/20 0910 07/25/20 1000 07/25/20 1100  BP: (!) 124/54  (!) 119/28 (!) 124/56  Pulse: 83  82 78  Resp: 18  19 17   Temp:  97.9 F (36.6 C)    TempSrc:  Oral    SpO2: 92%  94% 93%  Weight:      Height:        Intake/Output Summary (Last 24 hours) at 07/25/2020 1107 Last data filed at 07/25/2020 0900 Gross per 24 hour  Intake 413.7 ml  Output 450 ml  Net -36.3 ml   Filed Weights   07/24/20 0840 07/24/20 1300  Weight: 68.9 kg 71 kg    Examination:  GENERAL: No apparent distress.  Nontoxic. HEENT: MMM.  Vision and hearing grossly intact.  NECK: Supple.  No apparent JVD.  RESP: 95% on 2 L.  No IWOB.  Fair aeration bilaterally. CVS:  RRR. Heart sounds normal.  ABD/GI/GU: BS+. Abd soft, NTND.  MSK/EXT:  Moves extremities. No apparent deformity. No edema.  No calf tenderness. SKIN: no apparent skin lesion or wound NEURO: Awake, alert and oriented appropriately.  No apparent focal neuro deficit. PSYCH: Calm. Normal affect.  Procedures:   None  Microbiology summarized: [id number] and influenza PCR nonreactive. MRSA PCR nonreactive.  Assessment & Plan: Acute submassive saddle PE with right heart strain-seems unprovoked.  Hemodynamically stable.  Improving. -Continue IV heparin at least for a total of 48 hours, then transition to starter pack Eliquis -She probably needs anticoagulation indefinitely -Follow TTE and LE [place] -Appreciate input by PCCM  Elevated troponin: High-sensitivity trop 107>211.  Likely demand ischemia from PE.  EKG without acute ischemic finding.  -Already on IV heparin for PE -Recheck troponin to see downtrend  CKD-3A/azotemia: Cr slightly higher than baseline. Recent Labs    08/07/19 1114 02/11/20 0905 07/14/20 0917 07/24/20 0854 07/25/20 0246  BUN 18 18 19  25* 28*  CREATININE 0.96 1.00* 1.03 1.05* 1.22*  -Continue monitoring -Avoid nephrotoxic meds  Prediabetes: Last A1c 6.2% on 07/14/2020  History of CVA -Continue home statin -We will discontinue Plavix while on anticoagulation given risk for bleeding   Essential HTN: Normotensive -Continue home meds  History of depression: Stable -Continue home Wellbutrin and Cymbalta    Body mass index is 24.52 kg/m.         DVT prophylaxis:  On IV heparin for PE  Code Status: Partial code without intubation Family Communication: Updated patient's daughter at bedside Level of care: Stepdown Status is: Inpatient  Remains inpatient appropriate because:Ongoing diagnostic testing needed not appropriate for outpatient work up, IV treatments  appropriate due to intensity of illness or inability to take PO and Inpatient level of care appropriate due to severity of illness   Dispo: The patient is from: Home              Anticipated d/c is to: Home              Patient currently is not medically stable to d/c.   Difficult to place patient No       Consultants:  PCCM   Sch Meds:  Scheduled Meds: . allopurinol  100 mg Oral BID   . irbesartan  75 mg Oral Daily   And  . amLODipine  5 mg Oral Daily  . buPROPion  75 mg Oral BID  . Chlorhexidine Gluconate Cloth  6 each Topical Daily  . clopidogrel  75 mg Oral Daily  . colchicine  0.6 mg Oral BID  . DULoxetine  60 mg Oral Daily  . multivitamin with minerals  1 tablet Oral Daily  . omega-3 acid ethyl esters  1 g Oral Daily  . pantoprazole  20 mg Oral QODAY  . pravastatin  20 mg Oral Daily  . vitamin E  400 Units Oral Daily   Continuous Infusions: . heparin 950 Units/hr (07/25/20 0927)   PRN Meds:.acetaminophen, ondansetron **OR** ondansetron (ZOFRAN) IV  Antimicrobials: Anti-infectives (From admission, onward)   None       I have personally reviewed the following labs and images: CBC: Recent Labs  Lab 07/24/20 0854 07/25/20 0246  WBC 7.0 7.6  NEUTROABS 4.7  --   HGB 14.7 13.9  HCT 44.4 42.2  MCV 91.9 93.2  PLT 231 237   BMP &GFR Recent Labs  Lab 07/24/20 0854 07/25/20 0246  NA 144 140  K 4.4 4.0  CL 113* 111  CO2 21* 24  GLUCOSE 132* 122*  BUN 25* 28*  CREATININE 1.05* 1.22*  CALCIUM 10.2 9.3   Estimated Creatinine Clearance: 37 mL/min (A) (by C-G formula based on SCr of 1.22 mg/dL (H)). Liver & Pancreas: Recent Labs  Lab 07/24/20 0854 07/25/20 0246  AST 24 18  ALT 16 14  ALKPHOS 53 45  BILITOT 0.5 0.2*  PROT 7.5 6.4*  ALBUMIN 4.2 3.4*   No results for input(s): LIPASE, AMYLASE in the last 168 hours. No results for input(s): AMMONIA in the last 168 hours. Diabetic: No results for input(s): HGBA1C in the last 72 hours. Recent Labs  Lab 07/25/20 0846  GLUCAP 111*   Cardiac Enzymes: No results for input(s): CKTOTAL, CKMB, CKMBINDEX, TROPONINI in the last 168 hours. No results for input(s): PROBNP in the last 8760 hours. Coagulation Profile: Recent Labs  Lab 07/24/20 0854  INR 1.0   Thyroid Function Tests: No results for input(s): TSH, T4TOTAL, FREET4, T3FREE, THYROIDAB in the last 72 hours. Lipid Profile: No  results for input(s): CHOL, HDL, LDLCALC, TRIG, CHOLHDL, LDLDIRECT in the last 72 hours. Anemia Panel: No results for input(s): VITAMINB12, FOLATE, FERRITIN, TIBC, IRON, RETICCTPCT in the last 72 hours. Urine analysis: No results found for: COLORURINE, APPEARANCEUR, LABSPEC, PHURINE, GLUCOSEU, HGBUR, BILIRUBINUR, KETONESUR, PROTEINUR, UROBILINOGEN, NITRITE, LEUKOCYTESUR Sepsis Labs: Invalid input(s): PROCALCITONIN, [place]  Microbiology: Recent Results (from the past 240 hour(s))  Resp Panel by RT-PCR (Flu A&B, Covid) Nasopharyngeal Swab     Status: None   Collection Time: 07/24/20  9:40 AM   Specimen: Nasopharyngeal Swab; Nasopharyngeal(NP) swabs in vial transport medium  Result Value Ref Range Status   SARS Coronavirus 2 by RT PCR NEGATIVE  NEGATIVE Final    Comment: (NOTE) SARS-CoV-2 target nucleic acids are NOT DETECTED.  The SARS-CoV-2 RNA is generally detectable in upper respiratory specimens during the acute phase of infection. The lowest concentration of SARS-CoV-2 viral copies this assay can detect is 138 copies/mL. A negative result does not preclude SARS-Cov-2 infection and should not be used as the sole basis for treatment or other patient management decisions. A negative result may occur with  improper specimen collection/handling, submission of specimen other than nasopharyngeal swab, presence of viral mutation(s) within the areas targeted by this assay, and inadequate number of viral copies(<138 copies/mL). A negative result must be combined with clinical observations, patient history, and epidemiological information. The expected result is Negative.  Fact Sheet for Patients:  EntrepreneurPulse.com.au  Fact Sheet for Healthcare Providers:  IncredibleEmployment.be  This test is no t yet approved or cleared by the [place] FDA and  has been authorized for detection and/or diagnosis of SARS-CoV-2 by FDA under an Emergency  Use Authorization (EUA). This EUA will remain  in effect (meaning this test can be used) for the duration of the COVID-19 declaration under Section 564(b)(1) of the Act, 21 U.S.C.section 360bbb-3(b)(1), unless the authorization is terminated  or revoked sooner.       Influenza A by PCR NEGATIVE NEGATIVE Final   Influenza B by PCR NEGATIVE NEGATIVE Final    Comment: (NOTE) The Xpert Xpress SARS-CoV-2/FLU/RSV plus assay is intended as an aid in the diagnosis of influenza from Nasopharyngeal swab specimens and should not be used as a sole basis for treatment. Nasal washings and aspirates are unacceptable for Xpert Xpress SARS-CoV-2/FLU/RSV testing.  Fact Sheet for Patients: EntrepreneurPulse.com.au  Fact Sheet for Healthcare Providers: IncredibleEmployment.be  This test is not yet approved or cleared by the [place] FDA and has been authorized for detection and/or diagnosis of SARS-CoV-2 by FDA under an Emergency Use Authorization (EUA). This EUA will remain in effect (meaning this test can be used) for the duration of the COVID-19 declaration under Section 564(b)(1) of the Act, 21 U.S.C. section 360bbb-3(b)(1), unless the authorization is terminated or revoked.  Performed at [facility], [place] [address], [place], [place] [address]   MRSA PCR Screening     Status: None   Collection Time: 07/24/20  2:00 PM   Specimen: Nasopharyngeal  Result Value Ref Range Status   MRSA by PCR NEGATIVE NEGATIVE Final    Comment:        The GeneXpert MRSA Assay (FDA approved for NASAL specimens only), is one component of a comprehensive MRSA colonization surveillance program. It is not intended to diagnose MRSA infection nor to guide or monitor treatment for MRSA infections. Performed at [facility], [place] [address] [place], [place] [address]     Radiology Studies: VAS [place] LOWER EXTREMITY VENOUS  (DVT)  Result Date: 07/25/2020  Lower Venous DVT Study Patient Name:  RONDA RAJKUMAR  Date of Exam:   07/25/2020 Medical Rec #: [id number]               Accession #:    [id number] Date of Birth: 1940-11-17               Patient Gender: F Patient Age:   80Y Exam Location:  [facility] Procedure:      VAS [place] LOWER EXTREMITY VENOUS (DVT) Referring Phys: [id number] Bretta Bang T Shay Bartoli --------------------------------------------------------------------------------  Indications: Elevated ddimer.  Comparison Study: no prior Performing Technologist: Abram Sander RVS  Examination Guidelines:  A complete evaluation includes B-mode imaging, spectral Doppler, color Doppler, and power Doppler as needed of all accessible portions of each vessel. Bilateral testing is considered an integral part of a complete examination. Limited examinations for reoccurring indications may be performed as noted. The reflux portion of the exam is performed with the patient in reverse Trendelenburg.  +---------+---------------+---------+-----------+----------+--------------+ RIGHT    CompressibilityPhasicitySpontaneityPropertiesThrombus Aging +---------+---------------+---------+-----------+----------+--------------+ CFV      Full           Yes      Yes                                 +---------+---------------+---------+-----------+----------+--------------+ SFJ      Full                                                        +---------+---------------+---------+-----------+----------+--------------+ FV Prox  Full                                                        +---------+---------------+---------+-----------+----------+--------------+ FV Mid   Full                                                        +---------+---------------+---------+-----------+----------+--------------+ FV DistalFull                                                         +---------+---------------+---------+-----------+----------+--------------+ PFV      Full                                                        +---------+---------------+---------+-----------+----------+--------------+ POP      Full           Yes      Yes                                 +---------+---------------+---------+-----------+----------+--------------+ PTV      Full                                                        +---------+---------------+---------+-----------+----------+--------------+ PERO     Full                                                        +---------+---------------+---------+-----------+----------+--------------+   +---------+---------------+---------+-----------+----------+--------------+  LEFT     CompressibilityPhasicitySpontaneityPropertiesThrombus Aging +---------+---------------+---------+-----------+----------+--------------+ CFV      Full           Yes      Yes                                 +---------+---------------+---------+-----------+----------+--------------+ SFJ      Full                                                        +---------+---------------+---------+-----------+----------+--------------+ FV Prox  Full                                                        +---------+---------------+---------+-----------+----------+--------------+ FV Mid   Full                                                        +---------+---------------+---------+-----------+----------+--------------+ FV DistalFull                                                        +---------+---------------+---------+-----------+----------+--------------+ PFV      Full                                                        +---------+---------------+---------+-----------+----------+--------------+ POP      Full           Yes      Yes                                  +---------+---------------+---------+-----------+----------+--------------+ PTV      Full                                                        +---------+---------------+---------+-----------+----------+--------------+ PERO     Full                                                        +---------+---------------+---------+-----------+----------+--------------+     Summary: BILATERAL: - No evidence of deep vein thrombosis seen in the lower extremities, bilaterally. - No evidence of superficial venous thrombosis in the lower extremities, bilaterally. -No evidence of popliteal cyst, bilaterally.   *See table(s) above for measurements and observations.  Preliminary        Tamya Denardo T. Inman  If 7PM-7AM, please contact night-coverage [email or web address] 07/25/2020, 11:07 AM

## 2020-07-25 NOTE — Progress Notes (Signed)
[place] for IV heparin Indication: pulmonary embolus  No Known Allergies  Patient Measurements: Height: 5\' 7"  (170.2 cm) Weight: 71 kg (156 lb 8.4 oz) IBW/kg (Calculated) : 61.6 Heparin Dosing Weight: 71 kg  Vital Signs: Temp: 98 F (36.7 C) (04/30 1707) Temp Source: Axillary (04/30 1707) BP: 109/55 (04/30 1300) Pulse Rate: 85 (04/30 1300)  Labs: Recent Labs    07/24/20 0854 07/24/20 1054 07/24/20 2056 07/25/20 0246 07/25/20 0624 07/25/20 1151 07/25/20 1321 07/25/20 1644  HGB 14.7  --   --  13.9  --   --   --   --   HCT 44.4  --   --  42.2  --   --   --   --   PLT 231  --   --  237  --   --   --   --   APTT 23*  --   --   --   --   --   --   --   LABPROT 13.7  --   --   --   --   --   --   --   INR 1.0  --   --   --   --   --   --   --   HEPARINUNFRC  --   --  0.78*  --  0.74*  --   --  0.55  CREATININE 1.05*  --   --  1.22*  --   --   --   --   TROPONINIHS 107* 215*  --   --   --  128* 134*  --     Estimated Creatinine Clearance: 37 mL/min (A) (by C-G formula based on SCr of 1.22 mg/dL (H)).  Assessment: 89 yoF with PMH HTN, DM, CVA 2017, presenting with SOB and radiating chest pain several hours ago. CTA shows bulky saddle PE   Prior anticoagulation: none PTA, only on Plavix 75 mg/d; LD 4/29 AM  Baseline protime/INR, aptt WNL  Significant events:  Today, 07/25/2020: 2nd shift  Heparin level @ 1644 = 0.55 - therapeutic after heparin drip reduced to 950 units/hr  CBC: WNL  SCr up 1.22  No bleeding or infusion issues per nursing  Goal of Therapy: Heparin level 0.3-0.7 units/ml Monitor platelets by anticoagulation protocol: Yes  Plan:  continue Heparin 950 units/hr IV infusion  Daily CBC, daily heparin level   Monitor for signs of bleeding or thrombosis  Eudelia Bunch, Pharm.D Clinical Pharmacist [place] Please utilize Amion for appropriate phone number to reach the unit pharmacist ([place]) 07/25/2020 6:09 PM

## 2020-07-26 LAB — CBC
HCT: 42 % (ref 36.0–46.0)
Hemoglobin: 14 g/dL (ref 12.0–15.0)
MCH: 31 pg (ref 26.0–34.0)
MCHC: 33.3 g/dL (ref 30.0–36.0)
MCV: 93.1 fL (ref 80.0–100.0)
Platelets: 219 10*3/uL (ref 150–400)
RBC: 4.51 MIL/uL (ref 3.87–5.11)
RDW: 12.8 % (ref 11.5–15.5)
WBC: 6.4 10*3/uL (ref 4.0–10.5)
nRBC: 0 % (ref 0.0–0.2)

## 2020-07-26 LAB — RENAL FUNCTION PANEL
Albumin: 3.6 g/dL (ref 3.5–5.0)
Anion gap: 7 (ref 5–15)
BUN: 23 mg/dL (ref 8–23)
CO2: 23 mmol/L (ref 22–32)
Calcium: 9.4 mg/dL (ref 8.9–10.3)
Chloride: 112 mmol/L — ABNORMAL HIGH (ref 98–111)
Creatinine, Ser: 1.08 mg/dL — ABNORMAL HIGH (ref 0.44–1.00)
GFR, Estimated: 53 mL/min — ABNORMAL LOW (ref >60)
Glucose, Bld: 118 mg/dL — ABNORMAL HIGH (ref 70–99)
Phosphorus: 3.2 mg/dL (ref 2.5–4.6)
Potassium: 4.2 mmol/L (ref 3.5–5.1)
Sodium: 142 mmol/L (ref 135–145)

## 2020-07-26 LAB — HEPARIN LEVEL (UNFRACTIONATED): Heparin Unfractionated: 0.51 IU/mL (ref 0.30–0.70)

## 2020-07-26 LAB — MAGNESIUM: Magnesium: 2.2 mg/dL (ref 1.7–2.4)

## 2020-07-26 LAB — GLUCOSE, CAPILLARY: Glucose-Capillary: 111 mg/dL — ABNORMAL HIGH (ref 70–99)

## 2020-07-26 NOTE — Progress Notes (Addendum)
PROGRESS NOTE  Alexa Brown EPP:[id number] DOB: 1940/05/06   PCP: Martinique, Cherlyn G, MD  Patient is from: Home.  Ambulates independently at baseline.  DOA: 07/24/2020 LOS: 2  Chief complaints: Chest pressure and shortness of breath  Brief Narrative / Interim history: 80 year old F with PMH of CKD-3A, DM-2, CVA, HTN, HLD and depression presenting with chest pressure, shortness of breath and hypoxemia to 88%, and admitted for submassive saddle PE with right heart strain.  Started on IV heparin.  PCCM recommended IV heparin for 48 to 72 hours before transitioning to [place].  TTE with LVEF of 70 to 75%, G1-DD and mildly enlarged RV and mildly reduced RVSF.   Subjective: Seen and examined earlier this morning.  No major events overnight of this morning.  No complaints.  She denies chest pain, dyspnea, dizziness, GI or UTI symptoms.  She is scheduled to go home tomorrow if things go well.  Objective: Vitals:   07/26/20 0900 07/26/20 0942 07/26/20 1025 07/26/20 1030  BP: 100/83 100/83 133/69   Pulse: 90  85   Resp: (!) 25  18   Temp:   97.7 F (36.5 C)   TempSrc:   Oral   SpO2: 92%  95% 95%  Weight:      Height:        Intake/Output Summary (Last 24 hours) at 07/26/2020 1134 Last data filed at 07/26/2020 1030 Gross per 24 hour  Intake 570.73 ml  Output 1900 ml  Net -1329.27 ml   Filed Weights   07/24/20 0840 07/24/20 1300  Weight: 68.9 kg 71 kg    Examination:  GENERAL: No apparent distress.  Nontoxic. HEENT: MMM.  Vision and hearing grossly intact.  NECK: Supple.  No apparent JVD.  RESP: 92 to 95% on RA.  No IWOB.  Fair aeration bilaterally. CVS:  RRR. Heart sounds normal.  ABD/GI/GU: BS+. Abd soft, NTND.  MSK/EXT:  Moves extremities. No apparent deformity. No edema.  SKIN: no apparent skin lesion or wound NEURO: Awake, alert and oriented appropriately.  No apparent focal neuro deficit. PSYCH: Calm. Normal affect.   Procedures:  None  Microbiology  summarized: SAYTK-16 and influenza PCR nonreactive. MRSA PCR nonreactive.  Assessment & Plan: Acute submassive saddle PE with right heart strain-seems unprovoked.  Hemodynamically stable.  TTE as above.  LE [place] negative. -Continue IV heparin.  Will transition to p.o. Eliquis on 5/2 -Probably needs anticoagulation indefinitely -Appreciate input by PCCM  Elevated troponin: High-sensitivity trop 107>211> 128> 134.  Likely demand ischemia from PE.  EKG without acute ischemic finding.  TTE is reassuring. -Already on IV heparin for PE  CKD-3A/azotemia: At baseline. Recent Labs    08/07/19 1114 02/11/20 0905 07/14/20 0917 07/24/20 0854 07/25/20 0246 07/26/20 0257  BUN 18 18 19  25* 28* 23  CREATININE 0.96 1.00* 1.03 1.05* 1.22* 1.08*  -Continue monitoring -Avoid nephrotoxic meds  Prediabetes: Last A1c 6.2% on 07/14/2020  History of CVA -Continue home statin -Discontinue Plavix while on anticoagulation given risk for bleeding   Essential HTN: Normotensive -Continue home meds  History of depression: Stable -Continue home Wellbutrin and Cymbalta    Body mass index is 24.52 kg/m.         DVT prophylaxis:  On IV heparin for PE  Code Status: Partial code without intubation Family Communication: Attempted to call patient's daughter for update but no answer (addendum). Level of care: Telemetry Status is: Inpatient  Remains inpatient appropriate because:IV treatments appropriate due to intensity of illness or inability to take  PO and Inpatient level of care appropriate due to severity of illness   Dispo: The patient is from: Home              Anticipated d/c is to: Home              Patient currently is not medically stable to d/c.   Difficult to place patient No       Consultants:  PCCM   Sch Meds:  Scheduled Meds: . allopurinol  100 mg Oral BID  . irbesartan  75 mg Oral Daily   And  . amLODipine  5 mg Oral Daily  . buPROPion  75 mg Oral BID  .  colchicine  0.6 mg Oral BID  . DULoxetine  60 mg Oral Daily  . multivitamin with minerals  1 tablet Oral Daily  . omega-3 acid ethyl esters  1 g Oral Daily  . pantoprazole  20 mg Oral QODAY  . pravastatin  20 mg Oral Daily  . vitamin E  400 Units Oral Daily   Continuous Infusions: . heparin 950 Units/hr (07/26/20 0424)   PRN Meds:.acetaminophen, ondansetron **OR** ondansetron (ZOFRAN) IV  Antimicrobials: Anti-infectives (From admission, onward)   None       I have personally reviewed the following labs and images: CBC: Recent Labs  Lab 07/24/20 0854 07/25/20 0246 07/26/20 0257  WBC 7.0 7.6 6.4  NEUTROABS 4.7  --   --   HGB 14.7 13.9 14.0  HCT 44.4 42.2 42.0  MCV 91.9 93.2 93.1  PLT 231 237 219   BMP &GFR Recent Labs  Lab 07/24/20 0854 07/25/20 0246 07/26/20 0257  NA 144 140 142  K 4.4 4.0 4.2  CL 113* 111 112*  CO2 21* 24 23  GLUCOSE 132* 122* 118*  BUN 25* 28* 23  CREATININE 1.05* 1.22* 1.08*  CALCIUM 10.2 9.3 9.4  MG  --   --  2.2  PHOS  --   --  3.2   Estimated Creatinine Clearance: 41.7 mL/min (A) (by C-G formula based on SCr of 1.08 mg/dL (H)). Liver & Pancreas: Recent Labs  Lab 07/24/20 0854 07/25/20 0246 07/26/20 0257  AST 24 18  --   ALT 16 14  --   ALKPHOS 53 45  --   BILITOT 0.5 0.2*  --   PROT 7.5 6.4*  --   ALBUMIN 4.2 3.4* 3.6   No results for input(s): LIPASE, AMYLASE in the last 168 hours. No results for input(s): AMMONIA in the last 168 hours. Diabetic: No results for input(s): HGBA1C in the last 72 hours. Recent Labs  Lab 07/25/20 0846 07/26/20 0821  GLUCAP 111* 111*   Cardiac Enzymes: No results for input(s): CKTOTAL, CKMB, CKMBINDEX, TROPONINI in the last 168 hours. No results for input(s): PROBNP in the last 8760 hours. Coagulation Profile: Recent Labs  Lab 07/24/20 0854  INR 1.0   Thyroid Function Tests: No results for input(s): TSH, T4TOTAL, FREET4, T3FREE, THYROIDAB in the last 72 hours. Lipid Profile: No  results for input(s): CHOL, HDL, LDLCALC, TRIG, CHOLHDL, LDLDIRECT in the last 72 hours. Anemia Panel: No results for input(s): VITAMINB12, FOLATE, FERRITIN, TIBC, IRON, RETICCTPCT in the last 72 hours. Urine analysis: No results found for: COLORURINE, APPEARANCEUR, LABSPEC, PHURINE, GLUCOSEU, HGBUR, BILIRUBINUR, KETONESUR, PROTEINUR, UROBILINOGEN, NITRITE, LEUKOCYTESUR Sepsis Labs: Invalid input(s): PROCALCITONIN, [place]  Microbiology: Recent Results (from the past 240 hour(s))  Resp Panel by RT-PCR (Flu A&B, Covid) Nasopharyngeal Swab     Status: None   Collection  Time: 07/24/20  9:40 AM   Specimen: Nasopharyngeal Swab; Nasopharyngeal(NP) swabs in vial transport medium  Result Value Ref Range Status   SARS Coronavirus 2 by RT PCR NEGATIVE NEGATIVE Final    Comment: (NOTE) SARS-CoV-2 target nucleic acids are NOT DETECTED.  The SARS-CoV-2 RNA is generally detectable in upper respiratory specimens during the acute phase of infection. The lowest concentration of SARS-CoV-2 viral copies this assay can detect is 138 copies/mL. A negative result does not preclude SARS-Cov-2 infection and should not be used as the sole basis for treatment or other patient management decisions. A negative result may occur with  improper specimen collection/handling, submission of specimen other than nasopharyngeal swab, presence of viral mutation(s) within the areas targeted by this assay, and inadequate number of viral copies(<138 copies/mL). A negative result must be combined with clinical observations, patient history, and epidemiological information. The expected result is Negative.  Fact Sheet for Patients:  EntrepreneurPulse.com.au  Fact Sheet for Healthcare Providers:  IncredibleEmployment.be  This test is no t yet approved or cleared by the [place] FDA and  has been authorized for detection and/or diagnosis of SARS-CoV-2 by FDA under an Emergency  Use Authorization (EUA). This EUA will remain  in effect (meaning this test can be used) for the duration of the COVID-19 declaration under Section 564(b)(1) of the Act, 21 U.S.C.section 360bbb-3(b)(1), unless the authorization is terminated  or revoked sooner.       Influenza A by PCR NEGATIVE NEGATIVE Final   Influenza B by PCR NEGATIVE NEGATIVE Final    Comment: (NOTE) The Xpert Xpress SARS-CoV-2/FLU/RSV plus assay is intended as an aid in the diagnosis of influenza from Nasopharyngeal swab specimens and should not be used as a sole basis for treatment. Nasal washings and aspirates are unacceptable for Xpert Xpress SARS-CoV-2/FLU/RSV testing.  Fact Sheet for Patients: EntrepreneurPulse.com.au  Fact Sheet for Healthcare Providers: IncredibleEmployment.be  This test is not yet approved or cleared by the [place] FDA and has been authorized for detection and/or diagnosis of SARS-CoV-2 by FDA under an Emergency Use Authorization (EUA). This EUA will remain in effect (meaning this test can be used) for the duration of the COVID-19 declaration under Section 564(b)(1) of the Act, 21 U.S.C. section 360bbb-3(b)(1), unless the authorization is terminated or revoked.  Performed at [facility], [place] [address] [place], [place] [address]   MRSA PCR Screening     Status: None   Collection Time: 07/24/20  2:00 PM   Specimen: Nasopharyngeal  Result Value Ref Range Status   MRSA by PCR NEGATIVE NEGATIVE Final    Comment:        The GeneXpert MRSA Assay (FDA approved for NASAL specimens only), is one component of a comprehensive MRSA colonization surveillance program. It is not intended to diagnose MRSA infection nor to guide or monitor treatment for MRSA infections. Performed at [facility], [place] [address] [place], [place] [address]     Radiology Studies: No results found.     Elad Macphail T.  [place]  If 7PM-7AM, please contact night-coverage [email or web address] 07/26/2020, 11:34 AM

## 2020-07-26 NOTE — Progress Notes (Addendum)
Pt did well overall with short mobility in hall though her legs did grow weaker while returning back to bed after short walk. Pt activity intolerance also remains though 02 sat remained normal.

## 2020-07-26 NOTE — Progress Notes (Signed)
ANTICOAGULATION CONSULT NOTE  Pharmacy Consult for IV heparin Indication: pulmonary embolus  No Known Allergies  Patient Measurements: Height: 5\' 7"  (170.2 cm) Weight: 71 kg (156 lb 8.4 oz) IBW/kg (Calculated) : 61.6 Heparin Dosing Weight: 71 kg  Vital Signs: Temp: 97.6 F (36.4 C) (05/01 0845) Temp Source: Axillary (05/01 0845) BP: 135/56 (05/01 0700) Pulse Rate: 71 (05/01 0700)  Labs: Recent Labs    07/24/20 0854 07/24/20 1054 07/24/20 2056 07/25/20 0246 07/25/20 0624 07/25/20 1151 07/25/20 1321 07/25/20 1644 07/26/20 0257  HGB 14.7  --   --  13.9  --   --   --   --  14.0  HCT 44.4  --   --  42.2  --   --   --   --  42.0  PLT 231  --   --  237  --   --   --   --  219  APTT 23*  --   --   --   --   --   --   --   --   LABPROT 13.7  --   --   --   --   --   --   --   --   INR 1.0  --   --   --   --   --   --   --   --   HEPARINUNFRC  --   --    < >  --  0.74*  --   --  0.55 0.51  CREATININE 1.05*  --   --  1.22*  --   --   --   --  1.08*  TROPONINIHS 107* 215*  --   --   --  128* 134*  --   --    < > = values in this interval not displayed.    Estimated Creatinine Clearance: 41.7 mL/min (A) (by C-G formula based on SCr of 1.08 mg/dL (H)).  Assessment: 47 yoF with PMH HTN, DM, CVA 2017, presenting with SOB and radiating chest pain several hours ago. CTA shows bulky saddle PE   Prior anticoagulation: none PTA, only on Plavix 75 mg/d; LD 4/29 AM  Baseline protime/INR, aptt WNL  Significant events:  Today, 07/26/2020:  Heparin level @ 0257 = 0.51 - therapeutic with heparin infusing at 950 units/hour  CBC: WNL  SCr down 1.08  No bleeding or infusion issues per nursing  Goal of Therapy: Heparin level 0.3-0.7 units/ml Monitor platelets by anticoagulation protocol: Yes  Plan:  Continue Heparin 950 units/hr IV infusion  Daily CBC, daily heparin level   Monitor for signs of bleeding or thrombosis  Shela Commons, PharmD, BCPS Clinical  Pharmacist [place] Please utilize Amion for appropriate phone number to reach the unit pharmacist ([place]) 07/26/2020 8:50 AM

## 2020-07-26 NOTE — Progress Notes (Signed)
Pt stable on arrival to floor from ICU. No needs at time of assessment and vs. Pt denies pain though shortness of breath and cough with moderate exertion remains. Rn will continue to monitor.

## 2020-07-26 NOTE — Plan of Care (Signed)
  Problem: Clinical Measurements: Goal: Diagnostic test results will improve Outcome: Progressing   Problem: Clinical Measurements: Goal: Respiratory complications will improve Outcome: Progressing   Problem: Clinical Measurements: Goal: Cardiovascular complication will be avoided Outcome: Progressing   Problem: Nutrition: Goal: Adequate nutrition will be maintained Outcome: Progressing   Problem: Coping: Goal: Level of anxiety will decrease Outcome: Progressing   

## 2020-07-27 ENCOUNTER — Telehealth: Payer: Self-pay | Admitting: Family Medicine

## 2020-07-27 ENCOUNTER — Ambulatory Visit: Payer: Medicare Other | Admitting: Podiatry

## 2020-07-27 ENCOUNTER — Ambulatory Visit: Payer: Medicare Other | Admitting: Family Medicine

## 2020-07-27 DIAGNOSIS — R7989 Other specified abnormal findings of blood chemistry: Secondary | ICD-10-CM

## 2020-07-27 DIAGNOSIS — N179 Acute kidney failure, unspecified: Secondary | ICD-10-CM

## 2020-07-27 DIAGNOSIS — I5032 Chronic diastolic (congestive) heart failure: Secondary | ICD-10-CM

## 2020-07-27 DIAGNOSIS — R531 Weakness: Secondary | ICD-10-CM

## 2020-07-27 LAB — TROPONIN I (HIGH SENSITIVITY)
Troponin I (High Sensitivity): 25 ng/L — ABNORMAL HIGH (ref <18)
Troponin I (High Sensitivity): 24 ng/L — ABNORMAL HIGH (ref <18)

## 2020-07-27 LAB — CBC
HCT: 41.1 % (ref 36.0–46.0)
Hemoglobin: 13.6 g/dL (ref 12.0–15.0)
MCH: 30.8 pg (ref 26.0–34.0)
MCHC: 33.1 g/dL (ref 30.0–36.0)
MCV: 93.2 fL (ref 80.0–100.0)
Platelets: 223 10*3/uL (ref 150–400)
RBC: 4.41 MIL/uL (ref 3.87–5.11)
RDW: 12.8 % (ref 11.5–15.5)
WBC: 5.2 10*3/uL (ref 4.0–10.5)
nRBC: 0 % (ref 0.0–0.2)

## 2020-07-27 LAB — RENAL FUNCTION PANEL
Albumin: 3.4 g/dL — ABNORMAL LOW (ref 3.5–5.0)
Anion gap: 9 (ref 5–15)
BUN: 21 mg/dL (ref 8–23)
CO2: 21 mmol/L — ABNORMAL LOW (ref 22–32)
Calcium: 9.2 mg/dL (ref 8.9–10.3)
Chloride: 111 mmol/L (ref 98–111)
Creatinine, Ser: 0.96 mg/dL (ref 0.44–1.00)
GFR, Estimated: >60 mL/min (ref >60)
Glucose, Bld: 104 mg/dL — ABNORMAL HIGH (ref 70–99)
Phosphorus: 2.9 mg/dL (ref 2.5–4.6)
Potassium: 4.2 mmol/L (ref 3.5–5.1)
Sodium: 141 mmol/L (ref 135–145)

## 2020-07-27 LAB — HEPARIN LEVEL (UNFRACTIONATED): Heparin Unfractionated: 0.43 IU/mL (ref 0.30–0.70)

## 2020-07-27 LAB — MAGNESIUM: Magnesium: 2 mg/dL (ref 1.7–2.4)

## 2020-07-27 LAB — GLUCOSE, CAPILLARY: Glucose-Capillary: 111 mg/dL — ABNORMAL HIGH (ref 70–99)

## 2020-07-27 MED ORDER — APIXABAN 5 MG PO TABS
10.0000 mg | ORAL_TABLET | Freq: Two times a day (BID) | ORAL | Status: DC
  Administered 2020-07-27: 10 mg via ORAL
  Filled 2020-07-27: qty 2

## 2020-07-27 MED ORDER — APIXABAN 5 MG PO TABS: 5.0000 mg | ORAL_TABLET | Freq: Two times a day (BID) | ORAL | Status: DC

## 2020-07-27 MED ORDER — APIXABAN (ELIQUIS) VTE STARTER PACK (10MG AND 5MG): ORAL_TABLET | ORAL | 0 refills | Status: DC

## 2020-07-27 MED ORDER — APIXABAN 5 MG PO TABS: 5.0000 mg | ORAL_TABLET | Freq: Two times a day (BID) | ORAL | 1 refills | Status: DC

## 2020-07-27 NOTE — Discharge Instructions (Signed)
Information on my medicine - ELIQUIS (apixaban)  This medication education was reviewed with me or my healthcare representative as part of my discharge preparation.  The pharmacist that spoke with me during my hospital stay was:  Minda Ditto, [facility]  Why was Eliquis prescribed for you? Eliquis was prescribed to treat blood clots that may have been found in the veins of your legs (deep vein thrombosis) or in your lungs (pulmonary embolism) and to reduce the risk of them occurring again.  What do You need to know about Eliquis ? The starting dose is 10 mg (two 5 mg tablets) taken TWICE daily for the FIRST SEVEN (7) DAYS, then on (enter date)  08/03/2020  the dose is reduced to ONE 5 mg tablet taken TWICE daily.  Eliquis may be taken with or without food.   Try to take the dose about the same time in the morning and in the evening. If you have difficulty swallowing the tablet whole please discuss with your pharmacist how to take the medication safely.  Take Eliquis exactly as prescribed and DO NOT stop taking Eliquis without talking to the doctor who prescribed the medication.  Stopping may increase your risk of developing a new blood clot.  Refill your prescription before you run out.  After discharge, you should have regular check-up appointments with your healthcare provider that is prescribing your Eliquis.    What do you do if you miss a dose? If a dose of ELIQUIS is not taken at the scheduled time, take it as soon as possible on the same day and twice-daily administration should be resumed. The dose should not be doubled to make up for a missed dose.  Important Safety Information A possible side effect of Eliquis is bleeding. You should call your healthcare provider right away if you experience any of the following: ? Bleeding from an injury or your nose that does not stop. ? Unusual colored urine (red or dark brown) or unusual colored stools (red or black). ? Unusual bruising for  unknown reasons. ? A serious fall or if you hit your head (even if there is no bleeding).  Some medicines may interact with Eliquis and might increase your risk of bleeding or clotting while on Eliquis. To help avoid this, consult your healthcare provider or pharmacist prior to using any new prescription or non-prescription medications, including herbals, vitamins, non-steroidal anti-inflammatory drugs (NSAIDs) and supplements.  This website has more information on Eliquis (apixaban): http://www.eliquis.com/eliquis/home

## 2020-07-27 NOTE — Telephone Encounter (Signed)
The patient had a CT ANGIO CHEST PE W/CM &/OR WO while in the hospital. Dr. Martinique has ordered a CT CHEST W CONTRAST; she is scheduled for 07/29/2020 @10 :30 am.  pt wants to know if she can have this canceled since it was done in the hospital please advise

## 2020-07-27 NOTE — TOC Transition Note (Signed)
Transition of Care [facility]) - CM/SW Discharge Note   Patient Details  Name: Alexa Brown MRN: [id number] Date of Birth: Jan 29, 1941  [facility] of [facility]) CM/SW Contact:  Lennart Pall, LCSW Phone Number: 07/27/2020, 11:37 AM   Clinical Narrative:    Pt medically cleared for dc today and HHPT has been recommended.  Pt and daughter agreeable with no agency preference.  MD has placed orders and referral made to [facility].  No further TOC needs.   Final next level of care: Home w Home Health Services Barriers to Discharge: Barriers Resolved   Patient Goals and CMS Choice Patient states their goals for this hospitalization and ongoing recovery are:: return home      Discharge Placement                       Discharge Plan and Services                DME Arranged: N/A DME Agency: NA       HH Arranged: PT HH Agency: [place] ([facility]) Date HH Agency Contacted: 07/27/20 Time [place]: 0350 Representative spoke with at [place]: [place].  Social Determinants of Health (SDOH) Interventions     Readmission Risk Interventions No flowsheet data found.

## 2020-07-27 NOTE — Telephone Encounter (Signed)
Does she still to do the other CT?

## 2020-07-27 NOTE — Plan of Care (Signed)
  Problem: Activity: Goal: Risk for activity intolerance will decrease Outcome: Progressing   

## 2020-07-27 NOTE — Evaluation (Signed)
Physical Therapy One Time Evaluation Patient Details Name: Alexa Brown MRN: [id number] DOB: 09/05/1940 Today's Date: 07/27/2020   History of Present Illness  80 year old F with PMH of CKD-3A, DM-2, CVA, HTN, HLD and depression presenting with chest pressure, shortness of breath and hypoxemia to 88%, and admitted for submassive saddle PE with right heart strain.  Started on IV heparin.  PCCM recommended IV heparin for 48 to 72 hours before transitioning to [place].  Clinical Impression  Patient evaluated by Physical Therapy with no further acute PT needs identified. All education has been completed and the patient has no further questions.  Pt ambulated in hallway using RW today.  Pt typically ambulates with cane and agreeable to use RW upon d/c for safety.  SpO2 was 93-97% on room air during ambulation.  Pt agreeable to HHPT for improving strength and balance.  See below for any follow-up Physical Therapy or equipment needs. PT is signing off. Thank you for this referral.     Follow Up Recommendations Home health PT    Equipment Recommendations  None recommended by PT    Recommendations for Other Services       Precautions / Restrictions Precautions Precautions: Fall Precaution Comments: monitor sats Restrictions Weight Bearing Restrictions: No      Mobility  Bed Mobility Overal bed mobility: Needs Assistance Bed Mobility: Supine to Sit     Supine to sit: HOB elevated;Supervision          Transfers Overall transfer level: Needs assistance Equipment used: Rolling walker (2 wheeled) Transfers: Sit to/from Stand Sit to Stand: Min guard         General transfer comment: cues for hand placement  Ambulation/Gait Ambulation/Gait assistance: Min guard Gait Distance (Feet): 300 Feet Assistive device: Rolling walker (2 wheeled) Gait Pattern/deviations: Step-through pattern;Decreased stride length     General Gait Details: utilized RW for safety and endurance  today; Spo2 93-97% on room air, pt reports only mild dyspnea upon returning to room, no overt LOB or unsteadiness observed  Stairs            Wheelchair Mobility    Modified Rankin (Stroke Patients Only)       Balance Overall balance assessment: History of Falls                                           Pertinent Vitals/Pain Pain Assessment: No/denies pain Pain Intervention(s): Monitored during session;Repositioned    Home Living Family/patient expects to be discharged to:: Private residence Living Arrangements: Children (daughter)   Type of Home: House       Home Layout: Able to live on main level with bedroom/bathroom Home Equipment: Walker - 4 wheels;Cane - single point      Prior Function Level of Independence: Independent with assistive device(s)         Comments: typically uses cane for household mobility, walker for community     Hand Dominance        Extremity/Trunk Assessment        Lower Extremity Assessment Lower Extremity Assessment: Generalized weakness       Communication   Communication: No difficulties  Cognition Arousal/Alertness: Awake/alert Behavior During Therapy: WFL for tasks assessed/performed Overall Cognitive Status: Within Functional Limits for tasks assessed  General Comments General comments (skin integrity, edema, etc.): pt denies any recent falls, last falls believed to be due to statin use which she is no longer taking and no falls since    Exercises     Assessment/Plan    PT Assessment All further PT needs can be met in the next venue of care  PT Problem List Decreased strength;Decreased mobility;Decreased balance;Decreased knowledge of use of DME;Decreased activity tolerance       PT Treatment Interventions      PT Goals (Current goals can be found in the Care Plan section)  Acute Rehab PT Goals PT Goal Formulation: All  assessment and education complete, DC therapy    Frequency     Barriers to discharge        Co-evaluation               AM-PAC PT "6 Clicks" Mobility  Outcome Measure Help needed turning from your back to your side while in a flat bed without using bedrails?: A Little Help needed moving from lying on your back to sitting on the side of a flat bed without using bedrails?: A Little Help needed moving to and from a bed to a chair (including a wheelchair)?: A Little Help needed standing up from a chair using your arms (e.g., wheelchair or bedside chair)?: A Little Help needed to walk in hospital room?: A Little Help needed climbing 3-5 steps with a railing? : A Little 6 Click Score: 18    End of Session Equipment Utilized During Treatment: Gait belt Activity Tolerance: Patient tolerated treatment well Patient left: in chair;with call bell/phone within reach;with family/visitor present Nurse Communication: Mobility status PT Visit Diagnosis: Difficulty in walking, not elsewhere classified (R26.2);Muscle weakness (generalized) (M62.81)    Time: 2993-7169 PT Time Calculation (min) (ACUTE ONLY): 15 min   Charges:   PT Evaluation $PT Eval Low Complexity: 1 Low     Kati PT, DPT Acute Rehabilitation Services Pager: [phone] Office: [phone] [place] Ram E 07/27/2020, 12:25 PM

## 2020-07-27 NOTE — Progress Notes (Signed)
[facility] for Apixaban Indication: pulmonary embolus  No Known Allergies  Patient Measurements: Height: 5\' 7"  (170.2 cm) Weight: 71 kg (156 lb 8.4 oz) IBW/kg (Calculated) : 61.6 Heparin Dosing Weight: 71 kg  Vital Signs: Temp: 98.3 F (36.8 C) (05/02 0529) Temp Source: Oral (05/02 0529) BP: 119/52 (05/02 0529) Pulse Rate: 72 (05/02 0529)  Labs: Recent Labs    07/24/20 0854 07/24/20 1054 07/24/20 2056 07/25/20 0246 07/25/20 0624 07/25/20 1151 07/25/20 1321 07/25/20 1644 07/26/20 0257 07/27/20 0435  HGB 14.7  --   --  13.9  --   --   --   --  14.0 13.6  HCT 44.4  --   --  42.2  --   --   --   --  42.0 41.1  PLT 231  --   --  237  --   --   --   --  219 223  APTT 23*  --   --   --   --   --   --   --   --   --   LABPROT 13.7  --   --   --   --   --   --   --   --   --   INR 1.0  --   --   --   --   --   --   --   --   --   HEPARINUNFRC  --   --    < >  --    < >  --   --  0.55 0.51 0.43  CREATININE 1.05*  --   --  1.22*  --   --   --   --  1.08* 0.96  TROPONINIHS 107* 215*  --   --   --  128* 134*  --   --   --    < > = values in this interval not displayed.    Estimated Creatinine Clearance: 47 mL/min (by C-G formula based on SCr of 0.96 mg/dL).  Assessment: 51 yoF with PMH HTN, DM, CVA 2017, presenting with SOB and radiating chest pain several hours ago. CTA shows bulky saddle PE   Prior anticoagulation: none PTA, only on Plavix 75 mg/d; LD 4/29 AM  Baseline protime/INR, aptt WNL  Significant events:  Today, 07/27/2020:  Heparin level 0.43 this am - therapeutic with heparin infusing at 950 units/hour  CBC: WNL, SCr 0.96  Plavix d/c 5/1  Transition to Apixaban today  Goal of Therapy: Heparin level 0.3-0.7 units/ml Monitor platelets by anticoagulation protocol: Yes  Plan:  At 10am today, discontinue Heparin infusion  Begin Apixaban 10mg  bid x 7 days, followed by 5mg  bid  Medication counseling by Rx  completed  Minda Ditto PharmD Clinical Pharmacist [place] Please utilize Amion for appropriate phone number to reach the unit pharmacist ([place]) 07/27/2020 7:18 AM

## 2020-07-27 NOTE — Discharge Summary (Signed)
Physician Discharge Summary  DALLYN BRUNN [id number] DOB: 1940/10/16 DOA: 07/24/2020  PCP: Martinique, Jenniffer G, MD  Admit date: 07/24/2020 Discharge date: 07/27/2020  Admitted From: Home Disposition: Home  Recommendations for Outpatient Follow-up:  1. Follow ups as below. 2. Please obtain CBC/BMP/Mag at follow up 3. Please follow up on the following pending results: None  Home Health: PT/OT Equipment/Devices: None  Discharge Condition: Stable CODE STATUS: Full code but no intubation   Follow-up Information    Martinique, Tayley G, MD. Schedule an appointment as soon as possible for a visit in 1 week(s).   Specialty: Family Medicine Contact information: [place] [address] [phone]                Hospital Course: 80 year old F with PMH of CKD-3A, DM-2, CVA, HTN, HLD and depression presenting with chest pressure, shortness of breath and hypoxemia to 88%, and admitted for submassive saddle PE with right heart strain.  Started on IV heparin.  PCCM recommended IV heparin for 48 to 72 hours before transitioning to [place].  TTE with LVEF of 70 to 75%, G1-DD and mildly enlarged RV and mildly reduced RVSF.   Patient received IV heparin for 66 hours.  Respiratory failure resolved.  Dyspnea and chest pressure improved.  She maintained appropriate saturation with ambulation on room air.  Therapy recommended home health.  Eventually transitioned to starter pack Eliquis and discharged on this.  Patient has been advised to avoid NSAID or over-the-counter supplements while taking Eliquis.  Of note, patient was on Plavix for over 20 years for secondary prevention of CVA.  Plavix was discontinued while patient is on Eliquis due to increased risk of bleeding.  See individual problem list below for more on hospital course.  Discharge Diagnoses:  Acute submassive saddle PE with right heart strain-seems unprovoked.  Hemodynamically stable.  TTE as above.  LE [place]  negative.  Received IV heparin for about 66 hours. -Discharged on starter pack Eliquis  Elevated troponin: High-sensitivity trop 107>211> 128> 134> 25> 24.  Likely demand ischemia from PE.  EKG without acute ischemic finding.  TTE reassuring.  AKI/azotemia:  thought to have CKD-3A which seems to be less likely.  AKI resolved. Recent Labs    08/07/19 1114 02/11/20 0905 07/14/20 0917 07/24/20 0854 07/25/20 0246 07/26/20 0257 07/27/20 0435  BUN 18 18 19  25* 28* 23 21  CREATININE 0.96 1.00* 1.03 1.05* 1.22* 1.08* 0.96  -Recheck renal function at follow-up.  Prediabetes: Last A1c 6.2% on 07/14/2020.  Never had A1c above 6.2% per chart review  Remote history of CVA without residual deficits -Reports history of statin intolerance.  Has not been taking pravastatin.  Consider PCSK9 inhibitors -Discontinued Plavix while on Eliquis due to increased risk of bleeding   Essential HTN: Normotensive -Continue home meds  History of depression: Stable -Continue home Wellbutrin and Cymbalta  Generalized weakness/debility -Home health PT/OT  Family communication -Patient's daughter updated at bedside  Body mass index is 24.52 kg/m.            Discharge Exam: Vitals:   07/27/20 0158 07/27/20 0529  BP: (!) 117/53 (!) 119/52  Pulse: 80 72  Resp: 19 18  Temp: 98.1 F (36.7 C) 98.3 F (36.8 C)  SpO2: 93% 93%    GENERAL: No apparent distress.  Nontoxic. HEENT: MMM.  Vision and hearing grossly intact.  NECK: Supple.  No apparent JVD.  RESP: 94% on RA at rest.  No IWOB.  Fair aeration bilaterally.  CVS:  RRR. Heart sounds normal.  ABD/GI/GU: Bowel sounds present. Soft. Non tender.  MSK/EXT:  Moves extremities. No apparent deformity. No edema.  SKIN: no apparent skin lesion or wound NEURO: Awake, alert and oriented appropriately.  No apparent focal neuro deficit. PSYCH: Calm. Normal affect.   Discharge Instructions  Discharge Instructions    Call MD for:  difficulty  breathing, headache or visual disturbances   Complete by: As directed    Call MD for:  extreme fatigue   Complete by: As directed    Call MD for:  persistant dizziness or light-headedness   Complete by: As directed    Call MD for:  severe uncontrolled pain   Complete by: As directed    Diet - low sodium heart healthy   Complete by: As directed    Diet Carb Modified   Complete by: As directed    Discharge instructions   Complete by: As directed    It has been a pleasure taking care of you!  You were hospitalized due to pulmonary embolism (blood clot in your lung).  We have treated you with blood thinner, and discharging you on more blood thinner (Eliquis) to continue taking.  It is very important that you take this medication as prescribed.  As we discussed, we have stopped your Plavix due to increased risk of bleeding if you take it with Eliquis.  We strongly recommend you avoid any over-the-counter pain medication other than plain Tylenol.  We also recommend you avoid taking any over-the-counter supplements as there could be an interaction with your blood thinner.  Please follow-up with your primary care doctor in 1 to 2 weeks or sooner if needed.   Take care,   Increase activity slowly   Complete by: As directed      Allergies as of 07/27/2020   No Known Allergies     Medication List    STOP taking these medications   b complex vitamins capsule   clopidogrel 75 MG tablet Commonly known as: PLAVIX   L-Lysine 500 MG Caps   pravastatin 20 MG tablet Commonly known as: PRAVACHOL   vitamin E 180 MG (400 UNITS) capsule     TAKE these medications   Accu-Chek Aviva Plus test strip Generic drug: glucose blood USE TO TEST BLOOD SUGAR ONCE DAILY   accu-chek softclix lancets Use to test blood sugar once a day.   Accu-Chek Softclix Lancets lancets Use to check blood sugar once daily   acetaminophen 325 MG tablet Commonly known as: TYLENOL Take 650 mg by mouth every 6  (six) hours as needed for mild pain, fever or headache.   allopurinol 100 MG tablet Commonly known as: ZYLOPRIM Take 1 tablet (100 mg total) by mouth 2 (two) times daily.   Alpha-Lipoic Acid 600 MG Tabs Take 600 mg by mouth daily.   amLODipine-olmesartan 5-20 MG tablet Commonly known as: AZOR Take 1 tablet by mouth daily.   Apixaban Starter Pack (10mg  and 5mg ) Commonly known as: ELIQUIS STARTER PACK Take as directed on package: start with two-5mg  tablets twice daily for 7 days. On day 8, switch to one-5mg  tablet twice daily.   apixaban 5 MG Tabs tablet Commonly known as: ELIQUIS Take 1 tablet (5 mg total) by mouth 2 (two) times daily. Start after completing the starter pack Start taking on: August 26, 2020   buPROPion 75 MG tablet Commonly known as: WELLBUTRIN TAKE ONE TABLET BY MOUTH TWICE A DAY   colchicine 0.6 MG tablet TAKE ONE  TABLET BY MOUTH TWICE A DAY   DULoxetine 60 MG capsule Commonly known as: CYMBALTA TAKE ONE CAPSULE BY MOUTH DAILY   Fish Oil 1000 MG Caps Take 1,000 mg by mouth 2 (two) times daily.   multivitamin capsule Take 1 capsule by mouth daily.   pantoprazole 20 MG tablet Commonly known as: PROTONIX TAKE ONE TABLET BY MOUTH DAILY  (NEW DOSE) What changed: See the new instructions.   THERAWORX RELIEF EX Apply 1 application topically daily as needed (foot cramps).       Consultations:  PCCM  Procedures/Studies: 1. Left ventricular ejection fraction, by estimation, is 70 to 75%. The  left ventricle has hyperdynamic function. The left ventricle has no  regional wall motion abnormalities. There is mild left ventricular  hypertrophy. Left ventricular diastolic  parameters are consistent with Grade I diastolic dysfunction (impaired  relaxation).  2. Right ventricular systolic function is mildly reduced. The right  ventricular size is mildly enlarged.  3. The mitral valve is grossly normal. Trivial mitral valve  regurgitation.  4. The  aortic valve is tricuspid. Aortic valve regurgitation is not  visualized.  5. The inferior vena cava is normal in size with greater than 50%  respiratory variability, suggesting right atrial pressure of 3 mmHg.   Comparison(s): No prior Echocardiogram. Findings suggestive of mild right  heart strain the setting of pulmonary embolus.    DG Chest 2 View  Result Date: 07/24/2020 CLINICAL DATA:  Chest pain EXAM: CHEST - 2 VIEW COMPARISON:  07/14/2020 FINDINGS: Heart size and vascularity normal.  Negative for edema or effusion. Vague density overlying the right anterior second rib similar to the prior study. This could be a rib lesion or underlying infiltrate or lung nodule. This is not appreciated on the prior study from 2009. IMPRESSION: Ill-defined density overlying the right anterior second rib unchanged. Possible rib lesion. Possible infiltrate or lung nodule. Recommend CT chest with contrast for further evaluation. Electronically Signed   By: Franchot Gallo M.D.   On: 07/24/2020 09:40   DG Chest 2 View  Result Date: 07/14/2020 CLINICAL DATA:  Persistent cough, 4 days of thoracic back pain EXAM: CHEST - 2 VIEW COMPARISON:  06/12/2007 FINDINGS: Frontal and lateral views of the chest demonstrate mild enlargement of the cardiac silhouette. Diffuse increased interstitial prominence throughout the lungs most suggestive of scarring. No acute airspace disease, effusion, or pneumothorax. Chronic T11 compression fracture with approximately 50% loss of height. There is increased density and asymmetric enlargement of the anterior aspect of the right second rib. Please correlate with clinical symptoms. Follow-up CT scan or bone scan may be useful for further evaluation. IMPRESSION: 1. Diffuse interstitial prominence most consistent with scarring. No acute airspace disease. 2. Increased density and enlargement of the right anterior second rib, nonspecific. Bone scan or CT may be useful for follow-up. These  results will be called to the ordering clinician or representative by the Radiologist Assistant, and communication documented in the PACS or [employer]. Electronically Signed   By: Randa Ngo M.D.   On: 07/14/2020 16:52   CT Angio Chest PE W and/or Wo Contrast  Addendum Date: 07/24/2020   ADDENDUM REPORT: 07/24/2020 11:27 ADDENDUM: Critical Value/emergent results were called by telephone at the time of interpretation on 07/24/2020 at 11:26 am to Dr. Madalyn Rob , who verbally acknowledged these results. Electronically Signed   By: Genevie Ann M.D.   On: 07/24/2020 11:27   Result Date: 07/24/2020 CLINICAL DATA:  80 year old female with progressive, persistent  shortness of breath. EXAM: CT ANGIOGRAPHY CHEST WITH CONTRAST TECHNIQUE: Multidetector CT imaging of the chest was performed using the standard protocol during bolus administration of intravenous contrast. Multiplanar CT image reconstructions and MIPs were obtained to evaluate the vascular anatomy. CONTRAST:  81mL OMNIPAQUE IOHEXOL 350 MG/ML SOLN COMPARISON:  Chest radiographs 0927 hours today. FINDINGS: Cardiovascular: Excellent contrast bolus timing in the pulmonary arterial tree. Positive bilateral pulmonary emboli with saddle embolus on series 5, image 110. Lobar and segmental emboli bilaterally. Superimposed respiratory motion artifact. Abnormal RV/LV ratio > 1.  No pericardial effusion. Calcified aortic atherosclerosis. Mediastinum/Nodes: Numerous calcified mediastinal lymph nodes compatible with prior granulomatous process. No suspicious mediastinal lymphadenopathy. No axillary lymphadenopathy. Lungs/Pleura: Major airways are patent. Low lung volumes with atelectasis. Calcified left lower lobe medial basal segment granuloma. Small posterior right upper lobe pulmonary infarct suspected abutting the major fissure on series 10, image 43. No other confluent pulmonary opacity. No pleural effusion. Upper Abdomen: Negative visible liver,  gallbladder, adrenal glands and bowel. Numerous calcified splenic granulomas. Musculoskeletal: Chronic appearing T11 superior endplate compression. No acute osseous abnormality identified. No suspicious osseous lesion identified. Review of the MIP images confirms the above findings. IMPRESSION: 1. Positive for acute PE with saddle embolus, bulky clot bilaterally, and CT evidence of right heart strain (RV/LV Ratio >1) consistent with at least submassive (intermediate risk) PE. The presence of right heart strain has been associated with an increased risk of morbidity and mortality. Please refer to the "PE Focused" order set in EPIC. 2. Small peripheral right upper lobe pulmonary infarct which corresponds to the earlier chest x-ray density. No pleural effusion. 3. Incidental sequelae of prior granulomatous disease in the chest and abdomen. 4. Aortic Atherosclerosis (ICD10-I70.0). Electronically Signed: By: Genevie Ann M.D. On: 07/24/2020 11:20   ECHOCARDIOGRAM COMPLETE  Result Date: 07/25/2020    ECHOCARDIOGRAM REPORT   Patient Name:   RYDEN KRUSZKA Date of Exam: 07/25/2020 Medical Rec #:  [id number]              Height:       67.0 in Accession #:    [id number]             Weight:       156.5 lb Date of Birth:  [birth year]              BSA:          1.822 m Patient Age:    14 years               BP:           124/54 mmHg Patient Gender: F                      HR:           92 bpm. Exam Location:  Inpatient Procedure: 2D Echo, Cardiac Doppler and Color Doppler Indications:    I26.02 Pulmonary embolus  History:        Patient has no prior history of Echocardiogram examinations.                 Stroke; Risk Factors:Hypertension, Dyslipidemia, Diabetes and                 GERD.  Sonographer:    Jonelle Sidle Dance Referring Phys: [id number] [place]  1. Left ventricular ejection fraction, by estimation, is 70 to 75%. The left ventricle has hyperdynamic function. The left ventricle has no regional wall  motion abnormalities. There is mild left ventricular hypertrophy. Left ventricular diastolic parameters are consistent with Grade I diastolic dysfunction (impaired relaxation).  2. Right ventricular systolic function is mildly reduced. The right ventricular size is mildly enlarged.  3. The mitral valve is grossly normal. Trivial mitral valve regurgitation.  4. The aortic valve is tricuspid. Aortic valve regurgitation is not visualized.  5. The inferior vena cava is normal in size with greater than 50% respiratory variability, suggesting right atrial pressure of 3 mmHg. Comparison(s): No prior Echocardiogram. Findings suggestive of mild right heart strain the setting of pulmonary embolus. FINDINGS  Left Ventricle: Left ventricular ejection fraction, by estimation, is 70 to 75%. The left ventricle has hyperdynamic function. The left ventricle has no regional wall motion abnormalities. The left ventricular internal cavity size was normal in size. There is mild left ventricular hypertrophy. Left ventricular diastolic parameters are consistent with Grade I diastolic dysfunction (impaired relaxation). Normal left ventricular filling pressure. Right Ventricle: The right ventricular size is mildly enlarged. No increase in right ventricular wall thickness. Right ventricular systolic function is mildly reduced. Left Atrium: Left atrial size was normal in size. Right Atrium: Right atrial size was normal in size. Pericardium: There is no evidence of pericardial effusion. Mitral Valve: The mitral valve is grossly normal. Trivial mitral valve regurgitation. Tricuspid Valve: The tricuspid valve is grossly normal. Tricuspid valve regurgitation is trivial. Aortic Valve: The aortic valve is tricuspid. Aortic valve regurgitation is not visualized. Pulmonic Valve: The pulmonic valve was normal in structure. Pulmonic valve regurgitation is not visualized. Aorta: The aortic root and ascending aorta are structurally normal, with no  evidence of dilitation. Venous: The inferior vena cava is normal in size with greater than 50% respiratory variability, suggesting right atrial pressure of 3 mmHg. IAS/Shunts: No atrial level shunt detected by color flow Doppler.  LEFT VENTRICLE PLAX 2D LVIDd:         3.30 cm  Diastology LVIDs:         1.90 cm  LV e' medial:    7.29 cm/s LV PW:         1.10 cm  LV E/e' medial:  7.9 LV IVS:        0.90 cm  LV e' lateral:   8.16 cm/s LVOT diam:     1.90 cm  LV E/e' lateral: 7.1 LV SV:         56 LV SV Index:   30 LVOT Area:     2.84 cm  RIGHT VENTRICLE            IVC RV Basal diam:  2.90 cm    IVC diam: 1.90 cm RV S prime:     7.18 cm/s TAPSE (M-mode): 1.3 cm LEFT ATRIUM             Index       RIGHT ATRIUM           Index LA diam:        3.20 cm 1.76 cm/m  RA Area:     10.30 cm LA Vol (A2C):   20.2 ml 11.09 ml/m RA Volume:   17.60 ml  9.66 ml/m LA Vol (A4C):   13.5 ml 7.41 ml/m LA Biplane Vol: 16.1 ml 8.84 ml/m  AORTIC VALVE LVOT Vmax:   111.00 cm/s LVOT Vmean:  71.900 cm/s LVOT VTI:    0.196 m  AORTA Ao Root diam: 2.90 cm Ao Asc diam:  3.20 cm MITRAL VALVE MV Area (PHT): 2.73 cm  SHUNTS MV Decel Time: 278 msec    Systemic VTI:  0.20 m MV E velocity: 57.70 cm/s  Systemic Diam: 1.90 cm MV A velocity: 88.30 cm/s MV E/A ratio:  0.65 Lyman Bishop MD Electronically signed by Lyman Bishop MD Signature Date/Time: 07/25/2020/12:01:59 PM    Final    VAS [place] LOWER EXTREMITY VENOUS (DVT)  Result Date: 07/25/2020  Lower Venous DVT Study Patient Name:  Alexa Brown  Date of Exam:   07/25/2020 Medical Rec #: [id number]               Accession #:    [id number] Date of Birth: 1940-08-07               Patient Gender: F Patient Age:   80Y Exam Location:  [facility] Procedure:      VAS [place] LOWER EXTREMITY VENOUS (DVT) Referring Phys: [id number] Charlesetta Ivory Shaymus Eveleth --------------------------------------------------------------------------------  Indications: Elevated ddimer.  Comparison Study: no prior Performing  Technologist: Abram Sander RVS  Examination Guidelines: A complete evaluation includes B-mode imaging, spectral Doppler, color Doppler, and power Doppler as needed of all accessible portions of each vessel. Bilateral testing is considered an integral part of a complete examination. Limited examinations for reoccurring indications may be performed as noted. The reflux portion of the exam is performed with the patient in reverse Trendelenburg.  +---------+---------------+---------+-----------+----------+--------------+ RIGHT    CompressibilityPhasicitySpontaneityPropertiesThrombus Aging +---------+---------------+---------+-----------+----------+--------------+ CFV      Full           Yes      Yes                                 +---------+---------------+---------+-----------+----------+--------------+ SFJ      Full                                                        +---------+---------------+---------+-----------+----------+--------------+ FV Prox  Full                                                        +---------+---------------+---------+-----------+----------+--------------+ FV Mid   Full                                                        +---------+---------------+---------+-----------+----------+--------------+ FV DistalFull                                                        +---------+---------------+---------+-----------+----------+--------------+ PFV      Full                                                        +---------+---------------+---------+-----------+----------+--------------+  POP      Full           Yes      Yes                                 +---------+---------------+---------+-----------+----------+--------------+ PTV      Full                                                        +---------+---------------+---------+-----------+----------+--------------+ PERO     Full                                                         +---------+---------------+---------+-----------+----------+--------------+   +---------+---------------+---------+-----------+----------+--------------+ LEFT     CompressibilityPhasicitySpontaneityPropertiesThrombus Aging +---------+---------------+---------+-----------+----------+--------------+ CFV      Full           Yes      Yes                                 +---------+---------------+---------+-----------+----------+--------------+ SFJ      Full                                                        +---------+---------------+---------+-----------+----------+--------------+ FV Prox  Full                                                        +---------+---------------+---------+-----------+----------+--------------+ FV Mid   Full                                                        +---------+---------------+---------+-----------+----------+--------------+ FV DistalFull                                                        +---------+---------------+---------+-----------+----------+--------------+ PFV      Full                                                        +---------+---------------+---------+-----------+----------+--------------+ POP      Full           Yes      Yes                                 +---------+---------------+---------+-----------+----------+--------------+  PTV      Full                                                        +---------+---------------+---------+-----------+----------+--------------+ PERO     Full                                                        +---------+---------------+---------+-----------+----------+--------------+     Summary: BILATERAL: - No evidence of deep vein thrombosis seen in the lower extremities, bilaterally. - No evidence of superficial venous thrombosis in the lower extremities, bilaterally. -No evidence of popliteal cyst, bilaterally.   *See table(s) above  for measurements and observations. Electronically signed by Harold Barban MD on 07/25/2020 at 4:12:33 PM.    Final         The results of significant diagnostics from this hospitalization (including imaging, microbiology, ancillary and laboratory) are listed below for reference.     Microbiology: Recent Results (from the past 240 hour(s))  Resp Panel by RT-PCR (Flu A&B, Covid) Nasopharyngeal Swab     Status: None   Collection Time: 07/24/20  9:40 AM   Specimen: Nasopharyngeal Swab; Nasopharyngeal(NP) swabs in vial transport medium  Result Value Ref Range Status   SARS Coronavirus 2 by RT PCR NEGATIVE NEGATIVE Final    Comment: (NOTE) SARS-CoV-2 target nucleic acids are NOT DETECTED.  The SARS-CoV-2 RNA is generally detectable in upper respiratory specimens during the acute phase of infection. The lowest concentration of SARS-CoV-2 viral copies this assay can detect is 138 copies/mL. A negative result does not preclude SARS-Cov-2 infection and should not be used as the sole basis for treatment or other patient management decisions. A negative result may occur with  improper specimen collection/handling, submission of specimen other than nasopharyngeal swab, presence of viral mutation(s) within the areas targeted by this assay, and inadequate number of viral copies(<138 copies/mL). A negative result must be combined with clinical observations, patient history, and epidemiological information. The expected result is Negative.  Fact Sheet for Patients:  EntrepreneurPulse.com.au  Fact Sheet for Healthcare Providers:  IncredibleEmployment.be  This test is no t yet approved or cleared by the [place] FDA and  has been authorized for detection and/or diagnosis of SARS-CoV-2 by FDA under an Emergency Use Authorization (EUA). This EUA will remain  in effect (meaning this test can be used) for the duration of the COVID-19 declaration under  Section 564(b)(1) of the Act, 21 U.S.C.section 360bbb-3(b)(1), unless the authorization is terminated  or revoked sooner.       Influenza A by PCR NEGATIVE NEGATIVE Final   Influenza B by PCR NEGATIVE NEGATIVE Final    Comment: (NOTE) The Xpert Xpress SARS-CoV-2/FLU/RSV plus assay is intended as an aid in the diagnosis of influenza from Nasopharyngeal swab specimens and should not be used as a sole basis for treatment. Nasal washings and aspirates are unacceptable for Xpert Xpress SARS-CoV-2/FLU/RSV testing.  Fact Sheet for Patients: EntrepreneurPulse.com.au  Fact Sheet for Healthcare Providers: IncredibleEmployment.be  This test is not yet approved or cleared by the [place] FDA and has been authorized for detection and/or diagnosis of SARS-CoV-2 by FDA under an Emergency Use Authorization (  EUA). This EUA will remain in effect (meaning this test can be used) for the duration of the COVID-19 declaration under Section 564(b)(1) of the Act, 21 U.S.C. section 360bbb-3(b)(1), unless the authorization is terminated or revoked.  Performed at [facility], [facility] [address], [place], [place] [address]   MRSA PCR Screening     Status: None   Collection Time: 07/24/20  2:00 PM   Specimen: Nasopharyngeal  Result Value Ref Range Status   MRSA by PCR NEGATIVE NEGATIVE Final    Comment:        The GeneXpert MRSA Assay (FDA approved for NASAL specimens only), is one component of a comprehensive MRSA colonization surveillance program. It is not intended to diagnose MRSA infection nor to guide or monitor treatment for MRSA infections. Performed at [facility], [place] [address], [place], [place] [address]      Labs:  CBC: Recent Labs  Lab 07/24/20 0854 07/25/20 0246 07/26/20 0257 07/27/20 0435  WBC 7.0 7.6 6.4 5.2  NEUTROABS 4.7  --   --   --   HGB 14.7 13.9 14.0 13.6  HCT 44.4 42.2 42.0 41.1  MCV  91.9 93.2 93.1 93.2  PLT 231 237 219 223   BMP &GFR Recent Labs  Lab 07/24/20 0854 07/25/20 0246 07/26/20 0257 07/27/20 0435  NA 144 140 142 141  K 4.4 4.0 4.2 4.2  CL 113* 111 112* 111  CO2 21* 24 23 21*  GLUCOSE 132* 122* 118* 104*  BUN 25* 28* 23 21  CREATININE 1.05* 1.22* 1.08* 0.96  CALCIUM 10.2 9.3 9.4 9.2  MG  --   --  2.2 2.0  PHOS  --   --  3.2 2.9   Estimated Creatinine Clearance: 47 mL/min (by C-G formula based on SCr of 0.96 mg/dL). Liver & Pancreas: Recent Labs  Lab 07/24/20 0854 07/25/20 0246 07/26/20 0257 07/27/20 0435  AST 24 18  --   --   ALT 16 14  --   --   ALKPHOS 53 45  --   --   BILITOT 0.5 0.2*  --   --   PROT 7.5 6.4*  --   --   ALBUMIN 4.2 3.4* 3.6 3.4*   No results for input(s): LIPASE, AMYLASE in the last 168 hours. No results for input(s): AMMONIA in the last 168 hours. Diabetic: No results for input(s): HGBA1C in the last 72 hours. Recent Labs  Lab 07/25/20 0846 07/26/20 0821 07/27/20 0743  GLUCAP 111* 111* 111*   Cardiac Enzymes: No results for input(s): CKTOTAL, CKMB, CKMBINDEX, TROPONINI in the last 168 hours. No results for input(s): PROBNP in the last 8760 hours. Coagulation Profile: Recent Labs  Lab 07/24/20 0854  INR 1.0   Thyroid Function Tests: No results for input(s): TSH, T4TOTAL, FREET4, T3FREE, THYROIDAB in the last 72 hours. Lipid Profile: No results for input(s): CHOL, HDL, LDLCALC, TRIG, CHOLHDL, LDLDIRECT in the last 72 hours. Anemia Panel: No results for input(s): VITAMINB12, FOLATE, FERRITIN, TIBC, IRON, RETICCTPCT in the last 72 hours. Urine analysis: No results found for: COLORURINE, APPEARANCEUR, LABSPEC, PHURINE, GLUCOSEU, HGBUR, BILIRUBINUR, KETONESUR, PROTEINUR, UROBILINOGEN, NITRITE, LEUKOCYTESUR Sepsis Labs: Invalid input(s): PROCALCITONIN, LACTICIDVEN   Time coordinating discharge: 35 minutes  SIGNED:  Mercy Riding, MD  [facility] 07/27/2020, 2:38 PM  If 7PM-7AM, please contact  night-coverage [email or web address]

## 2020-07-28 ENCOUNTER — Telehealth: Payer: Self-pay | Admitting: Family Medicine

## 2020-07-28 DIAGNOSIS — I2692 Saddle embolus of pulmonary artery without acute cor pulmonale: Secondary | ICD-10-CM | POA: Diagnosis not present

## 2020-07-28 DIAGNOSIS — K219 Gastro-esophageal reflux disease without esophagitis: Secondary | ICD-10-CM | POA: Diagnosis not present

## 2020-07-28 DIAGNOSIS — Z79899 Other long term (current) drug therapy: Secondary | ICD-10-CM | POA: Diagnosis not present

## 2020-07-28 DIAGNOSIS — F32A Depression, unspecified: Secondary | ICD-10-CM | POA: Diagnosis not present

## 2020-07-28 DIAGNOSIS — E1122 Type 2 diabetes mellitus with diabetic chronic kidney disease: Secondary | ICD-10-CM | POA: Diagnosis not present

## 2020-07-28 DIAGNOSIS — I129 Hypertensive chronic kidney disease with stage 1 through stage 4 chronic kidney disease, or unspecified chronic kidney disease: Secondary | ICD-10-CM | POA: Diagnosis not present

## 2020-07-28 DIAGNOSIS — Z7901 Long term (current) use of anticoagulants: Secondary | ICD-10-CM | POA: Diagnosis not present

## 2020-07-28 DIAGNOSIS — Z8673 Personal history of transient ischemic attack (TIA), and cerebral infarction without residual deficits: Secondary | ICD-10-CM | POA: Diagnosis not present

## 2020-07-28 DIAGNOSIS — N1831 Chronic kidney disease, stage 3a: Secondary | ICD-10-CM | POA: Diagnosis not present

## 2020-07-28 DIAGNOSIS — R778 Other specified abnormalities of plasma proteins: Secondary | ICD-10-CM | POA: Diagnosis not present

## 2020-07-28 DIAGNOSIS — E785 Hyperlipidemia, unspecified: Secondary | ICD-10-CM | POA: Diagnosis not present

## 2020-07-28 NOTE — Telephone Encounter (Signed)
Alexa Brown is calling and is requesting verbal orders for physical therapy wit the frequency of 1 week 1 and 2 week 3, please advise. CB is [phone]

## 2020-07-28 NOTE — Telephone Encounter (Signed)
I left the verbal on Kelly's voicemail.  

## 2020-07-28 NOTE — Telephone Encounter (Signed)
It can be cancelled for now. We can bring her for hospital follow up. Thanks, BJ

## 2020-07-28 NOTE — Telephone Encounter (Signed)
I called and spoke with patient's daughter; we canceled the CT for tomorrow and scheduled a hospital follow up visit for Thursday at 11am.

## 2020-07-29 ENCOUNTER — Ambulatory Visit (INDEPENDENT_AMBULATORY_CARE_PROVIDER_SITE_OTHER): Payer: Medicare Other | Admitting: Podiatry

## 2020-07-29 ENCOUNTER — Other Ambulatory Visit: Payer: Medicare Other

## 2020-07-29 ENCOUNTER — Other Ambulatory Visit: Payer: Self-pay

## 2020-07-29 ENCOUNTER — Encounter: Payer: Self-pay | Admitting: Podiatry

## 2020-07-29 DIAGNOSIS — B351 Tinea unguium: Secondary | ICD-10-CM | POA: Diagnosis not present

## 2020-07-29 DIAGNOSIS — Z79899 Other long term (current) drug therapy: Secondary | ICD-10-CM | POA: Diagnosis not present

## 2020-07-29 DIAGNOSIS — I2692 Saddle embolus of pulmonary artery without acute cor pulmonale: Secondary | ICD-10-CM | POA: Diagnosis not present

## 2020-07-29 DIAGNOSIS — M79674 Pain in right toe(s): Secondary | ICD-10-CM

## 2020-07-29 DIAGNOSIS — M79675 Pain in left toe(s): Secondary | ICD-10-CM

## 2020-07-29 DIAGNOSIS — K219 Gastro-esophageal reflux disease without esophagitis: Secondary | ICD-10-CM | POA: Diagnosis not present

## 2020-07-29 DIAGNOSIS — E1122 Type 2 diabetes mellitus with diabetic chronic kidney disease: Secondary | ICD-10-CM | POA: Diagnosis not present

## 2020-07-29 DIAGNOSIS — E11610 Type 2 diabetes mellitus with diabetic neuropathic arthropathy: Secondary | ICD-10-CM | POA: Diagnosis not present

## 2020-07-29 DIAGNOSIS — Z8673 Personal history of transient ischemic attack (TIA), and cerebral infarction without residual deficits: Secondary | ICD-10-CM | POA: Diagnosis not present

## 2020-07-29 DIAGNOSIS — I129 Hypertensive chronic kidney disease with stage 1 through stage 4 chronic kidney disease, or unspecified chronic kidney disease: Secondary | ICD-10-CM | POA: Diagnosis not present

## 2020-07-29 DIAGNOSIS — Z7901 Long term (current) use of anticoagulants: Secondary | ICD-10-CM | POA: Diagnosis not present

## 2020-07-29 DIAGNOSIS — N1831 Chronic kidney disease, stage 3a: Secondary | ICD-10-CM | POA: Diagnosis not present

## 2020-07-29 DIAGNOSIS — F32A Depression, unspecified: Secondary | ICD-10-CM | POA: Diagnosis not present

## 2020-07-29 DIAGNOSIS — E785 Hyperlipidemia, unspecified: Secondary | ICD-10-CM | POA: Diagnosis not present

## 2020-07-29 DIAGNOSIS — R778 Other specified abnormalities of plasma proteins: Secondary | ICD-10-CM | POA: Diagnosis not present

## 2020-07-30 ENCOUNTER — Ambulatory Visit (INDEPENDENT_AMBULATORY_CARE_PROVIDER_SITE_OTHER): Payer: Medicare Other | Admitting: Family Medicine

## 2020-07-30 ENCOUNTER — Encounter: Payer: Self-pay | Admitting: Family Medicine

## 2020-07-30 VITALS — BP 106/62 | HR 81 | Temp 97.7°F | Resp 16 | Ht 67.0 in | Wt 156.8 lb

## 2020-07-30 DIAGNOSIS — I2692 Saddle embolus of pulmonary artery without acute cor pulmonale: Secondary | ICD-10-CM | POA: Diagnosis not present

## 2020-07-30 DIAGNOSIS — N1831 Chronic kidney disease, stage 3a: Secondary | ICD-10-CM

## 2020-07-30 DIAGNOSIS — I5030 Unspecified diastolic (congestive) heart failure: Secondary | ICD-10-CM | POA: Insufficient documentation

## 2020-07-30 DIAGNOSIS — E1149 Type 2 diabetes mellitus with other diabetic neurological complication: Secondary | ICD-10-CM

## 2020-07-30 DIAGNOSIS — I674 Hypertensive encephalopathy: Secondary | ICD-10-CM | POA: Diagnosis not present

## 2020-07-30 DIAGNOSIS — I5032 Chronic diastolic (congestive) heart failure: Secondary | ICD-10-CM

## 2020-07-30 LAB — BASIC METABOLIC PANEL
BUN: 26 mg/dL — ABNORMAL HIGH (ref 6–23)
CO2: 24 mEq/L (ref 19–32)
Calcium: 10.5 mg/dL (ref 8.4–10.5)
Chloride: 105 mEq/L (ref 96–112)
Creatinine, Ser: 1.07 mg/dL (ref 0.40–1.20)
GFR: 49.89 mL/min — ABNORMAL LOW (ref >60.00)
Glucose, Bld: 107 mg/dL — ABNORMAL HIGH (ref 70–99)
Potassium: 4.6 mEq/L (ref 3.5–5.1)
Sodium: 137 mEq/L (ref 135–145)

## 2020-07-30 LAB — CBC
HCT: 42.8 % (ref 36.0–46.0)
Hemoglobin: 14.4 g/dL (ref 12.0–15.0)
MCHC: 33.7 g/dL (ref 30.0–36.0)
MCV: 90.2 fl (ref 78.0–100.0)
Platelets: 253 10*3/uL (ref 150.0–400.0)
RBC: 4.74 Mil/uL (ref 3.87–5.11)
RDW: 13.3 % (ref 11.5–15.5)
WBC: 5.3 10*3/uL (ref 4.0–10.5)

## 2020-07-30 LAB — MAGNESIUM: Magnesium: 2.1 mg/dL (ref 1.5–2.5)

## 2020-07-30 MED ORDER — OLMESARTAN MEDOXOMIL 20 MG PO TABS
20.0000 mg | ORAL_TABLET | Freq: Every day | ORAL | 0 refills | Status: DC
Start: 2020-07-30 — End: 2020-08-25

## 2020-07-30 MED ORDER — AMLODIPINE BESYLATE 2.5 MG PO TABS: 2.5000 mg | ORAL_TABLET | Freq: Every day | ORAL | 1 refills | Status: DC

## 2020-07-30 NOTE — Patient Instructions (Addendum)
A few things to remember from today's visit:  Stage 3a chronic kidney disease ([place]) - Plan: CBC, Basic metabolic panel, Magnesium  Acute saddle pulmonary embolism, unspecified whether acute cor pulmonale present ([place]) - Plan: CBC  Systolic hypertension with cerebrovascular disease  If you need refills please call your pharmacy. Do not use My Chart to request refills or for acute issues that need immediate attention.   Amlodipine decreased from 5 mg to 2.5 mg daily and Olmesartan continue 20 mg daily. Take Amlodipine at bedtime. Continue monitoring blood pressure.  No changes in rest of medications.  Please be sure medication list is accurate. If a new problem present, please set up appointment sooner than planned today.

## 2020-07-30 NOTE — Assessment & Plan Note (Signed)
We discussed echo results. Euvolemic. Continue low salt diet and Olmesartan.

## 2020-07-30 NOTE — Progress Notes (Signed)
HPI: [name] is a 80 y.o. female, who is here today with her daughter to follow on recent hospitalization. She was last seen on 07/14/20.  She was admitted on 07/24/20 and discharged on 07/27/20. She presented to the ER c/o CP and SOB associated with hypoxemia, 88% at RA. Chest CTA reveled submassive saddle PE with right heart strain. TTE 07/25/20 LVEF 13-24%, grade I diastolic dysfunction.RV EF mildly reduced and ventricular size mildly enlarged. Negative for orthopnea and PND.  Plavix was discontinued. Eliquis 5 mg bid started. Appetite is "normal." She is using her cane. PT and OT evaluation have been done and planning on starting next week.  Her daughter has some questions about Dx of CKD. CKD III: Since 2017 she has had e GFR mid 50's. 12/2015 e GFR 53, 05/2016 57,12/2017 52. Cr 1.0-1.2. She has not noted decreased urine output, gross hematuria,or foam in urine.  Lab Results  Component Value Date   CREATININE 0.96 07/27/2020   BUN 21 07/27/2020   NA 141 07/27/2020   K 4.2 07/27/2020   CL 111 07/27/2020   CO2 21 (L) 07/27/2020   Lab Results  Component Value Date   WBC 5.2 07/27/2020   HGB 13.6 07/27/2020   HCT 41.1 07/27/2020   MCV 93.2 07/27/2020   PLT 223 07/27/2020   DM II: She is on non pharmacologic treatment.  Lab Results  Component Value Date   HGBA1C 6.2 07/14/2020  Negative for polydipsia,polyuria, or polyphagia.  Last visit benazepril was discontinued because persistent cough, Olmesartan 20 mg was added. She is on Amlodipine 5 mg (Amlodipine-Olmesartan 5-20 mg). She felt some chest pressure and fatigue yesterday, today she is feeling better.  Negative for fever,headache,palpitations,SOB,wheezing,or diaphoresis.  BP at home running on lower 100's/60's. Cough has greatly improved.  Review of Systems  Constitutional: Positive for activity change and fatigue. Negative for appetite change, fever and unexpected weight change.  HENT:  Negative for mouth sores, nosebleeds and sore throat.   Eyes: Negative for redness and visual disturbance.  Gastrointestinal: Negative for abdominal pain, nausea and vomiting.       Negative for changes in bowel habits.  Genitourinary: Negative for decreased urine volume, dysuria and hematuria.  Musculoskeletal: Positive for gait problem.  Neurological: Negative for syncope, facial asymmetry and weakness.  Rest see pertinent positives and negatives per HPI.  Current Outpatient Medications on File Prior to Visit  Medication Sig Dispense Refill  . ACCU-CHEK AVIVA PLUS test strip USE TO TEST BLOOD SUGAR ONCE DAILY 100 strip 1  . Accu-Chek Softclix Lancets lancets Use to check blood sugar once daily 100 each 2  . acetaminophen (TYLENOL) 325 MG tablet Take 650 mg by mouth every 6 (six) hours as needed for mild pain, fever or headache.    . allopurinol (ZYLOPRIM) 100 MG tablet Take 1 tablet (100 mg total) by mouth 2 (two) times daily. 180 tablet 3  . Alpha-Lipoic Acid 600 MG TABS Take 600 mg by mouth daily.    Derrill Memo ON 08/26/2020] apixaban (ELIQUIS) 5 MG TABS tablet Take 1 tablet (5 mg total) by mouth 2 (two) times daily. Start after completing the starter pack 180 tablet 1  . APIXABAN (ELIQUIS) VTE STARTER PACK (10MG  AND 5MG ) Take as directed on package: start with two-5mg  tablets twice daily for 7 days. On day 8, switch to one-5mg  tablet twice daily. 1 each 0  . buPROPion (WELLBUTRIN) 75 MG tablet TAKE ONE TABLET BY MOUTH TWICE A DAY (Patient taking  differently: Take 75 mg by mouth 2 (two) times daily.) 180 tablet 1  . colchicine 0.6 MG tablet TAKE ONE TABLET BY MOUTH TWICE A DAY (Patient taking differently: Take 0.6 mg by mouth 2 (two) times daily.) 30 tablet 0  . DULoxetine (CYMBALTA) 60 MG capsule TAKE ONE CAPSULE BY MOUTH DAILY (Patient taking differently: Take 60 mg by mouth daily.) 90 capsule 2  . Homeopathic Products [facility] RELIEF EX) Apply 1 application topically daily as needed (foot  cramps).    Demetra Shiner Devices (ACCU-CHEK SOFTCLIX) lancets Use to test blood sugar once a day. 100 each 2  . Multiple Vitamin (MULTIVITAMIN) capsule Take 1 capsule by mouth daily.     . Omega-3 Fatty Acids (FISH OIL) 1000 MG CAPS Take 1,000 mg by mouth 2 (two) times daily.    . pantoprazole (PROTONIX) 20 MG tablet TAKE ONE TABLET BY MOUTH DAILY  (NEW DOSE) (Patient taking differently: Take 20 mg by mouth every other day.) 90 tablet 2   No current facility-administered medications on file prior to visit.   Past Medical History:  Diagnosis Date  . Chicken pox   . Depression   . Diabetes mellitus without complication (HCC)   . GERD (gastroesophageal reflux disease)   . Hyperlipidemia   . Hypertension   . Stroke (HCC)   . UTI (urinary tract infection)    No Known Allergies  Social History   Socioeconomic History  . Marital status: Widowed    Spouse name: Not on file  . Number of children: Not on file  . Years of education: Not on file  . Highest education level: Not on file  Occupational History  . Occupation: N/A  Tobacco Use  . Smoking status: Never Smoker  . Smokeless tobacco: Never Used  Vaping Use  . Vaping Use: Never used  Substance and Sexual Activity  . Alcohol use: Yes    Comment: occassional wine  . Drug use: No  . Sexual activity: Not Currently  Other Topics Concern  . Not on file  Social History Narrative   Pt lives in single story home with her daughter   Has 2 children   Some college education   Retired Copywriter, advertising for [employer]   Social Determinants of Health   Financial Resource Strain: Low Risk   . Difficulty of Paying Living Expenses: Not hard at all  Food Insecurity: No Food Insecurity  . Worried About Programme researcher, broadcasting/film/video in the Last Year: Never true  . Ran Out of Food in the Last Year: Never true  Transportation Needs: No Transportation Needs  . Lack of Transportation (Medical): No  . Lack of Transportation (Non-Medical): No   Physical Activity: Insufficiently Active  . Days of Exercise per Week: 2 days  . Minutes of Exercise per Session: 30 min  Stress: No Stress Concern Present  . Feeling of Stress : Not at all  Social Connections: Moderately Integrated  . Frequency of Communication with Friends and Family: More than three times a week  . Frequency of Social Gatherings with Friends and Family: Once a week  . Attends Religious Services: More than 4 times per year  . Active Member of Clubs or Organizations: Yes  . Attends Banker Meetings: 1 to 4 times per year  . Marital Status: Widowed   Vitals:   07/30/20 1105  BP: 106/62  Pulse: 81  Resp: 16  Temp: 97.7 F (36.5 C)  SpO2: 95%   Body mass index is  24.56 kg/m.  Physical Exam Vitals and nursing note reviewed.  Constitutional:      General: She is not in acute distress.    Appearance: She is well-developed.  HENT:     Head: Normocephalic and atraumatic.     Mouth/Throat:     Mouth: Mucous membranes are moist.  Eyes:     Conjunctiva/sclera: Conjunctivae normal.  Cardiovascular:     Rate and Rhythm: Normal rate and regular rhythm.     Heart sounds: No murmur heard.     Comments: DP pulses present. Pulmonary:     Effort: Pulmonary effort is normal. No respiratory distress.     Breath sounds: Normal breath sounds.  Abdominal:     Palpations: Abdomen is soft. There is no hepatomegaly or mass.     Tenderness: There is no abdominal tenderness.  Lymphadenopathy:     Cervical: No cervical adenopathy.  Skin:    General: Skin is warm.     Findings: No erythema or rash.  Neurological:     Mental Status: She is alert and oriented to person, place, and time.     Cranial Nerves: No cranial nerve deficit.     Comments: Unstable gait assisted with a cane.  Psychiatric:     Comments: Well groomed, good eye contact.   ASSESSMENT AND PLAN:  [name] was seen today for follow-up.  Diagnoses and all orders for this visit:  Orders  Placed This Encounter  Procedures  . CBC  . Basic metabolic panel  . Magnesium   Lab Results  Component Value Date   WBC 5.3 07/30/2020   HGB 14.4 07/30/2020   HCT 42.8 07/30/2020   MCV 90.2 07/30/2020   PLT 253.0 07/30/2020   Lab Results  Component Value Date   CREATININE 1.07 07/30/2020   BUN 26 (H) 07/30/2020   NA 137 07/30/2020   K 4.6 07/30/2020   CL 105 07/30/2020   CO2 24 07/30/2020    CKD (chronic kidney disease), stage III (HCC) We discussed Dx,prognosis,and treatment. Long hx of DM II and HTN. We reviewed past renal functions, problem has been stable. We discussed the importance of adequately controlled glucose and BP. Continue Olmesartan,low salt diet,and adequate hydration.  DM (diabetes mellitus), type 2 with neurological complications ([place]) Problem is well controlled. Continue non pharmacologic treatment. Continue appropriate foot care and periodic eye exam.  Systolic hypertension with cerebrovascular disease BP on lower normal range. Amlodipine decreased from 5 mg to 2.5 mg at bedtime.  Continue Olmesartan 20 mg daily. Continue low salt diet. Continue monitoring BP regularly.  (HFpEF) heart failure with preserved ejection fraction ([place]) We discussed echo results. Euvolemic. Continue low salt diet and Olmesartan.   Acute pulmonary embolism (HCC) We reviewed current recommendations, no prior hx of thrombotic events like DVT or PE but because extension + no clear etiology recommend lifetime anticoagulation. Side effects of Eliquis discussed, continue 5 mg bid. Clearly instructed about warning signs.   Spent 43 minutes with pt.  During this time history was obtained and documented, examination was performed, prior labs/imaging reviewed, and assessment/plan discussed.  Return in about 4 weeks (around 08/27/2020) for HTN.  Velna G. Martinique, MD  [facility]. [place] office.   A few things to remember from today's visit:  Stage 3a  chronic kidney disease ([place]) - Plan: CBC, Basic metabolic panel, Magnesium  Acute saddle pulmonary embolism, unspecified whether acute cor pulmonale present ([place]) - Plan: CBC  Systolic hypertension with cerebrovascular disease  If you need  refills please call your pharmacy. Do not use My Chart to request refills or for acute issues that need immediate attention.   Amlodipine decreased from 5 mg to 2.5 mg daily and Olmesartan continue 20 mg daily. Take Amlodipine at bedtime. Continue monitoring blood pressure.  No changes in rest of medications.  Please be sure medication list is accurate. If a new problem present, please set up appointment sooner than planned today.

## 2020-07-30 NOTE — Assessment & Plan Note (Signed)
Problem is well controlled. Continue non pharmacologic treatment. Continue appropriate foot care and periodic eye exam.

## 2020-07-30 NOTE — Assessment & Plan Note (Signed)
BP on lower normal range. Amlodipine decreased from 5 mg to 2.5 mg at bedtime.  Continue Olmesartan 20 mg daily. Continue low salt diet. Continue monitoring BP regularly.

## 2020-07-30 NOTE — Assessment & Plan Note (Signed)
We reviewed current recommendations, no prior hx of thrombotic events like DVT or PE but because extension + no clear etiology recommend lifetime anticoagulation. Side effects of Eliquis discussed, continue 5 mg bid. Clearly instructed about warning signs.

## 2020-07-30 NOTE — Assessment & Plan Note (Addendum)
We discussed Dx,prognosis,and treatment. Long hx of DM II and HTN. We reviewed past renal functions, problem has been stable. We discussed the importance of adequately controlled glucose and BP. Continue Olmesartan,low salt diet,and adequate hydration.

## 2020-08-03 DIAGNOSIS — K219 Gastro-esophageal reflux disease without esophagitis: Secondary | ICD-10-CM | POA: Diagnosis not present

## 2020-08-03 DIAGNOSIS — Z79899 Other long term (current) drug therapy: Secondary | ICD-10-CM | POA: Diagnosis not present

## 2020-08-03 DIAGNOSIS — F32A Depression, unspecified: Secondary | ICD-10-CM | POA: Diagnosis not present

## 2020-08-03 DIAGNOSIS — E785 Hyperlipidemia, unspecified: Secondary | ICD-10-CM | POA: Diagnosis not present

## 2020-08-03 DIAGNOSIS — I2692 Saddle embolus of pulmonary artery without acute cor pulmonale: Secondary | ICD-10-CM | POA: Diagnosis not present

## 2020-08-03 DIAGNOSIS — N1831 Chronic kidney disease, stage 3a: Secondary | ICD-10-CM | POA: Diagnosis not present

## 2020-08-03 DIAGNOSIS — Z7901 Long term (current) use of anticoagulants: Secondary | ICD-10-CM | POA: Diagnosis not present

## 2020-08-03 DIAGNOSIS — Z8673 Personal history of transient ischemic attack (TIA), and cerebral infarction without residual deficits: Secondary | ICD-10-CM | POA: Diagnosis not present

## 2020-08-03 DIAGNOSIS — I129 Hypertensive chronic kidney disease with stage 1 through stage 4 chronic kidney disease, or unspecified chronic kidney disease: Secondary | ICD-10-CM | POA: Diagnosis not present

## 2020-08-03 DIAGNOSIS — R778 Other specified abnormalities of plasma proteins: Secondary | ICD-10-CM | POA: Diagnosis not present

## 2020-08-03 DIAGNOSIS — E1122 Type 2 diabetes mellitus with diabetic chronic kidney disease: Secondary | ICD-10-CM | POA: Diagnosis not present

## 2020-08-04 ENCOUNTER — Telehealth: Payer: Self-pay | Admitting: Pharmacist

## 2020-08-04 DIAGNOSIS — Z7901 Long term (current) use of anticoagulants: Secondary | ICD-10-CM | POA: Diagnosis not present

## 2020-08-04 DIAGNOSIS — E785 Hyperlipidemia, unspecified: Secondary | ICD-10-CM | POA: Diagnosis not present

## 2020-08-04 DIAGNOSIS — N1831 Chronic kidney disease, stage 3a: Secondary | ICD-10-CM | POA: Diagnosis not present

## 2020-08-04 DIAGNOSIS — E1122 Type 2 diabetes mellitus with diabetic chronic kidney disease: Secondary | ICD-10-CM | POA: Diagnosis not present

## 2020-08-04 DIAGNOSIS — K219 Gastro-esophageal reflux disease without esophagitis: Secondary | ICD-10-CM | POA: Diagnosis not present

## 2020-08-04 DIAGNOSIS — R778 Other specified abnormalities of plasma proteins: Secondary | ICD-10-CM | POA: Diagnosis not present

## 2020-08-04 DIAGNOSIS — I2692 Saddle embolus of pulmonary artery without acute cor pulmonale: Secondary | ICD-10-CM | POA: Diagnosis not present

## 2020-08-04 DIAGNOSIS — Z8673 Personal history of transient ischemic attack (TIA), and cerebral infarction without residual deficits: Secondary | ICD-10-CM | POA: Diagnosis not present

## 2020-08-04 DIAGNOSIS — I129 Hypertensive chronic kidney disease with stage 1 through stage 4 chronic kidney disease, or unspecified chronic kidney disease: Secondary | ICD-10-CM | POA: Diagnosis not present

## 2020-08-04 DIAGNOSIS — F32A Depression, unspecified: Secondary | ICD-10-CM | POA: Diagnosis not present

## 2020-08-04 DIAGNOSIS — Z79899 Other long term (current) drug therapy: Secondary | ICD-10-CM | POA: Diagnosis not present

## 2020-08-04 NOTE — Chronic Care Management (AMB) (Signed)
Chronic Care Management Pharmacy Assistant   Name: Alexa Brown  MRN: [id number] DOB: Apr 30, 1940  Reason for Encounter: Disease State   Conditions to be addressed/monitored: HTN  Recent office visits:  . 05.05.2022 Martinique, Daje G, MD (PCP) patient present for hospital follow up o Medications change o Amlodipine Besylate 2.5 mg Oral Daily o Olmesartan Medoxomil 20 mg Oral Daily o Amlodipine-Olmesartan 5-20 MG 1 tablet Oral Daily-discontinued  . 04.19.2022 Martinique, Karima G, MD (PCP) patient present for back and shoulder period o Medication change o Amlodipine-Olmesartan 5-20 MG 1 tablet Oral Daily o Amlodipine Besy-Benazepril HCl 5-20 MG 1 capsule Oral Daily- Discontinued   Recent consult visits:  None  Hospital visits:  Medication Reconciliation was completed by comparing discharge summary, patient's EMR and Pharmacy list, and upon discussion with patient.  Admitted to the hospital on 04.29.2022 due to Acute Pulmonary embolism . Discharge date was 05.02.2022. Discharged from [place]?Medications Started at [facility] Discharge:?? -started the following medication due to acute pulmonary embolism . Apixaban Starter Pack (10mg  and 5mg ) (ELIQUIS STARTER PACK)  Medication Changes at Hospital Discharge: -None change  Medications Discontinued at Hospital Discharge: -Stopped following medication due to acute pulmonary embolism . b complex vitamins capsule . Clopidogrel 75 MG tablet (PLAVIX) . L-Lysine 500 MG Caps . Pravastatin 20 MG tablet (PRAVACHOL) . Vitamin E 180 MG (400 UNITS) capsule  Medications that remain the same after Hospital Discharge:??  -All other medications will remain the same.    Medications: Outpatient Encounter Medications as of 08/04/2020  Medication Sig  . ACCU-CHEK AVIVA PLUS test strip USE TO TEST BLOOD SUGAR ONCE DAILY  . Accu-Chek Softclix Lancets lancets Use to check blood sugar once daily  . acetaminophen  (TYLENOL) 325 MG tablet Take 650 mg by mouth every 6 (six) hours as needed for mild pain, fever or headache.  . allopurinol (ZYLOPRIM) 100 MG tablet Take 1 tablet (100 mg total) by mouth 2 (two) times daily.  . Alpha-Lipoic Acid 600 MG TABS Take 600 mg by mouth daily.  Marland Kitchen amLODipine (NORVASC) 2.5 MG tablet Take 1 tablet (2.5 mg total) by mouth daily.  Derrill Memo ON 08/26/2020] apixaban (ELIQUIS) 5 MG TABS tablet Take 1 tablet (5 mg total) by mouth 2 (two) times daily. Start after completing the starter pack  . APIXABAN (ELIQUIS) VTE STARTER PACK (10MG  AND 5MG ) Take as directed on package: start with two-5mg  tablets twice daily for 7 days. On day 8, switch to one-5mg  tablet twice daily.  Marland Kitchen buPROPion (WELLBUTRIN) 75 MG tablet TAKE ONE TABLET BY MOUTH TWICE A DAY (Patient taking differently: Take 75 mg by mouth 2 (two) times daily.)  . colchicine 0.6 MG tablet TAKE ONE TABLET BY MOUTH TWICE A DAY (Patient taking differently: Take 0.6 mg by mouth 2 (two) times daily.)  . DULoxetine (CYMBALTA) 60 MG capsule TAKE ONE CAPSULE BY MOUTH DAILY (Patient taking differently: Take 60 mg by mouth daily.)  . Homeopathic Products [facility] RELIEF EX) Apply 1 application topically daily as needed (foot cramps).  Elmore Guise Devices (ACCU-CHEK SOFTCLIX) lancets Use to test blood sugar once a day.  . Multiple Vitamin (MULTIVITAMIN) capsule Take 1 capsule by mouth daily.   Marland Kitchen olmesartan (BENICAR) 20 MG tablet Take 1 tablet (20 mg total) by mouth daily.  . Omega-3 Fatty Acids (FISH OIL) 1000 MG CAPS Take 1,000 mg by mouth 2 (two) times daily.  . pantoprazole (PROTONIX) 20 MG tablet TAKE ONE TABLET BY MOUTH  DAILY  (NEW DOSE) (Patient taking differently: Take 20 mg by mouth every other day.)   No facility-administered encounter medications on file as of 08/04/2020.    Reviewed chart prior to disease state call. Spoke with patient regarding BP  Recent Office Vitals: BP Readings from Last 3 Encounters:  07/30/20 106/62   07/27/20 (!) 119/52  07/14/20 122/74   Pulse Readings from Last 3 Encounters:  07/30/20 81  07/27/20 72  07/14/20 98    Wt Readings from Last 3 Encounters:  07/30/20 156 lb 12.8 oz (71.1 kg)  07/24/20 156 lb 8.4 oz (71 kg)  07/14/20 160 lb 9.6 oz (72.8 kg)     Kidney Function Lab Results  Component Value Date/Time   CREATININE 1.07 07/30/2020 11:58 AM   CREATININE 0.96 07/27/2020 04:35 AM   CREATININE 1.00 (H) 02/11/2020 09:05 AM   CREATININE 0.92 07/21/2017 04:02 PM   GFR 49.89 (L) 07/30/2020 11:58 AM   GFRNONAA >60 07/27/2020 04:35 AM   GFRNONAA 54 (L) 02/11/2020 09:05 AM   GFRAA 63 02/11/2020 09:05 AM    BMP Latest Ref Rng & Units 07/30/2020 07/27/2020 07/26/2020  Glucose 70 - 99 mg/dL 107(H) 104(H) 118(H)  BUN 6 - 23 mg/dL 26(H) 21 23  Creatinine 0.40 - 1.20 mg/dL 1.07 0.96 1.08(H)  BUN/Creat Ratio 6 - 22 (calc) - - -  Sodium 135 - 145 mEq/L 137 141 142  Potassium 3.5 - 5.1 mEq/L 4.6 4.2 4.2  Chloride 96 - 112 mEq/L 105 111 112(H)  CO2 19 - 32 mEq/L 24 21(L) 23  Calcium 8.4 - 10.5 mg/dL 10.5 9.2 9.4   . Current antihypertensive regimen:  o Amlodipine Besylate 2.5 mg Oral Daily o Olmesartan Medoxomil 20 mg Oral Daily . How often are you checking your Blood Pressure? 1-2x per week . Current home BP readings:  o 05.07 106/64 o 05.09 110/60 . What recent interventions/DTPs have been made by any provider to improve Blood Pressure control since last CPP Visit:  o Amlodipine-Olmesartan 5-20 MG 1 tablet Oral Daily-discontinued  o Amlodipine Besy-Benazepril HCl 5-20 MG 1 capsule Oral Daily- Discontinued  . Any recent hospitalizations or ED visits since last visit with CPP? Yes . What diet changes have been made to improve Blood Pressure Control?  o None change . What exercise is being done to improve your Blood Pressure Control?  o Physical therapy wit the frequency of 1 week 1 and 2 week 3  Adherence Review: Is the patient currently on ACE/ARB medication? Yes Does the  patient have >5 day gap between last estimated fill dates? No   Star Rating Drugs:   Maia Breslow, [place] [phone]

## 2020-08-05 ENCOUNTER — Other Ambulatory Visit: Payer: Self-pay | Admitting: Family Medicine

## 2020-08-05 DIAGNOSIS — K219 Gastro-esophageal reflux disease without esophagitis: Secondary | ICD-10-CM

## 2020-08-06 DIAGNOSIS — E1122 Type 2 diabetes mellitus with diabetic chronic kidney disease: Secondary | ICD-10-CM | POA: Diagnosis not present

## 2020-08-06 DIAGNOSIS — F32A Depression, unspecified: Secondary | ICD-10-CM | POA: Diagnosis not present

## 2020-08-06 DIAGNOSIS — I129 Hypertensive chronic kidney disease with stage 1 through stage 4 chronic kidney disease, or unspecified chronic kidney disease: Secondary | ICD-10-CM | POA: Diagnosis not present

## 2020-08-06 DIAGNOSIS — Z7901 Long term (current) use of anticoagulants: Secondary | ICD-10-CM | POA: Diagnosis not present

## 2020-08-06 DIAGNOSIS — Z79899 Other long term (current) drug therapy: Secondary | ICD-10-CM | POA: Diagnosis not present

## 2020-08-06 DIAGNOSIS — E785 Hyperlipidemia, unspecified: Secondary | ICD-10-CM | POA: Diagnosis not present

## 2020-08-06 DIAGNOSIS — K219 Gastro-esophageal reflux disease without esophagitis: Secondary | ICD-10-CM | POA: Diagnosis not present

## 2020-08-06 DIAGNOSIS — Z8673 Personal history of transient ischemic attack (TIA), and cerebral infarction without residual deficits: Secondary | ICD-10-CM | POA: Diagnosis not present

## 2020-08-06 DIAGNOSIS — I2692 Saddle embolus of pulmonary artery without acute cor pulmonale: Secondary | ICD-10-CM | POA: Diagnosis not present

## 2020-08-06 DIAGNOSIS — N1831 Chronic kidney disease, stage 3a: Secondary | ICD-10-CM | POA: Diagnosis not present

## 2020-08-06 DIAGNOSIS — R778 Other specified abnormalities of plasma proteins: Secondary | ICD-10-CM | POA: Diagnosis not present

## 2020-08-06 NOTE — Progress Notes (Signed)
  Subjective:  Patient ID: Alexa Brown, female    DOB: Apr 01, 1940,  MRN: [id number]  Alexa Brown presents to clinic today for preventative diabetic foot care and painful thick toenails that are difficult to trim. Pain interferes with ambulation. Aggravating factors include wearing enclosed shoe gear. Pain is relieved with periodic professional debridement.   Blood glucose was 111 mg/dl this morning.   PCP is Dr. Trini Martinique and last visit was 07/30/2020.  No Known Allergies  Review of Systems: Negative except as noted in the HPI. Objective:   Constitutional Alexa Brown is a pleasant 80 y.o. Caucasian female, WD, WN in NAD. AAO x 3.   Vascular Capillary refill time to digits immediate b/l. Palpable pedal pulses b/l LE. Pedal hair sparse. Lower extremity skin temperature gradient within normal limits. No pain with calf compression b/l. No cyanosis or clubbing noted.  Neurologic Normal speech. Oriented to person, place, and time. Protective sensation intact 5/5 intact bilaterally with 10g monofilament b/l. Vibratory sensation intact b/l. Proprioception intact bilaterally.  Dermatologic Pedal skin with normal turgor, texture and tone bilaterally. No open wounds bilaterally. No interdigital macerations bilaterally. Toenails 1-5 b/l elongated, discolored, dystrophic, thickened, crumbly with subungual debris and tenderness to dorsal palpation.  Orthopedic: Normal muscle strength 5/5 to all lower extremity muscle groups bilaterally. No pain crepitus or joint limitation noted with ROM b/l. No gross bony deformities bilaterally.   Radiographs: None Assessment:   1. Pain due to onychomycosis of toenails of both feet   2. Type 2 diabetes mellitus with diabetic neuropathic arthropathy, unspecified whether long term insulin use ([place])    Plan:  Patient was evaluated and treated and all questions answered.  Onychomycosis with pain -Nails palliatively debridement as  below -Educated on self-care  Procedure: Nail Debridement Rationale: Pain Type of Debridement: manual, sharp debridement. Instrumentation: Nail nipper, rotary burr. Number of Nails: 10 -Examined patient. -Patient to continue soft, supportive shoe gear daily. -Toenails 1-5 b/l were debrided in length and girth with sterile nail nippers and dremel without iatrogenic bleeding.  -Patient to report any pedal injuries to medical professional immediately. -Patient/POA to call should there be question/concern in the interim.  Return in about 3 months (around 10/29/2020).  Marzetta Board, DPM

## 2020-08-07 ENCOUNTER — Telehealth: Payer: Self-pay | Admitting: Family Medicine

## 2020-08-07 NOTE — Telephone Encounter (Signed)
I left [name] a voicemail approving the verbal.

## 2020-08-07 NOTE — Telephone Encounter (Signed)
Per  Henderson Newcomer from [facility]  Need verbal orders for OT for 1 time week for 2 week  Follow by 1 week for 1 week Call [phone]

## 2020-08-12 ENCOUNTER — Telehealth: Payer: Self-pay | Admitting: Family Medicine

## 2020-08-12 NOTE — Telephone Encounter (Signed)
fyi

## 2020-08-12 NOTE — Telephone Encounter (Signed)
FYI -The patient missed two visits for PT, and she declined to reschedule due  to being out of town; [facility] will see her next week per [facility] nurse Renee [phone]

## 2020-08-17 DIAGNOSIS — Z8673 Personal history of transient ischemic attack (TIA), and cerebral infarction without residual deficits: Secondary | ICD-10-CM | POA: Diagnosis not present

## 2020-08-17 DIAGNOSIS — F32A Depression, unspecified: Secondary | ICD-10-CM | POA: Diagnosis not present

## 2020-08-17 DIAGNOSIS — N1831 Chronic kidney disease, stage 3a: Secondary | ICD-10-CM | POA: Diagnosis not present

## 2020-08-17 DIAGNOSIS — R778 Other specified abnormalities of plasma proteins: Secondary | ICD-10-CM | POA: Diagnosis not present

## 2020-08-17 DIAGNOSIS — I2692 Saddle embolus of pulmonary artery without acute cor pulmonale: Secondary | ICD-10-CM | POA: Diagnosis not present

## 2020-08-17 DIAGNOSIS — E785 Hyperlipidemia, unspecified: Secondary | ICD-10-CM | POA: Diagnosis not present

## 2020-08-17 DIAGNOSIS — E1122 Type 2 diabetes mellitus with diabetic chronic kidney disease: Secondary | ICD-10-CM | POA: Diagnosis not present

## 2020-08-17 DIAGNOSIS — K219 Gastro-esophageal reflux disease without esophagitis: Secondary | ICD-10-CM | POA: Diagnosis not present

## 2020-08-17 DIAGNOSIS — I129 Hypertensive chronic kidney disease with stage 1 through stage 4 chronic kidney disease, or unspecified chronic kidney disease: Secondary | ICD-10-CM | POA: Diagnosis not present

## 2020-08-17 DIAGNOSIS — Z79899 Other long term (current) drug therapy: Secondary | ICD-10-CM | POA: Diagnosis not present

## 2020-08-17 DIAGNOSIS — Z7901 Long term (current) use of anticoagulants: Secondary | ICD-10-CM | POA: Diagnosis not present

## 2020-08-21 ENCOUNTER — Telehealth: Payer: Self-pay | Admitting: Family Medicine

## 2020-08-21 DIAGNOSIS — K219 Gastro-esophageal reflux disease without esophagitis: Secondary | ICD-10-CM | POA: Diagnosis not present

## 2020-08-21 DIAGNOSIS — I129 Hypertensive chronic kidney disease with stage 1 through stage 4 chronic kidney disease, or unspecified chronic kidney disease: Secondary | ICD-10-CM | POA: Diagnosis not present

## 2020-08-21 DIAGNOSIS — I2692 Saddle embolus of pulmonary artery without acute cor pulmonale: Secondary | ICD-10-CM | POA: Diagnosis not present

## 2020-08-21 DIAGNOSIS — N1831 Chronic kidney disease, stage 3a: Secondary | ICD-10-CM | POA: Diagnosis not present

## 2020-08-21 DIAGNOSIS — R778 Other specified abnormalities of plasma proteins: Secondary | ICD-10-CM | POA: Diagnosis not present

## 2020-08-21 DIAGNOSIS — F32A Depression, unspecified: Secondary | ICD-10-CM | POA: Diagnosis not present

## 2020-08-21 DIAGNOSIS — Z8673 Personal history of transient ischemic attack (TIA), and cerebral infarction without residual deficits: Secondary | ICD-10-CM | POA: Diagnosis not present

## 2020-08-21 DIAGNOSIS — Z7901 Long term (current) use of anticoagulants: Secondary | ICD-10-CM | POA: Diagnosis not present

## 2020-08-21 DIAGNOSIS — E785 Hyperlipidemia, unspecified: Secondary | ICD-10-CM | POA: Diagnosis not present

## 2020-08-21 DIAGNOSIS — E1122 Type 2 diabetes mellitus with diabetic chronic kidney disease: Secondary | ICD-10-CM | POA: Diagnosis not present

## 2020-08-21 DIAGNOSIS — Z79899 Other long term (current) drug therapy: Secondary | ICD-10-CM | POA: Diagnosis not present

## 2020-08-21 NOTE — Telephone Encounter (Signed)
Claiborne Billings is calling for verbal orders to extend patients home health for physical therapy for 1 week 5, please advise. CB is [phone]

## 2020-08-21 NOTE — Telephone Encounter (Signed)
I left [employer] with the verbal on it.

## 2020-08-23 ENCOUNTER — Other Ambulatory Visit: Payer: Self-pay | Admitting: Family Medicine

## 2020-08-23 DIAGNOSIS — I674 Hypertensive encephalopathy: Secondary | ICD-10-CM

## 2020-08-27 DIAGNOSIS — N1831 Chronic kidney disease, stage 3a: Secondary | ICD-10-CM | POA: Diagnosis not present

## 2020-08-27 DIAGNOSIS — I2692 Saddle embolus of pulmonary artery without acute cor pulmonale: Secondary | ICD-10-CM | POA: Diagnosis not present

## 2020-08-27 DIAGNOSIS — Z79899 Other long term (current) drug therapy: Secondary | ICD-10-CM | POA: Diagnosis not present

## 2020-08-27 DIAGNOSIS — Z7901 Long term (current) use of anticoagulants: Secondary | ICD-10-CM | POA: Diagnosis not present

## 2020-08-27 DIAGNOSIS — E785 Hyperlipidemia, unspecified: Secondary | ICD-10-CM | POA: Diagnosis not present

## 2020-08-27 DIAGNOSIS — E1122 Type 2 diabetes mellitus with diabetic chronic kidney disease: Secondary | ICD-10-CM | POA: Diagnosis not present

## 2020-08-27 DIAGNOSIS — Z8673 Personal history of transient ischemic attack (TIA), and cerebral infarction without residual deficits: Secondary | ICD-10-CM | POA: Diagnosis not present

## 2020-08-27 DIAGNOSIS — I129 Hypertensive chronic kidney disease with stage 1 through stage 4 chronic kidney disease, or unspecified chronic kidney disease: Secondary | ICD-10-CM | POA: Diagnosis not present

## 2020-08-27 DIAGNOSIS — K219 Gastro-esophageal reflux disease without esophagitis: Secondary | ICD-10-CM | POA: Diagnosis not present

## 2020-08-27 DIAGNOSIS — R778 Other specified abnormalities of plasma proteins: Secondary | ICD-10-CM | POA: Diagnosis not present

## 2020-08-27 DIAGNOSIS — F32A Depression, unspecified: Secondary | ICD-10-CM | POA: Diagnosis not present

## 2020-08-28 DIAGNOSIS — N1831 Chronic kidney disease, stage 3a: Secondary | ICD-10-CM | POA: Diagnosis not present

## 2020-08-28 DIAGNOSIS — K219 Gastro-esophageal reflux disease without esophagitis: Secondary | ICD-10-CM | POA: Diagnosis not present

## 2020-08-28 DIAGNOSIS — R778 Other specified abnormalities of plasma proteins: Secondary | ICD-10-CM | POA: Diagnosis not present

## 2020-08-28 DIAGNOSIS — Z8673 Personal history of transient ischemic attack (TIA), and cerebral infarction without residual deficits: Secondary | ICD-10-CM | POA: Diagnosis not present

## 2020-08-28 DIAGNOSIS — F32A Depression, unspecified: Secondary | ICD-10-CM | POA: Diagnosis not present

## 2020-08-28 DIAGNOSIS — Z79899 Other long term (current) drug therapy: Secondary | ICD-10-CM | POA: Diagnosis not present

## 2020-08-28 DIAGNOSIS — I129 Hypertensive chronic kidney disease with stage 1 through stage 4 chronic kidney disease, or unspecified chronic kidney disease: Secondary | ICD-10-CM | POA: Diagnosis not present

## 2020-08-28 DIAGNOSIS — E1122 Type 2 diabetes mellitus with diabetic chronic kidney disease: Secondary | ICD-10-CM | POA: Diagnosis not present

## 2020-08-28 DIAGNOSIS — Z7901 Long term (current) use of anticoagulants: Secondary | ICD-10-CM | POA: Diagnosis not present

## 2020-08-28 DIAGNOSIS — I2692 Saddle embolus of pulmonary artery without acute cor pulmonale: Secondary | ICD-10-CM | POA: Diagnosis not present

## 2020-08-28 DIAGNOSIS — E785 Hyperlipidemia, unspecified: Secondary | ICD-10-CM | POA: Diagnosis not present

## 2020-08-31 ENCOUNTER — Ambulatory Visit (INDEPENDENT_AMBULATORY_CARE_PROVIDER_SITE_OTHER): Payer: Medicare Other | Admitting: Family Medicine

## 2020-08-31 ENCOUNTER — Other Ambulatory Visit: Payer: Self-pay

## 2020-08-31 ENCOUNTER — Encounter: Payer: Self-pay | Admitting: Family Medicine

## 2020-08-31 VITALS — BP 128/80 | HR 91 | Resp 16 | Ht 67.0 in | Wt 160.1 lb

## 2020-08-31 DIAGNOSIS — E785 Hyperlipidemia, unspecified: Secondary | ICD-10-CM | POA: Diagnosis not present

## 2020-08-31 DIAGNOSIS — F331 Major depressive disorder, recurrent, moderate: Secondary | ICD-10-CM

## 2020-08-31 DIAGNOSIS — Z7901 Long term (current) use of anticoagulants: Secondary | ICD-10-CM | POA: Diagnosis not present

## 2020-08-31 DIAGNOSIS — E1169 Type 2 diabetes mellitus with other specified complication: Secondary | ICD-10-CM | POA: Diagnosis not present

## 2020-08-31 DIAGNOSIS — I674 Hypertensive encephalopathy: Secondary | ICD-10-CM | POA: Diagnosis not present

## 2020-08-31 MED ORDER — BUPROPION HCL ER (SR) 100 MG PO TB12: 100.0000 mg | ORAL_TABLET | Freq: Two times a day (BID) | ORAL | 2 refills | Status: DC

## 2020-08-31 MED ORDER — OLMESARTAN MEDOXOMIL 20 MG PO TABS: 20.0000 mg | ORAL_TABLET | Freq: Every day | ORAL | 2 refills | Status: DC

## 2020-08-31 MED ORDER — EZETIMIBE 10 MG PO TABS: 10.0000 mg | ORAL_TABLET | Freq: Every day | ORAL | 2 refills | Status: DC

## 2020-08-31 NOTE — Assessment & Plan Note (Signed)
She has not tolerated statins. She agrees with trying Zetia 10 mg daily. Continue low fat diet.

## 2020-08-31 NOTE — Progress Notes (Signed)
HPI: [name] is a 80 y.o. female with hx of DM II with neuropathy,HTN,depression,anxiety,PE,and CKD III here today with her daughter for follow up.   She was last seen on 07/30/20 for hospital follow up. PE on Eliquis 5 mg bid. Tolerating medication well, she has not noted side effects. She has not noted more bruising than usual,nose/gum bleeding,melena,blood in stool,or gross hematuria. She is asking if she needs imaging to see if clot has dissolved.  HTN: Last visit Amlodipine dose was decreased from 5 mg to 2.5 mg. She is also on Olmesartan 20 mg daily. BP's still on lower normal range at home: 121/77,108/58,118/65,112/61. HR 80's/min. Negative for severe/frequent headache, visual changes, chest pain, dyspnea, palpitation, new focal weakness, or edema.  Lab Results  Component Value Date   CREATININE 1.07 07/30/2020   BUN 26 (H) 07/30/2020   NA 137 07/30/2020   K 4.6 07/30/2020   CL 105 07/30/2020   CO2 24 07/30/2020   Today her daughter is concerned about worsening depression for the past 2 weeks. No known exacerbating factor. Problem has not been well controlled for the past few years but has been stable. She is on Duloxetine, which has helped with peripheral neuropathy. Wellbutrin SR 75 mg bid. Sleeping about 7-8 hours and taking long naps during the day.  She has tolerated medications well.  Depression screen [facility] 2/9 08/31/2020 02/05/2020 08/07/2019 01/30/2019 09/26/2017  Decreased Interest 3 0 3 1 3   Down, Depressed, Hopeless 2 0 2 0 -  PHQ - 2 Score 5 0 5 1 3   Altered sleeping 3 - 2 - 0  Tired, decreased energy 3 - 3 - 3  Change in appetite 2 - 3 - 0  Feeling bad or failure about yourself  3 - 3 - 3  Trouble concentrating 1 - 2 - 3  Moving slowly or fidgety/restless 0 - 1 - 0  Suicidal thoughts 0 - 1 - -  PHQ-9 Score 17 - 20 - 12  Difficult doing work/chores Somewhat difficult - Somewhat difficult - Not difficult at all   GAD 7 : Generalized  Anxiety Score 08/31/2020 08/07/2019  Nervous, Anxious, on Edge 1 1  Control/stop worrying 3 1  Worry too much - different things 3 1  Trouble relaxing 1 1  Restless 0 1  Easily annoyed or irritable 3 1  Afraid - awful might happen 1 1  Total GAD 7 Score 12 7  Anxiety Difficulty Somewhat difficult Somewhat difficult   HLD: She has not tolerated stains in the past, including Atorvastatin and Pravastatin. She is following low fat diet.  Lab Results  Component Value Date   CHOL 263 (H) 02/11/2020   HDL 77 02/11/2020   LDLCALC 164 (H) 02/11/2020   TRIG 106 02/11/2020   CHOLHDL 3.4 02/11/2020   Review of Systems  Constitutional:  Positive for activity change and fatigue. Negative for appetite change and fever.  HENT:  Negative for mouth sores and sore throat.   Respiratory:  Positive for cough (Occasionally). Negative for wheezing.   Gastrointestinal:  Negative for abdominal pain, nausea and vomiting.       Negative for changes in bowel habits.  Genitourinary:  Negative for decreased urine volume and dysuria.  Musculoskeletal:  Positive for gait problem. Negative for myalgias.  Neurological:  Negative for syncope and facial asymmetry.  Psychiatric/Behavioral:  Negative for confusion and hallucinations.   Rest of ROS, see pertinent positives sand negatives in HPI  Current Outpatient Medications on  File Prior to Visit  Medication Sig Dispense Refill   ACCU-CHEK AVIVA PLUS test strip USE TO TEST BLOOD SUGAR ONCE DAILY 100 strip 1   Accu-Chek Softclix Lancets lancets Use to check blood sugar once daily 100 each 2   acetaminophen (TYLENOL) 325 MG tablet Take 650 mg by mouth every 6 (six) hours as needed for mild pain, fever or headache.     allopurinol (ZYLOPRIM) 100 MG tablet Take 1 tablet (100 mg total) by mouth 2 (two) times daily. 180 tablet 3   Alpha-Lipoic Acid 600 MG TABS Take 600 mg by mouth daily.     apixaban (ELIQUIS) 5 MG TABS tablet Take 1 tablet (5 mg total) by mouth 2 (two)  times daily. Start after completing the starter pack 180 tablet 1   APIXABAN (ELIQUIS) VTE STARTER PACK (10MG  AND 5MG ) Take as directed on package: start with two-5mg  tablets twice daily for 7 days. On day 8, switch to one-5mg  tablet twice daily. 1 each 0   colchicine 0.6 MG tablet TAKE ONE TABLET BY MOUTH TWICE A DAY (Patient taking differently: Take 0.6 mg by mouth 2 (two) times daily.) 30 tablet 0   DULoxetine (CYMBALTA) 60 MG capsule TAKE ONE CAPSULE BY MOUTH DAILY (Patient taking differently: Take 60 mg by mouth daily.) 90 capsule 2   Homeopathic Products (THERAWORX RELIEF EX) Apply 1 application topically daily as needed (foot cramps).     Lancet Devices (ACCU-CHEK SOFTCLIX) lancets Use to test blood sugar once a day. 100 each 2   Multiple Vitamin (MULTIVITAMIN) capsule Take 1 capsule by mouth daily.      Omega-3 Fatty Acids (FISH OIL) 1000 MG CAPS Take 1,000 mg by mouth 2 (two) times daily.     pantoprazole (PROTONIX) 20 MG tablet TAKE ONE TABLET BY MOUTH DAILY 90 tablet 2   No current facility-administered medications on file prior to visit.   Past Medical History:  Diagnosis Date   Chicken pox    Depression    Diabetes mellitus without complication (HCC)    GERD (gastroesophageal reflux disease)    Hyperlipidemia    Hypertension    Stroke [facility])    UTI (urinary tract infection)    No Known Allergies  Social History   Socioeconomic History   Marital status: Widowed    Spouse name: Not on file   Number of children: Not on file   Years of education: Not on file   Highest education level: Not on file  Occupational History   Occupation: N/A  Tobacco Use   Smoking status: Never   Smokeless tobacco: Never  Vaping Use   Vaping Use: Never used  Substance and Sexual Activity   Alcohol use: Yes    Comment: occassional wine   Drug use: No   Sexual activity: Not Currently  Other Topics Concern   Not on file  Social History Narrative   Pt lives in single story home with her  daughter   Has 2 children   Some college education   Retired Engineer, production for [employer]   Social Determinants of Health   Financial Resource Strain: Low Risk    Difficulty of Paying Living Expenses: Not hard at all  Food Insecurity: No Food Insecurity   Worried About Charity fundraiser in the Last Year: Never true   Arboriculturist in the Last Year: Never true  Transportation Needs: No Transportation Needs   Lack of Transportation (Medical): No   Lack of Transportation (Non-Medical):  No  Physical Activity: Insufficiently Active   Days of Exercise per Week: 2 days   Minutes of Exercise per Session: 30 min  Stress: No Stress Concern Present   Feeling of Stress : Not at all  Social Connections: Moderately Integrated   Frequency of Communication with Friends and Family: More than three times a week   Frequency of Social Gatherings with Friends and Family: Once a week   Attends Religious Services: More than 4 times per year   Active Member of [employer] or Organizations: Yes   Attends Archivist Meetings: 1 to 4 times per year   Marital Status: Widowed    Vitals:   08/31/20 1142  BP: 128/80  Pulse: 91  Resp: 16  SpO2: 97%   Wt Readings from Last 3 Encounters:  08/31/20 160 lb 2 oz (72.6 kg)  07/30/20 156 lb 12.8 oz (71.1 kg)  07/24/20 156 lb 8.4 oz (71 kg)   Body mass index is 25.08 kg/m.  Physical Exam Vitals and nursing note reviewed.  Constitutional:      General: She is not in acute distress.    Appearance: She is well-developed.  HENT:     Head: Normocephalic and atraumatic.     Mouth/Throat:     Mouth: Mucous membranes are moist.  Eyes:     Conjunctiva/sclera: Conjunctivae normal.  Cardiovascular:     Rate and Rhythm: Normal rate and regular rhythm.     Heart sounds: No murmur heard.    Comments: PT pulses present. Pulmonary:     Effort: Pulmonary effort is normal. No respiratory distress.     Breath sounds: Normal breath sounds.   Abdominal:     Palpations: Abdomen is soft.     Tenderness: There is no abdominal tenderness.  Skin:    General: Skin is warm.     Findings: No erythema or rash.  Neurological:     Mental Status: She is alert and oriented to person, place, and time.     Cranial Nerves: No cranial nerve deficit.     Comments: Unstable gait assisted with a cane.  Psychiatric:        Mood and Affect: Affect is not labile.        Speech: Speech normal.        Thought Content: Thought content does not include suicidal ideation. Thought content does not include suicidal plan.     Comments: Well groomed, good eye contact.    ASSESSMENT AND PLAN:  Ms. JALEY YAN was seen today for follow-up.  Systolic hypertension with cerebrovascular disease BP still running mildly low at home. Amlodipine 2.5 mg to discontinue. Continue Olmesartan 20 mg daily and low salt diet. Continue monitoring BP regularly.  Depression, major, recurrent ([place]) Problem is not well controlled. She would like to hold on psychiatrist evaluation for now. She agrees with increasing dose of Wellbutrin SR from 75 mg to 100 mg bid. No changes in Cymbalta dose. Instructed about warning signs. She is not interested in CBT.  Hyperlipidemia associated with type 2 diabetes mellitus ([place]) She has not tolerated statins. She agrees with trying Zetia 10 mg daily. Continue low fat diet.  Chronic anticoagulation We discussed indications. Last OV we decided that she will be on Eliquis indefinitely as far as she is tolerating well and has no signs of bleeding. Some side effects reviewed. No further study is needed at this time.   Return in about 2 months (around 10/31/2020) for depression.   Inez Catalina  G. Martinique, MD  [facility]. [place] office.   A few things to remember from today's visit:   Moderate episode of recurrent major depressive disorder ([place])  Systolic hypertension with cerebrovascular disease - Plan:  olmesartan (BENICAR) 20 MG tablet  Hyperlipidemia associated with type 2 diabetes mellitus ([place])  Chronic anticoagulation  If you need refills please call your pharmacy. Do not use My Chart to request refills or for acute issues that need immediate attention.   Today we increased dose of Wellbutrin from 75 mg to 100 mg 2 times daily and we stopped Amlodipine. We also started Zetia 10 mg daily for cholesterol.  Fall precautions. Continue PT.  Please be sure medication list is accurate. If a new problem present, please set up appointment sooner than planned today.

## 2020-08-31 NOTE — Patient Instructions (Addendum)
A few things to remember from today's visit:   Moderate episode of recurrent major depressive disorder ([place])  Systolic hypertension with cerebrovascular disease - Plan: olmesartan (BENICAR) 20 MG tablet  Hyperlipidemia associated with type 2 diabetes mellitus ([place])  Chronic anticoagulation  If you need refills please call your pharmacy. Do not use My Chart to request refills or for acute issues that need immediate attention.   Today we increased dose of Wellbutrin from 75 mg to 100 mg 2 times daily and we stopped Amlodipine. We also started Zetia 10 mg daily for cholesterol.  Fall precautions. Continue PT.  Please be sure medication list is accurate. If a new problem present, please set up appointment sooner than planned today.

## 2020-08-31 NOTE — Assessment & Plan Note (Signed)
BP still running mildly low at home. Amlodipine 2.5 mg to discontinue. Continue Olmesartan 20 mg daily and low salt diet. Continue monitoring BP regularly.

## 2020-08-31 NOTE — Assessment & Plan Note (Signed)
Problem is not well controlled. She would like to hold on psychiatrist evaluation for now. She agrees with increasing dose of Wellbutrin SR from 75 mg to 100 mg bid. No changes in Cymbalta dose. Instructed about warning signs. She is not interested in CBT.

## 2020-09-01 NOTE — Telephone Encounter (Signed)
Alexa Brown is calling back from [facility] and is requesting verbal orders to continue OT to increase independence with ADL"S. Frequency is 1 week 4, please advise. [phone]

## 2020-09-01 NOTE — Telephone Encounter (Signed)
I left Alexa Brown a voicemail approving the verbal orders.

## 2020-09-02 DIAGNOSIS — R778 Other specified abnormalities of plasma proteins: Secondary | ICD-10-CM | POA: Diagnosis not present

## 2020-09-02 DIAGNOSIS — E785 Hyperlipidemia, unspecified: Secondary | ICD-10-CM | POA: Diagnosis not present

## 2020-09-02 DIAGNOSIS — Z79899 Other long term (current) drug therapy: Secondary | ICD-10-CM | POA: Diagnosis not present

## 2020-09-02 DIAGNOSIS — F32A Depression, unspecified: Secondary | ICD-10-CM | POA: Diagnosis not present

## 2020-09-02 DIAGNOSIS — N1831 Chronic kidney disease, stage 3a: Secondary | ICD-10-CM | POA: Diagnosis not present

## 2020-09-02 DIAGNOSIS — E1122 Type 2 diabetes mellitus with diabetic chronic kidney disease: Secondary | ICD-10-CM | POA: Diagnosis not present

## 2020-09-02 DIAGNOSIS — K219 Gastro-esophageal reflux disease without esophagitis: Secondary | ICD-10-CM | POA: Diagnosis not present

## 2020-09-02 DIAGNOSIS — I2692 Saddle embolus of pulmonary artery without acute cor pulmonale: Secondary | ICD-10-CM | POA: Diagnosis not present

## 2020-09-02 DIAGNOSIS — I129 Hypertensive chronic kidney disease with stage 1 through stage 4 chronic kidney disease, or unspecified chronic kidney disease: Secondary | ICD-10-CM | POA: Diagnosis not present

## 2020-09-02 DIAGNOSIS — Z7901 Long term (current) use of anticoagulants: Secondary | ICD-10-CM | POA: Diagnosis not present

## 2020-09-02 DIAGNOSIS — Z8673 Personal history of transient ischemic attack (TIA), and cerebral infarction without residual deficits: Secondary | ICD-10-CM | POA: Diagnosis not present

## 2020-09-04 DIAGNOSIS — F32A Depression, unspecified: Secondary | ICD-10-CM | POA: Diagnosis not present

## 2020-09-04 DIAGNOSIS — Z8673 Personal history of transient ischemic attack (TIA), and cerebral infarction without residual deficits: Secondary | ICD-10-CM | POA: Diagnosis not present

## 2020-09-04 DIAGNOSIS — I129 Hypertensive chronic kidney disease with stage 1 through stage 4 chronic kidney disease, or unspecified chronic kidney disease: Secondary | ICD-10-CM | POA: Diagnosis not present

## 2020-09-04 DIAGNOSIS — R778 Other specified abnormalities of plasma proteins: Secondary | ICD-10-CM | POA: Diagnosis not present

## 2020-09-04 DIAGNOSIS — N1831 Chronic kidney disease, stage 3a: Secondary | ICD-10-CM | POA: Diagnosis not present

## 2020-09-04 DIAGNOSIS — Z79899 Other long term (current) drug therapy: Secondary | ICD-10-CM | POA: Diagnosis not present

## 2020-09-04 DIAGNOSIS — E1122 Type 2 diabetes mellitus with diabetic chronic kidney disease: Secondary | ICD-10-CM | POA: Diagnosis not present

## 2020-09-04 DIAGNOSIS — K219 Gastro-esophageal reflux disease without esophagitis: Secondary | ICD-10-CM | POA: Diagnosis not present

## 2020-09-04 DIAGNOSIS — E785 Hyperlipidemia, unspecified: Secondary | ICD-10-CM | POA: Diagnosis not present

## 2020-09-04 DIAGNOSIS — Z7901 Long term (current) use of anticoagulants: Secondary | ICD-10-CM | POA: Diagnosis not present

## 2020-09-04 DIAGNOSIS — I2692 Saddle embolus of pulmonary artery without acute cor pulmonale: Secondary | ICD-10-CM | POA: Diagnosis not present

## 2020-09-08 DIAGNOSIS — E1122 Type 2 diabetes mellitus with diabetic chronic kidney disease: Secondary | ICD-10-CM | POA: Diagnosis not present

## 2020-09-08 DIAGNOSIS — I129 Hypertensive chronic kidney disease with stage 1 through stage 4 chronic kidney disease, or unspecified chronic kidney disease: Secondary | ICD-10-CM | POA: Diagnosis not present

## 2020-09-08 DIAGNOSIS — R778 Other specified abnormalities of plasma proteins: Secondary | ICD-10-CM | POA: Diagnosis not present

## 2020-09-08 DIAGNOSIS — K219 Gastro-esophageal reflux disease without esophagitis: Secondary | ICD-10-CM | POA: Diagnosis not present

## 2020-09-08 DIAGNOSIS — F32A Depression, unspecified: Secondary | ICD-10-CM | POA: Diagnosis not present

## 2020-09-08 DIAGNOSIS — Z79899 Other long term (current) drug therapy: Secondary | ICD-10-CM | POA: Diagnosis not present

## 2020-09-08 DIAGNOSIS — E785 Hyperlipidemia, unspecified: Secondary | ICD-10-CM | POA: Diagnosis not present

## 2020-09-08 DIAGNOSIS — I2692 Saddle embolus of pulmonary artery without acute cor pulmonale: Secondary | ICD-10-CM | POA: Diagnosis not present

## 2020-09-08 DIAGNOSIS — N1831 Chronic kidney disease, stage 3a: Secondary | ICD-10-CM | POA: Diagnosis not present

## 2020-09-08 DIAGNOSIS — Z8673 Personal history of transient ischemic attack (TIA), and cerebral infarction without residual deficits: Secondary | ICD-10-CM | POA: Diagnosis not present

## 2020-09-08 DIAGNOSIS — Z7901 Long term (current) use of anticoagulants: Secondary | ICD-10-CM | POA: Diagnosis not present

## 2020-09-09 DIAGNOSIS — R778 Other specified abnormalities of plasma proteins: Secondary | ICD-10-CM | POA: Diagnosis not present

## 2020-09-09 DIAGNOSIS — Z8673 Personal history of transient ischemic attack (TIA), and cerebral infarction without residual deficits: Secondary | ICD-10-CM | POA: Diagnosis not present

## 2020-09-09 DIAGNOSIS — Z79899 Other long term (current) drug therapy: Secondary | ICD-10-CM | POA: Diagnosis not present

## 2020-09-09 DIAGNOSIS — N1831 Chronic kidney disease, stage 3a: Secondary | ICD-10-CM | POA: Diagnosis not present

## 2020-09-09 DIAGNOSIS — Z7901 Long term (current) use of anticoagulants: Secondary | ICD-10-CM | POA: Diagnosis not present

## 2020-09-09 DIAGNOSIS — F32A Depression, unspecified: Secondary | ICD-10-CM | POA: Diagnosis not present

## 2020-09-09 DIAGNOSIS — I2692 Saddle embolus of pulmonary artery without acute cor pulmonale: Secondary | ICD-10-CM | POA: Diagnosis not present

## 2020-09-09 DIAGNOSIS — E1122 Type 2 diabetes mellitus with diabetic chronic kidney disease: Secondary | ICD-10-CM | POA: Diagnosis not present

## 2020-09-09 DIAGNOSIS — I129 Hypertensive chronic kidney disease with stage 1 through stage 4 chronic kidney disease, or unspecified chronic kidney disease: Secondary | ICD-10-CM | POA: Diagnosis not present

## 2020-09-09 DIAGNOSIS — E785 Hyperlipidemia, unspecified: Secondary | ICD-10-CM | POA: Diagnosis not present

## 2020-09-09 DIAGNOSIS — K219 Gastro-esophageal reflux disease without esophagitis: Secondary | ICD-10-CM | POA: Diagnosis not present

## 2020-09-14 DIAGNOSIS — I2692 Saddle embolus of pulmonary artery without acute cor pulmonale: Secondary | ICD-10-CM | POA: Diagnosis not present

## 2020-09-14 DIAGNOSIS — K219 Gastro-esophageal reflux disease without esophagitis: Secondary | ICD-10-CM | POA: Diagnosis not present

## 2020-09-14 DIAGNOSIS — E1122 Type 2 diabetes mellitus with diabetic chronic kidney disease: Secondary | ICD-10-CM | POA: Diagnosis not present

## 2020-09-14 DIAGNOSIS — R778 Other specified abnormalities of plasma proteins: Secondary | ICD-10-CM | POA: Diagnosis not present

## 2020-09-14 DIAGNOSIS — Z79899 Other long term (current) drug therapy: Secondary | ICD-10-CM | POA: Diagnosis not present

## 2020-09-14 DIAGNOSIS — F32A Depression, unspecified: Secondary | ICD-10-CM | POA: Diagnosis not present

## 2020-09-14 DIAGNOSIS — I129 Hypertensive chronic kidney disease with stage 1 through stage 4 chronic kidney disease, or unspecified chronic kidney disease: Secondary | ICD-10-CM | POA: Diagnosis not present

## 2020-09-14 DIAGNOSIS — E785 Hyperlipidemia, unspecified: Secondary | ICD-10-CM | POA: Diagnosis not present

## 2020-09-14 DIAGNOSIS — Z7901 Long term (current) use of anticoagulants: Secondary | ICD-10-CM | POA: Diagnosis not present

## 2020-09-14 DIAGNOSIS — Z8673 Personal history of transient ischemic attack (TIA), and cerebral infarction without residual deficits: Secondary | ICD-10-CM | POA: Diagnosis not present

## 2020-09-14 DIAGNOSIS — N1831 Chronic kidney disease, stage 3a: Secondary | ICD-10-CM | POA: Diagnosis not present

## 2020-09-16 DIAGNOSIS — Z79899 Other long term (current) drug therapy: Secondary | ICD-10-CM | POA: Diagnosis not present

## 2020-09-16 DIAGNOSIS — I2692 Saddle embolus of pulmonary artery without acute cor pulmonale: Secondary | ICD-10-CM | POA: Diagnosis not present

## 2020-09-16 DIAGNOSIS — E1122 Type 2 diabetes mellitus with diabetic chronic kidney disease: Secondary | ICD-10-CM | POA: Diagnosis not present

## 2020-09-16 DIAGNOSIS — I129 Hypertensive chronic kidney disease with stage 1 through stage 4 chronic kidney disease, or unspecified chronic kidney disease: Secondary | ICD-10-CM | POA: Diagnosis not present

## 2020-09-16 DIAGNOSIS — F32A Depression, unspecified: Secondary | ICD-10-CM | POA: Diagnosis not present

## 2020-09-16 DIAGNOSIS — Z8673 Personal history of transient ischemic attack (TIA), and cerebral infarction without residual deficits: Secondary | ICD-10-CM | POA: Diagnosis not present

## 2020-09-16 DIAGNOSIS — R778 Other specified abnormalities of plasma proteins: Secondary | ICD-10-CM | POA: Diagnosis not present

## 2020-09-16 DIAGNOSIS — E785 Hyperlipidemia, unspecified: Secondary | ICD-10-CM | POA: Diagnosis not present

## 2020-09-16 DIAGNOSIS — N1831 Chronic kidney disease, stage 3a: Secondary | ICD-10-CM | POA: Diagnosis not present

## 2020-09-16 DIAGNOSIS — Z7901 Long term (current) use of anticoagulants: Secondary | ICD-10-CM | POA: Diagnosis not present

## 2020-09-16 DIAGNOSIS — K219 Gastro-esophageal reflux disease without esophagitis: Secondary | ICD-10-CM | POA: Diagnosis not present

## 2020-09-21 ENCOUNTER — Encounter: Payer: Self-pay | Admitting: Family Medicine

## 2020-09-21 DIAGNOSIS — H35372 Puckering of macula, left eye: Secondary | ICD-10-CM | POA: Diagnosis not present

## 2020-09-21 DIAGNOSIS — H532 Diplopia: Secondary | ICD-10-CM | POA: Diagnosis not present

## 2020-09-21 DIAGNOSIS — Z961 Presence of intraocular lens: Secondary | ICD-10-CM | POA: Diagnosis not present

## 2020-09-21 DIAGNOSIS — E119 Type 2 diabetes mellitus without complications: Secondary | ICD-10-CM | POA: Diagnosis not present

## 2020-09-21 LAB — HM DIABETES EYE EXAM

## 2020-09-23 ENCOUNTER — Telehealth: Payer: Self-pay | Admitting: Family Medicine

## 2020-09-23 DIAGNOSIS — Z8673 Personal history of transient ischemic attack (TIA), and cerebral infarction without residual deficits: Secondary | ICD-10-CM | POA: Diagnosis not present

## 2020-09-23 DIAGNOSIS — R778 Other specified abnormalities of plasma proteins: Secondary | ICD-10-CM | POA: Diagnosis not present

## 2020-09-23 DIAGNOSIS — I129 Hypertensive chronic kidney disease with stage 1 through stage 4 chronic kidney disease, or unspecified chronic kidney disease: Secondary | ICD-10-CM | POA: Diagnosis not present

## 2020-09-23 DIAGNOSIS — E785 Hyperlipidemia, unspecified: Secondary | ICD-10-CM | POA: Diagnosis not present

## 2020-09-23 DIAGNOSIS — N1831 Chronic kidney disease, stage 3a: Secondary | ICD-10-CM | POA: Diagnosis not present

## 2020-09-23 DIAGNOSIS — Z79899 Other long term (current) drug therapy: Secondary | ICD-10-CM | POA: Diagnosis not present

## 2020-09-23 DIAGNOSIS — F32A Depression, unspecified: Secondary | ICD-10-CM | POA: Diagnosis not present

## 2020-09-23 DIAGNOSIS — I2692 Saddle embolus of pulmonary artery without acute cor pulmonale: Secondary | ICD-10-CM | POA: Diagnosis not present

## 2020-09-23 DIAGNOSIS — E1122 Type 2 diabetes mellitus with diabetic chronic kidney disease: Secondary | ICD-10-CM | POA: Diagnosis not present

## 2020-09-23 DIAGNOSIS — Z7901 Long term (current) use of anticoagulants: Secondary | ICD-10-CM | POA: Diagnosis not present

## 2020-09-23 DIAGNOSIS — K219 Gastro-esophageal reflux disease without esophagitis: Secondary | ICD-10-CM | POA: Diagnosis not present

## 2020-09-23 NOTE — Telephone Encounter (Signed)
I left the verbal on Susan's voicemail.

## 2020-09-23 NOTE — Telephone Encounter (Signed)
[name] w/[facility] is call for verbal orders for OT 1 week 5.  May leave a detail msg on her secured voicemail.

## 2020-09-24 DIAGNOSIS — E785 Hyperlipidemia, unspecified: Secondary | ICD-10-CM | POA: Diagnosis not present

## 2020-09-24 DIAGNOSIS — Z79899 Other long term (current) drug therapy: Secondary | ICD-10-CM | POA: Diagnosis not present

## 2020-09-24 DIAGNOSIS — F32A Depression, unspecified: Secondary | ICD-10-CM | POA: Diagnosis not present

## 2020-09-24 DIAGNOSIS — K219 Gastro-esophageal reflux disease without esophagitis: Secondary | ICD-10-CM | POA: Diagnosis not present

## 2020-09-24 DIAGNOSIS — I129 Hypertensive chronic kidney disease with stage 1 through stage 4 chronic kidney disease, or unspecified chronic kidney disease: Secondary | ICD-10-CM | POA: Diagnosis not present

## 2020-09-24 DIAGNOSIS — N1831 Chronic kidney disease, stage 3a: Secondary | ICD-10-CM | POA: Diagnosis not present

## 2020-09-24 DIAGNOSIS — E1122 Type 2 diabetes mellitus with diabetic chronic kidney disease: Secondary | ICD-10-CM | POA: Diagnosis not present

## 2020-09-24 DIAGNOSIS — I2692 Saddle embolus of pulmonary artery without acute cor pulmonale: Secondary | ICD-10-CM | POA: Diagnosis not present

## 2020-09-24 DIAGNOSIS — R778 Other specified abnormalities of plasma proteins: Secondary | ICD-10-CM | POA: Diagnosis not present

## 2020-09-24 DIAGNOSIS — Z7901 Long term (current) use of anticoagulants: Secondary | ICD-10-CM | POA: Diagnosis not present

## 2020-09-24 DIAGNOSIS — Z8673 Personal history of transient ischemic attack (TIA), and cerebral infarction without residual deficits: Secondary | ICD-10-CM | POA: Diagnosis not present

## 2020-09-25 ENCOUNTER — Telehealth: Payer: Self-pay | Admitting: Family Medicine

## 2020-09-25 NOTE — Telephone Encounter (Signed)
Claiborne Billings form [facility] call and stated  she want a verbal order for Home physical therapy 1 week -9. Claiborne Billings 's # is [phone].

## 2020-09-28 ENCOUNTER — Other Ambulatory Visit: Payer: Self-pay | Admitting: Family Medicine

## 2020-09-28 DIAGNOSIS — I674 Hypertensive encephalopathy: Secondary | ICD-10-CM

## 2020-09-29 NOTE — Telephone Encounter (Signed)
Verbal left on [employer].

## 2020-10-02 DIAGNOSIS — K219 Gastro-esophageal reflux disease without esophagitis: Secondary | ICD-10-CM | POA: Diagnosis not present

## 2020-10-02 DIAGNOSIS — I129 Hypertensive chronic kidney disease with stage 1 through stage 4 chronic kidney disease, or unspecified chronic kidney disease: Secondary | ICD-10-CM | POA: Diagnosis not present

## 2020-10-02 DIAGNOSIS — F32A Depression, unspecified: Secondary | ICD-10-CM | POA: Diagnosis not present

## 2020-10-02 DIAGNOSIS — N1831 Chronic kidney disease, stage 3a: Secondary | ICD-10-CM | POA: Diagnosis not present

## 2020-10-02 DIAGNOSIS — Z9181 History of falling: Secondary | ICD-10-CM | POA: Diagnosis not present

## 2020-10-02 DIAGNOSIS — I2692 Saddle embolus of pulmonary artery without acute cor pulmonale: Secondary | ICD-10-CM | POA: Diagnosis not present

## 2020-10-02 DIAGNOSIS — E785 Hyperlipidemia, unspecified: Secondary | ICD-10-CM | POA: Diagnosis not present

## 2020-10-02 DIAGNOSIS — Z8673 Personal history of transient ischemic attack (TIA), and cerebral infarction without residual deficits: Secondary | ICD-10-CM | POA: Diagnosis not present

## 2020-10-02 DIAGNOSIS — Z79899 Other long term (current) drug therapy: Secondary | ICD-10-CM | POA: Diagnosis not present

## 2020-10-02 DIAGNOSIS — E1122 Type 2 diabetes mellitus with diabetic chronic kidney disease: Secondary | ICD-10-CM | POA: Diagnosis not present

## 2020-10-02 DIAGNOSIS — R778 Other specified abnormalities of plasma proteins: Secondary | ICD-10-CM | POA: Diagnosis not present

## 2020-10-02 DIAGNOSIS — Z7901 Long term (current) use of anticoagulants: Secondary | ICD-10-CM | POA: Diagnosis not present

## 2020-10-05 DIAGNOSIS — K219 Gastro-esophageal reflux disease without esophagitis: Secondary | ICD-10-CM | POA: Diagnosis not present

## 2020-10-05 DIAGNOSIS — Z79899 Other long term (current) drug therapy: Secondary | ICD-10-CM | POA: Diagnosis not present

## 2020-10-05 DIAGNOSIS — I129 Hypertensive chronic kidney disease with stage 1 through stage 4 chronic kidney disease, or unspecified chronic kidney disease: Secondary | ICD-10-CM | POA: Diagnosis not present

## 2020-10-05 DIAGNOSIS — N1831 Chronic kidney disease, stage 3a: Secondary | ICD-10-CM | POA: Diagnosis not present

## 2020-10-05 DIAGNOSIS — Z9181 History of falling: Secondary | ICD-10-CM | POA: Diagnosis not present

## 2020-10-05 DIAGNOSIS — R778 Other specified abnormalities of plasma proteins: Secondary | ICD-10-CM | POA: Diagnosis not present

## 2020-10-05 DIAGNOSIS — F32A Depression, unspecified: Secondary | ICD-10-CM | POA: Diagnosis not present

## 2020-10-05 DIAGNOSIS — I2692 Saddle embolus of pulmonary artery without acute cor pulmonale: Secondary | ICD-10-CM | POA: Diagnosis not present

## 2020-10-05 DIAGNOSIS — E785 Hyperlipidemia, unspecified: Secondary | ICD-10-CM | POA: Diagnosis not present

## 2020-10-05 DIAGNOSIS — Z8673 Personal history of transient ischemic attack (TIA), and cerebral infarction without residual deficits: Secondary | ICD-10-CM | POA: Diagnosis not present

## 2020-10-05 DIAGNOSIS — Z7901 Long term (current) use of anticoagulants: Secondary | ICD-10-CM | POA: Diagnosis not present

## 2020-10-05 DIAGNOSIS — E1122 Type 2 diabetes mellitus with diabetic chronic kidney disease: Secondary | ICD-10-CM | POA: Diagnosis not present

## 2020-10-07 DIAGNOSIS — E785 Hyperlipidemia, unspecified: Secondary | ICD-10-CM | POA: Diagnosis not present

## 2020-10-07 DIAGNOSIS — Z7901 Long term (current) use of anticoagulants: Secondary | ICD-10-CM | POA: Diagnosis not present

## 2020-10-07 DIAGNOSIS — R778 Other specified abnormalities of plasma proteins: Secondary | ICD-10-CM | POA: Diagnosis not present

## 2020-10-07 DIAGNOSIS — Z9181 History of falling: Secondary | ICD-10-CM | POA: Diagnosis not present

## 2020-10-07 DIAGNOSIS — I2692 Saddle embolus of pulmonary artery without acute cor pulmonale: Secondary | ICD-10-CM | POA: Diagnosis not present

## 2020-10-07 DIAGNOSIS — F32A Depression, unspecified: Secondary | ICD-10-CM | POA: Diagnosis not present

## 2020-10-07 DIAGNOSIS — K219 Gastro-esophageal reflux disease without esophagitis: Secondary | ICD-10-CM | POA: Diagnosis not present

## 2020-10-07 DIAGNOSIS — E1122 Type 2 diabetes mellitus with diabetic chronic kidney disease: Secondary | ICD-10-CM | POA: Diagnosis not present

## 2020-10-07 DIAGNOSIS — Z79899 Other long term (current) drug therapy: Secondary | ICD-10-CM | POA: Diagnosis not present

## 2020-10-07 DIAGNOSIS — N1831 Chronic kidney disease, stage 3a: Secondary | ICD-10-CM | POA: Diagnosis not present

## 2020-10-07 DIAGNOSIS — Z8673 Personal history of transient ischemic attack (TIA), and cerebral infarction without residual deficits: Secondary | ICD-10-CM | POA: Diagnosis not present

## 2020-10-07 DIAGNOSIS — I129 Hypertensive chronic kidney disease with stage 1 through stage 4 chronic kidney disease, or unspecified chronic kidney disease: Secondary | ICD-10-CM | POA: Diagnosis not present

## 2020-10-12 DIAGNOSIS — E1122 Type 2 diabetes mellitus with diabetic chronic kidney disease: Secondary | ICD-10-CM | POA: Diagnosis not present

## 2020-10-12 DIAGNOSIS — K219 Gastro-esophageal reflux disease without esophagitis: Secondary | ICD-10-CM | POA: Diagnosis not present

## 2020-10-12 DIAGNOSIS — R778 Other specified abnormalities of plasma proteins: Secondary | ICD-10-CM | POA: Diagnosis not present

## 2020-10-12 DIAGNOSIS — N1831 Chronic kidney disease, stage 3a: Secondary | ICD-10-CM | POA: Diagnosis not present

## 2020-10-12 DIAGNOSIS — F32A Depression, unspecified: Secondary | ICD-10-CM | POA: Diagnosis not present

## 2020-10-12 DIAGNOSIS — I129 Hypertensive chronic kidney disease with stage 1 through stage 4 chronic kidney disease, or unspecified chronic kidney disease: Secondary | ICD-10-CM | POA: Diagnosis not present

## 2020-10-12 DIAGNOSIS — Z7901 Long term (current) use of anticoagulants: Secondary | ICD-10-CM | POA: Diagnosis not present

## 2020-10-12 DIAGNOSIS — Z9181 History of falling: Secondary | ICD-10-CM | POA: Diagnosis not present

## 2020-10-12 DIAGNOSIS — E785 Hyperlipidemia, unspecified: Secondary | ICD-10-CM | POA: Diagnosis not present

## 2020-10-12 DIAGNOSIS — Z79899 Other long term (current) drug therapy: Secondary | ICD-10-CM | POA: Diagnosis not present

## 2020-10-12 DIAGNOSIS — Z8673 Personal history of transient ischemic attack (TIA), and cerebral infarction without residual deficits: Secondary | ICD-10-CM | POA: Diagnosis not present

## 2020-10-12 DIAGNOSIS — I2692 Saddle embolus of pulmonary artery without acute cor pulmonale: Secondary | ICD-10-CM | POA: Diagnosis not present

## 2020-10-14 ENCOUNTER — Other Ambulatory Visit: Payer: Self-pay | Admitting: Family Medicine

## 2020-10-14 ENCOUNTER — Telehealth: Payer: Self-pay | Admitting: Pharmacist

## 2020-10-14 DIAGNOSIS — Z8673 Personal history of transient ischemic attack (TIA), and cerebral infarction without residual deficits: Secondary | ICD-10-CM | POA: Diagnosis not present

## 2020-10-14 DIAGNOSIS — I2692 Saddle embolus of pulmonary artery without acute cor pulmonale: Secondary | ICD-10-CM | POA: Diagnosis not present

## 2020-10-14 DIAGNOSIS — E1122 Type 2 diabetes mellitus with diabetic chronic kidney disease: Secondary | ICD-10-CM | POA: Diagnosis not present

## 2020-10-14 DIAGNOSIS — K219 Gastro-esophageal reflux disease without esophagitis: Secondary | ICD-10-CM | POA: Diagnosis not present

## 2020-10-14 DIAGNOSIS — Z9181 History of falling: Secondary | ICD-10-CM | POA: Diagnosis not present

## 2020-10-14 DIAGNOSIS — R778 Other specified abnormalities of plasma proteins: Secondary | ICD-10-CM | POA: Diagnosis not present

## 2020-10-14 DIAGNOSIS — I674 Hypertensive encephalopathy: Secondary | ICD-10-CM

## 2020-10-14 DIAGNOSIS — F32A Depression, unspecified: Secondary | ICD-10-CM | POA: Diagnosis not present

## 2020-10-14 DIAGNOSIS — Z7901 Long term (current) use of anticoagulants: Secondary | ICD-10-CM | POA: Diagnosis not present

## 2020-10-14 DIAGNOSIS — Z79899 Other long term (current) drug therapy: Secondary | ICD-10-CM | POA: Diagnosis not present

## 2020-10-14 DIAGNOSIS — E785 Hyperlipidemia, unspecified: Secondary | ICD-10-CM | POA: Diagnosis not present

## 2020-10-14 DIAGNOSIS — I129 Hypertensive chronic kidney disease with stage 1 through stage 4 chronic kidney disease, or unspecified chronic kidney disease: Secondary | ICD-10-CM | POA: Diagnosis not present

## 2020-10-14 DIAGNOSIS — N1831 Chronic kidney disease, stage 3a: Secondary | ICD-10-CM | POA: Diagnosis not present

## 2020-10-14 NOTE — Chronic Care Management (AMB) (Signed)
Date- Patient called to remind of appointment with Watt Climes on 07.21.2022 at 12:30 pm  Patient aware of appointment date, time, and type of appointment ( telephone). Patient aware to have/bring all medications, supplements, blood pressure and/or blood sugar logs to visit.  Questions: Have you had any recent office visit or specialist visit outside of [place]? No Are there any concerns you would like to discuss during your office visit? No Are you having any problems obtaining your medications? (Whether it pharmacy issues or cost) No  Care Gaps: COVID  Star Rating Drug: Medication Dispensed  Quantity Pharmacy  Olmesartan 20 mg 06.06.2022 [facility]    Any gaps in medications fill history?   Maia Breslow, [place] Pharmacist Assistant [phone]

## 2020-10-15 ENCOUNTER — Other Ambulatory Visit: Payer: Self-pay | Admitting: Family Medicine

## 2020-10-15 ENCOUNTER — Ambulatory Visit (INDEPENDENT_AMBULATORY_CARE_PROVIDER_SITE_OTHER): Payer: Medicare Other | Admitting: Pharmacist

## 2020-10-15 DIAGNOSIS — E1169 Type 2 diabetes mellitus with other specified complication: Secondary | ICD-10-CM | POA: Diagnosis not present

## 2020-10-15 DIAGNOSIS — I674 Hypertensive encephalopathy: Secondary | ICD-10-CM

## 2020-10-15 DIAGNOSIS — E785 Hyperlipidemia, unspecified: Secondary | ICD-10-CM | POA: Diagnosis not present

## 2020-10-15 NOTE — Progress Notes (Signed)
Chronic Care Management Pharmacy Note  10/16/2020 Name:  Alexa Brown MRN:  [id number] DOB:  [birth year]  Summary: BP is at goal of < 140/90 at home LDL is not at goal < 70  Recommendations/Changes made from today's visit: -Recommended for patient to stop fish oil and separate vitamin D supplementation due to current amount from multivitamin -Recommend repeat lipid panel and uric acid level  Plan: Follow up in 4 months  Subjective: Alexa Brown is an 80 y.o. year old female who is a primary patient of Brown, Malka So, MD.  The CCM team was consulted for assistance with disease management and care coordination needs.    Engaged with patient by telephone for follow up visit in response to provider referral for pharmacy case management and/or care coordination services.   Consent to Services:  The patient was given information about Chronic Care Management services, agreed to services, and gave verbal consent prior to initiation of services.  Please see initial visit note for detailed documentation.   Patient Care Team: Brown, Jalaysia G, MD as PCP - General (Family Medicine) Viona Gilmore, [facility] as Pharmacist (Pharmacist)  Recent office visits: 08/31/20 Alexa Martinique, MD: Patient presented for HTN and depression follow up. D/c'd amlodipine. Increased Wellbutrin to 100 mg BID and started Zetia 10 mg daily.   07/30/20 Alexa Martinique, MD: Patient presented for hospital follow up. D/c'd almodipine-olmesartan and prescribed as separate agents and decreased amlodipine to 2.5 mg daily.  07/14/20 Alexa Martinique, MD: Patient presented for back pain. Due to cough, d/c'd amlodipine-benazepril and switched to amlodipine-olmesartan.  Recent consult visits: 09/21/20 Annice Pih, DO ([facility]): Patient presented for eye exam.  07/29/20 Acquanetta Sit, DPM (podiatry): Patient presented for nail debridement.  05/07/20 Jovita Kussmaul, MD ([facility]): Patient presented for eye  procedure.  04/14/20 Celesta Gentile, DPM (podiatry): Patient presented for nail debridement.  Hospital visits: Medication Reconciliation was completed by comparing discharge summary, patient's EMR and Pharmacy list, and upon discussion with patient.   Admitted to the hospital on 04.29.2022 due to Acute Pulmonary embolism . Discharge date was 05.02.2022. Discharged from [facility]?Medications Started at [facility] Discharge:?? -started the following medication due to acute pulmonary embolism Apixaban Starter Pack (93m and 555m (ELIQUIS STARTER PACK)   Medication Changes at Hospital Discharge: -None change   Medications Discontinued at Hospital Discharge: -Stopped following medication due to acute pulmonary embolism b complex vitamins capsule Clopidogrel 75 MG tablet (PLAVIX) L-Lysine 500 MG Caps Pravastatin 20 MG tablet (PRAVACHOL) Vitamin E 180 MG (400 UNITS) capsule   Medications that remain the same after Hospital Discharge:?? -All other medications will remain the same.    Objective:  Lab Results  Component Value Date   CREATININE 1.07 07/30/2020   BUN 26 (H) 07/30/2020   GFR 49.89 (L) 07/30/2020   GFRNONAA >60 07/27/2020   GFRAA 63 02/11/2020   NA 137 07/30/2020   K 4.6 07/30/2020   CALCIUM 10.5 07/30/2020   CO2 24 07/30/2020   GLUCOSE 107 (H) 07/30/2020    Lab Results  Component Value Date/Time   HGBA1C 6.2 07/14/2020 09:17 AM   HGBA1C 6.0 (H) 02/11/2020 09:05 AM   HGBA1C 5.7 07/27/2015 12:00 AM   HGBA1C 5.9 03/27/2015 12:00 AM   FRUCTOSAMINE 253 12/18/2018 09:23 AM   GFR 49.89 (L) 07/30/2020 11:58 AM   GFR 52.24 (L) 07/14/2020 09:17 AM   MICROALBUR 1.1 07/14/2020 09:17 AM   MICROALBUR 0.8 01/01/2019 10:56 AM  Last diabetic Eye exam: No results found for: HMDIABEYEEXA  Last diabetic Foot exam: No results found for: HMDIABFOOTEX   Lab Results  Component Value Date   CHOL 263 (H) 02/11/2020   HDL 77 02/11/2020   LDLCALC  164 (H) 02/11/2020   TRIG 106 02/11/2020   CHOLHDL 3.4 02/11/2020    Hepatic Function Latest Ref Rng & Units 07/27/2020 07/26/2020 07/25/2020  Total Protein 6.5 - 8.1 Brown/dL - - 6.4(L)  Albumin 3.5 - 5.0 Brown/dL 3.4(L) 3.6 3.4(L)  AST 15 - 41 U/L - - 18  ALT 0 - 44 U/L - - 14  Alk Phosphatase 38 - 126 U/L - - 45  Total Bilirubin 0.3 - 1.2 mg/dL - - 0.2(L)    Lab Results  Component Value Date/Time   TSH 2.87 12/18/2018 09:23 AM   TSH 1.41 10/30/2017 12:00 AM    CBC Latest Ref Rng & Units 07/30/2020 07/27/2020 07/26/2020  WBC 4.0 - 10.5 K/uL 5.3 5.2 6.4  Hemoglobin 12.0 - 15.0 Brown/dL 14.4 13.6 14.0  Hematocrit 36.0 - 46.0 % 42.8 41.1 42.0  Platelets 150.0 - 400.0 K/uL 253.0 223 219    Lab Results  Component Value Date/Time   VD25OH 67 07/21/2017 04:02 PM   VD25OH 43.34 01/23/2017 01:08 PM   VD25OH 54.05 01/14/2016 03:49 PM    Clinical ASCVD: Yes  The ASCVD Risk score Mikey Bussing DC Jr., et al., 2013) failed to calculate for the following reasons:   The patient has a prior MI or stroke diagnosis    Depression screen [facility] 2/9 08/31/2020 02/05/2020 08/07/2019  Decreased Interest 3 0 3  Down, Depressed, Hopeless 2 0 2  PHQ - 2 Score 5 0 5  Altered sleeping 3 - 2  Tired, decreased energy 3 - 3  Change in appetite 2 - 3  Feeling bad or failure about yourself  3 - 3  Trouble concentrating 1 - 2  Moving slowly or fidgety/restless 0 - 1  Suicidal thoughts 0 - 1  PHQ-9 Score 17 - 20  Difficult doing work/chores Somewhat difficult - Somewhat difficult      Social History   Tobacco Use  Smoking Status Never  Smokeless Tobacco Never   BP Readings from Last 3 Encounters:  08/31/20 128/80  07/30/20 106/62  07/27/20 (!) 119/52   Pulse Readings from Last 3 Encounters:  08/31/20 91  07/30/20 81  07/27/20 72   Wt Readings from Last 3 Encounters:  08/31/20 160 lb 2 oz (72.6 kg)  07/30/20 156 lb 12.8 oz (71.1 kg)  07/24/20 156 lb 8.4 oz (71 kg)   BMI Readings from Last 3 Encounters:   08/31/20 25.08 kg/m  07/30/20 24.56 kg/m  07/24/20 24.52 kg/m    Assessment/Interventions: Review of patient past medical history, allergies, medications, health status, including review of consultants reports, laboratory and other test data, was performed as part of comprehensive evaluation and provision of chronic care management services.   SDOH:  (Social Determinants of Health) assessments and interventions performed: No  SDOH Screenings   Alcohol Screen: Not on file  Depression (PHQ2-9): Medium Risk   PHQ-2 Score: 17  Financial Resource Strain: Low Risk    Difficulty of Paying Living Expenses: Not hard at all  Food Insecurity: No Food Insecurity   Worried About Charity fundraiser in the Last Year: Never true   Ran Out of Food in the Last Year: Never true  Housing: Low Risk    Last Housing Risk Score: 0  Physical  Activity: Insufficiently Active   Days of Exercise per Week: 2 days   Minutes of Exercise per Session: 30 min  Social Connections: Moderately Integrated   Frequency of Communication with Friends and Family: More than three times a week   Frequency of Social Gatherings with Friends and Family: Once a week   Attends Religious Services: More than 4 times per year   Active Member of [employer] or Organizations: Yes   Attends Archivist Meetings: 1 to 4 times per year   Marital Status: Widowed  Stress: No Stress Concern Present   Feeling of Stress : Not at all  Tobacco Use: Low Risk    Smoking Tobacco Use: Never   Smokeless Tobacco Use: Never  Transportation Needs: No Transportation Needs   Lack of Transportation (Medical): No   Lack of Transportation (Non-Medical): No    CCM Care Plan  No Known Allergies  Medications Reviewed Today     Reviewed by Viona Gilmore, [facility] (Pharmacist) on 10/15/20 at 1249  Med List Status: <None>   Medication Order Taking? Sig Documenting Provider Last Dose Status Informant  ACCU-CHEK AVIVA PLUS test strip [id number]  No USE TO TEST BLOOD SUGAR ONCE DAILY Brown, Mirelle G, MD Taking Active Child  Accu-Chek Softclix Lancets lancets [id number] No Use to check blood sugar once daily Brown, Ethie G, MD Taking Active Child  acetaminophen (TYLENOL) 325 MG tablet [id number] No Take 650 mg by mouth every 6 (six) hours as needed for mild pain, fever or headache. [provider] Taking Active Child  allopurinol (ZYLOPRIM) 100 MG tablet [id number] No Take 1 tablet (100 mg total) by mouth 2 (two) times daily. Brown, Jacquette G, MD Taking Active Child  Alpha-Lipoic Acid 600 MG TABS [id number] No Take 600 mg by mouth daily. [provider] Taking Active Child  apixaban (ELIQUIS) 5 MG TABS tablet [id number] No Take 1 tablet (5 mg total) by mouth 2 (two) times daily. Start after completing the starter pack Mercy Riding, MD Taking Active   Discontinued 10/15/20 1245 (Completed Course)   buPROPion (WELLBUTRIN SR) 100 MG 12 hr tablet [id number]  Take 1 tablet (100 mg total) by mouth 2 (two) times daily. Brown, Chayla G, MD  Active   colchicine 0.6 MG tablet [id number] No TAKE ONE TABLET BY MOUTH TWICE A DAY  Patient taking differently: Take 0.6 mg by mouth 2 (two) times daily.   Brown, Cristalle G, MD Taking Active   DULoxetine (CYMBALTA) 60 MG capsule [id number] No TAKE ONE CAPSULE BY MOUTH DAILY  Patient taking differently: Take 60 mg by mouth daily.   Brown, Maureen G, MD Taking Active   ezetimibe (ZETIA) 10 MG tablet [id number]  Take 1 tablet (10 mg total) by mouth daily. Brown, Arleatha G, MD  Active   Homeopathic Products ([place] EX) [id number] No Apply 1 application topically daily as needed (foot cramps). [provider] Taking Active Child  Lancet Devices (ACCU-CHEK [facility]) lancets [id number] No Use to test blood sugar once a day. Brown, Isobel G, MD Taking Active Child  Multiple Vitamin (MULTIVITAMIN) capsule [id number] No Take 1 capsule by mouth daily.  [provider] Taking Active Child   olmesartan (BENICAR) 20 MG tablet [id number]  Take 1 tablet (20 mg total) by mouth daily. Brown, Accalia G, MD  Active   Discontinued 10/15/20 1249 (Completed Course) pantoprazole (PROTONIX) 20 MG tablet [id number] No TAKE ONE TABLET BY MOUTH DAILY Brown, Shakeitha G, MD Taking Active  Patient Active Problem List   Diagnosis Date Noted   (HFpEF) heart failure with preserved ejection fraction ([place]) 07/30/2020   Acute pulmonary embolism ([place]) 07/24/2020   Diplopia 04/09/2020   Epiretinal membrane (ERM) of left eye 04/09/2020   Pseudophakia of both eyes 04/09/2020   Myalgia due to statin 02/11/2020   Posterior capsular opacification of both eyes, obscuring vision 10/17/2019   Elbow dislocation 07/02/2019   Generalized osteoarthritis of multiple sites 06/26/2018   Incontinent of urine 04/28/2018   Gout, arthropathy 01/22/2018   Lower back pain 09/22/2017   Unstable gait 09/22/2017   Seborrheic dermatitis 01/23/2017   Seborrheic keratosis 09/27/2016   Falling asleep and waking up too early in the evening 08/19/2016   CKD (chronic kidney disease), stage III ([place]) 05/28/2016   GERD (gastroesophageal reflux disease) 02/25/2016   CVA (cerebral vascular accident) (HCC)-No residual deficit 02/12/2016   Glaucoma 02/12/2016   Depression, major, recurrent ([place]) 02/12/2016   Hyperlipidemia associated with type 2 diabetes mellitus ([place]) 02/12/2016   OSA (obstructive sleep apnea) 02/12/2016   Vitamin D deficiency 01/14/2016   DM (diabetes mellitus), type 2 with neurological complications ([place]) 81/19/1478   Systolic hypertension with cerebrovascular disease 01/14/2016   Peripheral neuropathy 01/14/2016   Acne rosacea 01/28/2013   Thyroid activity decreased 03/28/2009    Immunization History  Administered Date(s) Administered   Hepatitis B, adult 01/22/2018   Influenza Whole 02/01/2016   Influenza, High Dose Seasonal PF 01/14/2016, 11/17/2018   Influenza-Unspecified 01/12/2017,  12/26/2017, 12/13/2019   PFIZER(Purple Top)SARS-COV-2 Vaccination 05/03/2019, 05/28/2019, 01/11/2020   Tdap 12/24/2015   Zoster Recombinat (Shingrix) 08/16/2016, 01/12/2017    Conditions to be addressed/monitored:  Hypertension, Hyperlipidemia, Diabetes, GERD, Chronic Kidney Disease, Depression, Osteoarthritis and Gout  Conditions addressed this visit: Hypertension, hyperlipidemia, depression  Care Plan : CCM Pharmacy Care Plan  Updates made by Viona Gilmore, [place] since 10/16/2020 12:00 AM     Problem: Problem: Hypertension, Hyperlipidemia, Diabetes, GERD, Chronic Kidney Disease, Depression, Osteoarthritis and Gout      Long-Range Goal: Patient-Specific Goal   Start Date: 07/08/2020  Expected End Date: 07/08/2021  Recent Progress: On track  Priority: High  Note:   Current Barriers:  Unable to independently monitor therapeutic efficacy Unable to achieve control of cholesterol  Unable to maintain control of blood pressure  Pharmacist Clinical Goal(s):  Patient will achieve adherence to monitoring guidelines and medication adherence to achieve therapeutic efficacy achieve control of cholesterol as evidenced by next lipid panel maintain control of blood pressure as evidenced by home blood pressure readings  through collaboration with PharmD and provider.   Interventions: 1:1 collaboration with Brown, Tamana G, MD regarding development and update of comprehensive plan of care as evidenced by provider attestation and co-signature Inter-disciplinary care team collaboration (see longitudinal plan of care) Comprehensive medication review performed; medication list updated in electronic medical record  Hypertension (BP goal <140/90) -Controlled -Current treatment: Olmesartan 20 mg 1 tablet daily -Medications previously tried: amlodipine (hypotension), benazepril (cough) -Current home readings: 120s/68, 120/70 (checking weekly with PT/OT) -Current dietary habits: mediterranean  diet -Current exercise habits: doing PT -Denies hypotensive/hypertensive symptoms -Educated on Importance of home blood pressure monitoring; Proper BP monitoring technique; Symptoms of hypotension and importance of maintaining adequate hydration; -Counseled to monitor BP at home a few times weekly, document, and provide log at future appointments -Counseled on diet and exercise extensively Recommended to continue current medication  Hyperlipidemia/CAD: (LDL goal < 70) -Uncontrolled -Current treatment: Clopidogrel 75 mg 1 tablet daily Zetia 10 mg 1  tablet daily -Medications previously tried: Atorvastatin, pravastatin (foot cramping, fear of side effects) -Current dietary patterns: restarted mediterranean diet; eating lots of vegetables and some fruit; no fried foods and cooking with olive oil -Current exercise habits: patient is doing PT exercises -Educated on Cholesterol goals;  Importance of limiting foods high in cholesterol; -Counseled on diet and exercise extensively Recommended to continue current medication Recommended repeat lipid panel at next office visit.  Diabetes (A1c goal <7%) -Controlled -Current medications: No medications -Medications previously tried: Invokana  -Current home glucose readings fasting glucose: did not discuss post prandial glucose: none -Denies hypoglycemic/hyperglycemic symptoms -Current meal patterns:  breakfast: did not discuss  lunch: did not discuss   dinner: did not discuss  snacks: did not discuss  drinks: did not discuss  -Current exercise: PT exercises -Educated on A1c and blood sugar goals; Exercise goal of 150 minutes per week; Benefits of routine self-monitoring of blood sugar; -Counseled to check feet daily and get yearly eye exams -Counseled on diet and exercise extensively  Depression/Anxiety (Goal: minimize symptoms) -Controlled -Current treatment: Wellbutrin 100 mg twice daily  Cymbalta 60 mg daily -Medications  previously tried/failed: none -PHQ9: 0 -GAD7: n/a -Educated on Benefits of medication for symptom control Benefits of cognitive-behavioral therapy with or without medication -Recommended to continue current medication Patient wants to follow up with psychiatrist and not therapist as this did not help in the past.  Insomnia (Goal: improve quality and quantity of sleep) -Controlled -Current treatment  Melatonin 10 mg QHS (does not take Fridays and saturdays) -Medications previously tried: none  -Recommended to continue current medication  Gout (Goal: uric acid < 6 and prevent flare ups) -Controlled -Current treatment  Allopurinol 100 mg twice daily  Colchicine twice daily -Medications previously tried: none  -Recommended repeat uric acid level Educated on differences between colchicine and allopurinol and colchicine should be used as needed  Bone health (Goal prevent fractures) -Controlled -Last DEXA Scan: 11/17/16              T-Score femoral neck: -0.5             T-Score total hip: n/a             T-Score lumbar spine: +0.0             T-Score forearm radius: n/a             10-year probability of major osteoporotic fracture: n/a             10-year probability of hip fracture: n/a -Patient is not a candidate for pharmacologic treatment -Current treatment  Calcium Carbonate 600 mg QHS + 800 units of D -Medications previously tried: none  -Recommend [phone] units of vitamin D daily. Recommend 1200 mg of calcium daily from dietary and supplemental sources. Recommend weight-bearing and muscle strengthening exercises for building and maintaining bone density. -Recommended to continue current medication  GERD (Goal: minimize symptoms) -Controlled -Current treatment  Pantoprazole 20 mg daily -Medications previously tried: none  -Recommended to continue current medication  VTE (Goal: prevent blood clot formation) -Controlled -Current treatment  Eliquis 5 mg 1 tablet twice  daily -Medications previously tried: none  -Recommended to continue current medication   Allergic rhinitis (Goal: minimize symptoms) -Controlled -Current treatment  Mucinex 600 mg 1 tablet daily -Medications previously tried: none  -Recommended to continue current medication   Health Maintenance -Vaccine gaps: none -Current therapy:  Voltaren 1% gel  L-Lysine 1000 mg daily (Cold sores)  Magnesium Citrate 100 mg  Miralax 17 Brown daily PRN Triamcinolone cream Vitamin E 400 units daily  NeuRemedy (Fenfotiamine 300 mg 1 tablet twice daily)  Equate Womens 50+ multivitamin daily Vitamin C 250 mg 2 chewables daily -Educated on Cost vs benefit of each product must be carefully weighed by individual consumer -Patient is satisfied with current therapy and denies issues -Recommended to continue current medication Recommended for patient to stop additional vitamin C, vitamin D and fish oil due to lack of benefit  Patient Goals/Self-Care Activities Patient will:  - take medications as prescribed check glucose daily, document, and provide at future appointments check blood pressure a few times weekly, document, and provide at future appointments target a minimum of 150 minutes of moderate intensity exercise weekly engage in dietary modifications by increasing fiber intake  Follow Up Plan: Telephone follow up appointment with care management team member scheduled for: 4 months        Medication Assistance: None required.  Patient affirms current coverage meets needs.  Compliance/Adherence/Medication fill history: Care Gaps: COVID booster  [facility]: Olmesartan 20 mg - last filled 08/31/20 for 90 ds at [facility]  Patient's preferred pharmacy is:  [facility] [id number] - [place], [place] [address] [place] [address] Phone: [phone] Fax: [phone]  [facility] [place], [place] - [facility]  [address] [place] [address] Phone: [phone] Fax: [phone]  Uses pill box? Yes - daughter fills for her Pt endorses 100% compliance  We discussed: Current pharmacy is preferred with insurance plan and patient is satisfied with pharmacy services Patient decided to: Continue current medication management strategy  Care Plan and Follow Up Patient Decision:  Patient agrees to Care Plan and Follow-up.  Plan: Telephone follow up appointment with care management team member scheduled for:  3 months  [name], PharmD [place] Pharmacist [place] [facility] [place] [phone]

## 2020-10-16 NOTE — Patient Instructions (Signed)
Hi [place],  It was great to get to speak with you again! Below is a summary of some of the topics we discussed.   Please reach out to me if you have any questions or need anything before our follow up!  Best, Maddie  [name], PharmD, [facility]   Visit Information   Goals Addressed   None    Patient Care Plan: CCM Pharmacy Care Plan     Problem Identified: Problem: Hypertension, Hyperlipidemia, Diabetes, GERD, Chronic Kidney Disease, Depression, Osteoarthritis and Gout      Long-Range Goal: Patient-Specific Goal   Start Date: 07/08/2020  Expected End Date: 07/08/2021  Recent Progress: On track  Priority: High  Note:   Current Barriers:  Unable to independently monitor therapeutic efficacy Unable to achieve control of cholesterol  Unable to maintain control of blood pressure  Pharmacist Clinical Goal(s):  Patient will achieve adherence to monitoring guidelines and medication adherence to achieve therapeutic efficacy achieve control of cholesterol as evidenced by next lipid panel maintain control of blood pressure as evidenced by home blood pressure readings  through collaboration with PharmD and provider.   Interventions: 1:1 collaboration with Martinique, Margel G, MD regarding development and update of comprehensive plan of care as evidenced by provider attestation and co-signature Inter-disciplinary care team collaboration (see longitudinal plan of care) Comprehensive medication review performed; medication list updated in electronic medical record  Hypertension (BP goal <140/90) -Controlled -Current treatment: Olmesartan 20 mg 1 tablet daily -Medications previously tried: amlodipine (hypotension), benazepril (cough) -Current home readings: 120s/68, 120/70 (checking weekly with PT/OT) -Current dietary habits: mediterranean diet -Current exercise habits: doing PT -Denies hypotensive/hypertensive  symptoms -Educated on Importance of home blood pressure monitoring; Proper BP monitoring technique; Symptoms of hypotension and importance of maintaining adequate hydration; -Counseled to monitor BP at home a few times weekly, document, and provide log at future appointments -Counseled on diet and exercise extensively Recommended to continue current medication  Hyperlipidemia/CAD: (LDL goal < 70) -Uncontrolled -Current treatment: Clopidogrel 75 mg 1 tablet daily Zetia 10 mg 1 tablet daily -Medications previously tried: Atorvastatin, pravastatin (foot cramping, fear of side effects) -Current dietary patterns: restarted mediterranean diet; eating lots of vegetables and some fruit; no fried foods and cooking with olive oil -Current exercise habits: patient is doing PT exercises -Educated on Cholesterol goals;  Importance of limiting foods high in cholesterol; -Counseled on diet and exercise extensively Recommended to continue current medication Recommended repeat lipid panel at next office visit.  Diabetes (A1c goal <7%) -Controlled -Current medications: No medications -Medications previously tried: Invokana  -Current home glucose readings fasting glucose: did not discuss post prandial glucose: none -Denies hypoglycemic/hyperglycemic symptoms -Current meal patterns:  breakfast: did not discuss  lunch: did not discuss   dinner: did not discuss  snacks: did not discuss  drinks: did not discuss  -Current exercise: PT exercises -Educated on A1c and blood sugar goals; Exercise goal of 150 minutes per week; Benefits of routine self-monitoring of blood sugar; -Counseled to check feet daily and get yearly eye exams -Counseled on diet and exercise extensively  Depression/Anxiety (Goal: minimize symptoms) -Controlled -Current treatment: Wellbutrin 100 mg twice daily  Cymbalta 60 mg daily -Medications previously tried/failed: none -PHQ9: 0 -GAD7: n/a -Educated on Benefits of  medication for symptom control Benefits of cognitive-behavioral therapy with or without medication -Recommended to continue current medication Patient wants to follow up with psychiatrist and not therapist as this did not help in the past.  Insomnia (  Goal: improve quality and quantity of sleep) -Controlled -Current treatment  Melatonin 10 mg QHS (does not take Fridays and saturdays) -Medications previously tried: none  -Recommended to continue current medication  Gout (Goal: uric acid < 6 and prevent flare ups) -Controlled -Current treatment  Allopurinol 100 mg twice daily  Colchicine twice daily -Medications previously tried: none  -Recommended repeat uric acid level Educated on differences between colchicine and allopurinol and colchicine should be used as needed  Bone health (Goal prevent fractures) -Controlled -Last DEXA Scan: 11/17/16              T-Score femoral neck: -0.5             T-Score total hip: n/a             T-Score lumbar spine: +0.0             T-Score forearm radius: n/a             10-year probability of major osteoporotic fracture: n/a             10-year probability of hip fracture: n/a -Patient is not a candidate for pharmacologic treatment -Current treatment  Calcium Carbonate 600 mg QHS + 800 units of D -Medications previously tried: none  -Recommend [phone] units of vitamin D daily. Recommend 1200 mg of calcium daily from dietary and supplemental sources. Recommend weight-bearing and muscle strengthening exercises for building and maintaining bone density. -Recommended to continue current medication  GERD (Goal: minimize symptoms) -Controlled -Current treatment  Pantoprazole 20 mg daily -Medications previously tried: none  -Recommended to continue current medication  VTE (Goal: prevent blood clot formation) -Controlled -Current treatment  Eliquis 5 mg 1 tablet twice daily -Medications previously tried: none  -Recommended to continue current  medication   Allergic rhinitis (Goal: minimize symptoms) -Controlled -Current treatment  Mucinex 600 mg 1 tablet daily -Medications previously tried: none  -Recommended to continue current medication   Health Maintenance -Vaccine gaps: none -Current therapy:  Voltaren 1% gel  L-Lysine 1000 mg daily (Cold sores)  Magnesium Citrate 100 mg  Miralax 17 g daily PRN Triamcinolone cream Vitamin E 400 units daily  NeuRemedy (Fenfotiamine 300 mg 1 tablet twice daily)  Equate Womens 50+ multivitamin daily Vitamin C 250 mg 2 chewables daily -Educated on Cost vs benefit of each product must be carefully weighed by individual consumer -Patient is satisfied with current therapy and denies issues -Recommended to continue current medication Recommended for patient to stop additional vitamin C, vitamin D and fish oil due to lack of benefit  Patient Goals/Self-Care Activities Patient will:  - take medications as prescribed check glucose daily, document, and provide at future appointments check blood pressure a few times weekly, document, and provide at future appointments target a minimum of 150 minutes of moderate intensity exercise weekly engage in dietary modifications by increasing fiber intake  Follow Up Plan: Telephone follow up appointment with care management team member scheduled for: 4 months       Patient verbalizes understanding of instructions provided today and agrees to view in [place].  Telephone follow up appointment with pharmacy team member scheduled for: 4 months  Viona Gilmore, [facility]

## 2020-10-19 DIAGNOSIS — E785 Hyperlipidemia, unspecified: Secondary | ICD-10-CM | POA: Diagnosis not present

## 2020-10-19 DIAGNOSIS — F32A Depression, unspecified: Secondary | ICD-10-CM | POA: Diagnosis not present

## 2020-10-19 DIAGNOSIS — K219 Gastro-esophageal reflux disease without esophagitis: Secondary | ICD-10-CM | POA: Diagnosis not present

## 2020-10-19 DIAGNOSIS — Z9181 History of falling: Secondary | ICD-10-CM | POA: Diagnosis not present

## 2020-10-19 DIAGNOSIS — E1122 Type 2 diabetes mellitus with diabetic chronic kidney disease: Secondary | ICD-10-CM | POA: Diagnosis not present

## 2020-10-19 DIAGNOSIS — Z8673 Personal history of transient ischemic attack (TIA), and cerebral infarction without residual deficits: Secondary | ICD-10-CM | POA: Diagnosis not present

## 2020-10-19 DIAGNOSIS — Z79899 Other long term (current) drug therapy: Secondary | ICD-10-CM | POA: Diagnosis not present

## 2020-10-19 DIAGNOSIS — I129 Hypertensive chronic kidney disease with stage 1 through stage 4 chronic kidney disease, or unspecified chronic kidney disease: Secondary | ICD-10-CM | POA: Diagnosis not present

## 2020-10-19 DIAGNOSIS — R778 Other specified abnormalities of plasma proteins: Secondary | ICD-10-CM | POA: Diagnosis not present

## 2020-10-19 DIAGNOSIS — N1831 Chronic kidney disease, stage 3a: Secondary | ICD-10-CM | POA: Diagnosis not present

## 2020-10-19 DIAGNOSIS — I2692 Saddle embolus of pulmonary artery without acute cor pulmonale: Secondary | ICD-10-CM | POA: Diagnosis not present

## 2020-10-19 DIAGNOSIS — Z7901 Long term (current) use of anticoagulants: Secondary | ICD-10-CM | POA: Diagnosis not present

## 2020-10-23 DIAGNOSIS — I129 Hypertensive chronic kidney disease with stage 1 through stage 4 chronic kidney disease, or unspecified chronic kidney disease: Secondary | ICD-10-CM | POA: Diagnosis not present

## 2020-10-23 DIAGNOSIS — F32A Depression, unspecified: Secondary | ICD-10-CM | POA: Diagnosis not present

## 2020-10-23 DIAGNOSIS — Z7901 Long term (current) use of anticoagulants: Secondary | ICD-10-CM | POA: Diagnosis not present

## 2020-10-23 DIAGNOSIS — R778 Other specified abnormalities of plasma proteins: Secondary | ICD-10-CM | POA: Diagnosis not present

## 2020-10-23 DIAGNOSIS — Z8673 Personal history of transient ischemic attack (TIA), and cerebral infarction without residual deficits: Secondary | ICD-10-CM | POA: Diagnosis not present

## 2020-10-23 DIAGNOSIS — K219 Gastro-esophageal reflux disease without esophagitis: Secondary | ICD-10-CM | POA: Diagnosis not present

## 2020-10-23 DIAGNOSIS — Z79899 Other long term (current) drug therapy: Secondary | ICD-10-CM | POA: Diagnosis not present

## 2020-10-23 DIAGNOSIS — N1831 Chronic kidney disease, stage 3a: Secondary | ICD-10-CM | POA: Diagnosis not present

## 2020-10-23 DIAGNOSIS — I2692 Saddle embolus of pulmonary artery without acute cor pulmonale: Secondary | ICD-10-CM | POA: Diagnosis not present

## 2020-10-23 DIAGNOSIS — E1122 Type 2 diabetes mellitus with diabetic chronic kidney disease: Secondary | ICD-10-CM | POA: Diagnosis not present

## 2020-10-23 DIAGNOSIS — E785 Hyperlipidemia, unspecified: Secondary | ICD-10-CM | POA: Diagnosis not present

## 2020-10-23 DIAGNOSIS — Z9181 History of falling: Secondary | ICD-10-CM | POA: Diagnosis not present

## 2020-10-28 ENCOUNTER — Telehealth: Payer: Self-pay

## 2020-10-28 DIAGNOSIS — Z7901 Long term (current) use of anticoagulants: Secondary | ICD-10-CM | POA: Diagnosis not present

## 2020-10-28 DIAGNOSIS — F32A Depression, unspecified: Secondary | ICD-10-CM | POA: Diagnosis not present

## 2020-10-28 DIAGNOSIS — Z9181 History of falling: Secondary | ICD-10-CM | POA: Diagnosis not present

## 2020-10-28 DIAGNOSIS — K219 Gastro-esophageal reflux disease without esophagitis: Secondary | ICD-10-CM | POA: Diagnosis not present

## 2020-10-28 DIAGNOSIS — E785 Hyperlipidemia, unspecified: Secondary | ICD-10-CM | POA: Diagnosis not present

## 2020-10-28 DIAGNOSIS — R778 Other specified abnormalities of plasma proteins: Secondary | ICD-10-CM | POA: Diagnosis not present

## 2020-10-28 DIAGNOSIS — Z79899 Other long term (current) drug therapy: Secondary | ICD-10-CM | POA: Diagnosis not present

## 2020-10-28 DIAGNOSIS — Z8673 Personal history of transient ischemic attack (TIA), and cerebral infarction without residual deficits: Secondary | ICD-10-CM | POA: Diagnosis not present

## 2020-10-28 DIAGNOSIS — N1831 Chronic kidney disease, stage 3a: Secondary | ICD-10-CM | POA: Diagnosis not present

## 2020-10-28 DIAGNOSIS — E1122 Type 2 diabetes mellitus with diabetic chronic kidney disease: Secondary | ICD-10-CM | POA: Diagnosis not present

## 2020-10-28 DIAGNOSIS — I129 Hypertensive chronic kidney disease with stage 1 through stage 4 chronic kidney disease, or unspecified chronic kidney disease: Secondary | ICD-10-CM | POA: Diagnosis not present

## 2020-10-28 DIAGNOSIS — I2692 Saddle embolus of pulmonary artery without acute cor pulmonale: Secondary | ICD-10-CM | POA: Diagnosis not present

## 2020-10-28 NOTE — Telephone Encounter (Signed)
Henderson Newcomer from [facility] is requesting continued therapy 1x 4 weeks, call back # [phone]

## 2020-10-28 NOTE — Telephone Encounter (Signed)
PT called to advise that she is returning the call from Judson Roch

## 2020-10-28 NOTE — Telephone Encounter (Signed)
Verbal left on Alexa Brown's voicemail.

## 2020-10-30 NOTE — Progress Notes (Signed)
HPI: Alexa Brown is a 80 y.o. female, who is here today for 2 months follow up.   She was last seen on 08/31/20. Depression and anxiety:  He is on Cymbalta 60 mg daily, which also has helped with peripheral neuropathy. Last visit Wellbutrin SR was increased from 75 mg twice daily to 100 mg twice daily. She is reporting improvement. Negative for suicidal thoughts.  Tolerating medication well.  Diabetes Mellitus II: Dx'ed in 2016. - Checking BG at home: 110-120's. - Medications: On non pharmacologic treatment. - Diet: Yes - Exercise: Not consistently, unstable gait. - eye exam: 08/2020. - foot exam: 07/2020.  - Negative for symptoms of hypoglycemia, polyuria, polydipsia, foot ulcers/trauma Feet tingling, "pins and needles", and burning feet. Stable symptoms, Cymbalta has helped.  Lab Results  Component Value Date   HGBA1C 6.2 07/14/2020   Lab Results  Component Value Date   MICROALBUR 1.1 07/14/2020   Hypertension:  Medications:Olmesartan 20 mg daily. Last visit Amlodipine was discontinued. BP readings at home:120's-130/60-80's. Side effects:None  Negative for unusual or severe headache, visual changes, exertional chest pain, dyspnea,  focal weakness, or edema.  Episodes of lightheadedness has improved. No falls since her last visit. Ongoing PT and OT, which have helped.  Lab Results  Component Value Date   CREATININE 1.07 07/30/2020   BUN 26 (H) 07/30/2020   NA 137 07/30/2020   K 4.6 07/30/2020   CL 105 07/30/2020   CO2 24 07/30/2020   HLD: She has not tolerated statins well. Zetia started last OV. She stopped omega 3 acid. She has tolerated Zetia 10 mg well. She is following low fat diet consistently.  Lab Results  Component Value Date   CHOL 263 (H) 02/11/2020   HDL 77 02/11/2020   LDLCALC 164 (H) 02/11/2020   TRIG 106 02/11/2020   CHOLHDL 3.4 02/11/2020   Review of Systems  Constitutional:  Negative for activity change, appetite  change, chills and fever.  HENT:  Negative for mouth sores, nosebleeds and sore throat.   Respiratory:  Negative for cough and wheezing.   Gastrointestinal:  Negative for abdominal pain, nausea and vomiting.       Negative for changes in bowel habits.  Genitourinary:  Negative for decreased urine volume, dysuria and hematuria.  Musculoskeletal:  Positive for arthralgias and gait problem.  Skin:  Negative for rash and wound.  Neurological:  Negative for syncope, weakness and headaches.  Rest of ROS, see pertinent positives sand negatives in HPI  Current Outpatient Medications on File Prior to Visit  Medication Sig Dispense Refill   ACCU-CHEK AVIVA PLUS test strip USE TO TEST BLOOD SUGAR ONCE DAILY 100 strip 1   Accu-Chek Softclix Lancets lancets Use to check blood sugar once daily 100 each 2   acetaminophen (TYLENOL) 325 MG tablet Take 650 mg by mouth every 6 (six) hours as needed for mild pain, fever or headache.     allopurinol (ZYLOPRIM) 100 MG tablet Take 1 tablet (100 mg total) by mouth 2 (two) times daily. 180 tablet 3   Alpha-Lipoic Acid 600 MG TABS Take 600 mg by mouth daily.     apixaban (ELIQUIS) 5 MG TABS tablet Take 1 tablet (5 mg total) by mouth 2 (two) times daily. Start after completing the starter pack 180 tablet 1   buPROPion (WELLBUTRIN SR) 100 MG 12 hr tablet Take 1 tablet (100 mg total) by mouth 2 (two) times daily. 60 tablet 2   colchicine 0.6 MG tablet TAKE  ONE TABLET BY MOUTH TWICE A DAY (Patient taking differently: Take 0.6 mg by mouth 2 (two) times daily.) 30 tablet 0   DULoxetine (CYMBALTA) 60 MG capsule TAKE ONE CAPSULE BY MOUTH DAILY (Patient taking differently: Take 60 mg by mouth daily.) 90 capsule 2   ezetimibe (ZETIA) 10 MG tablet Take 1 tablet (10 mg total) by mouth daily. 90 tablet 2   Homeopathic Products (THERAWORX RELIEF EX) Apply 1 application topically daily as needed (foot cramps).     Lancet Devices (ACCU-CHEK SOFTCLIX) lancets Use to test blood sugar  once a day. 100 each 2   Multiple Vitamin (MULTIVITAMIN) capsule Take 1 capsule by mouth daily.      olmesartan (BENICAR) 20 MG tablet Take 1 tablet (20 mg total) by mouth daily. 90 tablet 2   pantoprazole (PROTONIX) 20 MG tablet TAKE ONE TABLET BY MOUTH DAILY 90 tablet 2   No current facility-administered medications on file prior to visit.   Past Medical History:  Diagnosis Date   Chicken pox    Depression    Diabetes mellitus without complication (HCC)    GERD (gastroesophageal reflux disease)    Hyperlipidemia    Hypertension    Stroke [facility])    UTI (urinary tract infection)    No Known Allergies  Social History   Socioeconomic History   Marital status: Widowed    Spouse name: Not on file   Number of children: Not on file   Years of education: Not on file   Highest education level: Not on file  Occupational History   Occupation: N/A  Tobacco Use   Smoking status: Never   Smokeless tobacco: Never  Vaping Use   Vaping Use: Never used  Substance and Sexual Activity   Alcohol use: Yes    Comment: occassional wine   Drug use: No   Sexual activity: Not Currently  Other Topics Concern   Not on file  Social History Narrative   Pt lives in single story home with her daughter   Has 2 children   Some college education   Retired Engineer, production for [employer]   Social Determinants of Health   Financial Resource Strain: Low Risk    Difficulty of Paying Living Expenses: Not hard at all  Food Insecurity: No Food Insecurity   Worried About Charity fundraiser in the Last Year: Never true   Arboriculturist in the Last Year: Never true  Transportation Needs: No Transportation Needs   Lack of Transportation (Medical): No   Lack of Transportation (Non-Medical): No  Physical Activity: Insufficiently Active   Days of Exercise per Week: 2 days   Minutes of Exercise per Session: 30 min  Stress: No Stress Concern Present   Feeling of Stress : Not at all  Social  Connections: Moderately Integrated   Frequency of Communication with Friends and Family: More than three times a week   Frequency of Social Gatherings with Friends and Family: Once a week   Attends Religious Services: More than 4 times per year   Active Member of [employer] or Organizations: Yes   Attends Archivist Meetings: 1 to 4 times per year   Marital Status: Widowed   Vitals:   11/02/20 0828  BP: 136/70  Pulse: 87  Resp: 16  SpO2: 97%   Body mass index is 25.69 kg/m.  Physical Exam Vitals and nursing note reviewed.  Constitutional:      General: She is not in acute distress.  Appearance: She is well-developed.  HENT:     Head: Normocephalic and atraumatic.     Mouth/Throat:     Mouth: Mucous membranes are moist.     Pharynx: Oropharynx is clear.  Eyes:     Conjunctiva/sclera: Conjunctivae normal.  Cardiovascular:     Rate and Rhythm: Normal rate and regular rhythm.     Pulses:          Posterior tibial pulses are 2+ on the right side and 2+ on the left side.     Heart sounds: No murmur heard. Pulmonary:     Effort: Pulmonary effort is normal. No respiratory distress.     Breath sounds: Normal breath sounds.  Abdominal:     Palpations: Abdomen is soft.     Tenderness: There is no abdominal tenderness.  Skin:    General: Skin is warm.     Findings: No erythema or rash.  Neurological:     General: No focal deficit present.     Mental Status: She is alert and oriented to person, place, and time.     Cranial Nerves: No cranial nerve deficit.     Gait: Gait normal.     Comments: Unstable gait assisted with a cane.  Psychiatric:     Comments: Well groomed, good eye contact.   ASSESSMENT AND PLAN:  Alexa Brown was seen today for 2 months follow-up.  Orders Placed This Encounter  Procedures   POC HgB A1c   Lab Results  Component Value Date   HGBA1C 5.5 11/02/2020   Unstable gait Reporting some improvement with PT and  OT. Medications side effects reviewed and fall precautions discussed.  DM (diabetes mellitus), type 2 with neurological complications ([place]) [id number] at goal. Continue non pharmacologic treatment. Annual eye exam, periodic dental and foot care to continue. F/U in 5 months   Hyperlipidemia associated with type 2 diabetes mellitus (HCC) Tolerating Zetia 10 mg well, no changes today. Continue low fat diet. We will plan on checking FLP next visit.   Depression, major, recurrent ([place]) We discussed Dx and prognosis. Problem improved with increasing Wellbutrin dose, so continue Wellbutrin SR 100 mg bid and Cymbalta 60 mg daily.  Systolic hypertension with cerebrovascular disease BP adequately controlled. Continue current management: Olmesartan 20 mg daily. DASH/low salt diet to continue. Monitor BP at home. Eye exam up to date.  I spent a total of 41 minutes in both face to face and non face to face activities for this visit on the date of this encounter. During this time history was obtained and documented, examination was performed, prior labs reviewed, and assessment/plan discussed.  Return in about 5 months (around 04/06/2021) for DM II,HLD,HTN,depression.  Oscar G. Martinique, MD  [facility]. [place] office.

## 2020-11-02 ENCOUNTER — Encounter: Payer: Self-pay | Admitting: Family Medicine

## 2020-11-02 ENCOUNTER — Ambulatory Visit (INDEPENDENT_AMBULATORY_CARE_PROVIDER_SITE_OTHER): Payer: Medicare Other | Admitting: Podiatry

## 2020-11-02 ENCOUNTER — Ambulatory Visit (INDEPENDENT_AMBULATORY_CARE_PROVIDER_SITE_OTHER): Payer: Medicare Other | Admitting: Family Medicine

## 2020-11-02 ENCOUNTER — Encounter: Payer: Self-pay | Admitting: Podiatry

## 2020-11-02 ENCOUNTER — Other Ambulatory Visit: Payer: Self-pay

## 2020-11-02 VITALS — BP 136/70 | HR 87 | Resp 16 | Ht 67.0 in | Wt 164.0 lb

## 2020-11-02 DIAGNOSIS — E11610 Type 2 diabetes mellitus with diabetic neuropathic arthropathy: Secondary | ICD-10-CM

## 2020-11-02 DIAGNOSIS — L84 Corns and callosities: Secondary | ICD-10-CM | POA: Diagnosis not present

## 2020-11-02 DIAGNOSIS — I674 Hypertensive encephalopathy: Secondary | ICD-10-CM

## 2020-11-02 DIAGNOSIS — B351 Tinea unguium: Secondary | ICD-10-CM | POA: Diagnosis not present

## 2020-11-02 DIAGNOSIS — E1149 Type 2 diabetes mellitus with other diabetic neurological complication: Secondary | ICD-10-CM

## 2020-11-02 DIAGNOSIS — E1169 Type 2 diabetes mellitus with other specified complication: Secondary | ICD-10-CM

## 2020-11-02 DIAGNOSIS — R2681 Unsteadiness on feet: Secondary | ICD-10-CM

## 2020-11-02 DIAGNOSIS — E785 Hyperlipidemia, unspecified: Secondary | ICD-10-CM | POA: Diagnosis not present

## 2020-11-02 DIAGNOSIS — F331 Major depressive disorder, recurrent, moderate: Secondary | ICD-10-CM

## 2020-11-02 DIAGNOSIS — M79674 Pain in right toe(s): Secondary | ICD-10-CM | POA: Diagnosis not present

## 2020-11-02 DIAGNOSIS — M79675 Pain in left toe(s): Secondary | ICD-10-CM | POA: Diagnosis not present

## 2020-11-02 LAB — POCT GLYCOSYLATED HEMOGLOBIN (HGB A1C): Hemoglobin A1C: 5.5 % (ref 4.0–5.6)

## 2020-11-02 NOTE — Assessment & Plan Note (Signed)
We discussed Dx and prognosis. Problem improved with increasing Wellbutrin dose, so continue Wellbutrin SR 100 mg bid and Cymbalta 60 mg daily.

## 2020-11-02 NOTE — Assessment & Plan Note (Signed)
Reporting some improvement with PT and OT. Medications side effects reviewed and fall precautions discussed.

## 2020-11-02 NOTE — Assessment & Plan Note (Signed)
BP adequately controlled. Continue current management: Olmesartan 20 mg daily. DASH/low salt diet to continue. Monitor BP at home. Eye exam up to date.

## 2020-11-02 NOTE — Assessment & Plan Note (Signed)
Tolerating Zetia 10 mg well, no changes today. Continue low fat diet. We will plan on checking FLP next visit.

## 2020-11-02 NOTE — Assessment & Plan Note (Signed)
HgA1C at goal. Continue non pharmacologic treatment. Annual eye exam, periodic dental and foot care to continue. F/U in 5 months

## 2020-11-02 NOTE — Patient Instructions (Addendum)
A few things to remember from today's visit:  DM (diabetes mellitus), type 2 with neurological complications ([place])  Hyperlipidemia associated with type 2 diabetes mellitus ([place])  Moderate episode of recurrent major depressive disorder ([place])  If you need refills please call your pharmacy. Do not use My Chart to request refills or for acute issues that need immediate attention.   No changes today. Continue monitoring blood pressure 2-3 per week. We will check cholesterol and kidney function next visit.  Please be sure medication list is accurate. If a new problem present, please set up appointment sooner than planned today.

## 2020-11-04 DIAGNOSIS — E1122 Type 2 diabetes mellitus with diabetic chronic kidney disease: Secondary | ICD-10-CM | POA: Diagnosis not present

## 2020-11-04 DIAGNOSIS — E785 Hyperlipidemia, unspecified: Secondary | ICD-10-CM | POA: Diagnosis not present

## 2020-11-04 DIAGNOSIS — Z8673 Personal history of transient ischemic attack (TIA), and cerebral infarction without residual deficits: Secondary | ICD-10-CM | POA: Diagnosis not present

## 2020-11-04 DIAGNOSIS — Z79899 Other long term (current) drug therapy: Secondary | ICD-10-CM | POA: Diagnosis not present

## 2020-11-04 DIAGNOSIS — F32A Depression, unspecified: Secondary | ICD-10-CM | POA: Diagnosis not present

## 2020-11-04 DIAGNOSIS — N1831 Chronic kidney disease, stage 3a: Secondary | ICD-10-CM | POA: Diagnosis not present

## 2020-11-04 DIAGNOSIS — K219 Gastro-esophageal reflux disease without esophagitis: Secondary | ICD-10-CM | POA: Diagnosis not present

## 2020-11-04 DIAGNOSIS — I2692 Saddle embolus of pulmonary artery without acute cor pulmonale: Secondary | ICD-10-CM | POA: Diagnosis not present

## 2020-11-04 DIAGNOSIS — I129 Hypertensive chronic kidney disease with stage 1 through stage 4 chronic kidney disease, or unspecified chronic kidney disease: Secondary | ICD-10-CM | POA: Diagnosis not present

## 2020-11-04 DIAGNOSIS — Z7901 Long term (current) use of anticoagulants: Secondary | ICD-10-CM | POA: Diagnosis not present

## 2020-11-04 DIAGNOSIS — R778 Other specified abnormalities of plasma proteins: Secondary | ICD-10-CM | POA: Diagnosis not present

## 2020-11-04 DIAGNOSIS — Z9181 History of falling: Secondary | ICD-10-CM | POA: Diagnosis not present

## 2020-11-05 NOTE — Progress Notes (Signed)
Subjective: Alexa Brown is a pleasant 80 y.o. female patient seen today painful thick toenails that are difficult to trim. Pain interferes with ambulation. Aggravating factors include wearing enclosed shoe gear. Pain is relieved with periodic professional debridement.  Patient has h/o diabetes. She doesn't check her blood glucose daily.  PCP is Martinique, Talana G, MD. Last visit was: 11/02/2020.  No Known Allergies  Objective: Physical Exam  General: Alexa Brown is a pleasant 80 y.o. Caucasian female, WD, WN in NAD. AAO x 3.   Vascular:  Capillary refill time to digits immediate b/l. Palpable pedal pulses b/l LE. Pedal hair sparse. Lower extremity skin temperature gradient within normal limits. No pain with calf compression b/l. No edema noted b/l lower extremities.  Dermatological:  Pedal skin with normal turgor, texture and tone b/l lower extremities. No open wounds b/l lower extremities. No interdigital macerations b/l lower extremities. Toenails 1-5 b/l elongated, discolored, dystrophic, thickened, crumbly with subungual debris and tenderness to dorsal palpation. Hyperkeratotic lesion(s) R hallux.  No erythema, no edema, no drainage, no fluctuance.  Musculoskeletal:  Normal muscle strength 5/5 to all lower extremity muscle groups bilaterally. No pain crepitus or joint limitation noted with ROM b/l lower extremities. No gross bony deformities b/l lower extremities.  Neurological:  Protective sensation intact 5/5 intact bilaterally with 10g monofilament b/l.  Assessment and Plan:  1. Pain due to onychomycosis of toenails of both feet   2. Callus   3. Type 2 diabetes mellitus with diabetic neuropathic arthropathy, unspecified whether long term insulin use ([place])      -Examined patient. -Continue diabetic foot care principles. -Patient to continue soft, supportive shoe gear daily. -Toenails 1-5 b/l were debrided in length and girth with sterile nail nippers and  dremel without iatrogenic bleeding.  -Callus(es) R hallux pared utilizing sterile scalpel blade without complication or incident. Total number debrided =1. -Patient to report any pedal injuries to medical professional immediately. -Patient/POA to call should there be question/concern in the interim.  Return in about 3 months (around 02/02/2021).  Marzetta Board, DPM

## 2020-11-06 ENCOUNTER — Telehealth: Payer: Self-pay | Admitting: Pharmacist

## 2020-11-06 DIAGNOSIS — L821 Other seborrheic keratosis: Secondary | ICD-10-CM | POA: Diagnosis not present

## 2020-11-06 DIAGNOSIS — L820 Inflamed seborrheic keratosis: Secondary | ICD-10-CM | POA: Diagnosis not present

## 2020-11-06 DIAGNOSIS — E1122 Type 2 diabetes mellitus with diabetic chronic kidney disease: Secondary | ICD-10-CM | POA: Diagnosis not present

## 2020-11-06 DIAGNOSIS — L218 Other seborrheic dermatitis: Secondary | ICD-10-CM | POA: Diagnosis not present

## 2020-11-06 DIAGNOSIS — R778 Other specified abnormalities of plasma proteins: Secondary | ICD-10-CM | POA: Diagnosis not present

## 2020-11-06 DIAGNOSIS — E785 Hyperlipidemia, unspecified: Secondary | ICD-10-CM | POA: Diagnosis not present

## 2020-11-06 DIAGNOSIS — F32A Depression, unspecified: Secondary | ICD-10-CM | POA: Diagnosis not present

## 2020-11-06 DIAGNOSIS — Z8673 Personal history of transient ischemic attack (TIA), and cerebral infarction without residual deficits: Secondary | ICD-10-CM | POA: Diagnosis not present

## 2020-11-06 DIAGNOSIS — I2692 Saddle embolus of pulmonary artery without acute cor pulmonale: Secondary | ICD-10-CM | POA: Diagnosis not present

## 2020-11-06 DIAGNOSIS — Z7901 Long term (current) use of anticoagulants: Secondary | ICD-10-CM | POA: Diagnosis not present

## 2020-11-06 DIAGNOSIS — Z9181 History of falling: Secondary | ICD-10-CM | POA: Diagnosis not present

## 2020-11-06 DIAGNOSIS — K219 Gastro-esophageal reflux disease without esophagitis: Secondary | ICD-10-CM | POA: Diagnosis not present

## 2020-11-06 DIAGNOSIS — Z79899 Other long term (current) drug therapy: Secondary | ICD-10-CM | POA: Diagnosis not present

## 2020-11-06 DIAGNOSIS — D1801 Hemangioma of skin and subcutaneous tissue: Secondary | ICD-10-CM | POA: Diagnosis not present

## 2020-11-06 DIAGNOSIS — N1831 Chronic kidney disease, stage 3a: Secondary | ICD-10-CM | POA: Diagnosis not present

## 2020-11-06 DIAGNOSIS — I129 Hypertensive chronic kidney disease with stage 1 through stage 4 chronic kidney disease, or unspecified chronic kidney disease: Secondary | ICD-10-CM | POA: Diagnosis not present

## 2020-11-06 NOTE — Progress Notes (Signed)
Chronic Care Management Pharmacy Assistant   Name: Alexa Brown  MRN: [id number] DOB: Jul 11, 1940   Reason for Encounter: Disease State   Conditions to be addressed/monitored: HTN  Recent office visits:  11-02-2020 [place], Artelia G, MD - Patient presented for DM (diabetes mellitus), type 2 with neurological complications and other concerns. No medication changes.  Recent consult visits:  11-02-2020 Marzetta Board, DPM - Patient presented for Pain due to onychomycosis of toenails of both feet. No medication changes.   Hospital visits:  Medication Reconciliation was completed by comparing discharge summary, patient's EMR and Pharmacy list, and upon discussion with patient.  Admitted to the hospital on 04.29.2022 due to Acute Pulmonary embolism . Discharge date was 05.02.2022. Discharged from [place]?Medications Started at [facility] Discharge:?? -started the following medication due to acute pulmonary embolism Apixaban Starter Pack ('10mg'$  and '5mg'$ ) (ELIQUIS STARTER PACK)   Medication Changes at Hospital Discharge: -None change   Medications Discontinued at Hospital Discharge: -Stopped following medication due to acute pulmonary embolism b complex vitamins capsule Clopidogrel 75 MG tablet (PLAVIX) L-Lysine 500 MG Caps Pravastatin 20 MG tablet (PRAVACHOL) Vitamin E 180 MG (400 UNITS) capsule   Medications that remain the same after Hospital Discharge:??  -All other medications will remain the same.    Medications: Outpatient Encounter Medications as of 11/06/2020  Medication Sig   ACCU-CHEK AVIVA PLUS test strip USE TO TEST BLOOD SUGAR ONCE DAILY   Accu-Chek Softclix Lancets lancets Use to check blood sugar once daily   acetaminophen (TYLENOL) 325 MG tablet Take 650 mg by mouth every 6 (six) hours as needed for mild pain, fever or headache.   allopurinol (ZYLOPRIM) 100 MG tablet Take 1 tablet (100 mg total) by mouth 2 (two) times  daily.   Alpha-Lipoic Acid 600 MG TABS Take 600 mg by mouth daily.   apixaban (ELIQUIS) 5 MG TABS tablet Take 1 tablet (5 mg total) by mouth 2 (two) times daily. Start after completing the starter pack   buPROPion (WELLBUTRIN SR) 100 MG 12 hr tablet Take 1 tablet (100 mg total) by mouth 2 (two) times daily.   colchicine 0.6 MG tablet TAKE ONE TABLET BY MOUTH TWICE A DAY (Patient taking differently: Take 0.6 mg by mouth 2 (two) times daily.)   DULoxetine (CYMBALTA) 60 MG capsule TAKE ONE CAPSULE BY MOUTH DAILY (Patient taking differently: Take 60 mg by mouth daily.)   ezetimibe (ZETIA) 10 MG tablet Take 1 tablet (10 mg total) by mouth daily.   Homeopathic Products [facility] RELIEF EX) Apply 1 application topically daily as needed (foot cramps).   Lancet Devices (ACCU-CHEK SOFTCLIX) lancets Use to test blood sugar once a day.   Multiple Vitamin (MULTIVITAMIN) capsule Take 1 capsule by mouth daily.    olmesartan (BENICAR) 20 MG tablet Take 1 tablet (20 mg total) by mouth daily.   pantoprazole (PROTONIX) 20 MG tablet TAKE ONE TABLET BY MOUTH DAILY   No facility-administered encounter medications on file as of 11/06/2020.  Reviewed chart prior to disease state call. Spoke with patient regarding BP  Recent Office Vitals: BP Readings from Last 3 Encounters:  11/02/20 136/70  08/31/20 128/80  07/30/20 106/62   Pulse Readings from Last 3 Encounters:  11/02/20 87  08/31/20 91  07/30/20 81    Wt Readings from Last 3 Encounters:  11/02/20 164 lb (74.4 kg)  08/31/20 160 lb 2 oz (72.6 kg)  07/30/20 156 lb 12.8 oz (71.1 kg)  Kidney Function Lab Results  Component Value Date/Time   CREATININE 1.07 07/30/2020 11:58 AM   CREATININE 0.96 07/27/2020 04:35 AM   CREATININE 1.00 (H) 02/11/2020 09:05 AM   CREATININE 0.92 07/21/2017 04:02 PM   GFR 49.89 (L) 07/30/2020 11:58 AM   GFRNONAA >60 07/27/2020 04:35 AM   GFRNONAA 54 (L) 02/11/2020 09:05 AM   GFRAA 63 02/11/2020 09:05 AM    BMP Latest  Ref Rng & Units 07/30/2020 07/27/2020 07/26/2020  Glucose 70 - 99 mg/dL 107(H) 104(H) 118(H)  BUN 6 - 23 mg/dL 26(H) 21 23  Creatinine 0.40 - 1.20 mg/dL 1.07 0.96 1.08(H)  BUN/Creat Ratio 6 - 22 (calc) - - -  Sodium 135 - 145 mEq/L 137 141 142  Potassium 3.5 - 5.1 mEq/L 4.6 4.2 4.2  Chloride 96 - 112 mEq/L 105 111 112(H)  CO2 19 - 32 mEq/L 24 21(L) 23  Calcium 8.4 - 10.5 mg/dL 10.5 9.2 9.4    Current antihypertensive regimen:  Olmesartan 20 mg 1 tablet daily How often are you checking your Blood Pressure? Patient reports she has it checked during therapy twice a week and she is checking it occasionally. Current home BP readings: She reports she is always around 120/60 What recent interventions/DTPs have been made by any provider to improve Blood Pressure control since last CPP Visit: Patient reports none Any recent hospitalizations or ED visits since last visit with CPP? No What diet changes have been made to improve Blood Pressure Control?  Patient reports her appetite is good. For breakfast she will have cereal with fruit or Peanut Butter and toast, For lunch she will have leftovers from the prior nights dinner. For dinner she is following the mediterranean diet. She reports she is drinking plenty of water and also drinks fruit juice and coffee with breakfast What exercise is being done to improve your Blood Pressure Control?  Patient reports she is doing Physical Therapy once a week, as well as doing Occupational Therapy once a week. Notes: Patient confirms she is taking her Olmesartan 20 mg once daily, Call to pharmacy to verify her fill date spoke to April who states she picked up on 09-03-2020 a 90 DS. Patient reports she is having no major concerns or issues other than her incontinence. Patient is aware of her upcoming appointment with the Pharmacist in November.   Adherence Review: Is the patient currently on ACE/ARB medication? Yes Does the patient have >5 day gap between last estimated  fill dates? No   Care Gaps: COVID Booster #4 Therapist, music) - Overdue Flu Vaccine - Overdue CCM F/U Call - Scheduled for 02-11-21 at 2 pm AWV - Scheduled 02-10-2021   Star Rating Drugs: Olmesartan (Benicar) 20 mg - Last filled 09-03-20 90 DS at [place] Pharmacist Assistant [phone]

## 2020-11-09 DIAGNOSIS — Z9181 History of falling: Secondary | ICD-10-CM | POA: Diagnosis not present

## 2020-11-09 DIAGNOSIS — E785 Hyperlipidemia, unspecified: Secondary | ICD-10-CM | POA: Diagnosis not present

## 2020-11-09 DIAGNOSIS — R778 Other specified abnormalities of plasma proteins: Secondary | ICD-10-CM | POA: Diagnosis not present

## 2020-11-09 DIAGNOSIS — Z79899 Other long term (current) drug therapy: Secondary | ICD-10-CM | POA: Diagnosis not present

## 2020-11-09 DIAGNOSIS — F32A Depression, unspecified: Secondary | ICD-10-CM | POA: Diagnosis not present

## 2020-11-09 DIAGNOSIS — Z8673 Personal history of transient ischemic attack (TIA), and cerebral infarction without residual deficits: Secondary | ICD-10-CM | POA: Diagnosis not present

## 2020-11-09 DIAGNOSIS — Z7901 Long term (current) use of anticoagulants: Secondary | ICD-10-CM | POA: Diagnosis not present

## 2020-11-09 DIAGNOSIS — I129 Hypertensive chronic kidney disease with stage 1 through stage 4 chronic kidney disease, or unspecified chronic kidney disease: Secondary | ICD-10-CM | POA: Diagnosis not present

## 2020-11-09 DIAGNOSIS — E1122 Type 2 diabetes mellitus with diabetic chronic kidney disease: Secondary | ICD-10-CM | POA: Diagnosis not present

## 2020-11-09 DIAGNOSIS — K219 Gastro-esophageal reflux disease without esophagitis: Secondary | ICD-10-CM | POA: Diagnosis not present

## 2020-11-09 DIAGNOSIS — I2692 Saddle embolus of pulmonary artery without acute cor pulmonale: Secondary | ICD-10-CM | POA: Diagnosis not present

## 2020-11-09 DIAGNOSIS — N1831 Chronic kidney disease, stage 3a: Secondary | ICD-10-CM | POA: Diagnosis not present

## 2020-11-12 DIAGNOSIS — I2692 Saddle embolus of pulmonary artery without acute cor pulmonale: Secondary | ICD-10-CM | POA: Diagnosis not present

## 2020-11-12 DIAGNOSIS — R778 Other specified abnormalities of plasma proteins: Secondary | ICD-10-CM | POA: Diagnosis not present

## 2020-11-12 DIAGNOSIS — Z79899 Other long term (current) drug therapy: Secondary | ICD-10-CM | POA: Diagnosis not present

## 2020-11-12 DIAGNOSIS — Z7901 Long term (current) use of anticoagulants: Secondary | ICD-10-CM | POA: Diagnosis not present

## 2020-11-12 DIAGNOSIS — E1122 Type 2 diabetes mellitus with diabetic chronic kidney disease: Secondary | ICD-10-CM | POA: Diagnosis not present

## 2020-11-12 DIAGNOSIS — I129 Hypertensive chronic kidney disease with stage 1 through stage 4 chronic kidney disease, or unspecified chronic kidney disease: Secondary | ICD-10-CM | POA: Diagnosis not present

## 2020-11-12 DIAGNOSIS — Z9181 History of falling: Secondary | ICD-10-CM | POA: Diagnosis not present

## 2020-11-12 DIAGNOSIS — K219 Gastro-esophageal reflux disease without esophagitis: Secondary | ICD-10-CM | POA: Diagnosis not present

## 2020-11-12 DIAGNOSIS — E785 Hyperlipidemia, unspecified: Secondary | ICD-10-CM | POA: Diagnosis not present

## 2020-11-12 DIAGNOSIS — F32A Depression, unspecified: Secondary | ICD-10-CM | POA: Diagnosis not present

## 2020-11-12 DIAGNOSIS — N1831 Chronic kidney disease, stage 3a: Secondary | ICD-10-CM | POA: Diagnosis not present

## 2020-11-12 DIAGNOSIS — Z8673 Personal history of transient ischemic attack (TIA), and cerebral infarction without residual deficits: Secondary | ICD-10-CM | POA: Diagnosis not present

## 2020-11-18 DIAGNOSIS — E785 Hyperlipidemia, unspecified: Secondary | ICD-10-CM | POA: Diagnosis not present

## 2020-11-18 DIAGNOSIS — N1831 Chronic kidney disease, stage 3a: Secondary | ICD-10-CM | POA: Diagnosis not present

## 2020-11-18 DIAGNOSIS — I2692 Saddle embolus of pulmonary artery without acute cor pulmonale: Secondary | ICD-10-CM | POA: Diagnosis not present

## 2020-11-18 DIAGNOSIS — Z79899 Other long term (current) drug therapy: Secondary | ICD-10-CM | POA: Diagnosis not present

## 2020-11-18 DIAGNOSIS — Z8673 Personal history of transient ischemic attack (TIA), and cerebral infarction without residual deficits: Secondary | ICD-10-CM | POA: Diagnosis not present

## 2020-11-18 DIAGNOSIS — F32A Depression, unspecified: Secondary | ICD-10-CM | POA: Diagnosis not present

## 2020-11-18 DIAGNOSIS — I129 Hypertensive chronic kidney disease with stage 1 through stage 4 chronic kidney disease, or unspecified chronic kidney disease: Secondary | ICD-10-CM | POA: Diagnosis not present

## 2020-11-18 DIAGNOSIS — Z9181 History of falling: Secondary | ICD-10-CM | POA: Diagnosis not present

## 2020-11-18 DIAGNOSIS — K219 Gastro-esophageal reflux disease without esophagitis: Secondary | ICD-10-CM | POA: Diagnosis not present

## 2020-11-18 DIAGNOSIS — E1122 Type 2 diabetes mellitus with diabetic chronic kidney disease: Secondary | ICD-10-CM | POA: Diagnosis not present

## 2020-11-18 DIAGNOSIS — Z7901 Long term (current) use of anticoagulants: Secondary | ICD-10-CM | POA: Diagnosis not present

## 2020-11-18 DIAGNOSIS — R778 Other specified abnormalities of plasma proteins: Secondary | ICD-10-CM | POA: Diagnosis not present

## 2020-11-20 ENCOUNTER — Other Ambulatory Visit: Payer: Self-pay | Admitting: Family Medicine

## 2020-11-20 DIAGNOSIS — I2692 Saddle embolus of pulmonary artery without acute cor pulmonale: Secondary | ICD-10-CM | POA: Diagnosis not present

## 2020-11-20 DIAGNOSIS — Z9181 History of falling: Secondary | ICD-10-CM | POA: Diagnosis not present

## 2020-11-20 DIAGNOSIS — K219 Gastro-esophageal reflux disease without esophagitis: Secondary | ICD-10-CM | POA: Diagnosis not present

## 2020-11-20 DIAGNOSIS — I129 Hypertensive chronic kidney disease with stage 1 through stage 4 chronic kidney disease, or unspecified chronic kidney disease: Secondary | ICD-10-CM | POA: Diagnosis not present

## 2020-11-20 DIAGNOSIS — F32A Depression, unspecified: Secondary | ICD-10-CM | POA: Diagnosis not present

## 2020-11-20 DIAGNOSIS — Z8673 Personal history of transient ischemic attack (TIA), and cerebral infarction without residual deficits: Secondary | ICD-10-CM | POA: Diagnosis not present

## 2020-11-20 DIAGNOSIS — R778 Other specified abnormalities of plasma proteins: Secondary | ICD-10-CM | POA: Diagnosis not present

## 2020-11-20 DIAGNOSIS — E1122 Type 2 diabetes mellitus with diabetic chronic kidney disease: Secondary | ICD-10-CM | POA: Diagnosis not present

## 2020-11-20 DIAGNOSIS — F331 Major depressive disorder, recurrent, moderate: Secondary | ICD-10-CM

## 2020-11-20 DIAGNOSIS — N1831 Chronic kidney disease, stage 3a: Secondary | ICD-10-CM | POA: Diagnosis not present

## 2020-11-20 DIAGNOSIS — G629 Polyneuropathy, unspecified: Secondary | ICD-10-CM

## 2020-11-20 DIAGNOSIS — E785 Hyperlipidemia, unspecified: Secondary | ICD-10-CM | POA: Diagnosis not present

## 2020-11-20 DIAGNOSIS — Z79899 Other long term (current) drug therapy: Secondary | ICD-10-CM | POA: Diagnosis not present

## 2020-11-20 DIAGNOSIS — Z7901 Long term (current) use of anticoagulants: Secondary | ICD-10-CM | POA: Diagnosis not present

## 2020-11-23 DIAGNOSIS — F32A Depression, unspecified: Secondary | ICD-10-CM | POA: Diagnosis not present

## 2020-11-23 DIAGNOSIS — Z8673 Personal history of transient ischemic attack (TIA), and cerebral infarction without residual deficits: Secondary | ICD-10-CM | POA: Diagnosis not present

## 2020-11-23 DIAGNOSIS — E785 Hyperlipidemia, unspecified: Secondary | ICD-10-CM | POA: Diagnosis not present

## 2020-11-23 DIAGNOSIS — R778 Other specified abnormalities of plasma proteins: Secondary | ICD-10-CM | POA: Diagnosis not present

## 2020-11-23 DIAGNOSIS — K219 Gastro-esophageal reflux disease without esophagitis: Secondary | ICD-10-CM | POA: Diagnosis not present

## 2020-11-23 DIAGNOSIS — E1122 Type 2 diabetes mellitus with diabetic chronic kidney disease: Secondary | ICD-10-CM | POA: Diagnosis not present

## 2020-11-23 DIAGNOSIS — Z79899 Other long term (current) drug therapy: Secondary | ICD-10-CM | POA: Diagnosis not present

## 2020-11-23 DIAGNOSIS — N1831 Chronic kidney disease, stage 3a: Secondary | ICD-10-CM | POA: Diagnosis not present

## 2020-11-23 DIAGNOSIS — I129 Hypertensive chronic kidney disease with stage 1 through stage 4 chronic kidney disease, or unspecified chronic kidney disease: Secondary | ICD-10-CM | POA: Diagnosis not present

## 2020-11-23 DIAGNOSIS — Z9181 History of falling: Secondary | ICD-10-CM | POA: Diagnosis not present

## 2020-11-23 DIAGNOSIS — Z7901 Long term (current) use of anticoagulants: Secondary | ICD-10-CM | POA: Diagnosis not present

## 2020-11-23 DIAGNOSIS — I2692 Saddle embolus of pulmonary artery without acute cor pulmonale: Secondary | ICD-10-CM | POA: Diagnosis not present

## 2020-12-09 ENCOUNTER — Telehealth: Payer: Self-pay | Admitting: Pharmacist

## 2020-12-09 NOTE — Chronic Care Management (AMB) (Signed)
Chronic Care Management Pharmacy Assistant   Name: Alexa Brown  MRN: [id number] DOB: 03-Dec-1940  Reason for Encounter: Disease State / Hypertension Assessment Call    Conditions to be addressed/monitored: HTN   Recent office visits:  None  Recent consult visits:  none  Hospital visits:  Medication Reconciliation was completed by comparing discharge summary, patient's EMR and Pharmacy list, and upon discussion with patient.   Admitted to the hospital on 04.29.2022 due to Acute Pulmonary embolism . Discharge date was 05.02.2022. Discharged from [place]?Medications Started at [facility] Discharge:?? -started the following medication due to acute pulmonary embolism Apixaban Starter Pack ('10mg'$  and '5mg'$ ) (ELIQUIS STARTER PACK)   Medication Changes at Hospital Discharge: -None change   Medications Discontinued at Hospital Discharge: -Stopped following medication due to acute pulmonary embolism b complex vitamins capsule Clopidogrel 75 MG tablet (PLAVIX) L-Lysine 500 MG Caps Pravastatin 20 MG tablet (PRAVACHOL) Vitamin E 180 MG (400 UNITS) capsule   Medications that remain the same after Hospital Discharge:??  -All other medications will remain the same  Medications: Outpatient Encounter Medications as of 12/09/2020  Medication Sig   ACCU-CHEK AVIVA PLUS test strip USE TO TEST BLOOD SUGAR ONCE DAILY   Accu-Chek Softclix Lancets lancets Use to check blood sugar once daily   acetaminophen (TYLENOL) 325 MG tablet Take 650 mg by mouth every 6 (six) hours as needed for mild pain, fever or headache.   allopurinol (ZYLOPRIM) 100 MG tablet TAKE ONE TABLET BY MOUTH TWICE A DAY   Alpha-Lipoic Acid 600 MG TABS Take 600 mg by mouth daily.   apixaban (ELIQUIS) 5 MG TABS tablet Take 1 tablet (5 mg total) by mouth 2 (two) times daily. Start after completing the starter pack   buPROPion ER (WELLBUTRIN SR) 100 MG 12 hr tablet TAKE ONE TABLET BY MOUTH  TWICE A DAY   colchicine 0.6 MG tablet TAKE ONE TABLET BY MOUTH TWICE A DAY (Patient taking differently: Take 0.6 mg by mouth 2 (two) times daily.)   DULoxetine (CYMBALTA) 60 MG capsule TAKE ONE CAPSULE BY MOUTH DAILY   ezetimibe (ZETIA) 10 MG tablet Take 1 tablet (10 mg total) by mouth daily.   Homeopathic Products [facility] RELIEF EX) Apply 1 application topically daily as needed (foot cramps).   Lancet Devices (ACCU-CHEK SOFTCLIX) lancets Use to test blood sugar once a day.   Multiple Vitamin (MULTIVITAMIN) capsule Take 1 capsule by mouth daily.    olmesartan (BENICAR) 20 MG tablet Take 1 tablet (20 mg total) by mouth daily.   pantoprazole (PROTONIX) 20 MG tablet TAKE ONE TABLET BY MOUTH DAILY   No facility-administered encounter medications on file as of 12/09/2020.  Reviewed chart prior to disease state call. Spoke with patient regarding BP  Recent Office Vitals: BP Readings from Last 3 Encounters:  11/02/20 136/70  08/31/20 128/80  07/30/20 106/62   Pulse Readings from Last 3 Encounters:  11/02/20 87  08/31/20 91  07/30/20 81    Wt Readings from Last 3 Encounters:  11/02/20 164 lb (74.4 kg)  08/31/20 160 lb 2 oz (72.6 kg)  07/30/20 156 lb 12.8 oz (71.1 kg)     Kidney Function Lab Results  Component Value Date/Time   CREATININE 1.07 07/30/2020 11:58 AM   CREATININE 0.96 07/27/2020 04:35 AM   CREATININE 1.00 (H) 02/11/2020 09:05 AM   CREATININE 0.92 07/21/2017 04:02 PM   GFR 49.89 (L) 07/30/2020 11:58 AM   GFRNONAA >60 07/27/2020 04:35 AM  GFRNONAA 54 (L) 02/11/2020 09:05 AM   GFRAA 63 02/11/2020 09:05 AM    BMP Latest Ref Rng & Units 07/30/2020 07/27/2020 07/26/2020  Glucose 70 - 99 mg/dL 107(H) 104(H) 118(H)  BUN 6 - 23 mg/dL 26(H) 21 23  Creatinine 0.40 - 1.20 mg/dL 1.07 0.96 1.08(H)  BUN/Creat Ratio 6 - 22 (calc) - - -  Sodium 135 - 145 mEq/L 137 141 142  Potassium 3.5 - 5.1 mEq/L 4.6 4.2 4.2  Chloride 96 - 112 mEq/L 105 111 112(H)  CO2 19 - 32 mEq/L 24 21(L) 23   Calcium 8.4 - 10.5 mg/dL 10.5 9.2 9.4    Current antihypertensive regimen:  Olmesartan 20 mg 1 tablet daily How often are you checking your Blood Pressure? Patient reports she is checking her blood pressures daily Current home BP readings: Patient reports most recent reading of 120/60 in office on 11-02-20 was 136/70 What recent interventions/DTPs have been made by any provider to improve Blood Pressure control since last CPP Visit: Patient reports no changes. Any recent hospitalizations or ED visits since last visit with CPP? None What diet changes have been made to improve Blood Pressure Control?  Patient reports her appetite has been too good has been eating just fine in watching the amount of sodium in her meals.  What exercise is being done to improve your Blood Pressure Control?  Patient reports she has been feeling good has been doing her exercises and thinks they help her feel good so she will continue to do them.   Adherence Review: Is the patient currently on ACE/ARB medication? Yes Does the patient have >5 day gap between last estimated fill dates? No  Notes: Patient reports she mentioned at her last visit she mentioned she was no longer using the flonase as she had not had any allergy symptoms. Recently has been bothered with her allergies and would like to have a prescription sent in for her so she may start using again. Will forward to the Pharmacist for further direction.    Care Gaps: COVID Booster #4 Therapist, music) - Overdue Flu Vaccine - Overdue CCM F/U Call - Scheduled for 02-11-21 at 2 pm AWV - Scheduled 02-10-2021    Star Rating Drugs: Olmesartan (Benicar) 20 mg - Last filled 09-03-20 90 DS at [facility] History Gaps : Call to [place] Verified  Olmesartan (Benicar) 20 mg - Last filled 12-01-20 90 DS   [place] Clinical Pharmacist Assistant [phone]

## 2020-12-21 ENCOUNTER — Telehealth: Payer: Self-pay | Admitting: Pharmacist

## 2020-12-21 NOTE — Chronic Care Management (AMB) (Signed)
    Chronic Care Management Pharmacy Assistant   Name: DYMIN DINGLEDINE  MRN: [id number] DOB: 01-19-1941  Reason for Encounter: Reschedule appointment Appointment rescheduled with patient for 04/05/2021 at 1:45  Care Gaps:  COVID Booster #4 [employer]) - Overdue Flu Vaccine - Overdue CCM F/U Call - Scheduled for 02-11-21 at 2 pm AWV - Scheduled 02-10-2021   Star Rating Drugs:  Olmesartan (Benicar) 20 mg - Last filled 09-03-20 90 DS at [place] [phone]

## 2020-12-29 ENCOUNTER — Telehealth: Payer: Self-pay | Admitting: Pharmacist

## 2020-12-29 NOTE — Chronic Care Management (AMB) (Signed)
Chronic Care Management Pharmacy Assistant   Name: Alexa Brown  MRN: [id number] DOB: Jan 15, 1941  Reason for Encounter: Disease State / Hypertension Assessment Call   Conditions to be addressed/monitored: HTN  Recent office visits:  None  Recent consult visits:  None  Hospital visits:  None  Medications: Outpatient Encounter Medications as of 12/29/2020  Medication Sig   ACCU-CHEK AVIVA PLUS test strip USE TO TEST BLOOD SUGAR ONCE DAILY   Accu-Chek Softclix Lancets lancets Use to check blood sugar once daily   acetaminophen (TYLENOL) 325 MG tablet Take 650 mg by mouth every 6 (six) hours as needed for mild pain, fever or headache.   allopurinol (ZYLOPRIM) 100 MG tablet TAKE ONE TABLET BY MOUTH TWICE A DAY   Alpha-Lipoic Acid 600 MG TABS Take 600 mg by mouth daily.   apixaban (ELIQUIS) 5 MG TABS tablet Take 1 tablet (5 mg total) by mouth 2 (two) times daily. Start after completing the starter pack   buPROPion ER (WELLBUTRIN SR) 100 MG 12 hr tablet TAKE ONE TABLET BY MOUTH TWICE A DAY   colchicine 0.6 MG tablet TAKE ONE TABLET BY MOUTH TWICE A DAY (Patient taking differently: Take 0.6 mg by mouth 2 (two) times daily.)   DULoxetine (CYMBALTA) 60 MG capsule TAKE ONE CAPSULE BY MOUTH DAILY   ezetimibe (ZETIA) 10 MG tablet Take 1 tablet (10 mg total) by mouth daily.   Homeopathic Products [facility] RELIEF EX) Apply 1 application topically daily as needed (foot cramps).   Lancet Devices (ACCU-CHEK SOFTCLIX) lancets Use to test blood sugar once a day.   Multiple Vitamin (MULTIVITAMIN) capsule Take 1 capsule by mouth daily.    olmesartan (BENICAR) 20 MG tablet Take 1 tablet (20 mg total) by mouth daily.   pantoprazole (PROTONIX) 20 MG tablet TAKE ONE TABLET BY MOUTH DAILY   No facility-administered encounter medications on file as of 12/29/2020.   Fill History: ALLOPURINOL  100 MG TABS 08/09/2020 90   ELIQUIS 5 MG TABLET 11/23/2020 90   DULOXETINE HCL DR 60 MG CAP  11/20/2020 90   DESONIDE 0.05% CREAM 11/06/2020 8   EZETIMIBE 10 MG TABLET 11/28/2020 90   olmesartan (BENICAR) tablet 12/01/2020 90   PANTOPRAZOLE SODIUM  20 MG TBEC 08/05/2020 90   BUPROPION HCL SR 100 MG TABLET 11/25/2020 90  Reviewed chart prior to disease state call. Spoke with patient regarding BP  Recent Office Vitals: BP Readings from Last 3 Encounters:  11/02/20 136/70  08/31/20 128/80  07/30/20 106/62   Pulse Readings from Last 3 Encounters:  11/02/20 87  08/31/20 91  07/30/20 81    Wt Readings from Last 3 Encounters:  11/02/20 164 lb (74.4 kg)  08/31/20 160 lb 2 oz (72.6 kg)  07/30/20 156 lb 12.8 oz (71.1 kg)     Kidney Function Lab Results  Component Value Date/Time   CREATININE 1.07 07/30/2020 11:58 AM   CREATININE 0.96 07/27/2020 04:35 AM   CREATININE 1.00 (H) 02/11/2020 09:05 AM   CREATININE 0.92 07/21/2017 04:02 PM   GFR 49.89 (L) 07/30/2020 11:58 AM   GFRNONAA >60 07/27/2020 04:35 AM   GFRNONAA 54 (L) 02/11/2020 09:05 AM   GFRAA 63 02/11/2020 09:05 AM    BMP Latest Ref Rng & Units 07/30/2020 07/27/2020 07/26/2020  Glucose 70 - 99 mg/dL 107(H) 104(H) 118(H)  BUN 6 - 23 mg/dL 26(H) 21 23  Creatinine 0.40 - 1.20 mg/dL 1.07 0.96 1.08(H)  BUN/Creat Ratio 6 - 22 (calc) - - -  Sodium  135 - 145 mEq/L 137 141 142  Potassium 3.5 - 5.1 mEq/L 4.6 4.2 4.2  Chloride 96 - 112 mEq/L 105 111 112(H)  CO2 19 - 32 mEq/L 24 21(L) 23  Calcium 8.4 - 10.5 mg/dL 10.5 9.2 9.4    Current antihypertensive regimen:  Olmesartan 20 mg take 1 tablet daily  How often are you checking your Blood Pressure? daily  Current home BP readings: Patient states her blood pressure readings run between 120's/70's.   What recent interventions/DTPs have been made by any provider to improve Blood Pressure control since last CPP Visit: None  Any recent hospitalizations or ED visits since last visit with CPP? No  What diet changes have been made to improve Blood Pressure Control?  Patient  states her diet has remained the same, still following a    Mediterranean diet for dinner.  What exercise is being done to improve your Blood Pressure Control? Patient states she doesn't follow any type of exercise routine however, she is active all day doing things around her home.  Adherence Review: Is the patient currently on ACE/ARB medication? Yes Does the patient have >5 day gap between last estimated fill dates? No  Care Gaps: COVID Booster #4 - Overdue Flu Vaccine - Overdue AWV - Scheduled 02-10-2021  Last BP reading was 136/70 on 11/02/2020  Star Rating Drugs: Olmesartan (Benicar) 20 mg - Last filled 12-01-20 90 DS   [place] [phone]

## 2021-01-18 NOTE — Progress Notes (Signed)
Ms. JERNEE MURTAUGH is a 80 y.o.female with hx of PE on chronic anticoagulation,HRpEF,DM II with peripheral neuropathy,HTN,CVA, and unstable gait here today with her daughter to follow on some chronic medical conditions.  Last follow up visit: 11/02/20.  Hypertension:  Medications:Olmesartan 20 mg daily. BP readings at home:< 140/90. Side effects:None. Negative for unusual or severe headache, visual changes, exertional chest pain, dyspnea,palpitation, focal weakness, or edema. CKD III, e GFR has been in the 50's and Cr 1.07. Negative for foam in urine,decreased urine output,or gross hematuria.  Lab Results  Component Value Date   CREATININE 1.07 07/30/2020   BUN 26 (H) 07/30/2020   NA 137 07/30/2020   K 4.6 07/30/2020   CL 105 07/30/2020   CO2 24 07/30/2020   Lab Results  Component Value Date   MICROALBUR 1.1 07/14/2020   MICROALBUR 0.8 01/01/2019   Aortic atherosclerosis seen on imaging. She has not tolerated statins, worsened myalgias,gait,and arthralgias. Lab Results  Component Value Date   CHOL 263 (H) 02/11/2020   HDL 77 02/11/2020   LDLCALC 164 (H) 02/11/2020   TRIG 106 02/11/2020   CHOLHDL 3.4 02/11/2020   Daughter feels like she is breathing "heavier with some activities. Ms. Bevely Palmer has not noted SOB. O2 sat was 90% yesterday.  PE in 06/2020, she is on Eliquis 5 mg bid. Tolerating medication well.  Depression: She is on Cymbalta 60 mg daily and Wellbutrin SR 100 mg bid. She is reporting problem as better controlled and stable. Her sister died last week. She is dealing well with her lost. Negative for suicidal thoughts.  Urinary incontinence nice with coughing sometimes, + urgency. Negative for dysuria and increased frequency. Problem has been stable for a few years.  Review of Systems  Constitutional:  Negative for activity change, appetite change, chills and fever.  HENT:  Negative for mouth sores and sore throat.   Respiratory:  Negative for  cough and wheezing.   Gastrointestinal:  Negative for abdominal pain, nausea and vomiting.  Musculoskeletal:  Positive for gait problem. Negative for myalgias.  Skin:  Negative for rash.  Neurological:  Negative for syncope and facial asymmetry.  Psychiatric/Behavioral:  Negative for confusion and hallucinations.   Rest see pertinent positives and negatives per HPI.  Current Outpatient Medications on File Prior to Visit  Medication Sig Dispense Refill   ACCU-CHEK AVIVA PLUS test strip USE TO TEST BLOOD SUGAR ONCE DAILY 100 strip 1   Accu-Chek Softclix Lancets lancets Use to check blood sugar once daily 100 each 2   acetaminophen (TYLENOL) 325 MG tablet Take 650 mg by mouth every 6 (six) hours as needed for mild pain, fever or headache.     allopurinol (ZYLOPRIM) 100 MG tablet TAKE ONE TABLET BY MOUTH TWICE A DAY 180 tablet 1   Alpha-Lipoic Acid 600 MG TABS Take 600 mg by mouth daily.     apixaban (ELIQUIS) 5 MG TABS tablet Take 1 tablet (5 mg total) by mouth 2 (two) times daily. Start after completing the starter pack 180 tablet 1   buPROPion ER (WELLBUTRIN SR) 100 MG 12 hr tablet TAKE ONE TABLET BY MOUTH TWICE A DAY 180 tablet 1   colchicine 0.6 MG tablet TAKE ONE TABLET BY MOUTH TWICE A DAY (Patient taking differently: Take 0.6 mg by mouth 2 (two) times daily.) 30 tablet 0   DULoxetine (CYMBALTA) 60 MG capsule TAKE ONE CAPSULE BY MOUTH DAILY 90 capsule 1   ezetimibe (ZETIA) 10 MG tablet Take 1 tablet (10 mg  total) by mouth daily. 90 tablet 2   Homeopathic Products (THERAWORX RELIEF EX) Apply 1 application topically daily as needed (foot cramps).     Lancet Devices (ACCU-CHEK SOFTCLIX) lancets Use to test blood sugar once a day. 100 each 2   Multiple Vitamin (MULTIVITAMIN) capsule Take 1 capsule by mouth daily.      olmesartan (BENICAR) 20 MG tablet Take 1 tablet (20 mg total) by mouth daily. 90 tablet 2   pantoprazole (PROTONIX) 20 MG tablet TAKE ONE TABLET BY MOUTH DAILY 90 tablet 2   No  current facility-administered medications on file prior to visit.   Past Medical History:  Diagnosis Date   Chicken pox    Depression    Diabetes mellitus without complication (HCC)    GERD (gastroesophageal reflux disease)    Hyperlipidemia    Hypertension    Stroke [facility])    UTI (urinary tract infection)    No Known Allergies  Social History   Socioeconomic History   Marital status: Widowed    Spouse name: Not on file   Number of children: Not on file   Years of education: Not on file   Highest education level: Not on file  Occupational History   Occupation: N/A  Tobacco Use   Smoking status: Never   Smokeless tobacco: Never  Vaping Use   Vaping Use: Never used  Substance and Sexual Activity   Alcohol use: Yes    Comment: occassional wine   Drug use: No   Sexual activity: Not Currently  Other Topics Concern   Not on file  Social History Narrative   Pt lives in single story home with her daughter   Has 2 children   Some college education   Retired Engineer, production for [employer]   Social Determinants of Health   Financial Resource Strain: Low Risk    Difficulty of Paying Living Expenses: Not hard at all  Food Insecurity: No Food Insecurity   Worried About Charity fundraiser in the Last Year: Never true   Arboriculturist in the Last Year: Never true  Transportation Needs: No Transportation Needs   Lack of Transportation (Medical): No   Lack of Transportation (Non-Medical): No  Physical Activity: Insufficiently Active   Days of Exercise per Week: 2 days   Minutes of Exercise per Session: 30 min  Stress: No Stress Concern Present   Feeling of Stress : Not at all  Social Connections: Moderately Integrated   Frequency of Communication with Friends and Family: More than three times a week   Frequency of Social Gatherings with Friends and Family: Once a week   Attends Religious Services: More than 4 times per year   Active Member of [employer] or  Organizations: Yes   Attends Archivist Meetings: 1 to 4 times per year   Marital Status: Widowed   Vitals:   01/19/21 1446  BP: 126/70  Pulse: 96  Resp: 16  SpO2: 95%   Body mass index is 25.26 kg/m.  Physical Exam Vitals and nursing note reviewed.  Constitutional:      General: She is not in acute distress.    Appearance: She is well-developed.  HENT:     Head: Normocephalic and atraumatic.     Mouth/Throat:     Mouth: Mucous membranes are moist.     Pharynx: Oropharynx is clear.  Eyes:     Conjunctiva/sclera: Conjunctivae normal.  Cardiovascular:     Rate and Rhythm: Normal rate and  regular rhythm.     Heart sounds: No murmur heard.    Comments: DP pulses present. Pulmonary:     Effort: Pulmonary effort is normal. No respiratory distress.     Breath sounds: Normal breath sounds.  Abdominal:     Palpations: Abdomen is soft. There is no hepatomegaly or mass.     Tenderness: There is no abdominal tenderness.  Lymphadenopathy:     Cervical: No cervical adenopathy.  Skin:    General: Skin is warm.     Findings: No erythema or rash.  Neurological:     General: No focal deficit present.     Mental Status: She is alert and oriented to person, place, and time.     Cranial Nerves: No cranial nerve deficit.     Comments: Unstable gait assisted by a cane.  Psychiatric:     Comments: Well groomed, good eye contact.   ASSESSMENT AND PLAN:   Ms.Casey was seen today for follow-up.  Diagnoses and all orders for this visit: Orders Placed This Encounter  Procedures   Basic metabolic panel   Lab Results  Component Value Date   CREATININE 1.01 01/19/2021   BUN 17 01/19/2021   NA 137 01/19/2021   K 4.5 01/19/2021   CL 104 01/19/2021   CO2 25 01/19/2021   Stage 3a chronic kidney disease ([place]) Problem has been stable, e GFT 50's and Cr 1.07. Continue adequate hydration and low salt diet. No changes in Olmesartan dose, continue avoiding NSAID's.  Moderate  episode of recurrent major depressive disorder ([place]) Stable with current management. No changes in Duloxetine or Wellbutrin SR dose.  Atherosclerosis of aorta ([place]) She has not tolerated statins in the past. She is on Zetia 10 mg daily and on chronic anticoagulation.  Incontinent of urine Urgency and stress incontinence. We discussed treatment options and side effects. I recommend no pharmacologic treatment for now due to risk of side effects. Kegel and pelvic floor exercises recommended.   Systolic hypertension with cerebrovascular disease BP adequately controlled. Continue current management: Benicar 20 mg daily. DASH/low salt diet to continue. Continue monitoring BP at home. Eye exam is current.  Return in about 20 weeks (around 06/08/2021).   Sybel G. Martinique, MD  [facility]. [place] office.

## 2021-01-19 ENCOUNTER — Other Ambulatory Visit: Payer: Self-pay

## 2021-01-19 ENCOUNTER — Encounter: Payer: Self-pay | Admitting: Family Medicine

## 2021-01-19 ENCOUNTER — Ambulatory Visit (INDEPENDENT_AMBULATORY_CARE_PROVIDER_SITE_OTHER): Payer: Medicare Other | Admitting: Family Medicine

## 2021-01-19 VITALS — BP 126/70 | HR 96 | Resp 16 | Ht 67.0 in | Wt 161.2 lb

## 2021-01-19 DIAGNOSIS — I700 Atherosclerosis of aorta: Secondary | ICD-10-CM

## 2021-01-19 DIAGNOSIS — N1831 Chronic kidney disease, stage 3a: Secondary | ICD-10-CM | POA: Diagnosis not present

## 2021-01-19 DIAGNOSIS — I674 Hypertensive encephalopathy: Secondary | ICD-10-CM | POA: Diagnosis not present

## 2021-01-19 DIAGNOSIS — F331 Major depressive disorder, recurrent, moderate: Secondary | ICD-10-CM | POA: Diagnosis not present

## 2021-01-19 DIAGNOSIS — R32 Unspecified urinary incontinence: Secondary | ICD-10-CM

## 2021-01-19 NOTE — Assessment & Plan Note (Signed)
BP adequately controlled. Continue current management: Benicar 20 mg daily. DASH/low salt diet to continue. Continue monitoring BP at home. Eye exam is current.

## 2021-01-19 NOTE — Patient Instructions (Addendum)
A few things to remember from today's visit:   Stage 3a chronic kidney disease ([place])  Systolic hypertension with cerebrovascular disease - Plan: Basic metabolic panel  Urinary incontinence, unspecified type  Moderate episode of recurrent major depressive disorder ([place])  If you need refills please call your pharmacy. Do not use My Chart to request refills or for acute issues that need immediate attention.    Please be sure medication list is accurate. If a new problem present, please set up appointment sooner than planned today.   Kegel Exercises Kegel exercises can help strengthen your pelvic floor muscles. The pelvic floor is a group of muscles that support your rectum, small intestine, and bladder. In females, pelvic floor muscles also help support the womb (uterus). These muscles help you control the flow of urine and stool. Kegel exercises are painless and simple, and they do not require any equipment. Your provider may suggest Kegel exercises to: Improve bladder and bowel control. Improve sexual response. Improve weak pelvic floor muscles after surgery to remove the uterus (hysterectomy) or pregnancy (females). Improve weak pelvic floor muscles after prostate gland removal or surgery (males). Kegel exercises involve squeezing your pelvic floor muscles, which are the same muscles you squeeze when you try to stop the flow of urine or keep from passing gas. The exercises can be done while sitting, standing, or lying down, but it is best to vary your position. Exercises How to do Kegel exercises: Squeeze your pelvic floor muscles tight. You should feel a tight lift in your rectal area. If you are a female, you should also feel a tightness in your vaginal area. Keep your stomach, buttocks, and legs relaxed. Hold the muscles tight for up to 10 seconds. Breathe normally. Relax your muscles. Repeat as told by your health care provider. Repeat this exercise daily as told by your health  care provider. Continue to do this exercise for at least 4-6 weeks, or for as long as told by your health care provider. You may be referred to a physical therapist who can help you learn more about how to do Kegel exercises. Depending on your condition, your health care provider may recommend: Varying how long you squeeze your muscles. Doing several sets of exercises every day. Doing exercises for several weeks. Making Kegel exercises a part of your regular exercise routine. This information is not intended to replace advice given to you by your health care provider. Make sure you discuss any questions you have with your health care provider. Document Revised: 03/04/2020 Document Reviewed: 11/01/2017 Elsevier Patient Education  [place].

## 2021-01-19 NOTE — Assessment & Plan Note (Signed)
Urgency and stress incontinence. We discussed treatment options and side effects. I recommend no pharmacologic treatment for now due to risk of side effects. Kegel and pelvic floor exercises recommended.

## 2021-01-20 LAB — BASIC METABOLIC PANEL
BUN: 17 mg/dL (ref 6–23)
CO2: 25 mEq/L (ref 19–32)
Calcium: 10.2 mg/dL (ref 8.4–10.5)
Chloride: 104 mEq/L (ref 96–112)
Creatinine, Ser: 1.01 mg/dL (ref 0.40–1.20)
GFR: 53.29 mL/min — ABNORMAL LOW (ref >60.00)
Glucose, Bld: 104 mg/dL — ABNORMAL HIGH (ref 70–99)
Potassium: 4.5 mEq/L (ref 3.5–5.1)
Sodium: 137 mEq/L (ref 135–145)

## 2021-02-05 ENCOUNTER — Ambulatory Visit (INDEPENDENT_AMBULATORY_CARE_PROVIDER_SITE_OTHER): Payer: Medicare Other | Admitting: Podiatry

## 2021-02-05 ENCOUNTER — Encounter: Payer: Self-pay | Admitting: Podiatry

## 2021-02-05 ENCOUNTER — Other Ambulatory Visit: Payer: Self-pay

## 2021-02-05 DIAGNOSIS — L84 Corns and callosities: Secondary | ICD-10-CM | POA: Diagnosis not present

## 2021-02-05 DIAGNOSIS — M79675 Pain in left toe(s): Secondary | ICD-10-CM

## 2021-02-05 DIAGNOSIS — M79674 Pain in right toe(s): Secondary | ICD-10-CM | POA: Diagnosis not present

## 2021-02-05 DIAGNOSIS — E11610 Type 2 diabetes mellitus with diabetic neuropathic arthropathy: Secondary | ICD-10-CM | POA: Diagnosis not present

## 2021-02-05 DIAGNOSIS — B351 Tinea unguium: Secondary | ICD-10-CM

## 2021-02-08 NOTE — Progress Notes (Signed)
  Subjective:  Patient ID: Alexa Brown, female    DOB: 1940/12/14,  MRN: [id number]  Alexa Brown presents to clinic today for at risk foot care. Patient has history of diabetic neuroarthropathy and callus(es) of right foot and painful thick toenails that are difficult to trim. Painful toenails interfere with ambulation. Aggravating factors include wearing enclosed shoe gear. Pain is relieved with periodic professional debridement. Painful calluses are aggravated when weightbearing with and without shoegear. Pain is relieved with periodic professional debridement.  Patient states blood glucose was 175 mg/dl on yesterday.    PCP is Martinique, Harleen G, MD , and last visit was 11/02/2020.  No Known Allergies  Review of Systems: Negative except as noted in the HPI. Objective:   Constitutional Alexa Brown is a pleasant 80 y.o. Caucasian female, WD, WN in NAD. AAO x 3.   Vascular CFT immediate b/l LE. Palpable DP/PT pulses b/l LE. Digital hair sparse b/l. Skin temperature gradient WNL b/l. No pain with calf compression b/l. No edema noted b/l. No cyanosis or clubbing noted b/l LE. No cyanosis or clubbing noted.  Neurologic Normal speech. Oriented to person, place, and time. Protective sensation intact 5/5 intact bilaterally with 10g monofilament b/l.  Dermatologic Pedal integument with normal turgor, texture and tone b/l LE. No open wounds b/l. No interdigital macerations b/l. Toenails 1-5 b/l elongated, thickened, discolored with subungual debris. +Tenderness with dorsal palpation of nailplates. Hyperkeratotic lesion(s) noted R hallux.  Orthopedic: Normal muscle strength 5/5 to all lower extremity muscle groups bilaterally. No pain, crepitus or joint limitation noted with ROM bilateral LE. No gross bony deformities bilaterally.   Radiographs: None Assessment:   1. Pain due to onychomycosis of toenails of both feet   2. Callus   3. Type 2 diabetes mellitus with diabetic  neuropathic arthropathy, unspecified whether long term insulin use ([place])    Plan:  Patient was evaluated and treated and all questions answered. Consent given for treatment as described below: -No new findings. No new orders. -Continue diabetic foot care principles: inspect feet daily, monitor glucose as recommended by PCP and/or Endocrinologist, and follow prescribed diet per PCP, Endocrinologist and/or dietician. -Toenails 1-5 b/l were debrided in length and girth with sterile nail nippers and dremel without iatrogenic bleeding.  -Callus(es) R hallux pared utilizing sterile scalpel blade without complication or incident. Total number debrided =1. -Patient/POA to call should there be question/concern in the interim.  Return in about 3 months (around 05/08/2021).  Marzetta Board, DPM

## 2021-02-09 ENCOUNTER — Ambulatory Visit (INDEPENDENT_AMBULATORY_CARE_PROVIDER_SITE_OTHER): Payer: Medicare Other

## 2021-02-09 VITALS — Ht 66.0 in | Wt 161.0 lb

## 2021-02-09 DIAGNOSIS — Z0000 Encounter for general adult medical examination without abnormal findings: Secondary | ICD-10-CM | POA: Diagnosis not present

## 2021-02-09 NOTE — Progress Notes (Signed)
I connected with Alexa Brown today by telephone and verified that I am speaking with the correct person using two identifiers. Location patient: home Location provider: work Persons participating in the virtual visit: Alexa Brown, [employer] LPN.   I discussed the limitations, risks, security and privacy concerns of performing an evaluation and management service by telephone and the availability of in person appointments. I also discussed with the patient that there may be a patient responsible charge related to this service. The patient expressed understanding and verbally consented to this telephonic visit.    Interactive audio and video telecommunications were attempted between this provider and patient, however failed, due to patient having technical difficulties OR patient did not have access to video capability.  We continued and completed visit with audio only.     Vital signs may be patient reported or missing.  Subjective:   Alexa Brown is a 80 y.o. female who presents for Medicare Annual (Subsequent) preventive examination.  Review of Systems     Cardiac Risk Factors include: advanced age (>75men, >35 women);diabetes mellitus;dyslipidemia;sedentary lifestyle     Objective:    Today's Vitals   02/09/21 1411  Weight: 161 lb (73 kg)  Height: 5\' 6"  (1.676 m)   Body mass index is 25.99 kg/m.  Advanced Directives 02/09/2021 07/24/2020 02/05/2020 01/30/2018 09/26/2017 09/22/2016 01/25/2016  Does Patient Have a Medical Advance Directive? No No No No No No No  Would patient like information on creating a medical advance directive? - No - Patient declined - No - Patient declined - Yes (MAU/Ambulatory/Procedural Areas - Information given) No - patient declined information    Current Medications (verified) Outpatient Encounter Medications as of 02/09/2021  Medication Sig   ACCU-CHEK AVIVA PLUS test strip USE TO TEST BLOOD SUGAR ONCE DAILY    Accu-Chek Softclix Lancets lancets Use to check blood sugar once daily   acetaminophen (TYLENOL) 325 MG tablet Take 650 mg by mouth every 6 (six) hours as needed for mild pain, fever or headache.   allopurinol (ZYLOPRIM) 100 MG tablet TAKE ONE TABLET BY MOUTH TWICE A DAY   Alpha-Lipoic Acid 600 MG TABS Take 600 mg by mouth daily.   apixaban (ELIQUIS) 5 MG TABS tablet Take 1 tablet (5 mg total) by mouth 2 (two) times daily. Start after completing the starter pack   buPROPion ER (WELLBUTRIN SR) 100 MG 12 hr tablet TAKE ONE TABLET BY MOUTH TWICE A DAY   colchicine 0.6 MG tablet TAKE ONE TABLET BY MOUTH TWICE A DAY (Patient taking differently: Take 0.6 mg by mouth 2 (two) times daily.)   DULoxetine (CYMBALTA) 60 MG capsule TAKE ONE CAPSULE BY MOUTH DAILY   ezetimibe (ZETIA) 10 MG tablet Take 1 tablet (10 mg total) by mouth daily.   Homeopathic Products [facility] RELIEF EX) Apply 1 application topically daily as needed (foot cramps).   ketoconazole (NIZORAL) 2 % cream SMARTSIG:1 Application Topical 1 to 2 Times Daily   Lancet Devices (ACCU-CHEK SOFTCLIX) lancets Use to test blood sugar once a day.   Multiple Vitamin (MULTIVITAMIN) capsule Take 1 capsule by mouth daily.    olmesartan (BENICAR) 20 MG tablet Take 1 tablet (20 mg total) by mouth daily.   pantoprazole (PROTONIX) 20 MG tablet TAKE ONE TABLET BY MOUTH DAILY   No facility-administered encounter medications on file as of 02/09/2021.    Allergies (verified) Patient has no known allergies.   History: Past Medical History:  Diagnosis Date   Chicken pox  Depression    Diabetes mellitus without complication ([place])    GERD (gastroesophageal reflux disease)    Hyperlipidemia    Hypertension    Stroke [facility])    UTI (urinary tract infection)    Past Surgical History:  Procedure Laterality Date   ABDOMINAL HYSTERECTOMY     APPENDECTOMY     BREAST BIOPSY Left 2014   BREAST SURGERY     biopsy   Family History  Problem Relation Age  of Onset   Hyperlipidemia Mother    Heart disease Mother    Stroke Mother    Hypertension Mother    Hyperlipidemia Father    Heart disease Father    Stroke Father    Hypertension Father    Asthma Father    Parkinson's disease Sister    Social History   Socioeconomic History   Marital status: Widowed    Spouse name: Not on file   Number of children: Not on file   Years of education: Not on file   Highest education level: Not on file  Occupational History   Occupation: N/A  Tobacco Use   Smoking status: Never   Smokeless tobacco: Never  Vaping Use   Vaping Use: Never used  Substance and Sexual Activity   Alcohol use: Yes    Comment: occassional wine   Drug use: No   Sexual activity: Not Currently  Other Topics Concern   Not on file  Social History Narrative   Pt lives in single story home with her daughter   Has 2 children   Some college education   Retired Engineer, production for [employer]   Social Determinants of Health   Financial Resource Strain: Low Risk    Difficulty of Paying Living Expenses: Not hard at all  Food Insecurity: No Food Insecurity   Worried About Charity fundraiser in the Last Year: Never true   Arboriculturist in the Last Year: Never true  Transportation Needs: No Transportation Needs   Lack of Transportation (Medical): No   Lack of Transportation (Non-Medical): No  Physical Activity: Inactive   Days of Exercise per Week: 0 days   Minutes of Exercise per Session: 0 min  Stress: No Stress Concern Present   Feeling of Stress : Not at all  Social Connections: Not on file    Tobacco Counseling Counseling given: Not Answered   Clinical Intake:  Pre-visit preparation completed: Yes  Pain : No/denies pain     Nutritional Status: BMI 25 -29 Overweight Nutritional Risks: None Diabetes: Yes  How often do you need to have someone help you when you read instructions, pamphlets, or other written materials from your doctor or  pharmacy?: 1 - Never What is the last grade level you completed in school?: some college  Diabetic? Yes Nutrition Risk Assessment:  Has the patient had any N/V/D within the last 2 months?  No  Does the patient have any non-healing wounds?  No  Has the patient had any unintentional weight loss or weight gain?  No   Diabetes:  Is the patient diabetic?  Yes  If diabetic, was a CBG obtained today?  No  Did the patient bring in their glucometer from home?  No  How often do you monitor your CBG's? daily.   Financial Strains and Diabetes Management:  Are you having any financial strains with the device, your supplies or your medication? No .  Does the patient want to be seen by Chronic Care Management for  management of their diabetes?  No  Would the patient like to be referred to a Nutritionist or for Diabetic Management?  No   Diabetic Exams:  Diabetic Eye Exam: Completed 09/21/2020 Diabetic Foot Exam: Completed 07/30/2020   Interpreter Needed?: No  Information entered by :: NAllen LPN   Activities of Daily Living In your present state of health, do you have any difficulty performing the following activities: 02/09/2021 07/24/2020  Hearing? Y N  Comment has hearing aides -  Vision? N N  Difficulty concentrating or making decisions? Y N  Walking or climbing stairs? N Y  Comment - secondary to chronic pain  Dressing or bathing? N N  Doing errands, shopping? Tempie Donning  Comment daughter takes -  Conservation officer, nature and eating ? N -  Using the Toilet? N -  In the past six months, have you accidently leaked urine? Y -  Comment incontinence -  Do you have problems with loss of bowel control? N -  Managing your Medications? Y -  Comment daughter sets up -  Managing your Finances? N -  Housekeeping or managing your Housekeeping? N -  Some recent data might be hidden    Patient Care Team: [place], Yui G, MD as PCP - General (Family Medicine) Viona Gilmore, [facility] as Pharmacist  (Pharmacist)  Indicate any recent Medical Services you may have received from other than [facility] providers in the past year (date may be approximate).     Assessment:   This is a routine wellness examination for Shanikia.  Hearing/Vision screen Vision Screening - Comments:: Regular eye exams,   Dietary issues and exercise activities discussed: Current Exercise Habits: The patient does not participate in regular exercise at present   Goals Addressed             This Visit's Progress    Patient Stated       02/09/2021, lose a few pounds, work on being less depressed       Depression Screen PHQ 2/9 Scores 02/09/2021 08/31/2020 02/05/2020 08/07/2019 01/30/2019 09/26/2017 09/22/2016  PHQ - 2 Score 3 5 0 5 1 3 6   PHQ- 9 Score 8 17 - 20 - 12 17    Fall Risk Fall Risk  02/09/2021 01/19/2021 02/05/2020 01/30/2019 10/30/2017  Falls in the past year? 1 0 1 1 Yes  Number falls in past yr: 0 0 1 1 2  or more  Injury with Fall? 1 0 1 0 -  Comment dislocated elbow - - - -  Risk for fall due to : Impaired balance/gait;Impaired mobility;Medication side effect Impaired balance/gait;History of fall(s) Impaired balance/gait;Impaired mobility;History of fall(s) Impaired balance/gait;Orthopedic patient -  Follow up Falls evaluation completed;Education provided;Falls prevention discussed Education provided Falls prevention discussed Education provided -    FALL RISK PREVENTION PERTAINING TO THE HOME:  Any stairs in or around the home? No  If so, are there any without handrails?  N/a Home free of loose throw rugs in walkways, pet beds, electrical cords, etc? Yes  Adequate lighting in your home to reduce risk of falls? Yes   ASSISTIVE DEVICES UTILIZED TO PREVENT FALLS:  Life alert? Yes  Use of a cane, walker or w/c? Yes  Grab bars in the bathroom? No  Shower chair or bench in shower? Yes  Elevated toilet seat or a handicapped toilet? Yes   TIMED UP AND GO:  Was the test performed? No .       Cognitive Function: MMSE - Mini Mental State Exam 10/30/2017  09/26/2017  Not completed: - (No Data)  Orientation to time 5 -  Orientation to Place 5 -  Registration 3 -  Attention/ Calculation 5 -  Recall 2 -  Language- name 2 objects 2 -  Language- repeat 1 -  Language- follow 3 step command 3 -  Language- read & follow direction 1 -  Write a sentence 1 -  Copy design 1 -  Total score 29 -     6CIT Screen 02/09/2021 02/05/2020  What Year? 0 points 0 points  What month? 0 points 0 points  What time? 0 points -  Count back from 20 0 points 0 points  Months in reverse 0 points 0 points  Repeat phrase 10 points 0 points  Total Score 10 -    Immunizations Immunization History  Administered Date(s) Administered   Hepatitis B, adult 01/22/2018   Influenza Whole 02/01/2016   Influenza, High Dose Seasonal PF 01/14/2016, 11/17/2018   Influenza-Unspecified 01/12/2017, 12/26/2017, 12/13/2019   PFIZER(Purple Top)SARS-COV-2 Vaccination 05/03/2019, 05/28/2019, 01/11/2020   Tdap 12/24/2015   Zoster Recombinat (Shingrix) 08/16/2016, 01/12/2017    TDAP status: Up to date  Flu Vaccine status: Up to date  Pneumococcal vaccine status: Declined,  Education has been provided regarding the importance of this vaccine but patient still declined. Advised may receive this vaccine at local pharmacy or Health Dept. Aware to provide a copy of the vaccination record if obtained from local pharmacy or Health Dept. Verbalized acceptance and understanding.   Covid-19 vaccine status: Completed vaccines  Qualifies for Shingles Vaccine? Yes   Zostavax completed No   Shingrix Completed?: Yes  Screening Tests Health Maintenance  Topic Date Due   COVID-19 Vaccine (4 - Booster for Pfizer series) 03/07/2020   INFLUENZA VACCINE  10/26/2020   Pneumonia Vaccine 28+ Years old (1 - PCV) 01/19/2022 (Originally 06/18/1946)   HEMOGLOBIN A1C  05/05/2021   FOOT EXAM  07/30/2021   OPHTHALMOLOGY EXAM   09/21/2021   TETANUS/TDAP  12/23/2025   DEXA SCAN  Completed   Hepatitis C Screening  Completed   Zoster Vaccines- Shingrix  Completed   HPV VACCINES  Aged Out    Health Maintenance  Health Maintenance Due  Topic Date Due   COVID-19 Vaccine (4 - Booster for Pfizer series) 03/07/2020   INFLUENZA VACCINE  10/26/2020    Colorectal cancer screening: No longer required.   Mammogram status: Completed 06/01/2020. Repeat every year  Bone Density status: Completed 11/17/2016. Results reflect: Bone density results: NORMAL. Repeat every 0 years.  Lung Cancer Screening: (Low Dose CT Chest recommended if Age 20-80 years, 30 pack-year currently smoking OR have quit w/in 15years.) does not qualify.   Lung Cancer Screening Referral: no  Additional Screening:  Hepatitis C Screening: does qualify; Completed 07/14/2020  Vision Screening: Recommended annual ophthalmology exams for early detection of glaucoma and other disorders of the eye. Is the patient up to date with their annual eye exam?  Yes  Who is the provider or what is the name of the office in which the patient attends annual eye exams? Looking for new doctor If pt is not established with a provider, would they like to be referred to a provider to establish care? No .   Dental Screening: Recommended annual dental exams for proper oral hygiene  Community Resource Referral / Chronic Care Management: CRR required this visit?  No   CCM required this visit?  No      Plan:     I have personally  reviewed and noted the following in the patient's chart:   Medical and social history Use of alcohol, tobacco or illicit drugs  Current medications and supplements including opioid prescriptions.  Functional ability and status Nutritional status Physical activity Advanced directives List of other physicians Hospitalizations, surgeries, and ER visits in previous 12 months Vitals Screenings to include cognitive, depression, and  falls Referrals and appointments  In addition, I have reviewed and discussed with patient certain preventive protocols, quality metrics, and best practice recommendations. A written personalized care plan for preventive services as well as general preventive health recommendations were provided to patient.     Kellie Simmering, LPN   01/22/2535   Nurse Notes: none

## 2021-02-09 NOTE — Patient Instructions (Signed)
Alexa Brown , Thank you for taking time to come for your Medicare Wellness Visit. I appreciate your ongoing commitment to your health goals. Please review the following plan we discussed and let me know if I can assist you in the future.   Screening recommendations/referrals: Colonoscopy: not required Mammogram: completed 06/01/2020 Bone Density: completed 11/17/2016 Recommended yearly ophthalmology/optometry visit for glaucoma screening and checkup Recommended yearly dental visit for hygiene and checkup  Vaccinations: Influenza vaccine: completed per patient Pneumococcal vaccine: decline Tdap vaccine: completed 12/24/2015, due 12/23/2025 Shingles vaccine: completed   Covid-19: 01/11/2020, 05/28/2019, 05/03/2019  Advanced directives: Advance directive discussed with you today. Even though you declined this today please call our office should you change your mind and we can give you the proper paperwork for you to fill out.  Conditions/risks identified: none  Next appointment: Follow up in one year for your annual wellness visit    Preventive Care 65 Years and Older, Female Preventive care refers to lifestyle choices and visits with your health care provider that can promote health and wellness. What does preventive care include? A yearly physical exam. This is also called an annual well check. Dental exams once or twice a year. Routine eye exams. Ask your health care provider how often you should have your eyes checked. Personal lifestyle choices, including: Daily care of your teeth and gums. Regular physical activity. Eating a healthy diet. Avoiding tobacco and drug use. Limiting alcohol use. Practicing safe sex. Taking low-dose aspirin every day. Taking vitamin and mineral supplements as recommended by your health care provider. What happens during an annual well check? The services and screenings done by your health care provider during your annual well check will depend on  your age, overall health, lifestyle risk factors, and family history of disease. Counseling  Your health care provider may ask you questions about your: Alcohol use. Tobacco use. Drug use. Emotional well-being. Home and relationship well-being. Sexual activity. Eating habits. History of falls. Memory and ability to understand (cognition). Work and work Statistician. Reproductive health. Screening  You may have the following tests or measurements: Height, weight, and BMI. Blood pressure. Lipid and cholesterol levels. These may be checked every 5 years, or more frequently if you are over 43 years old. Skin check. Lung cancer screening. You may have this screening every year starting at age 53 if you have a 30-pack-year history of smoking and currently smoke or have quit within the past 15 years. Fecal occult blood test (FOBT) of the stool. You may have this test every year starting at age 44. Flexible sigmoidoscopy or colonoscopy. You may have a sigmoidoscopy every 5 years or a colonoscopy every 10 years starting at age 11. Hepatitis C blood test. Hepatitis B blood test. Sexually transmitted disease (STD) testing. Diabetes screening. This is done by checking your blood sugar (glucose) after you have not eaten for a while (fasting). You may have this done every 1-3 years. Bone density scan. This is done to screen for osteoporosis. You may have this done starting at age 32. Mammogram. This may be done every 1-2 years. Talk to your health care provider about how often you should have regular mammograms. Talk with your health care provider about your test results, treatment options, and if necessary, the need for more tests. Vaccines  Your health care provider may recommend certain vaccines, such as: Influenza vaccine. This is recommended every year. Tetanus, diphtheria, and acellular pertussis (Tdap, Td) vaccine. You may need a Td booster every 10 years.  Zoster vaccine. You may need this  after age 77. Pneumococcal 13-valent conjugate (PCV13) vaccine. One dose is recommended after age 25. Pneumococcal polysaccharide (PPSV23) vaccine. One dose is recommended after age 11. Talk to your health care provider about which screenings and vaccines you need and how often you need them. This information is not intended to replace advice given to you by your health care provider. Make sure you discuss any questions you have with your health care provider. Document Released: 04/10/2015 Document Revised: 12/02/2015 Document Reviewed: 01/13/2015 Elsevier Interactive Patient Education  2017 [place] Prevention in the Home Falls can cause injuries. They can happen to people of all ages. There are many things you can do to make your home safe and to help prevent falls. What can I do on the outside of my home? Regularly fix the edges of walkways and driveways and fix any cracks. Remove anything that might make you trip as you walk through a door, such as a raised step or threshold. Trim any bushes or trees on the path to your home. Use bright outdoor lighting. Clear any walking paths of anything that might make someone trip, such as rocks or tools. Regularly check to see if handrails are loose or broken. Make sure that both sides of any steps have handrails. Any raised decks and porches should have guardrails on the edges. Have any leaves, snow, or ice cleared regularly. Use sand or salt on walking paths during winter. Clean up any spills in your garage right away. This includes oil or grease spills. What can I do in the bathroom? Use night lights. Install grab bars by the toilet and in the tub and shower. Do not use towel bars as grab bars. Use non-skid mats or decals in the tub or shower. If you need to sit down in the shower, use a plastic, non-slip stool. Keep the floor dry. Clean up any water that spills on the floor as soon as it happens. Remove soap buildup in the tub or  shower regularly. Attach bath mats securely with double-sided non-slip rug tape. Do not have throw rugs and other things on the floor that can make you trip. What can I do in the bedroom? Use night lights. Make sure that you have a light by your bed that is easy to reach. Do not use any sheets or blankets that are too big for your bed. They should not hang down onto the floor. Have a firm chair that has side arms. You can use this for support while you get dressed. Do not have throw rugs and other things on the floor that can make you trip. What can I do in the kitchen? Clean up any spills right away. Avoid walking on wet floors. Keep items that you use a lot in easy-to-reach places. If you need to reach something above you, use a strong step stool that has a grab bar. Keep electrical cords out of the way. Do not use floor polish or wax that makes floors slippery. If you must use wax, use non-skid floor wax. Do not have throw rugs and other things on the floor that can make you trip. What can I do with my stairs? Do not leave any items on the stairs. Make sure that there are handrails on both sides of the stairs and use them. Fix handrails that are broken or loose. Make sure that handrails are as long as the stairways. Check any carpeting to make sure that  it is firmly attached to the stairs. Fix any carpet that is loose or worn. Avoid having throw rugs at the top or bottom of the stairs. If you do have throw rugs, attach them to the floor with carpet tape. Make sure that you have a light switch at the top of the stairs and the bottom of the stairs. If you do not have them, ask someone to add them for you. What else can I do to help prevent falls? Wear shoes that: Do not have high heels. Have rubber bottoms. Are comfortable and fit you well. Are closed at the toe. Do not wear sandals. If you use a stepladder: Make sure that it is fully opened. Do not climb a closed stepladder. Make  sure that both sides of the stepladder are locked into place. Ask someone to hold it for you, if possible. Clearly mark and make sure that you can see: Any grab bars or handrails. First and last steps. Where the edge of each step is. Use tools that help you move around (mobility aids) if they are needed. These include: Canes. Walkers. Scooters. Crutches. Turn on the lights when you go into a dark area. Replace any light bulbs as soon as they burn out. Set up your furniture so you have a clear path. Avoid moving your furniture around. If any of your floors are uneven, fix them. If there are any pets around you, be aware of where they are. Review your medicines with your doctor. Some medicines can make you feel dizzy. This can increase your chance of falling. Ask your doctor what other things that you can do to help prevent falls. This information is not intended to replace advice given to you by your health care provider. Make sure you discuss any questions you have with your health care provider. Document Released: 01/08/2009 Document Revised: 08/20/2015 Document Reviewed: 04/18/2014 Elsevier Interactive Patient Education  2017 [employer].

## 2021-02-10 ENCOUNTER — Ambulatory Visit: Payer: Medicare Other

## 2021-02-11 ENCOUNTER — Telehealth: Payer: Medicare Other

## 2021-03-05 ENCOUNTER — Other Ambulatory Visit: Payer: Self-pay

## 2021-03-05 MED ORDER — APIXABAN 5 MG PO TABS: 5.0000 mg | ORAL_TABLET | Freq: Two times a day (BID) | ORAL | 3 refills | Status: DC

## 2021-03-11 ENCOUNTER — Encounter: Payer: Self-pay | Admitting: Family Medicine

## 2021-03-11 ENCOUNTER — Telehealth (INDEPENDENT_AMBULATORY_CARE_PROVIDER_SITE_OTHER): Payer: Medicare Other | Admitting: Family Medicine

## 2021-03-11 DIAGNOSIS — L719 Rosacea, unspecified: Secondary | ICD-10-CM

## 2021-03-11 MED ORDER — METRONIDAZOLE 0.75 % EX CREA: TOPICAL_CREAM | Freq: Two times a day (BID) | CUTANEOUS | 0 refills | Status: DC

## 2021-03-11 NOTE — Progress Notes (Signed)
Virtual Visit via Video Note  I connected with Alexa Brown on 03/11/21 at  8:30 AM EST by a video enabled telemedicine application 2/2 [id number] pandemic and verified that I am speaking with the correct person using two identifiers.  Location patient: home Location provider:work or home office Persons participating in the virtual visit: patient, provider,  pt's daughter  I discussed the limitations of evaluation and management by telemedicine and the availability of in person appointments. The patient expressed understanding and agreed to proceed.   HPI: Pt with inflammation on cheeks x 1 day. States face became hot, erythematous, with burning sensation yesterday.  Patient endorses history of rosacea but is never been this bad in the past.  Patient denies being out in the sun or cold weather yesterday, increased caffeine intake, or eating spicy foods.  Using gentle cleansers on her face.   ROS: See pertinent positives and negatives per HPI.  Past Medical History:  Diagnosis Date   Chicken pox    Depression    Diabetes mellitus without complication (HCC)    GERD (gastroesophageal reflux disease)    Hyperlipidemia    Hypertension    Stroke ([place])    UTI (urinary tract infection)     Past Surgical History:  Procedure Laterality Date   ABDOMINAL HYSTERECTOMY     APPENDECTOMY     BREAST BIOPSY Left 2014   BREAST SURGERY     biopsy    Family History  Problem Relation Age of Onset   Hyperlipidemia Mother    Heart disease Mother    Stroke Mother    Hypertension Mother    Hyperlipidemia Father    Heart disease Father    Stroke Father    Hypertension Father    Asthma Father    Parkinson's disease Sister     Current Outpatient Medications:    ACCU-CHEK AVIVA PLUS test strip, USE TO TEST BLOOD SUGAR ONCE DAILY, Disp: 100 strip, Rfl: 1   Accu-Chek Softclix Lancets lancets, Use to check blood sugar once daily, Disp: 100 each, Rfl: 2   acetaminophen (TYLENOL) 325 MG  tablet, Take 650 mg by mouth every 6 (six) hours as needed for mild pain, fever or headache., Disp: , Rfl:    allopurinol (ZYLOPRIM) 100 MG tablet, TAKE ONE TABLET BY MOUTH TWICE A DAY, Disp: 180 tablet, Rfl: 1   Alpha-Lipoic Acid 600 MG TABS, Take 600 mg by mouth daily., Disp: , Rfl:    apixaban (ELIQUIS) 5 MG TABS tablet, Take 1 tablet (5 mg total) by mouth 2 (two) times daily., Disp: 180 tablet, Rfl: 3   buPROPion ER (WELLBUTRIN SR) 100 MG 12 hr tablet, TAKE ONE TABLET BY MOUTH TWICE A DAY, Disp: 180 tablet, Rfl: 1   colchicine 0.6 MG tablet, TAKE ONE TABLET BY MOUTH TWICE A DAY (Patient taking differently: Take 0.6 mg by mouth 2 (two) times daily.), Disp: 30 tablet, Rfl: 0   DULoxetine (CYMBALTA) 60 MG capsule, TAKE ONE CAPSULE BY MOUTH DAILY, Disp: 90 capsule, Rfl: 1   ezetimibe (ZETIA) 10 MG tablet, Take 1 tablet (10 mg total) by mouth daily., Disp: 90 tablet, Rfl: 2   Homeopathic Products (THERAWORX RELIEF EX), Apply 1 application topically daily as needed (foot cramps)., Disp: , Rfl:    ketoconazole (NIZORAL) 2 % cream, SMARTSIG:1 Application Topical 1 to 2 Times Daily, Disp: , Rfl:    Lancet Devices (ACCU-CHEK SOFTCLIX) lancets, Use to test blood sugar once a day., Disp: 100 each, Rfl: 2   Multiple Vitamin (  MULTIVITAMIN) capsule, Take 1 capsule by mouth daily. , Disp: , Rfl:    olmesartan (BENICAR) 20 MG tablet, Take 1 tablet (20 mg total) by mouth daily., Disp: 90 tablet, Rfl: 2   pantoprazole (PROTONIX) 20 MG tablet, TAKE ONE TABLET BY MOUTH DAILY, Disp: 90 tablet, Rfl: 2  EXAM:  VITALS per patient if applicable: RR between 93-55 bpm  GENERAL: alert, oriented, appears well and in no acute distress  HEENT: atraumatic, conjunctiva clear, no obvious abnormalities on inspection of external nose and ears  NECK: normal movements of the head and neck  LUNGS: on inspection no signs of respiratory distress, breathing rate appears normal, no obvious gross SOB, gasping or wheezing  CV: no  obvious cyanosis  Skin: Erythema of bilateral cheeks, R>L, with possible pustule on right cheek, though difficult to fully appreciate via video.  MS: moves all visible extremities without noticeable abnormality  PSYCH/NEURO: pleasant and cooperative, no obvious depression or anxiety, speech and thought processing grossly intact  ASSESSMENT AND PLAN:  Discussed the following assessment and plan:  Rosacea -Patient encouraged to continue using gentle soaps and moisturizers on face -Start metronidazole cream twice daily.  Apply to clean face -Follow-up with PCP for continued or worsening symptoms. - Plan: metroNIDAZOLE (METROCREAM) 0.75 % cream  F/u prn   I discussed the assessment and treatment plan with the patient. The patient was provided an opportunity to ask questions and all were answered. The patient agreed with the plan and demonstrated an understanding of the instructions.   The patient was advised to call back or seek an in-person evaluation if the symptoms worsen or if the condition fails to improve as anticipated.  Billie Ruddy, MD

## 2021-03-12 ENCOUNTER — Telehealth: Payer: Self-pay

## 2021-03-12 NOTE — Telephone Encounter (Signed)
Soni from [facility] called stating a PAD screening was done on patient both legs are abnormal call back # [phone]

## 2021-03-12 NOTE — Telephone Encounter (Signed)
I left Soni a voicemail asking for her to fax over the report for Dr. Martinique to review.

## 2021-03-30 NOTE — Progress Notes (Signed)
HPI: [name] is a 81 y.o. female, who is here today with her daughter for chronic disease management.  Last seen on 01/19/21. No new problems since her last visit.  Hypertension:  Medications: Benicar 20 mg daily. BP readings at home: 120s/70s. Side effects: None  Negative for unusual or severe headache, visual changes, exertional chest pain, dyspnea,  focal weakness, or edema.  Lab Results  Component Value Date   CREATININE 1.01 01/19/2021   BUN 17 01/19/2021   NA 137 01/19/2021   K 4.5 01/19/2021   CL 104 01/19/2021   CO2 25 01/19/2021   Hyperlipidemia: Currently on Zetia 10 mg daily. Following a low fat diet: Yes. Side effects from medication: None. CVA and aortic atherosclerosis, she has not tolerated statins in the past. She recently had a nurse visit from her health insurance, PAD screening was abnormal. Right leg 0.76 and left leg 0.42. She denies lower extremity pain/claudication-like symptoms.  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  Lab Results  Component Value Date   CHOL 263 (H) 02/11/2020   HDL 77 02/11/2020   LDLCALC 164 (H) 02/11/2020   TRIG 106 02/11/2020   CHOLHDL 3.4 02/11/2020   Diabetes Mellitus II:  - Checking BG at home: Low 100s. She is on nonpharmacologic treatment. Eye exam is current. - foot exam: 07/2020.  - Negative for symptoms of hypoglycemia, polyuria, polydipsia,foot ulcers/trauma  She would like to see another eye care provider, she usually has to wait long hours and her daughter is the one who drives her, interfere with her schedule.  Lab Results  Component Value Date   HGBA1C 5.5 11/02/2020   Lab Results  Component Value Date   MICROALBUR 1.1 07/14/2020   Depression and anxiety: Currently she is on Cymbalta 60 mg daily and Wellbutrin SR 100 mg twice daily. She still feels like medication is helping. Since the death of her husband she feels sad around the  holidays.  Depression screen [facility] 2/9 03/31/2021 02/09/2021 08/31/2020 02/05/2020 08/07/2019  Decreased Interest 2 3 3  0 3  Down, Depressed, Hopeless 2 0 2 0 2  PHQ - 2 Score 4 3 5  0 5  Altered sleeping 2 3 3  - 2  Tired, decreased energy 2 0 3 - 3  Change in appetite 2 0 2 - 3  Feeling bad or failure about yourself  2 2 3  - 3  Trouble concentrating 1 0 1 - 2  Moving slowly or fidgety/restless 1 0 0 - 1  Suicidal thoughts 1 0 0 - 1  PHQ-9 Score 15 8 17  - 20  Difficult doing work/chores Somewhat difficult Not difficult at all Somewhat difficult - Somewhat difficult   Review of Systems  Constitutional:  Positive for fatigue. Negative for activity change, appetite change and fever.  HENT:  Negative for mouth sores, nosebleeds and sore throat.   Respiratory:  Negative for cough and wheezing.   Gastrointestinal:  Negative for abdominal pain, nausea and vomiting.       Negative for changes in bowel habits.  Genitourinary:  Negative for decreased urine volume, dysuria and hematuria.  Musculoskeletal:  Positive for gait problem.  Neurological:  Negative for syncope, facial asymmetry and weakness.  Psychiatric/Behavioral:  Negative for confusion. The patient is nervous/anxious.   Rest of ROS see pertinent positives and negatives in HPI.  Current Outpatient Medications on File Prior to Visit  Medication Sig Dispense Refill   ACCU-CHEK AVIVA PLUS test  strip USE TO TEST BLOOD SUGAR ONCE DAILY 100 strip 1   Accu-Chek Softclix Lancets lancets Use to check blood sugar once daily 100 each 2   acetaminophen (TYLENOL) 325 MG tablet Take 650 mg by mouth every 6 (six) hours as needed for mild pain, fever or headache.     allopurinol (ZYLOPRIM) 100 MG tablet TAKE ONE TABLET BY MOUTH TWICE A DAY 180 tablet 1   Alpha-Lipoic Acid 600 MG TABS Take 600 mg by mouth daily.     apixaban (ELIQUIS) 5 MG TABS tablet Take 1 tablet (5 mg total) by mouth 2 (two) times daily. 180 tablet 3   buPROPion ER (WELLBUTRIN SR) 100  MG 12 hr tablet TAKE ONE TABLET BY MOUTH TWICE A DAY 180 tablet 1   colchicine 0.6 MG tablet TAKE ONE TABLET BY MOUTH TWICE A DAY (Patient taking differently: Take 0.6 mg by mouth 2 (two) times daily.) 30 tablet 0   DULoxetine (CYMBALTA) 60 MG capsule TAKE ONE CAPSULE BY MOUTH DAILY 90 capsule 1   ezetimibe (ZETIA) 10 MG tablet Take 1 tablet (10 mg total) by mouth daily. 90 tablet 2   Homeopathic Products (THERAWORX RELIEF EX) Apply 1 application topically daily as needed (foot cramps).     ketoconazole (NIZORAL) 2 % cream SMARTSIG:1 Application Topical 1 to 2 Times Daily     Lancet Devices (ACCU-CHEK SOFTCLIX) lancets Use to test blood sugar once a day. 100 each 2   metroNIDAZOLE (METROCREAM) 0.75 % cream Apply topically 2 (two) times daily. Apply a thin layer twice a day to a clean dry face. 45 g 0   Multiple Vitamin (MULTIVITAMIN) capsule Take 1 capsule by mouth daily.      olmesartan (BENICAR) 20 MG tablet Take 1 tablet (20 mg total) by mouth daily. 90 tablet 2   pantoprazole (PROTONIX) 20 MG tablet TAKE ONE TABLET BY MOUTH DAILY 90 tablet 2   No current facility-administered medications on file prior to visit.   Past Medical History:  Diagnosis Date   Chicken pox    Depression    Diabetes mellitus without complication (HCC)    GERD (gastroesophageal reflux disease)    Hyperlipidemia    Hypertension    Stroke [facility])    UTI (urinary tract infection)    No Known Allergies  Social History   Socioeconomic History   Marital status: Widowed    Spouse name: Not on file   Number of children: Not on file   Years of education: Not on file   Highest education level: Not on file  Occupational History   Occupation: N/A  Tobacco Use   Smoking status: Never   Smokeless tobacco: Never  Vaping Use   Vaping Use: Never used  Substance and Sexual Activity   Alcohol use: Yes    Comment: occassional wine   Drug use: No   Sexual activity: Not Currently  Other Topics Concern   Not on file   Social History Narrative   Pt lives in single story home with her daughter   Has 2 children   Some college education   Retired Engineer, production for [employer]   Social Determinants of Health   Financial Resource Strain: Low Risk    Difficulty of Paying Living Expenses: Not hard at all  Food Insecurity: No Food Insecurity   Worried About Charity fundraiser in the Last Year: Never true   Arboriculturist in the Last Year: Never true  Transportation Needs: No Transportation  Needs   Lack of Transportation (Medical): No   Lack of Transportation (Non-Medical): No  Physical Activity: Inactive   Days of Exercise per Week: 0 days   Minutes of Exercise per Session: 0 min  Stress: No Stress Concern Present   Feeling of Stress : Not at all  Social Connections: Not on file   Vitals:   03/31/21 0951  BP: 124/70  Pulse: 96  Resp: 16  SpO2: 96%   Body mass index is 26.35 kg/m.  Physical Exam Vitals and nursing note reviewed.  Constitutional:      General: She is not in acute distress.    Appearance: She is well-developed.  HENT:     Head: Normocephalic and atraumatic.     Mouth/Throat:     Mouth: Mucous membranes are moist.     Pharynx: Oropharynx is clear.  Eyes:     Conjunctiva/sclera: Conjunctivae normal.  Cardiovascular:     Rate and Rhythm: Normal rate and regular rhythm.     Heart sounds: No murmur heard.    Comments: DP and PT pulses present bilateral. Pulmonary:     Effort: Pulmonary effort is normal. No respiratory distress.     Breath sounds: Normal breath sounds.  Abdominal:     Palpations: Abdomen is soft.     Tenderness: There is no abdominal tenderness.  Lymphadenopathy:     Cervical: No cervical adenopathy.  Skin:    General: Skin is warm.     Findings: No erythema or rash.  Neurological:     General: No focal deficit present.     Mental Status: She is alert and oriented to person, place, and time.     Cranial Nerves: No cranial nerve deficit.      Comments: Mildly unstable gait, assisted by a cane.  Psychiatric:     Comments: Well groomed, good eye contact.   ASSESSMENT AND PLAN:  Ms.Fallyn was seen today for follow-up.  Diagnoses and all orders for this visit: Orders Placed This Encounter  Procedures   POC HgB A1c   VAS [place] ABI WITH/WO TBI   PAD (peripheral artery disease) ([place]) She has not tolerated statins. Continue Zetia. ABI will be arranged.  Systolic hypertension with cerebrovascular disease BP adequately controlled. Continue current management: Benicar 20 mg daily. DASH/low salt diet to continue. Continue monitoring BP at home. Eye exam is current.  DM (diabetes mellitus), type 2 with neurological complications ([place]) DDU2G still at goal, went from 5.5 to 6.1 today. Continue non pharmacologic treatment. Annual eye exam, periodic dental and foot care to continue. She would like to establish with Dr Katy Fitch for eye exams, will let me know if she needs referral. F/U in 5-6 months   Depression, major, recurrent ([place]) Problem exacerbated during holidays, when she usually remembers her husband. In general she feels like medications are helping and agrees with no changes today.  Hyperlipidemia associated with type 2 diabetes mellitus ([place]) She is not fasting today. Ideal LDL goal < 70. Continue Zetia 10 mg daily. Has not tolerated statins and not willing to try a new one. Will plan on FLP next visit.  Return in about 6 months (around 09/28/2021).  Srinika G. Martinique, MD  [facility]. [place] office.

## 2021-03-31 ENCOUNTER — Ambulatory Visit (INDEPENDENT_AMBULATORY_CARE_PROVIDER_SITE_OTHER): Payer: Medicare Other | Admitting: Family Medicine

## 2021-03-31 ENCOUNTER — Encounter: Payer: Self-pay | Admitting: Family Medicine

## 2021-03-31 VITALS — BP 124/70 | HR 96 | Resp 16 | Ht 66.0 in | Wt 163.2 lb

## 2021-03-31 DIAGNOSIS — E1169 Type 2 diabetes mellitus with other specified complication: Secondary | ICD-10-CM

## 2021-03-31 DIAGNOSIS — E785 Hyperlipidemia, unspecified: Secondary | ICD-10-CM

## 2021-03-31 DIAGNOSIS — I674 Hypertensive encephalopathy: Secondary | ICD-10-CM | POA: Diagnosis not present

## 2021-03-31 DIAGNOSIS — F332 Major depressive disorder, recurrent severe without psychotic features: Secondary | ICD-10-CM

## 2021-03-31 DIAGNOSIS — E1149 Type 2 diabetes mellitus with other diabetic neurological complication: Secondary | ICD-10-CM

## 2021-03-31 DIAGNOSIS — I739 Peripheral vascular disease, unspecified: Secondary | ICD-10-CM | POA: Diagnosis not present

## 2021-03-31 LAB — POCT GLYCOSYLATED HEMOGLOBIN (HGB A1C): HbA1c, POC (prediabetic range): 6.1 % (ref 5.7–6.4)

## 2021-03-31 NOTE — Assessment & Plan Note (Addendum)
HgA1C still at goal, went from 5.5 to 6.1 today. Continue non pharmacologic treatment. Annual eye exam, periodic dental and foot care to continue. She would like to establish with Dr Katy Fitch for eye exams, will let me know if she needs referral. F/U in 5-6 months

## 2021-03-31 NOTE — Assessment & Plan Note (Signed)
BP adequately controlled. Continue current management: Benicar 20 mg daily. DASH/low salt diet to continue. Continue monitoring BP at home. Eye exam is current.

## 2021-03-31 NOTE — Assessment & Plan Note (Signed)
Problem exacerbated during holidays, when she usually remembers her husband. In general she feels like medications are helping and agrees with no changes today.

## 2021-03-31 NOTE — Patient Instructions (Addendum)
A few things to remember from today's visit:   Hyperlipidemia associated with type 2 diabetes mellitus ([place])  DM (diabetes mellitus), type 2 with neurological complications ([place]) - Plan: POC HgB A1c  Severe episode of recurrent major depressive disorder, without psychotic features ([place])  Systolic hypertension with cerebrovascular disease  PAD (peripheral artery disease) ([place]) - Plan: VAS [place] ABI WITH/WO TBI  If you need refills please call your pharmacy. Do not use My Chart to request refills or for acute issues that need immediate attention.   No changes today. Circulation test will be arranged. Diabetes still well controlled. Will plan on fasting labs next visit.  Please be sure medication list is accurate. If a new problem present, please set up appointment sooner than planned today.

## 2021-03-31 NOTE — Assessment & Plan Note (Signed)
She is not fasting today. Ideal LDL goal < 70. Continue Zetia 10 mg daily. Has not tolerated statins and not willing to try a new one. Will plan on FLP next visit.

## 2021-04-02 ENCOUNTER — Telehealth: Payer: Self-pay | Admitting: Pharmacist

## 2021-04-02 NOTE — Chronic Care Management (AMB) (Signed)
° ° °  Chronic Care Management Pharmacy Assistant   Name: Alexa Brown  MRN: [id number] DOB: 1940/08/04  04/05/2021 APPOINTMENT REMINDER   Called Alexa Brown, No answer, left message of appointment on 04/05/2021 at 1:45 via telephone visit with [name], Pharm D. Notified to have all medications, supplements, blood pressure and/or blood sugar logs available during appointment and to return call if need to reschedule.  Care Gaps: AWV - completed 02/09/2021 Last BP - 124/70 on 03/31/2021 Last A1C - result not available - done on 03/31/2021  Star Rating Drug: Olmesartan 20 mg - Last filled 02/26/2021 90 DS at [facility]  Any gaps in medications fill history? No  [name] [facility]  Catering manager [phone]

## 2021-04-05 ENCOUNTER — Telehealth: Payer: Medicare Other

## 2021-04-05 ENCOUNTER — Telehealth: Payer: Self-pay | Admitting: Pharmacist

## 2021-04-05 NOTE — Progress Notes (Deleted)
Chronic Care Management Pharmacy Note  04/05/2021 Name:  Alexa Brown MRN:  [id number] DOB:  [birth year]  Summary: BP is at goal of < 140/90 at home LDL is not at goal < 70  Recommendations/Changes made from today's visit: -Recommended for patient to stop fish oil and separate vitamin D supplementation due to current amount from multivitamin -Recommend repeat lipid panel and uric acid level  Plan: Follow up in 4 months  Subjective: Alexa Brown is an 81 y.o. year old female who is a primary patient of Martinique, Malka So, MD.  The CCM team was consulted for assistance with disease management and care coordination needs.    Engaged with patient by telephone for follow up visit in response to provider referral for pharmacy case management and/or care coordination services.   Consent to Services:  The patient was given information about Chronic Care Management services, agreed to services, and gave verbal consent prior to initiation of services.  Please see initial visit note for detailed documentation.   Patient Care Team: Martinique, Vanassa G, MD as PCP - General (Family Medicine) Viona Gilmore, [facility] as Pharmacist (Pharmacist)  Recent office visits: 03/31/20 [name], MD: Patient presented for HLD follow up. No medication changes.  03/11/21 Grier Mitts, MD: Patient presented for video visit for rosacea. Prescribed metronidazole cream BID.  02/09/21 Glenna Durand, LPN: Patient presented for AWV.  01/19/21 Michele Martinique, MD: Patient presented for follow up. No medication changes.  11-02-2020 Martinique, Laneshia G, MD - Patient presented for DM (diabetes mellitus), type 2 with neurological complications and other concerns. No medication changes.  Recent consult visits: 02/05/21 Acquanetta Sit, DPM (podiatry): Patient presented for nail debridement.  11-02-2020 Marzetta Board, DPM - Patient presented for Pain due to onychomycosis of toenails of both feet. No  medication changes.   09/21/20 Annice Pih, DO ([facility]): Patient presented for eye exam.  Hospital visits: None  Objective:  Lab Results  Component Value Date   CREATININE 1.01 01/19/2021   BUN 17 01/19/2021   GFR 53.29 (L) 01/19/2021   GFRNONAA >60 07/27/2020   GFRAA 63 02/11/2020   NA 137 01/19/2021   K 4.5 01/19/2021   CALCIUM 10.2 01/19/2021   CO2 25 01/19/2021   GLUCOSE 104 (H) 01/19/2021    Lab Results  Component Value Date/Time   HGBA1C 6.1 03/31/2021 10:05 AM   HGBA1C 5.5 11/02/2020 08:56 AM   HGBA1C 6.2 07/14/2020 09:17 AM   HGBA1C 6.0 (H) 02/11/2020 09:05 AM   HGBA1C 5.7 07/27/2015 12:00 AM   HGBA1C 5.9 03/27/2015 12:00 AM   FRUCTOSAMINE 253 12/18/2018 09:23 AM   GFR 53.29 (L) 01/19/2021 03:30 PM   GFR 49.89 (L) 07/30/2020 11:58 AM   MICROALBUR 1.1 07/14/2020 09:17 AM   MICROALBUR 0.8 01/01/2019 10:56 AM    Last diabetic Eye exam: No results found for: HMDIABEYEEXA  Last diabetic Foot exam: No results found for: HMDIABFOOTEX   Lab Results  Component Value Date   CHOL 263 (H) 02/11/2020   HDL 77 02/11/2020   LDLCALC 164 (H) 02/11/2020   TRIG 106 02/11/2020   CHOLHDL 3.4 02/11/2020    Hepatic Function Latest Ref Rng & Units 07/27/2020 07/26/2020 07/25/2020  Total Protein 6.5 - 8.1 g/dL - - 6.4(L)  Albumin 3.5 - 5.0 g/dL 3.4(L) 3.6 3.4(L)  AST 15 - 41 U/L - - 18  ALT 0 - 44 U/L - - 14  Alk Phosphatase 38 - 126 U/L - - 45  Total Bilirubin  0.3 - 1.2 mg/dL - - 0.2(L)    Lab Results  Component Value Date/Time   TSH 2.87 12/18/2018 09:23 AM   TSH 1.41 10/30/2017 12:00 AM    CBC Latest Ref Rng & Units 07/30/2020 07/27/2020 07/26/2020  WBC 4.0 - 10.5 K/uL 5.3 5.2 6.4  Hemoglobin 12.0 - 15.0 g/dL 14.4 13.6 14.0  Hematocrit 36.0 - 46.0 % 42.8 41.1 42.0  Platelets 150.0 - 400.0 K/uL 253.0 223 219    Lab Results  Component Value Date/Time   VD25OH 67 07/21/2017 04:02 PM   VD25OH 43.34 01/23/2017 01:08 PM   VD25OH 54.05 01/14/2016 03:49 PM    Clinical  ASCVD: Yes  The ASCVD Risk score (Arnett DK, et al., 2019) failed to calculate for the following reasons:   The patient has a prior MI or stroke diagnosis    Depression screen [facility] 2/9 03/31/2021 02/09/2021 08/31/2020  Decreased Interest _0 Down, Depressed, Hopeless 2 0 2  PHQ - 2 Score _1 Altered sleeping _2 Tired, decreased energy 2 0 3  Change in appetite 2 0 2  Feeling bad or failure about yourself  _3 Trouble concentrating 1 0 1  Moving slowly or fidgety/restless 1 0 0  Suicidal thoughts 1 0 0  PHQ-9 Score _4 Difficult doing work/chores Somewhat difficult Not difficult at all Somewhat difficult      Social History   Tobacco Use  Smoking Status Never  Smokeless Tobacco Never   BP Readings from Last 3 Encounters:  03/31/21 124/70  01/19/21 126/70  11/02/20 136/70   Pulse Readings from Last 3 Encounters:  03/31/21 96  01/19/21 96  11/02/20 87   Wt Readings from Last 3 Encounters:  03/31/21 163 lb 4 oz (74 kg)  02/09/21 161 lb (73 kg)  01/19/21 161 lb 4 oz (73.1 kg)   BMI Readings from Last 3 Encounters:  03/31/21 26.35 kg/m  02/09/21 25.99 kg/m  01/19/21 25.26 kg/m    Assessment/Interventions: Review of patient past medical history, allergies, medications, health status, including review of consultants reports, laboratory and other test data, was performed as part of comprehensive evaluation and provision of chronic care management services.   SDOH:  (Social Determinants of Health) assessments and interventions performed: No  SDOH Screenings   Alcohol Screen: Not on file  Depression (PHQ2-9): Medium Risk   PHQ-2 Score: 15  Financial Resource Strain: Low Risk    Difficulty of Paying Living Expenses: Not hard at all  Food Insecurity: No Food Insecurity   Worried About Charity fundraiser in the Last Year: Never true   Ran Out of Food in the Last Year: Never true  Housing: Not on file  Physical Activity: Inactive   Days of Exercise per  Week: 0 days   Minutes of Exercise per Session: 0 min  Social Connections: Not on file  Stress: No Stress Concern Present   Feeling of Stress : Not at all  Tobacco Use: Low Risk    Smoking Tobacco Use: Never   Smokeless Tobacco Use: Never   Passive Exposure: Not on file  Transportation Needs: No Transportation Needs   Lack of Transportation (Medical): No   Lack of Transportation (Non-Medical): No    CCM Care Plan  No Known Allergies  Medications Reviewed Today     Reviewed by Martinique, Kendallyn G, MD (Physician) on 03/31/21 at 1756  Med List Status: <None>   Medication Order Taking?  Sig Documenting Provider Last Dose Status Informant  ACCU-CHEK AVIVA PLUS test strip [id number] Yes USE TO TEST BLOOD SUGAR ONCE DAILY Martinique, Kamira G, MD Taking Active Child  Accu-Chek Softclix Lancets lancets [id number] Yes Use to check blood sugar once daily Martinique, Robin G, MD Taking Active Child  acetaminophen (TYLENOL) 325 MG tablet [id number] Yes Take 650 mg by mouth every 6 (six) hours as needed for mild pain, fever or headache. [provider] Taking Active Child  allopurinol (ZYLOPRIM) 100 MG tablet [id number] Yes TAKE ONE TABLET BY MOUTH TWICE A DAY Martinique, [place] G, MD Taking Active   Alpha-Lipoic Acid 600 MG TABS [id number] Yes Take 600 mg by mouth daily. [provider] Taking Active Child  apixaban (ELIQUIS) 5 MG TABS tablet [id number] Yes Take 1 tablet (5 mg total) by mouth 2 (two) times daily. Martinique, Zerline G, MD Taking Active   buPROPion ER [facility] SR) 100 MG 12 hr tablet [id number] Yes TAKE ONE TABLET BY MOUTH TWICE A DAY Martinique, Arlys G, MD Taking Active   colchicine 0.6 MG tablet [id number] Yes TAKE ONE TABLET BY MOUTH TWICE A DAY  Patient taking differently: Take 0.6 mg by mouth 2 (two) times daily.   Martinique, Leiah G, MD Taking Active   DULoxetine (CYMBALTA) 60 MG capsule [id number] Yes TAKE ONE CAPSULE BY MOUTH DAILY Martinique, Mabry G, MD Taking Active   ezetimibe (ZETIA) 10  MG tablet [id number] Yes Take 1 tablet (10 mg total) by mouth daily. Martinique, Izzabell G, MD Taking Active   Homeopathic Products ([place]) [id number] Yes Apply 1 application topically daily as needed (foot cramps). [provider] Taking Active Child  ketoconazole (NIZORAL) 2 % cream [id number] Yes SMARTSIG:1 Application Topical 1 to 2 Times Daily [provider] Taking Active   Lancet Devices ([place]) lancets [id number] Yes Use to test blood sugar once a day. Martinique, Chianne G, MD Taking Active Child  metroNIDAZOLE (METROCREAM) 0.75 % cream [id number] Yes Apply topically 2 (two) times daily. Apply a thin layer twice a day to a clean dry face. Billie Ruddy, MD Taking Active   Multiple Vitamin (MULTIVITAMIN) capsule [id number] Yes Take 1 capsule by mouth daily.  [provider] Taking Active Child  olmesartan (BENICAR) 20 MG tablet [id number] Yes Take 1 tablet (20 mg total) by mouth daily. Martinique, Collyn G, MD Taking Active   pantoprazole ([place]) 20 MG tablet [id number] Yes TAKE ONE TABLET BY MOUTH DAILY Martinique, Ajah G, MD Taking Active             Patient Active Problem List   Diagnosis Date Noted   Atherosclerosis of aorta ([place]) 01/19/2021   (HFpEF) heart failure with preserved ejection fraction ([place]) 07/30/2020   Acute pulmonary embolism ([place]) 07/24/2020   Diplopia 04/09/2020   Epiretinal membrane (ERM) of left eye 04/09/2020   Pseudophakia of both eyes 04/09/2020   Myalgia due to statin 02/11/2020   Posterior capsular opacification of both eyes, obscuring vision 10/17/2019   Elbow dislocation 07/02/2019   Generalized osteoarthritis of multiple sites 06/26/2018   Incontinent of urine 04/28/2018   Gout, arthropathy 01/22/2018   Lower back pain 09/22/2017   Unstable gait 09/22/2017   Seborrheic dermatitis 01/23/2017   Seborrheic keratosis 09/27/2016   Falling asleep and waking up too early in the evening 08/19/2016   CKD (chronic kidney  disease), stage III ([place]) 05/28/2016   GERD (gastroesophageal reflux disease) 02/25/2016   CVA (cerebral vascular accident) (HCC)-No residual deficit 02/12/2016   Glaucoma  02/12/2016   Depression, major, recurrent ([place]) 02/12/2016   Hyperlipidemia associated with type 2 diabetes mellitus ([place]) 02/12/2016   Vitamin D deficiency 01/14/2016   DM (diabetes mellitus), type 2 with neurological complications ([place]) 60/16/5800   Systolic hypertension with cerebrovascular disease 01/14/2016   Peripheral neuropathy 01/14/2016   Acne rosacea 01/28/2013   Thyroid activity decreased 03/28/2009    Immunization History  Administered Date(s) Administered   Hepatitis B, adult 01/22/2018   Influenza Whole 02/01/2016   Influenza, High Dose Seasonal PF 01/14/2016, 11/17/2018   Influenza-Unspecified 01/12/2017, 12/26/2017, 12/13/2019   PFIZER(Purple Top)SARS-COV-2 Vaccination 05/03/2019, 05/28/2019, 01/11/2020   Tdap 12/24/2015   Zoster Recombinat (Shingrix) 08/16/2016, 01/12/2017    Conditions to be addressed/monitored:  Hypertension, Hyperlipidemia, Diabetes, GERD, Chronic Kidney Disease, Depression, Osteoarthritis and Gout  Conditions addressed this visit: Hypertension, hyperlipidemia, depression***  There are no care plans that you recently modified to display for this patient.    Medication Assistance: None required.  Patient affirms current coverage meets needs.  Compliance/Adherence/Medication fill history: Care Gaps: Last BP - 124/70 on 03/31/2021  Star-Rating Drugs: Olmesartan 20 mg - Last filled 02/26/2021 [facility] at [facility]  Patient's preferred pharmacy is:  [facility] [id number] - [place], [place] [address] [place] [address] Phone: [phone] Fax: [phone]  [facility] [place], [place] - [place] [facility] [address] [place] [address] Phone: [phone] Fax:  [phone]   Uses pill box? Yes - daughter fills for her Pt endorses 100% compliance  We discussed: Current pharmacy is preferred with insurance plan and patient is satisfied with pharmacy services Patient decided to: Continue current medication management strategy  Care Plan and Follow Up Patient Decision:  Patient agrees to Care Plan and Follow-up.  Plan: Telephone follow up appointment with care management team member scheduled for:  3 months  [name], PharmD [place] Pharmacist [place] at [place] [phone]

## 2021-04-05 NOTE — Telephone Encounter (Signed)
°  Chronic Care Management   Outreach Note  04/05/2021 Name: Alexa Brown MRN: [id number] DOB: 1940/06/09  Referred by: Martinique, Rameen G, MD  Patient had a phone appointment scheduled with clinical pharmacist today.  An unsuccessful telephone outreach was attempted today. The patient was referred to the pharmacist for assistance with care management and care coordination.   If possible, a message was left to return call to: [phone] or to [facility]: [place], PharmD, [place] at [place]

## 2021-04-08 ENCOUNTER — Ambulatory Visit (HOSPITAL_COMMUNITY)
Admission: RE | Admit: 2021-04-08 | Discharge: 2021-04-08 | Disposition: A | Discharge status: Home / Self Care | Payer: Medicare Other | Source: Ambulatory Visit | Attending: Family Medicine | Admitting: Family Medicine

## 2021-04-08 ENCOUNTER — Other Ambulatory Visit: Payer: Self-pay

## 2021-04-08 DIAGNOSIS — I739 Peripheral vascular disease, unspecified: Secondary | ICD-10-CM | POA: Insufficient documentation

## 2021-04-08 NOTE — Progress Notes (Signed)
ABI completed. Refer to "CV Proc" under chart review to view preliminary results.  04/08/2021 4:24 PM Kelby Aline., MHA, RVT, RDCS, RDMS

## 2021-04-19 ENCOUNTER — Other Ambulatory Visit: Payer: Self-pay

## 2021-04-19 DIAGNOSIS — I739 Peripheral vascular disease, unspecified: Secondary | ICD-10-CM

## 2021-04-27 DIAGNOSIS — D225 Melanocytic nevi of trunk: Secondary | ICD-10-CM | POA: Diagnosis not present

## 2021-04-27 DIAGNOSIS — Z85828 Personal history of other malignant neoplasm of skin: Secondary | ICD-10-CM | POA: Diagnosis not present

## 2021-04-27 DIAGNOSIS — L821 Other seborrheic keratosis: Secondary | ICD-10-CM | POA: Diagnosis not present

## 2021-04-27 DIAGNOSIS — Z08 Encounter for follow-up examination after completed treatment for malignant neoplasm: Secondary | ICD-10-CM | POA: Diagnosis not present

## 2021-04-27 DIAGNOSIS — L814 Other melanin hyperpigmentation: Secondary | ICD-10-CM | POA: Diagnosis not present

## 2021-05-05 ENCOUNTER — Encounter: Payer: Self-pay | Admitting: Vascular Surgery

## 2021-05-05 ENCOUNTER — Other Ambulatory Visit: Payer: Self-pay

## 2021-05-05 ENCOUNTER — Ambulatory Visit (INDEPENDENT_AMBULATORY_CARE_PROVIDER_SITE_OTHER): Payer: Medicare Other | Admitting: Vascular Surgery

## 2021-05-05 VITALS — BP 124/67 | HR 88 | Temp 98.5°F | Resp 20 | Ht 66.0 in | Wt 163.0 lb

## 2021-05-05 DIAGNOSIS — M25561 Pain in right knee: Secondary | ICD-10-CM

## 2021-05-05 DIAGNOSIS — M25562 Pain in left knee: Secondary | ICD-10-CM | POA: Diagnosis not present

## 2021-05-05 NOTE — Progress Notes (Signed)
Patient ID: Alexa Brown, female   DOB: 18-Aug-1940, 81 y.o.   MRN: [id number]  Reason for Consult: No chief complaint on file.   Referred by Martinique, Kajah G, MD  Subjective:     HPI:  Alexa Brown is a 81 y.o. female without significant vascular history.  She does have prediabetes, hyperlipidemia hypertension and a previous history of stroke.  She walks with a cane daily.  Currently her most activities crocheting.  She has never had any vascular disease requiring intervention.  She does have leg pain at night.  She states that this is aching in nature.  No tissue loss or ulceration.  She is a lifelong non-smoker.  She does not take antiplatelet agents or anticoagulants  Past Medical History:  Diagnosis Date   Chicken pox    Depression    Diabetes mellitus without complication (HCC)    GERD (gastroesophageal reflux disease)    Hyperlipidemia    Hypertension    Stroke ([place])    UTI (urinary tract infection)    Family History  Problem Relation Age of Onset   Hyperlipidemia Mother    Heart disease Mother    Stroke Mother    Hypertension Mother    Hyperlipidemia Father    Heart disease Father    Stroke Father    Hypertension Father    Asthma Father    Parkinson's disease Sister    Past Surgical History:  Procedure Laterality Date   ABDOMINAL HYSTERECTOMY     APPENDECTOMY     BREAST BIOPSY Left 2014   BREAST SURGERY     biopsy    Short Social History:  Social History   Tobacco Use   Smoking status: Never   Smokeless tobacco: Never  Substance Use Topics   Alcohol use: Yes    Comment: occassional wine    No Known Allergies  Current Outpatient Medications  Medication Sig Dispense Refill   ACCU-CHEK AVIVA PLUS test strip USE TO TEST BLOOD SUGAR ONCE DAILY 100 strip 1   Accu-Chek Softclix Lancets lancets Use to check blood sugar once daily 100 each 2   acetaminophen (TYLENOL) 325 MG tablet Take 650 mg by mouth every 6 (six) hours as needed for  mild pain, fever or headache.     allopurinol (ZYLOPRIM) 100 MG tablet TAKE ONE TABLET BY MOUTH TWICE A DAY 180 tablet 1   Alpha-Lipoic Acid 600 MG TABS Take 600 mg by mouth daily.     apixaban (ELIQUIS) 5 MG TABS tablet Take 1 tablet (5 mg total) by mouth 2 (two) times daily. 180 tablet 3   buPROPion ER (WELLBUTRIN SR) 100 MG 12 hr tablet TAKE ONE TABLET BY MOUTH TWICE A DAY 180 tablet 1   colchicine 0.6 MG tablet TAKE ONE TABLET BY MOUTH TWICE A DAY (Patient taking differently: Take 0.6 mg by mouth 2 (two) times daily.) 30 tablet 0   DULoxetine (CYMBALTA) 60 MG capsule TAKE ONE CAPSULE BY MOUTH DAILY 90 capsule 1   ezetimibe (ZETIA) 10 MG tablet Take 1 tablet (10 mg total) by mouth daily. 90 tablet 2   Homeopathic Products (THERAWORX RELIEF EX) Apply 1 application topically daily as needed (foot cramps).     ketoconazole (NIZORAL) 2 % cream SMARTSIG:1 Application Topical 1 to 2 Times Daily     Lancet Devices (ACCU-CHEK SOFTCLIX) lancets Use to test blood sugar once a day. 100 each 2   metroNIDAZOLE (METROCREAM) 0.75 % cream Apply topically 2 (two) times daily. Apply a  thin layer twice a day to a clean dry face. 45 g 0   Multiple Vitamin (MULTIVITAMIN) capsule Take 1 capsule by mouth daily.      olmesartan (BENICAR) 20 MG tablet Take 1 tablet (20 mg total) by mouth daily. 90 tablet 2   pantoprazole (PROTONIX) 20 MG tablet TAKE ONE TABLET BY MOUTH DAILY 90 tablet 2   No current facility-administered medications for this visit.    Review of Systems  Constitutional:  Constitutional negative. HENT: HENT negative.  Eyes: Eyes negative.  Respiratory: Respiratory negative.  Cardiovascular: Cardiovascular negative.  GI: Gastrointestinal negative.  Musculoskeletal: Positive for leg pain.  Skin: Skin negative.  Neurological: Neurological negative. Hematologic: Hematologic/lymphatic negative.  Psychiatric: Psychiatric negative.       Objective:  Objective  Vitals:   05/05/21 1428  BP:  124/67  Pulse: 88  Resp: 20  Temp: 98.5 F (36.9 C)  SpO2: (!) 88%    Physical Exam Constitutional:      Appearance: Normal appearance.  HENT:     Nose:     Comments: Wearing a mask Eyes:     Pupils: Pupils are equal, round, and reactive to light.  Cardiovascular:     Rate and Rhythm: Normal rate.     Pulses: Normal pulses.  Pulmonary:     Effort: Pulmonary effort is normal.  Abdominal:     General: Abdomen is flat.     Palpations: Abdomen is soft.  Musculoskeletal:        General: Normal range of motion.     Right lower leg: No edema.     Left lower leg: No edema.  Skin:    General: Skin is warm.     Capillary Refill: Capillary refill takes less than 2 seconds.  Neurological:     General: No focal deficit present.     Mental Status: She is alert.    Data: ABI Findings:  +---------+------------------+-----+---------+--------+   Right     Rt Pressure (mmHg) Index Waveform  Comment    +---------+------------------+-----+---------+--------+   Brachial  143                                           +---------+------------------+-----+---------+--------+   PTA       146                1.01  triphasic            +---------+------------------+-----+---------+--------+   DP        150                1.03  triphasic            +---------+------------------+-----+---------+--------+   Great Toe 92                 0.63                       +---------+------------------+-----+---------+--------+   +---------+------------------+-----+---------+-------+   Left      Lt Pressure (mmHg) Index Waveform  Comment   +---------+------------------+-----+---------+-------+   Brachial  145                                          +---------+------------------+-----+---------+-------+   PTA       155  1.07  triphasic           +---------+------------------+-----+---------+-------+   DP        151                1.04  triphasic            +---------+------------------+-----+---------+-------+   Great Toe 58                 0.40                      +---------+------------------+-----+---------+-------+   +-------+-----------+-----------+------------+------------+   ABI/TBI Today's ABI Today's TBI Previous ABI Previous TBI   +-------+-----------+-----------+------------+------------+   Right   1.03        0.63                                    +-------+-----------+-----------+------------+------------+   Left    1.07        0.40                                    +-------+-----------+-----------+------------+------------+        Assessment/Plan:    81 year old female sent for evaluation bilateral foot pain which happens at night mostly.  No tissue loss or ulceration.  Does not appear to be vascular in nature.  She can follow-up with [place] on an as-needed basis.     Waynetta Sandy MD [facility]

## 2021-05-07 ENCOUNTER — Other Ambulatory Visit: Payer: Self-pay | Admitting: Family Medicine

## 2021-05-07 ENCOUNTER — Telehealth: Payer: Self-pay

## 2021-05-07 ENCOUNTER — Telehealth: Payer: Self-pay | Admitting: Pharmacist

## 2021-05-07 DIAGNOSIS — K219 Gastro-esophageal reflux disease without esophagitis: Secondary | ICD-10-CM

## 2021-05-07 DIAGNOSIS — F331 Major depressive disorder, recurrent, moderate: Secondary | ICD-10-CM

## 2021-05-07 DIAGNOSIS — G629 Polyneuropathy, unspecified: Secondary | ICD-10-CM

## 2021-05-07 DIAGNOSIS — I674 Hypertensive encephalopathy: Secondary | ICD-10-CM

## 2021-05-07 MED ORDER — BUPROPION HCL ER (SR) 100 MG PO TB12: 100.0000 mg | ORAL_TABLET | Freq: Two times a day (BID) | ORAL | 1 refills | Status: DC

## 2021-05-07 MED ORDER — EZETIMIBE 10 MG PO TABS: 10.0000 mg | ORAL_TABLET | Freq: Every day | ORAL | 2 refills | Status: DC

## 2021-05-07 MED ORDER — APIXABAN 5 MG PO TABS: 5.0000 mg | ORAL_TABLET | Freq: Two times a day (BID) | ORAL | 3 refills | Status: DC

## 2021-05-07 MED ORDER — ALLOPURINOL 100 MG PO TABS: 100.0000 mg | ORAL_TABLET | Freq: Two times a day (BID) | ORAL | 1 refills | Status: DC

## 2021-05-07 MED ORDER — PANTOPRAZOLE SODIUM 20 MG PO TBEC: 20.0000 mg | DELAYED_RELEASE_TABLET | Freq: Every day | ORAL | 2 refills | Status: DC

## 2021-05-07 MED ORDER — DULOXETINE HCL 60 MG PO CPEP: 60.0000 mg | ORAL_CAPSULE | Freq: Every day | ORAL | 1 refills | Status: DC

## 2021-05-07 NOTE — Telephone Encounter (Signed)
Rx sent to the pharmacy.

## 2021-05-07 NOTE — Telephone Encounter (Signed)
Patient called as she is having issues getting all of her medications transferred to [facility] as she cannot use [facility] anymore with her insurance.  Called [facility] to find out which prescriptions they have on hand for her and will request refills for all others. [facility] confirmed they only had prescriptions for: bupropion 75 mg, desonide, olmesartan 20 mg. Informed them that the bupropion should be 100 mg and to cancel out the 75 mg dose.  Will route to teamcare pool for refills for her remaining medications.   Will call patient back after everything is sent.

## 2021-05-10 MED ORDER — OLMESARTAN MEDOXOMIL 20 MG PO TABS: 20.0000 mg | ORAL_TABLET | Freq: Every day | ORAL | 2 refills | Status: DC

## 2021-05-10 MED ORDER — BUPROPION HCL ER (SR) 100 MG PO TB12: 100.0000 mg | ORAL_TABLET | Freq: Two times a day (BID) | ORAL | 1 refills | Status: DC

## 2021-05-10 MED ORDER — APIXABAN 5 MG PO TABS: 5.0000 mg | ORAL_TABLET | Freq: Two times a day (BID) | ORAL | 3 refills | Status: DC

## 2021-05-10 MED ORDER — EZETIMIBE 10 MG PO TABS: 10.0000 mg | ORAL_TABLET | Freq: Every day | ORAL | 2 refills | Status: DC

## 2021-05-10 MED ORDER — PANTOPRAZOLE SODIUM 20 MG PO TBEC: 20.0000 mg | DELAYED_RELEASE_TABLET | Freq: Every day | ORAL | 2 refills | Status: DC

## 2021-05-10 MED ORDER — DULOXETINE HCL 60 MG PO CPEP: 60.0000 mg | ORAL_CAPSULE | Freq: Every day | ORAL | 1 refills | Status: DC

## 2021-05-10 MED ORDER — ALLOPURINOL 100 MG PO TABS: 100.0000 mg | ORAL_TABLET | Freq: Two times a day (BID) | ORAL | 1 refills | Status: DC

## 2021-05-10 NOTE — Telephone Encounter (Signed)
Rx's sent in to new pharmacy.

## 2021-05-18 ENCOUNTER — Other Ambulatory Visit: Payer: Self-pay

## 2021-05-18 ENCOUNTER — Encounter: Payer: Self-pay | Admitting: Podiatry

## 2021-05-18 ENCOUNTER — Ambulatory Visit (INDEPENDENT_AMBULATORY_CARE_PROVIDER_SITE_OTHER): Payer: Medicare Other | Admitting: Podiatry

## 2021-05-18 DIAGNOSIS — M79675 Pain in left toe(s): Secondary | ICD-10-CM

## 2021-05-18 DIAGNOSIS — L84 Corns and callosities: Secondary | ICD-10-CM

## 2021-05-18 DIAGNOSIS — M79674 Pain in right toe(s): Secondary | ICD-10-CM

## 2021-05-18 DIAGNOSIS — E1142 Type 2 diabetes mellitus with diabetic polyneuropathy: Secondary | ICD-10-CM | POA: Diagnosis not present

## 2021-05-18 DIAGNOSIS — B351 Tinea unguium: Secondary | ICD-10-CM

## 2021-05-20 ENCOUNTER — Other Ambulatory Visit: Payer: Self-pay | Admitting: Family Medicine

## 2021-05-20 NOTE — Telephone Encounter (Signed)
Patient called to let me know that her insurance informed her that she actually has to get all of [facility] of [facility] and they will not transfer any of the medications from [facility]. Routing to PCP's teamcare to send in refills of all of her medications.

## 2021-05-21 ENCOUNTER — Telehealth: Payer: Self-pay | Admitting: Pharmacist

## 2021-05-21 MED ORDER — PANTOPRAZOLE SODIUM 20 MG PO TBEC: 20.0000 mg | DELAYED_RELEASE_TABLET | Freq: Every day | ORAL | 2 refills | Status: DC

## 2021-05-21 MED ORDER — ALLOPURINOL 100 MG PO TABS: 100.0000 mg | ORAL_TABLET | Freq: Two times a day (BID) | ORAL | 1 refills | Status: DC

## 2021-05-21 MED ORDER — DULOXETINE HCL 60 MG PO CPEP: 60.0000 mg | ORAL_CAPSULE | Freq: Every day | ORAL | 1 refills | Status: DC

## 2021-05-21 MED ORDER — OLMESARTAN MEDOXOMIL 20 MG PO TABS: 20.0000 mg | ORAL_TABLET | Freq: Every day | ORAL | 2 refills | Status: DC

## 2021-05-21 MED ORDER — APIXABAN 5 MG PO TABS: 5.0000 mg | ORAL_TABLET | Freq: Two times a day (BID) | ORAL | 3 refills | Status: DC

## 2021-05-21 MED ORDER — EZETIMIBE 10 MG PO TABS: 10.0000 mg | ORAL_TABLET | Freq: Every day | ORAL | 2 refills | Status: DC

## 2021-05-21 MED ORDER — BUPROPION HCL ER (SR) 100 MG PO TB12: 100.0000 mg | ORAL_TABLET | Freq: Two times a day (BID) | ORAL | 1 refills | Status: DC

## 2021-05-21 NOTE — Telephone Encounter (Signed)
Rx's re-sent into mail order.

## 2021-05-21 NOTE — Chronic Care Management (AMB) (Signed)
° ° °  Chronic Care Management Pharmacy Assistant   Name: REETA KUK  MRN: [id number] DOB: Sep 03, 1940  Reason for Encounter: Reschedule missed appointment with [name]. Patient was already scheduled for 05/28/2021 at 2:00.    [place] Pharmacist Assistant [phone]

## 2021-05-21 NOTE — Addendum Note (Signed)
Addended by: Rodrigo Ran on: 05/21/2021 07:06 AM   Modules accepted: Orders

## 2021-05-23 NOTE — Progress Notes (Signed)
Subjective: Alexa Brown is a 81 y.o. female patient seen today for follow up of  at risk foot care with history of diabetic neuropathy and callus(es) right foot and painful thick toenails that are difficult to trim. Painful toenails interfere with ambulation. Aggravating factors include wearing enclosed shoe gear. Pain is relieved with periodic professional debridement. Painful calluses are aggravated when weightbearing with and without shoegear. Pain is relieved with periodic professional debridement..   She is accompanied by her daughter, Maudie Mercury, on today's visit.  New problem(s)/concern(s) today: None    PCP is Martinique, Rhealyn G, MD. Last visit was: 03/31/2021.  No Known Allergies  Objective: Physical Exam  General: Patient is a pleasant 81 y.o. Caucasian female WD, WN in NAD. AAO x 3.   Neurovascular Examination: Capillary refill time to digits immediate b/l. Palpable pedal pulses b/l LE. Pedal hair sparse. No pain with calf compression b/l. Lower extremity skin temperature gradient within normal limits. No edema noted b/l LE. No ischemia or gangrene noted b/l LE. No cyanosis or clubbing noted b/l LE.  Pt has subjective symptoms of neuropathy. Protective sensation intact 5/5 intact bilaterally with 10g monofilament b/l. Vibratory sensation intact b/l.  Dermatological:  Pedal skin is warm and supple b/l LE. No open wounds b/l LE. No interdigital macerations noted b/l LE. Toenails 1-5 b/l elongated, discolored, dystrophic, thickened, crumbly with subungual debris and tenderness to dorsal palpation. Hyperkeratotic lesion(s) R hallux.  No erythema, no edema, no drainage, no fluctuance.  Musculoskeletal:  Muscle strength 5/5 to all lower extremity muscle groups bilaterally. No pain, crepitus or joint limitation noted with ROM bilateral LE. No gross bony deformities bilaterally.  Assessment: 1. Pain due to onychomycosis of toenails of both feet   2. Callus   3. Diabetic peripheral  neuropathy associated with type 2 diabetes mellitus ([place])    Plan: Patient was evaluated and treated and all questions answered. Consent given for treatment as described below: -Mycotic toenails 1-5 bilaterally were debrided in length and girth with sterile nail nippers and dremel without incident. -Callus(es) R hallux pared utilizing sterile scalpel blade without complication or incident. Total number debrided =1. -Patient/POA to call should there be question/concern in the interim.  Return in about 3 months (around 08/15/2021).  Marzetta Board, DPM

## 2021-05-25 NOTE — Progress Notes (Signed)
Chronic Care Management Pharmacy Note  05/26/2021 Name:  Alexa Brown MRN:  [id number] DOB:  [birth year]  Summary: BP is at goal of < 140/90 at home LDL is not at goal < 70  Recommendations/Changes made from today's visit: -Recommend repeat lipid panel and uric acid level -Offered grief counseling from CCM Education officer, museum and pt declined -Recommend referral to psychiatrist  Plan: BP assessment in 3 months Follow up in 6 months  Subjective: Alexa Brown is an 81 y.o. year old female who is a primary patient of Martinique, Malka So, MD.  The CCM team was consulted for assistance with disease management and care coordination needs.    Engaged with patient by telephone for follow up visit in response to provider referral for pharmacy case management and/or care coordination services.   Consent to Services:  The patient was given information about Chronic Care Management services, agreed to services, and gave verbal consent prior to initiation of services.  Please see initial visit note for detailed documentation.   Patient Care Team: Martinique, Ceniya G, MD as PCP - General (Family Medicine) Viona Gilmore, [facility] as Pharmacist (Pharmacist)  Recent office visits: 03/31/21 Lyna Martinique, MD: Patient presented for chronic conditions follow up. Plan for ABI. A1c increased from 5.5 to 6.1%.  Plan for FLP at next visit.  03/11/21 Grier Mitts, MD: Patient presented for video visit for rosacea. Prescribed metronidazole cream twice daily.   02/09/21 Glenna Durand, LPN: Patient presented for AWV.  01/19/21 Brendolyn Martinique, MD: Patient presented for chronic conditions follow up. Recommended kegel and pelvic floor exercises.  Recent consult visits: 05/18/21 Acquanetta Sit, DPM (podiatry): Patient presented for nail debridement.  05/05/21 Servando Snare, MD (vasc surgery): Patient presented for initial visit for arthalgia of lower legs.  02/05/21 Acquanetta Sit, DPM (podiatry):  Patient presented for nail debridement.  09/21/20 Annice Pih, DO ([facility]): Patient presented for eye exam.  05/07/20 Jovita Kussmaul, MD ([facility]): Patient presented for eye procedure.  Hospital visits: Medication Reconciliation was completed by comparing discharge summary, patients EMR and Pharmacy list, and upon discussion with patient.   Admitted to the hospital on 04.29.2022 due to Acute Pulmonary embolism . Discharge date was 05.02.2022. Discharged from [place]?Medications Started at [facility] Discharge:?? -started the following medication due to acute pulmonary embolism Apixaban Starter Pack (45m and 559m (ELIQUIS STARTER PACK)   Medication Changes at Hospital Discharge: -None change   Medications Discontinued at Hospital Discharge: -Stopped following medication due to acute pulmonary embolism b complex vitamins capsule Clopidogrel 75 MG tablet (PLAVIX) L-Lysine 500 MG Caps Pravastatin 20 MG tablet (PRAVACHOL) Vitamin E 180 MG (400 UNITS) capsule   Medications that remain the same after Hospital Discharge:?? -All other medications will remain the same.    Objective:  Lab Results  Component Value Date   CREATININE 1.01 01/19/2021   BUN 17 01/19/2021   GFR 53.29 (L) 01/19/2021   GFRNONAA >60 07/27/2020   GFRAA 63 02/11/2020   NA 137 01/19/2021   K 4.5 01/19/2021   CALCIUM 10.2 01/19/2021   CO2 25 01/19/2021   GLUCOSE 104 (H) 01/19/2021    Lab Results  Component Value Date/Time   HGBA1C 6.1 03/31/2021 10:05 AM   HGBA1C 5.5 11/02/2020 08:56 AM   HGBA1C 6.2 07/14/2020 09:17 AM   HGBA1C 6.0 (H) 02/11/2020 09:05 AM   HGBA1C 5.7 07/27/2015 12:00 AM   HGBA1C 5.9 03/27/2015 12:00 AM   FRUCTOSAMINE 253 12/18/2018 09:23  AM   GFR 53.29 (L) 01/19/2021 03:30 PM   GFR 49.89 (L) 07/30/2020 11:58 AM   MICROALBUR 1.1 07/14/2020 09:17 AM   MICROALBUR 0.8 01/01/2019 10:56 AM    Last diabetic Eye exam: No results found for: HMDIABEYEEXA   Last diabetic Foot exam: No results found for: HMDIABFOOTEX   Lab Results  Component Value Date   CHOL 263 (H) 02/11/2020   HDL 77 02/11/2020   LDLCALC 164 (H) 02/11/2020   TRIG 106 02/11/2020   CHOLHDL 3.4 02/11/2020    Hepatic Function Latest Ref Rng & Units 07/27/2020 07/26/2020 07/25/2020  Total Protein 6.5 - 8.1 g/dL - - 6.4(L)  Albumin 3.5 - 5.0 g/dL 3.4(L) 3.6 3.4(L)  AST 15 - 41 U/L - - 18  ALT 0 - 44 U/L - - 14  Alk Phosphatase 38 - 126 U/L - - 45  Total Bilirubin 0.3 - 1.2 mg/dL - - 0.2(L)    Lab Results  Component Value Date/Time   TSH 2.87 12/18/2018 09:23 AM   TSH 1.41 10/30/2017 12:00 AM    CBC Latest Ref Rng & Units 07/30/2020 07/27/2020 07/26/2020  WBC 4.0 - 10.5 K/uL 5.3 5.2 6.4  Hemoglobin 12.0 - 15.0 g/dL 14.4 13.6 14.0  Hematocrit 36.0 - 46.0 % 42.8 41.1 42.0  Platelets 150.0 - 400.0 K/uL 253.0 223 219    Lab Results  Component Value Date/Time   VD25OH 67 07/21/2017 04:02 PM   VD25OH 43.34 01/23/2017 01:08 PM   VD25OH 54.05 01/14/2016 03:49 PM    Clinical ASCVD: Yes  The ASCVD Risk score (Arnett DK, et al., 2019) failed to calculate for the following reasons:   The patient has a prior MI or stroke diagnosis    Depression screen [facility] 2/9 03/31/2021 02/09/2021 08/31/2020  Decreased Interest '2 3 3  ' Down, Depressed, Hopeless 2 0 2  PHQ - 2 Score '4 3 5  ' Altered sleeping '2 3 3  ' Tired, decreased energy 2 0 3  Change in appetite 2 0 2  Feeling bad or failure about yourself  '2 2 3  ' Trouble concentrating 1 0 1  Moving slowly or fidgety/restless 1 0 0  Suicidal thoughts 1 0 0  PHQ-9 Score '15 8 17  ' Difficult doing work/chores Somewhat difficult Not difficult at all Somewhat difficult      Social History   Tobacco Use  Smoking Status Never  Smokeless Tobacco Never   BP Readings from Last 3 Encounters:  05/05/21 124/67  03/31/21 124/70  01/19/21 126/70   Pulse Readings from Last 3 Encounters:  05/05/21 88  03/31/21 96  01/19/21 96   Wt Readings from  Last 3 Encounters:  05/05/21 163 lb (73.9 kg)  03/31/21 163 lb 4 oz (74 kg)  02/09/21 161 lb (73 kg)   BMI Readings from Last 3 Encounters:  05/05/21 26.31 kg/m  03/31/21 26.35 kg/m  02/09/21 25.99 kg/m    Assessment/Interventions: Review of patient past medical history, allergies, medications, health status, including review of consultants reports, laboratory and other test data, was performed as part of comprehensive evaluation and provision of chronic care management services.   SDOH:  (Social Determinants of Health) assessments and interventions performed: No  SDOH Screenings   Alcohol Screen: Not on file  Depression (PHQ2-9): Medium Risk   PHQ-2 Score: 15  Financial Resource Strain: Low Risk    Difficulty of Paying Living Expenses: Not hard at all  Food Insecurity: No Food Insecurity   Worried About Charity fundraiser in  the Last Year: Never true   Ran Out of Food in the Last Year: Never true  Housing: Not on file  Physical Activity: Inactive   Days of Exercise per Week: 0 days   Minutes of Exercise per Session: 0 min  Social Connections: Not on file  Stress: No Stress Concern Present   Feeling of Stress : Not at all  Tobacco Use: Low Risk    Smoking Tobacco Use: Never   Smokeless Tobacco Use: Never   Passive Exposure: Not on file  Transportation Needs: No Transportation Needs   Lack of Transportation (Medical): No   Lack of Transportation (Non-Medical): No    CCM Care Plan  No Known Allergies  Medications Reviewed Today     Reviewed by Viona Gilmore, [facility] (Pharmacist) on 05/26/21 at No Name List Status: <None>   Medication Order Taking? Sig Documenting Provider Last Dose Status Informant  ACCU-CHEK AVIVA PLUS test strip [id number] No USE TO TEST BLOOD SUGAR ONCE DAILY Martinique, Yue G, MD Taking Active Child  Accu-Chek Softclix Lancets lancets [id number] No Use to check blood sugar once daily Martinique, Jewelz G, MD Taking Active Child  acetaminophen  (TYLENOL) 325 MG tablet [id number] No Take 650 mg by mouth every 6 (six) hours as needed for mild pain, fever or headache. [provider] Taking Active Child  allopurinol (ZYLOPRIM) 100 MG tablet [id number]  Take 1 tablet (100 mg total) by mouth 2 (two) times daily. Martinique, Sabina G, MD  Active   Alpha-Lipoic Acid 600 MG TABS [id number] No Take 600 mg by mouth daily. [provider] Taking Active Child  apixaban (ELIQUIS) 5 MG TABS tablet [id number]  Take 1 tablet (5 mg total) by mouth 2 (two) times daily. Martinique, Lynisha G, MD  Active   buPROPion ER [facility] SR) 100 MG 12 hr tablet [id number]  Take 1 tablet (100 mg total) by mouth 2 (two) times daily. Martinique, Scottlyn G, MD  Active   colchicine 0.6 MG tablet [id number] No TAKE ONE TABLET BY MOUTH TWICE A DAY  Patient not taking: Reported on 05/05/2021   Martinique, Kaytelyn G, MD Not Taking Active   DULoxetine (CYMBALTA) 60 MG capsule [id number]  Take 1 capsule (60 mg total) by mouth daily. Martinique, Jisele G, MD  Active   ezetimibe (ZETIA) 10 MG tablet [id number]  Take 1 tablet (10 mg total) by mouth daily. Martinique, Juley G, MD  Active   Homeopathic Products ([place] EX) [id number] No Apply 1 application topically daily as needed (foot cramps). [provider] Taking Active Child  ketoconazole (NIZORAL) 2 % cream [id number] No SMARTSIG:1 Application Topical 1 to 2 Times Daily [provider] Taking Active   Lancet Devices [facility]) lancets [id number] No Use to test blood sugar once a day. Martinique, Kalyse G, MD Taking Active Child  metroNIDAZOLE (METROCREAM) 0.75 % cream [id number] No Apply topically 2 (two) times daily. Apply a thin layer twice a day to a clean dry face. Billie Ruddy, MD Taking Active   Multiple Vitamin (MULTIVITAMIN) capsule [id number] No Take 1 capsule by mouth daily.  [provider] Taking Active Child  olmesartan (BENICAR) 20 MG tablet [id number]  Take 1 tablet (20 mg total) by mouth daily.  Martinique, Venia G, MD  Active   pantoprazole (PROTONIX) 20 MG tablet [id number]  Take 1 tablet (20 mg total) by mouth daily. Martinique, Morris G, MD  Active             Patient Active  Problem List   Diagnosis Date Noted   Atherosclerosis of aorta ([place]) 01/19/2021   (HFpEF) heart failure with preserved ejection fraction ([place]) 07/30/2020   Acute pulmonary embolism ([place]) 07/24/2020   Diplopia 04/09/2020   Epiretinal membrane (ERM) of left eye 04/09/2020   Pseudophakia of both eyes 04/09/2020   Myalgia due to statin 02/11/2020   Posterior capsular opacification of both eyes, obscuring vision 10/17/2019   Elbow dislocation 07/02/2019   Generalized osteoarthritis of multiple sites 06/26/2018   Incontinent of urine 04/28/2018   Gout, arthropathy 01/22/2018   Lower back pain 09/22/2017   Unstable gait 09/22/2017   Seborrheic dermatitis 01/23/2017   Seborrheic keratosis 09/27/2016   Falling asleep and waking up too early in the evening 08/19/2016   CKD (chronic kidney disease), stage III ([place]) 05/28/2016   GERD (gastroesophageal reflux disease) 02/25/2016   CVA (cerebral vascular accident) (HCC)-No residual deficit 02/12/2016   Glaucoma 02/12/2016   Depression, major, recurrent ([place]) 02/12/2016   Hyperlipidemia associated with type 2 diabetes mellitus ([place]) 02/12/2016   Vitamin D deficiency 01/14/2016   DM (diabetes mellitus), type 2 with neurological complications ([place]) 11/94/1740   Systolic hypertension with cerebrovascular disease 01/14/2016   Peripheral neuropathy 01/14/2016   Acne rosacea 01/28/2013   Thyroid activity decreased 03/28/2009    Immunization History  Administered Date(s) Administered   Hepatitis B, adult 01/22/2018   Influenza Whole 02/01/2016   Influenza, High Dose Seasonal PF 01/14/2016, 11/17/2018   Influenza-Unspecified 01/12/2017, 12/26/2017, 12/13/2019   PFIZER(Purple Top)SARS-COV-2 Vaccination 05/03/2019, 05/28/2019, 01/11/2020   Tdap 12/24/2015   Zoster  Recombinat (Shingrix) 08/16/2016, 01/12/2017   Patient went to bed at 9:30pm last night and woke up at 10:30am. She denies any issues with sleeping right now and is not taking melatonin anymore. She is taking a nap almost daily and sometimes it turns into 2-3 hours.  Conditions to be addressed/monitored:  Hypertension, Hyperlipidemia, Diabetes, GERD, Chronic Kidney Disease, Depression, Osteoarthritis and Gout  Conditions addressed this visit: Hypertension, hyperlipidemia, depression  Care Plan : CCM Pharmacy Care Plan  Updates made by Viona Gilmore, [place] since 05/26/2021 12:00 AM     Problem: Problem: Hypertension, Hyperlipidemia, Diabetes, GERD, Chronic Kidney Disease, Depression, Osteoarthritis and Gout      Long-Range Goal: Patient-Specific Goal   Start Date: 07/08/2020  Expected End Date: 07/08/2021  Recent Progress: On track  Priority: High  Note:   Current Barriers:  Unable to independently monitor therapeutic efficacy Unable to achieve control of cholesterol  Unable to maintain control of blood pressure  Pharmacist Clinical Goal(s):  Patient will achieve adherence to monitoring guidelines and medication adherence to achieve therapeutic efficacy achieve control of cholesterol as evidenced by next lipid panel maintain control of blood pressure as evidenced by home blood pressure readings  through collaboration with PharmD and provider.   Interventions: 1:1 collaboration with Martinique, Debera G, MD regarding development and update of comprehensive plan of care as evidenced by provider attestation and co-signature Inter-disciplinary care team collaboration (see longitudinal plan of care) Comprehensive medication review performed; medication list updated in electronic medical record  Hypertension (BP goal <140/90) -Controlled -Current treatment: Olmesartan 20 mg 1 tablet daily - Appropriate, Effective, Safe, Accessible -Medications previously tried: amlodipine (hypotension),  benazepril (cough) -Current home readings: 120s/60s; highest 140 (once daily) -Current dietary habits: mediterranean diet -Current exercise habits: very little exercise lately and has to be in the mood to do  -Denies hypotensive/hypertensive symptoms -Educated on Importance of home blood pressure monitoring; Proper BP monitoring technique; Symptoms  of hypotension and importance of maintaining adequate hydration; -Counseled to monitor BP at home a few times weekly, document, and provide log at future appointments -Counseled on diet and exercise extensively Recommended to continue current medication  Hyperlipidemia/CAD: (LDL goal < 70) -Uncontrolled -Current treatment: Clopidogrel 75 mg 1 tablet daily - Appropriate, Effective, Safe, Accessible Zetia 10 mg 1 tablet daily - Appropriate, Query effective, Safe, Accessible -Medications previously tried: Atorvastatin, pravastatin (foot cramping, fear of side effects) -Current dietary patterns: restarted mediterranean diet; eating lots of vegetables and some fruit; no fried foods and cooking with olive oil -Current exercise habits: minimal right now -Educated on Cholesterol goals;  Importance of limiting foods high in cholesterol; -Counseled on diet and exercise extensively Recommended to continue current medication Recommended repeat lipid panel at next office visit.  Diabetes (A1c goal <7%) -Controlled -Current medications: No medications -Medications previously tried: Invokana  -Current home glucose readings fasting glucose: did not discuss post prandial glucose: none -Denies hypoglycemic/hyperglycemic symptoms -Current meal patterns:  breakfast: did not discuss  lunch: did not discuss   dinner: did not discuss  snacks: did not discuss  drinks: did not discuss  -Current exercise: PT exercises -Educated on A1c and blood sugar goals; Exercise goal of 150 minutes per week; Benefits of routine self-monitoring of blood  sugar; -Counseled to check feet daily and get yearly eye exams -Counseled on diet and exercise extensively  Depression/Anxiety (Goal: minimize symptoms) -Controlled -Current treatment: Wellbutrin 100 mg twice daily - Appropriate, Query effective, Safe, Accessible Cymbalta 60 mg daily - Appropriate, Query effective, Safe, Accessible -Medications previously tried/failed: none -PHQ9: 0 -GAD7: n/a -Educated on Benefits of medication for symptom control Benefits of cognitive-behavioral therapy with or without medication -Recommended to continue current medication Patient wants to follow up with psychiatrist and not therapist as this did not help in the past.  Insomnia (Goal: improve quality and quantity of sleep) -Controlled -Current treatment  None -Medications previously tried: melatonin -Recommended to continue current medication  Gout (Goal: uric acid < 6 and prevent flare ups) -Controlled -Current treatment  Allopurinol 100 mg twice daily - Appropriate, Effective, Safe, Accessible Colchicine twice daily as needed - Appropriate, Effective, Safe, Accessible -Medications previously tried: none  -Recommended repeat uric acid level Educated on differences between colchicine and allopurinol and colchicine should be used as needed  Bone health (Goal prevent fractures) -Controlled -Last DEXA Scan: 11/17/16              T-Score femoral neck: -0.5             T-Score total hip: n/a             T-Score lumbar spine: +0.0             T-Score forearm radius: n/a             10-year probability of major osteoporotic fracture: n/a             10-year probability of hip fracture: n/a -Patient is not a candidate for pharmacologic treatment -Current treatment  Calcium Carbonate 600 mg QHS + 800 units of D - Appropriate, Effective, Safe, Accessible -Medications previously tried: none  -Recommend [phone] units of vitamin D daily. Recommend 1200 mg of calcium daily from dietary and  supplemental sources. Recommend weight-bearing and muscle strengthening exercises for building and maintaining bone density. -Recommended to continue current medication  GERD (Goal: minimize symptoms) -Controlled -Current treatment  Pantoprazole 20 mg daily - Appropriate, Effective, Safe, Accessible -Medications previously tried: none  -  Recommended to continue current medication  PE (Goal: prevent blood clot formation) -Controlled -Current treatment  Eliquis 5 mg 1 tablet twice daily - Appropriate, Effective, Safe, Accessible -Medications previously tried: none  -Recommended to continue current medication   Allergic rhinitis (Goal: minimize symptoms) -Controlled -Current treatment  Mucinex 600 mg 1 tablet daily - Appropriate, Effective, Safe, Accessible -Medications previously tried: none  -Recommended to continue current medication   Health Maintenance -Vaccine gaps: none -Current therapy:  Voltaren 1% gel  L-Lysine 1000 mg daily (Cold sores)  Magnesium Citrate 100 mg  Miralax 17 g daily PRN Triamcinolone cream Vitamin E 400 units daily  NeuRemedy (Fenfotiamine 300 mg 1 tablet twice daily)  Equate Womens 50+ multivitamin daily Vitamin C 250 mg 2 chewables daily -Educated on Cost vs benefit of each product must be carefully weighed by individual consumer -Patient is satisfied with current therapy and denies issues -Recommended to continue current medication Recommended for patient to stop additional vitamin C, vitamin D and fish oil due to lack of benefit  Patient Goals/Self-Care Activities Patient will:  - take medications as prescribed check glucose daily, document, and provide at future appointments check blood pressure a few times weekly, document, and provide at future appointments target a minimum of 150 minutes of moderate intensity exercise weekly engage in dietary modifications by increasing fiber intake  Follow Up Plan: Telephone follow up appointment with  care management team member scheduled for: 6 months       Medication Assistance: None required.  Patient affirms current coverage meets needs.  Compliance/Adherence/Medication fill history: Care Gaps: COVID booster  [facility]: Olmesartan 20 mg - last filled 08/31/20 for 90 ds at [facility]  Patient's preferred pharmacy is:  [facility], [place] [address] Phone: [phone] Fax: [phone]   Uses pill box? Yes - daughter fills for her Pt endorses 100% compliance  We discussed: Current pharmacy is preferred with insurance plan and patient is satisfied with pharmacy services Patient decided to: Continue current medication management strategy  Care Plan and Follow Up Patient Decision:  Patient agrees to Care Plan and Follow-up.  Plan: Telephone follow up appointment with care management team member scheduled for:  6 months  [name], PharmD [place] Pharmacist [place] [facility] [place] [phone]

## 2021-05-25 NOTE — Chronic Care Management (AMB) (Signed)
Rescheduled appointment with Alexa Brown on 05/28/2021. Rescheduled with patient to 05/26/2021.

## 2021-05-26 ENCOUNTER — Ambulatory Visit (INDEPENDENT_AMBULATORY_CARE_PROVIDER_SITE_OTHER): Payer: Medicare Other | Admitting: Pharmacist

## 2021-05-26 DIAGNOSIS — E1169 Type 2 diabetes mellitus with other specified complication: Secondary | ICD-10-CM

## 2021-05-26 DIAGNOSIS — I674 Hypertensive encephalopathy: Secondary | ICD-10-CM

## 2021-05-26 DIAGNOSIS — E785 Hyperlipidemia, unspecified: Secondary | ICD-10-CM

## 2021-05-26 NOTE — Patient Instructions (Signed)
Hi Jenet, ? ?It was great to catch up again! Keep up the good work with taking care of yourself. ? ?Please reach out to me if you have any questions or need anything before our follow up! ? ?Best, ?Maddie ? ?[name], PharmD, BCACP ?Clinical Pharmacist ?Therapist, [facility] at [facility] ?[phone] ? ? Visit Information ? ? Goals Addressed   ?None ?  ? ?Patient Care Plan: Jenner  ?  ? ?Problem Identified: Problem: Hypertension, Hyperlipidemia, Diabetes, GERD, Chronic Kidney Disease, Depression, Osteoarthritis and Gout   ?  ? ?Long-Range Goal: Patient-Specific Goal   ?Start Date: 07/08/2020  ?Expected End Date: 07/08/2021  ?Recent Progress: On track  ?Priority: High  ?Note:   ?Current Barriers:  ?Unable to independently monitor therapeutic efficacy ?Unable to achieve control of cholesterol  ?Unable to maintain control of blood pressure ? ?Pharmacist Clinical Goal(s):  ?Patient will achieve adherence to monitoring guidelines and medication adherence to achieve therapeutic efficacy ?achieve control of cholesterol as evidenced by next lipid panel ?maintain control of blood pressure as evidenced by home blood pressure readings  through collaboration with PharmD and provider.  ? ?Interventions: ?1:1 collaboration with Martinique, Cortlynn G, MD regarding development and update of comprehensive plan of care as evidenced by provider attestation and co-signature ?Inter-disciplinary care team collaboration (see longitudinal plan of care) ?Comprehensive medication review performed; medication list updated in electronic medical record ? ?Hypertension (BP goal <140/90) ?-Controlled ?-Current treatment: ?Olmesartan 20 mg 1 tablet daily - Appropriate, Effective, Safe, Accessible ?-Medications previously tried: amlodipine (hypotension), benazepril (cough) ?-Current home readings: 120s/60s; highest 140 (once daily) ?-Current dietary habits: mediterranean diet ?-Current exercise habits: very little exercise lately and  has to be in the mood to do  ?-Denies hypotensive/hypertensive symptoms ?-Educated on Importance of home blood pressure monitoring; ?Proper BP monitoring technique; ?Symptoms of hypotension and importance of maintaining adequate hydration; ?-Counseled to monitor BP at home a few times weekly, document, and provide log at future appointments ?-Counseled on diet and exercise extensively ?Recommended to continue current medication ? ?Hyperlipidemia/CAD: (LDL goal < 70) ?-Uncontrolled ?-Current treatment: ?Clopidogrel 75 mg 1 tablet daily - Appropriate, Effective, Safe, Accessible ?Zetia 10 mg 1 tablet daily - Appropriate, Query effective, Safe, Accessible ?-Medications previously tried: Atorvastatin, pravastatin (foot cramping, fear of side effects) ?-Current dietary patterns: restarted mediterranean diet; eating lots of vegetables and some fruit; no fried foods and cooking with olive oil ?-Current exercise habits: minimal right now ?-Educated on Cholesterol goals;  ?Importance of limiting foods high in cholesterol; ?-Counseled on diet and exercise extensively ?Recommended to continue current medication ?Recommended repeat lipid panel at next office visit. ? ?Diabetes (A1c goal <7%) ?-Controlled ?-Current medications: ?No medications ?-Medications previously tried: Invokana  ?-Current home glucose readings ?fasting glucose: did not discuss ?post prandial glucose: none ?-Denies hypoglycemic/hyperglycemic symptoms ?-Current meal patterns:  ?breakfast: did not discuss  ?lunch: did not discuss   ?dinner: did not discuss  ?snacks: did not discuss  ?drinks: did not discuss  ?-Current exercise: PT exercises ?-Educated on A1c and blood sugar goals; ?Exercise goal of 150 minutes per week; ?Benefits of routine self-monitoring of blood sugar; ?-Counseled to check feet daily and get yearly eye exams ?-Counseled on diet and exercise extensively ? ?Depression/Anxiety (Goal: minimize symptoms) ?-Controlled ?-Current  treatment: ?Wellbutrin 100 mg twice daily - Appropriate, Query effective, Safe, Accessible ?Cymbalta 60 mg daily - Appropriate, Query effective, Safe, Accessible ?-Medications previously tried/failed: none ?-PHQ9: 0 ?-GAD7: n/a ?-Educated on Benefits of medication for symptom control ?Benefits of  cognitive-behavioral therapy with or without medication ?-Recommended to continue current medication ?Patient wants to follow up with psychiatrist and not therapist as this did not help in the past. ? ?Insomnia (Goal: improve quality and quantity of sleep) ?-Controlled ?-Current treatment  ?None ?-Medications previously tried: melatonin ?-Recommended to continue current medication ? ?Gout (Goal: uric acid < 6 and prevent flare ups) ?-Controlled ?-Current treatment  ?Allopurinol 100 mg twice daily - Appropriate, Effective, Safe, Accessible ?Colchicine twice daily as needed - Appropriate, Effective, Safe, Accessible ?-Medications previously tried: none  ?-Recommended repeat uric acid level ?Educated on differences between colchicine and allopurinol and colchicine should be used as needed ? ?Bone health (Goal prevent fractures) ?-Controlled ?-Last DEXA Scan: 11/17/16  ?            T-Score femoral neck: -0.5 ?            T-Score total hip: n/a ?            T-Score lumbar spine: +0.0 ?            T-Score forearm radius: n/a ?            10-year probability of major osteoporotic fracture: n/a ?            10-year probability of hip fracture: n/a ?-Patient is not a candidate for pharmacologic treatment ?-Current treatment  ?Calcium Carbonate 600 mg QHS + 800 units of D - Appropriate, Effective, Safe, Accessible ?-Medications previously tried: none  ?-Recommend [phone] units of vitamin D daily. Recommend 1200 mg of calcium daily from dietary and supplemental sources. Recommend weight-bearing and muscle strengthening exercises for building and maintaining bone density. ?-Recommended to continue current medication ? ?GERD (Goal:  minimize symptoms) ?-Controlled ?-Current treatment  ?Pantoprazole 20 mg daily - Appropriate, Effective, Safe, Accessible ?-Medications previously tried: none  ?-Recommended to continue current medication ? ?PE (Goal: prevent blood clot formation) ?-Controlled ?-Current treatment  ?Eliquis 5 mg 1 tablet twice daily - Appropriate, Effective, Safe, Accessible ?-Medications previously tried: none  ?-Recommended to continue current medication ? ? ?Allergic rhinitis (Goal: minimize symptoms) ?-Controlled ?-Current treatment  ?Mucinex 600 mg 1 tablet daily - Appropriate, Effective, Safe, Accessible ?-Medications previously tried: none  ?-Recommended to continue current medication ? ? ?Health Maintenance ?-Vaccine gaps: none ?-Current therapy:  ?Voltaren 1% gel  ?L-Lysine 1000 mg daily (Cold sores)  ?Magnesium Citrate 100 mg  ?Miralax 17 g daily PRN ?Triamcinolone cream ?Vitamin E 400 units daily  ?NeuRemedy (Fenfotiamine 300 mg 1 tablet twice daily)  ?Equate Womens 50+ multivitamin daily ?Vitamin C 250 mg 2 chewables daily ?-Educated on Cost vs benefit of each product must be carefully weighed by individual consumer ?-Patient is satisfied with current therapy and denies issues ?-Recommended to continue current medication ?Recommended for patient to stop additional vitamin C, vitamin D and fish oil due to lack of benefit ? ?Patient Goals/Self-Care Activities ?Patient will:  ?- take medications as prescribed ?check glucose daily, document, and provide at future appointments ?check blood pressure a few times weekly, document, and provide at future appointments ?target a minimum of 150 minutes of moderate intensity exercise weekly ?engage in dietary modifications by increasing fiber intake ? ?Follow Up Plan: Telephone follow up appointment with care management team member scheduled for: 6 months ? ?  ?  ? ?Patient verbalizes understanding of instructions and care plan provided today and agrees to view in [place]. Active  MyChart status confirmed with patient.   ?Telephone follow up appointment with pharmacy team member  scheduled for: 6 months ? ?Viona Gilmore, RPH  ?

## 2021-05-27 ENCOUNTER — Telehealth: Payer: Self-pay | Admitting: *Deleted

## 2021-05-27 DIAGNOSIS — F331 Major depressive disorder, recurrent, moderate: Secondary | ICD-10-CM

## 2021-05-27 DIAGNOSIS — G629 Polyneuropathy, unspecified: Secondary | ICD-10-CM

## 2021-05-27 MED ORDER — DULOXETINE HCL 60 MG PO CPEP: 60.0000 mg | ORAL_CAPSULE | Freq: Every day | ORAL | 1 refills | Status: DC

## 2021-05-27 NOTE — Telephone Encounter (Signed)
-----   Message from Viona Gilmore, [facility] sent at 05/27/2021  1:55 PM EST ----- ?Regarding: Duloxetine refill ?Hi, ? ?Can you [facility] in a [facility] [address] Ms. Camillo Flaming is completely out of the medication and is having issues figuring out what pharmacy is in network vs not. ? ?Thanks! ?Maddie ? ?

## 2021-05-28 ENCOUNTER — Other Ambulatory Visit: Payer: Self-pay

## 2021-05-28 ENCOUNTER — Emergency Department (HOSPITAL_BASED_OUTPATIENT_CLINIC_OR_DEPARTMENT_OTHER): Payer: Medicare Other

## 2021-05-28 ENCOUNTER — Encounter (HOSPITAL_BASED_OUTPATIENT_CLINIC_OR_DEPARTMENT_OTHER): Payer: Self-pay

## 2021-05-28 ENCOUNTER — Emergency Department (HOSPITAL_BASED_OUTPATIENT_CLINIC_OR_DEPARTMENT_OTHER)
Admission: EM | Admit: 2021-05-28 | Discharge: 2021-05-28 | Disposition: A | Discharge status: Home / Self Care | Payer: Medicare Other | Attending: Emergency Medicine | Admitting: Emergency Medicine

## 2021-05-28 ENCOUNTER — Telehealth: Payer: Medicare Other

## 2021-05-28 DIAGNOSIS — Z7901 Long term (current) use of anticoagulants: Secondary | ICD-10-CM | POA: Diagnosis not present

## 2021-05-28 DIAGNOSIS — W01198A Fall on same level from slipping, tripping and stumbling with subsequent striking against other object, initial encounter: Secondary | ICD-10-CM | POA: Diagnosis not present

## 2021-05-28 DIAGNOSIS — W19XXXA Unspecified fall, initial encounter: Secondary | ICD-10-CM

## 2021-05-28 DIAGNOSIS — S0990XA Unspecified injury of head, initial encounter: Secondary | ICD-10-CM | POA: Diagnosis not present

## 2021-05-28 DIAGNOSIS — Y9301 Activity, walking, marching and hiking: Secondary | ICD-10-CM | POA: Diagnosis not present

## 2021-05-28 DIAGNOSIS — I10 Essential (primary) hypertension: Secondary | ICD-10-CM | POA: Diagnosis not present

## 2021-05-28 DIAGNOSIS — Z79899 Other long term (current) drug therapy: Secondary | ICD-10-CM | POA: Insufficient documentation

## 2021-05-28 DIAGNOSIS — S060X0A Concussion without loss of consciousness, initial encounter: Secondary | ICD-10-CM | POA: Diagnosis not present

## 2021-05-28 DIAGNOSIS — S0003XA Contusion of scalp, initial encounter: Secondary | ICD-10-CM | POA: Diagnosis not present

## 2021-05-28 NOTE — ED Provider Notes (Signed)
?[place] EMERGENCY DEPT ?Provider Note ? ? ?CSN: [id number] ?Arrival date & time: 05/28/21  0004 ? ?  ? ?History ? ?Chief Complaint  ?Patient presents with  ? Fall  ? ? ?Alexa Brown is a 81 y.o. female. ? ?Lost balance and fell. Hit head. Was dazed afterwards iwht impaired memory, has since improved. No nause, vomiting or pain elsewhere. Has ambulated after the fall. ? ? ?Fall ?This is a recurrent problem. The current episode started 1 to 2 hours ago. The problem occurs constantly. The problem has not changed since onset.Pertinent negatives include no chest pain and no abdominal pain. Nothing aggravates the symptoms. Nothing relieves the symptoms. She has tried nothing for the symptoms.  ? ?  ? ?Home Medications ?Prior to Admission medications   ?Medication Sig Start Date End Date Taking? Authorizing Provider  ?ACCU-CHEK AVIVA PLUS test strip USE TO TEST BLOOD SUGAR ONCE DAILY 09/09/19   Martinique, Tiffinie G, MD  ?Accu-Chek Softclix Lancets lancets Use to check blood sugar once daily 09/10/19   Martinique, Darleth G, MD  ?acetaminophen (TYLENOL) 325 MG tablet Take 650 mg by mouth every 6 (six) hours as needed for mild pain, fever or headache.    [provider]  ?allopurinol (ZYLOPRIM) 100 MG tablet Take 1 tablet (100 mg total) by mouth 2 (two) times daily. 05/21/21   Martinique, Antonetta G, MD  ?Alpha-Lipoic Acid 600 MG TABS Take 600 mg by mouth daily.    [provider]  ?apixaban (ELIQUIS) 5 MG TABS tablet Take 1 tablet (5 mg total) by mouth 2 (two) times daily. 05/21/21   Martinique, Demiya G, MD  ?buPROPion ER [facility] SR) 100 MG 12 hr tablet Take 1 tablet (100 mg total) by mouth 2 (two) times daily. 05/21/21   Martinique, Evangelene G, MD  ?DULoxetine (CYMBALTA) 60 MG capsule Take 1 capsule (60 mg total) by mouth daily. 05/27/21   Martinique, Fatuma G, MD  ?ezetimibe (ZETIA) 10 MG tablet Take 1 tablet (10 mg total) by mouth daily. 05/21/21   Martinique, Nari G, MD  ?Homeopathic Products [facility] RELIEF EX)  Apply 1 application topically daily as needed (foot cramps).    [provider]  ?ketoconazole (NIZORAL) 2 % cream SMARTSIG:1 Application Topical 1 to 2 Times Daily 11/06/20   [provider]  ?Lancet Devices [facility]) lancets Use to test blood sugar once a day. 03/31/16   Martinique, Kanyon G, MD  ?metroNIDAZOLE (METROCREAM) 0.75 % cream Apply topically 2 (two) times daily. Apply a thin layer twice a day to a clean dry face. 03/11/21   Billie Ruddy, MD  ?Multiple Vitamin (MULTIVITAMIN) capsule Take 1 capsule by mouth daily.     [provider]  ?olmesartan (BENICAR) 20 MG tablet Take 1 tablet (20 mg total) by mouth daily. 05/21/21   Martinique, Bianca G, MD  ?pantoprazole (PROTONIX) 20 MG tablet Take 1 tablet (20 mg total) by mouth daily. 05/21/21   Martinique, Geniyah G, MD  ?   ? ?Allergies    ?Patient has no known allergies.   ? ?Review of Systems   ?Review of Systems  ?Cardiovascular:  Negative for chest pain.  ?Gastrointestinal:  Negative for abdominal pain.  ? ?Physical Exam ?Updated Vital Signs ?BP (!) 118/50   Pulse 88   Temp 98.2 ?F (36.8 ?C)   Resp 17   Ht 5\' 7"  (1.702 m)   Wt 73.9 kg   SpO2 94%   BMI 25.53 kg/m?  ?Physical Exam ?Vitals and  nursing note reviewed.  ?Constitutional:   ?   Appearance: She is well-developed.  ?HENT:  ?   Head: Normocephalic.  ?   Comments: Large hematoma to right posterior scalp without laceration/abrasions ?   Nose: Nose normal. No congestion or rhinorrhea.  ?   Mouth/Throat:  ?   Mouth: Mucous membranes are moist.  ?   Pharynx: Oropharynx is clear.  ?Eyes:  ?   Pupils: Pupils are equal, round, and reactive to light.  ?Cardiovascular:  ?   Rate and Rhythm: Normal rate and regular rhythm.  ?Pulmonary:  ?   Effort: No respiratory distress.  ?   Breath sounds: No stridor.  ?Abdominal:  ?   General: Abdomen is flat. There is no distension.  ?Musculoskeletal:     ?   General: Tenderness (with associated ecchymosis to right patella) present. No swelling  or deformity. Normal range of motion.  ?   Cervical back: Normal range of motion.  ?Skin: ?   General: Skin is warm and dry.  ?Neurological:  ?   General: No focal deficit present.  ?   Mental Status: She is alert. Mental status is at baseline.  ?   Cranial Nerves: No cranial nerve deficit.  ?   Sensory: No sensory deficit.  ?   Motor: No weakness.  ?   Coordination: Coordination normal.  ?   Gait: Gait normal.  ? ? ?ED Results / Procedures / Treatments   ?Labs ?(all labs ordered are listed, but only abnormal results are displayed) ?Labs Reviewed - No data to display ? ?EKG ?None ? ?Radiology ?CT Head Wo Contrast ? ?Result Date: 05/28/2021 ?CLINICAL DATA:  Fall, head trauma EXAM: CT HEAD WITHOUT CONTRAST TECHNIQUE: Contiguous axial images were obtained from the base of the skull through the vertex without intravenous contrast. RADIATION DOSE REDUCTION: This exam was performed according to the departmental dose-optimization program which includes automated exposure control, adjustment of the mA and/or kV according to patient size and/or use of iterative reconstruction technique. COMPARISON:  01/30/2018 FINDINGS: Brain: There is atrophy and chronic small vessel disease changes. No acute intracranial abnormality. Specifically, no hemorrhage, hydrocephalus, mass lesion, acute infarction, or significant intracranial injury. Vascular: No hyperdense vessel or unexpected calcification. Skull: No acute calvarial abnormality. Sinuses/Orbits: No acute findings Other: None IMPRESSION: Atrophy, chronic microvascular disease. No acute intracranial abnormality. Electronically Signed   By: Rolm Baptise M.D.   On: 05/28/2021 01:17   ? ?Procedures ?Procedures  ? ? ?Medications Ordered in ED ?Medications - No data to display ? ?ED Course/ Medical Decision Making/ A&P ?  ?                        ?Medical Decision Making ?Amount and/or Complexity of Data Reviewed ?Radiology: ordered. ? ? ?Patietn with fall (secondary to balance issues) on  eliquis, head ct negative. Ambulates. Neuro baseline. Likely  mild concussive symptoms. Stable for d/c. Discussed possibilities of delayed bleeding.  ? ?Final Clinical Impression(s) / ED Diagnoses ?Final diagnoses:  ?Fall, initial encounter  ? ? ?Rx / DC Orders ?ED Discharge Orders   ? ? None  ? ?  ? ? ?  ?Merrily Pew, MD ?05/28/21 6283 ? ?

## 2021-05-28 NOTE — ED Triage Notes (Signed)
Stumbled forward while walking and struck her head on concrete floor. Patient cannot remember the fall, but daughter states she did not loose consciousness. ?

## 2021-06-01 ENCOUNTER — Telehealth: Payer: Self-pay

## 2021-06-01 DIAGNOSIS — K219 Gastro-esophageal reflux disease without esophagitis: Secondary | ICD-10-CM

## 2021-06-01 MED ORDER — ALLOPURINOL 100 MG PO TABS
100.0000 mg | ORAL_TABLET | Freq: Two times a day (BID) | ORAL | 1 refills | Status: DC
Start: 2021-06-01 — End: 2021-09-01

## 2021-06-01 MED ORDER — BUPROPION HCL ER (SR) 100 MG PO TB12: 100.0000 mg | ORAL_TABLET | Freq: Two times a day (BID) | ORAL | 1 refills | Status: DC

## 2021-06-01 MED ORDER — PANTOPRAZOLE SODIUM 20 MG PO TBEC: 20.0000 mg | DELAYED_RELEASE_TABLET | Freq: Every day | ORAL | 2 refills | Status: DC

## 2021-06-01 MED ORDER — EZETIMIBE 10 MG PO TABS: 10.0000 mg | ORAL_TABLET | Freq: Every day | ORAL | 2 refills | Status: DC

## 2021-06-01 MED ORDER — APIXABAN 5 MG PO TABS
5.0000 mg | ORAL_TABLET | Freq: Two times a day (BID) | ORAL | 3 refills | Status: DC
Start: 2021-06-01 — End: 2022-07-13

## 2021-06-01 NOTE — Telephone Encounter (Signed)
-----   Message from Viona Gilmore, [facility] sent at 06/01/2021 12:16 PM EST ----- ?Regarding: Refills ?Hi, ? ?I am hoping this is the last time but can [facility] [address]? ?-pantoprazole ?-ezetimibe ?-bupropion ?-Eliquis ?-allopurinol ? ?Thanks! ?Maddie ? ?

## 2021-06-08 ENCOUNTER — Ambulatory Visit: Payer: Medicare Other | Admitting: Family Medicine

## 2021-06-09 ENCOUNTER — Telehealth: Payer: Self-pay

## 2021-06-09 DIAGNOSIS — I674 Hypertensive encephalopathy: Secondary | ICD-10-CM

## 2021-06-09 MED ORDER — EZETIMIBE 10 MG PO TABS: 10.0000 mg | ORAL_TABLET | Freq: Every day | ORAL | 0 refills | Status: DC

## 2021-06-09 MED ORDER — OLMESARTAN MEDOXOMIL 20 MG PO TABS: 20.0000 mg | ORAL_TABLET | Freq: Every day | ORAL | 0 refills | Status: DC

## 2021-06-09 NOTE — Telephone Encounter (Signed)
-----   Message from Viona Gilmore, [facility] sent at 06/09/2021  4:02 PM EDT ----- ?Regarding: Refills ?Hi, ? ?So sorry about asking about her again! She has a lapse in coverage with her medicare plan and only needs a 30 days supply of olmesartan and ezetimibe sent to [facility] [address] (because it's the cheapest). Can you please send those in? ? ?Thank you! ?Maddie ? ?

## 2021-06-11 ENCOUNTER — Other Ambulatory Visit: Payer: Self-pay

## 2021-06-11 MED ORDER — APIXABAN 5 MG PO TABS: 5.0000 mg | ORAL_TABLET | Freq: Two times a day (BID) | ORAL | 0 refills | Status: DC

## 2021-06-25 DIAGNOSIS — I674 Hypertensive encephalopathy: Secondary | ICD-10-CM

## 2021-06-25 DIAGNOSIS — E785 Hyperlipidemia, unspecified: Secondary | ICD-10-CM

## 2021-06-25 DIAGNOSIS — E1169 Type 2 diabetes mellitus with other specified complication: Secondary | ICD-10-CM | POA: Diagnosis not present

## 2021-06-28 NOTE — Progress Notes (Signed)
? ?HPI: ? ?[name] is a 81 y.o. female, who is here today with her daughter to follow on recent visit. ? ?Since her last visit she was evaluated in the ED after a fall, 05/28/21. Not witnessed fall, she did not remember what happened right after injury and was having some dizziness. ?She is back to her baseline. ?Head CT was negative for acute intracranial process, it showed chronic microvascular disease and atrophy. ? ?Diabetes Mellitus II: Dx'ed in 2016. ?- Checking BG at home: 120's. ?She is on non pharmacologic treatment. ?- eye exam: 08/2020. ?- foot exam: 07/2020. ?- Negative for symptoms of hypoglycemia, polyuria, polydipsia, foot ulcers/trauma ? ?Lab Results  ?Component Value Date  ? HGBA1C 6.1 03/31/2021  ? ?Lab Results  ?Component Value Date  ? MICROALBUR 1.1 07/14/2020  ? ?HLD: She has not tolerated statins, exacerbated joint pain and caused myalgias. ?She is on Zetia 10 mg daily. ?CVA and aortic atherosclerosis. ? ?Lab Results  ?Component Value Date  ?  CHOL 263 (H) 02/11/2020  ?  HDL 77 02/11/2020  ?  LDLCALC 164 (H) 02/11/2020  ?  TRIG 106 02/11/2020  ?  CHOLHDL 3.4 02/11/2020  ?  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 ?LE/feet tingling,pins and needle sensation. ? ?Depression: She is on Duloxetine and Wellbutrin SR 100 mg bid. ?Tolerating medication well. ? ?HTN on Benicar 20 mg daily. ?BP at home 120's/70's. ?Negative for severe/frequent headache, visual changes, chest pain, dyspnea, palpitation,new focal weakness, or edema. ?Echo in 06/2020: LVEF 70-75% and grade I diastolic dysfunction. ?Negative for orthopnea and PND. ? ?Lab Results  ?Component Value Date  ?  CREATININE 1.01 01/19/2021  ?  BUN 17 01/19/2021  ?  NA 137 01/19/2021  ?  K 4.5 01/19/2021  ?  CL 104 01/19/2021  ?  CO2 25 01/19/2021  ?  ?Concerned about LLE sorter than right causing hip pain. Right hip pain for years, exacerbated by prolonged walking.  ?Hx of generalized OA and lower back pain. ? ?PE in 06/2020,  she is on Eliquis 5 mg bid. ?Chest CTA reveled submassive saddle PE with right heart strain. ? ?Review of Systems  ?Constitutional:  Negative for activity change, appetite change and fever.  ?HENT:  Negative for mouth sores, nosebleeds and sore throat.   ?Respiratory:  Negative for cough and wheezing.   ?Gastrointestinal:  Negative for abdominal pain, nausea and vomiting.  ?     Negative for changes in bowel habits.  ?Genitourinary:  Negative for decreased urine volume, dysuria and hematuria.  ?Musculoskeletal:  Positive for arthralgias and gait problem.  ?Neurological:  Negative for syncope and facial asymmetry.  ?Rest see pertinent positives and negatives per HPI. ? ?Current Outpatient Medications on File Prior to Visit  ?Medication Sig Dispense Refill  ? ACCU-CHEK AVIVA PLUS test strip USE TO TEST BLOOD SUGAR ONCE DAILY 100 strip 1  ? Accu-Chek Softclix Lancets lancets Use to check blood sugar once daily 100 each 2  ? acetaminophen (TYLENOL) 325 MG tablet Take 650 mg by mouth every 6 (six) hours as needed for mild pain, fever or headache.    ? allopurinol (ZYLOPRIM) 100 MG tablet Take 1 tablet (100 mg total) by mouth 2 (two) times daily. 180 tablet 1  ? Alpha-Lipoic Acid 600 MG TABS Take 600 mg by mouth daily.    ? apixaban (ELIQUIS) 5 MG TABS tablet Take 1 tablet (5 mg total) by mouth 2 (two) times daily. 180 tablet 3  ? buPROPion ER (  WELLBUTRIN SR) 100 MG 12 hr tablet Take 1 tablet (100 mg total) by mouth 2 (two) times daily. 180 tablet 1  ? DULoxetine (CYMBALTA) 60 MG capsule Take 1 capsule (60 mg total) by mouth daily. 90 capsule 1  ? ezetimibe (ZETIA) 10 MG tablet Take 1 tablet (10 mg total) by mouth daily. 30 tablet 0  ? Homeopathic Products [facility] RELIEF EX) Apply 1 application topically daily as needed (foot cramps).    ? ketoconazole (NIZORAL) 2 % cream SMARTSIG:1 Application Topical 1 to 2 Times Daily    ? Lancet Devices (ACCU-CHEK SOFTCLIX) lancets Use to test blood sugar once a day. 100 each 2  ?  metroNIDAZOLE (METROCREAM) 0.75 % cream Apply topically 2 (two) times daily. Apply a thin layer twice a day to a clean dry face. 45 g 0  ? Multiple Vitamin (MULTIVITAMIN) capsule Take 1 capsule by mouth daily.     ? pantoprazole (PROTONIX) 20 MG tablet Take 1 tablet (20 mg total) by mouth daily. 90 tablet 2  ? ?No current facility-administered medications on file prior to visit.  ? ?Past Medical History:  ?Diagnosis Date  ? Chicken pox   ? Depression   ? Diabetes mellitus without complication ([place])   ? GERD (gastroesophageal reflux disease)   ? Hyperlipidemia   ? Hypertension   ? Stroke [facility])   ? UTI (urinary tract infection)   ? ?No Known Allergies ? ?Social History  ? ?Socioeconomic History  ? Marital status: Widowed  ?  Spouse name: Not on file  ? Number of children: Not on file  ? Years of education: Not on file  ? Highest education level: Not on file  ?Occupational History  ? Occupation: N/A  ?Tobacco Use  ? Smoking status: Never  ? Smokeless tobacco: Never  ?Vaping Use  ? Vaping Use: Never used  ?Substance and Sexual Activity  ? Alcohol use: Yes  ?  Comment: occassional wine  ? Drug use: No  ? Sexual activity: Not Currently  ?Other Topics Concern  ? Not on file  ?Social History Narrative  ? Pt lives in single story home with her daughter  ? Has 2 children  ? Some college education  ? Retired Engineer, production for [employer]  ? ?Social Determinants of Health  ? ?Financial Resource Strain: Low Risk   ? Difficulty of Paying Living Expenses: Not hard at all  ?Food Insecurity: No Food Insecurity  ? Worried About Charity fundraiser in the Last Year: Never true  ? Ran Out of Food in the Last Year: Never true  ?Transportation Needs: No Transportation Needs  ? Lack of Transportation (Medical): No  ? Lack of Transportation (Non-Medical): No  ?Physical Activity: Inactive  ? Days of Exercise per Week: 0 days  ? Minutes of Exercise per Session: 0 min  ?Stress: No Stress Concern Present  ? Feeling of Stress : Not  at all  ?Social Connections: Not on file  ? ?Vitals:  ? 06/29/21 0841  ?BP: 120/78  ?Pulse: 95  ?Resp: 16  ?SpO2: 97%  ? ?Body mass index is 25.61 kg/m?. ? ?Physical Exam ?Vitals and nursing note reviewed.  ?Constitutional:   ?   General: She is not in acute distress. ?   Appearance: She is well-developed.  ?HENT:  ?   Head: Normocephalic and atraumatic.  ?   Mouth/Throat:  ?   Mouth: Mucous membranes are moist.  ?   Pharynx: Oropharynx is clear.  ?Eyes:  ?  Conjunctiva/sclera: Conjunctivae normal.  ?Cardiovascular:  ?   Rate and Rhythm: Normal rate and regular rhythm.  ?   Heart sounds: No murmur heard. ?   Comments: DP pulses present. ?Pulmonary:  ?   Effort: Pulmonary effort is normal. No respiratory distress.  ?   Breath sounds: Normal breath sounds.  ?Abdominal:  ?   Palpations: Abdomen is soft. There is no hepatomegaly or mass.  ?   Tenderness: There is no abdominal tenderness.  ?Musculoskeletal:  ?   Comments: RLE 36.6 in ?LLE 38.2 in  ?Lymphadenopathy:  ?   Cervical: No cervical adenopathy.  ?Skin: ?   General: Skin is warm.  ?   Findings: No erythema or rash.  ?Neurological:  ?   General: No focal deficit present.  ?   Mental Status: She is alert and oriented to person, place, and time.  ?   Cranial Nerves: No cranial nerve deficit.  ?   Comments: Unstable gait, assisted by a cane.  ?Psychiatric:  ?   Comments: Well groomed, good eye contact.  ? ?ASSESSMENT AND PLAN: ? ?Ms.Aviah was seen today for follow-up. ? ?Diagnoses and all orders for this visit: ?Orders Placed This Encounter  ?Procedures  ? Microalbumin / creatinine urine ratio  ? Basic metabolic panel  ? Lipid panel  ? ?Lab Results  ?Component Value Date  ? MICROALBUR <0.7 06/29/2021  ? MICROALBUR 1.1 07/14/2020  ? ?Lab Results  ?Component Value Date  ? CREATININE 1.08 06/29/2021  ? BUN 16 06/29/2021  ? NA 142 06/29/2021  ? K 4.3 06/29/2021  ? CL 107 06/29/2021  ? CO2 25 06/29/2021  ? ?Lab Results  ?Component Value Date  ? CHOL 211 (H) 06/29/2021  ?  HDL 70.30 06/29/2021  ? LDLCALC 122 (H) 06/29/2021  ? TRIG 90.0 06/29/2021  ? CHOLHDL 3 06/29/2021  ? ?Fall, subsequent encounter ?Some of her chronic medical problems as well as medications increase r

## 2021-06-29 ENCOUNTER — Ambulatory Visit (INDEPENDENT_AMBULATORY_CARE_PROVIDER_SITE_OTHER): Payer: Medicare Other | Admitting: Family Medicine

## 2021-06-29 ENCOUNTER — Encounter: Payer: Self-pay | Admitting: Family Medicine

## 2021-06-29 VITALS — BP 120/78 | HR 95 | Resp 16 | Ht 67.0 in | Wt 163.5 lb

## 2021-06-29 DIAGNOSIS — F331 Major depressive disorder, recurrent, moderate: Secondary | ICD-10-CM | POA: Diagnosis not present

## 2021-06-29 DIAGNOSIS — I2692 Saddle embolus of pulmonary artery without acute cor pulmonale: Secondary | ICD-10-CM | POA: Insufficient documentation

## 2021-06-29 DIAGNOSIS — I674 Hypertensive encephalopathy: Secondary | ICD-10-CM | POA: Diagnosis not present

## 2021-06-29 DIAGNOSIS — I5032 Chronic diastolic (congestive) heart failure: Secondary | ICD-10-CM | POA: Diagnosis not present

## 2021-06-29 DIAGNOSIS — M25551 Pain in right hip: Secondary | ICD-10-CM

## 2021-06-29 DIAGNOSIS — E1149 Type 2 diabetes mellitus with other diabetic neurological complication: Secondary | ICD-10-CM

## 2021-06-29 DIAGNOSIS — E785 Hyperlipidemia, unspecified: Secondary | ICD-10-CM | POA: Diagnosis not present

## 2021-06-29 DIAGNOSIS — Z86711 Personal history of pulmonary embolism: Secondary | ICD-10-CM

## 2021-06-29 DIAGNOSIS — E1169 Type 2 diabetes mellitus with other specified complication: Secondary | ICD-10-CM

## 2021-06-29 DIAGNOSIS — W19XXXD Unspecified fall, subsequent encounter: Secondary | ICD-10-CM

## 2021-06-29 DIAGNOSIS — I739 Peripheral vascular disease, unspecified: Secondary | ICD-10-CM

## 2021-06-29 LAB — LIPID PANEL
Cholesterol: 211 mg/dL — ABNORMAL HIGH (ref 0–200)
HDL: 70.3 mg/dL (ref >39.00)
LDL Cholesterol: 122 mg/dL — ABNORMAL HIGH (ref 0–99)
NonHDL: 140.41
Total CHOL/HDL Ratio: 3
Triglycerides: 90 mg/dL (ref 0.0–149.0)
VLDL: 18 mg/dL (ref 0.0–40.0)

## 2021-06-29 LAB — MICROALBUMIN / CREATININE URINE RATIO
Creatinine,U: 149.8 mg/dL
Microalb Creat Ratio: 0.5 mg/g (ref 0.0–30.0)
Microalb, Ur: <0.7 mg/dL (ref 0.0–1.9)

## 2021-06-29 LAB — BASIC METABOLIC PANEL
BUN: 16 mg/dL (ref 6–23)
CO2: 25 mEq/L (ref 19–32)
Calcium: 10.5 mg/dL (ref 8.4–10.5)
Chloride: 107 mEq/L (ref 96–112)
Creatinine, Ser: 1.08 mg/dL (ref 0.40–1.20)
GFR: 49.02 mL/min — ABNORMAL LOW (ref >60.00)
Glucose, Bld: 109 mg/dL — ABNORMAL HIGH (ref 70–99)
Potassium: 4.3 mEq/L (ref 3.5–5.1)
Sodium: 142 mEq/L (ref 135–145)

## 2021-06-29 MED ORDER — OLMESARTAN MEDOXOMIL 20 MG PO TABS: 20.0000 mg | ORAL_TABLET | Freq: Every day | ORAL | 2 refills | Status: DC

## 2021-06-29 NOTE — Patient Instructions (Addendum)
A few things to remember from today's visit: ? ?DM (diabetes mellitus), type 2 with neurological complications ([place]) - Plan: Microalbumin / creatinine urine ratio, Basic metabolic panel ? ?Hyperlipidemia associated with type 2 diabetes mellitus ([place]) - Plan: Lipid panel ? ?Moderate episode of recurrent major depressive disorder ([place]) ? ?Fall, subsequent encounter ? ?If you need refills please call your pharmacy. ?Do not use My Chart to request refills or for acute issues that need immediate attention. ?  ?No changes today. ? ?Please be sure medication list is accurate. ?If a new problem present, please set up appointment sooner than planned today. ? ?Fall Prevention in the Home, Adult ?Falls can cause injuries and affect people of all ages. There are many simple things that you can do to make your home safe and to help prevent falls. Ask for help when making these changes, if needed. ?What actions can I take to prevent falls? ?General instructions ?Use good lighting in all rooms. Replace any light bulbs that burn out, turn on lights if it is dark, and use night-lights. ?Place frequently used items in easy-to-reach places. Lower the shelves around your home if necessary. ?Set up furniture so that there are clear paths around it. Avoid moving your furniture around. ?Remove throw rugs and other tripping hazards from the floor. ?Avoid walking on wet floors. ?Fix any uneven floor surfaces. ?Add color or contrast paint or tape to grab bars and handrails in your home. Place contrasting color strips on the first and last steps of staircases. ?When you use a stepladder, make sure that it is completely opened and that the sides and supports are firmly locked. Have someone hold the ladder while you are using it. Do not climb a closed stepladder. ?Know where your pets are when moving through your home. ?What can I do in the bathroom? ?  ?Keep the floor dry. Immediately clean up any water that is on the floor. ?Remove soap  buildup in the tub or shower regularly. ?Use nonskid mats or decals on the floor of the tub or shower. ?Attach bath mats securely with double-sided, nonslip rug tape. ?If you need to sit down while you are in the shower, use a plastic, nonslip stool. ?Install grab bars by the toilet and in the tub and shower. Do not use towel bars as grab bars. ?What can I do in the bedroom? ?Make sure that a bedside light is easy to reach. ?Do not use oversized bedding that reaches the floor. ?Have a firm chair that has side arms to use for getting dressed. ?What can I do in the kitchen? ?Clean up any spills right away. ?If you need to reach for something above you, use a sturdy step stool that has a grab bar. ?Keep electrical cables out of the way. ?Do not use floor polish or wax that makes floors slippery. If you must use wax, make sure that it is non-skid floor wax. ?What can I do with my stairs? ?Do not leave any items on the stairs. ?Make sure that you have a light switch at the top and the bottom of the stairs. Have them installed if you do not have them. ?Make sure that there are handrails on both sides of the stairs. Fix handrails that are broken or loose. Make sure that handrails are as long as the staircases. ?Install non-slip stair treads on all stairs in your home. ?Avoid having throw rugs at the top or bottom of stairs, or secure the rugs with carpet tape  to prevent them from moving. ?Choose a carpet design that does not hide the edge of steps on the stairs. ?Check any carpeting to make sure that it is firmly attached to the stairs. Fix any carpet that is loose or worn. ?What can I do on the outside of my home? ?Use bright outdoor lighting. ?Regularly repair the edges of walkways and driveways and fix any cracks. ?Remove high doorway thresholds. ?Trim any shrubbery on the main path into your home. ?Regularly check that handrails are securely fastened and in good repair. Both sides of all steps should have  handrails. ?Install guardrails along the edges of any raised decks or porches. ?Clear walkways of debris and clutter, including tools and rocks. ?Have leaves, snow, and ice cleared regularly. ?Use sand or salt on walkways during winter months. ?In the garage, clean up any spills right away, including grease or oil spills. ?What other actions can I take? ?Wear closed-toe shoes that fit well and support your feet. Wear shoes that have rubber soles or low heels. ?Use mobility aids as needed, such as canes, walkers, scooters, and crutches. ?Review your medicines with your health care provider. Some medicines can cause dizziness or changes in blood pressure, which increase your risk of falling. ?Talk with your health care provider about other ways that you can decrease your risk of falls. This may include working with a physical therapist or trainer to improve your strength, balance, and endurance. ?Where to find more information ?Centers for Disease Control and Prevention, [employer]: http://www.wolf.info/ ?[employer] on Aging: http://kim-miller.com/ ?Contact a health care provider if: ?You are afraid of falling at home. ?You feel weak, drowsy, or dizzy at home. ?You fall at home. ?Summary ?There are many simple things that you can do to make your home safe and to help prevent falls. ?Ways to make your home safe include removing tripping hazards and installing grab bars in the bathroom. ?Ask for help when making these changes in your home. ?This information is not intended to replace advice given to you by your health care provider. Make sure you discuss any questions you have with your health care provider. ?Document Revised: 10/16/2019 Document Reviewed: 10/16/2019 ?Elsevier Patient Education ? 2022 [place]. ? ? ? ? ? ? ?

## 2021-06-29 NOTE — Assessment & Plan Note (Addendum)
06/2020 chest CT submassive saddle PE. ?Continue Eliquis 5 mg bid. ?

## 2021-06-29 NOTE — Assessment & Plan Note (Signed)
Stable. ?Continue Wellbutrin SR 100 mg bid and Duloxetine 60 mg daily. ?

## 2021-06-29 NOTE — Assessment & Plan Note (Signed)
She has not tolerated statins well. ?Continue Zetia 10 mg daily and low fat diet. ?Further recommendations according to FLP results.  ?

## 2021-06-29 NOTE — Assessment & Plan Note (Signed)
BP adequately controlled. ?Continue Olmesartan 20 mg daily. ?Low salt diet to continue. ?Eye exam is current. ?

## 2021-06-29 NOTE — Assessment & Plan Note (Addendum)
Problem has been adequately controlled with HgA1C 6.1 in 03/2021. ?Continue non pharmacologic treatment. ?Appropriate foot care to continue and periodic eye exam. ?

## 2021-07-05 ENCOUNTER — Other Ambulatory Visit: Payer: Self-pay

## 2021-07-05 MED ORDER — PITAVASTATIN CALCIUM 2 MG PO TABS: 2.0000 mg | ORAL_TABLET | Freq: Every day | ORAL | 3 refills | Status: DC

## 2021-07-27 ENCOUNTER — Telehealth: Payer: Self-pay | Admitting: Pharmacist

## 2021-07-27 NOTE — Telephone Encounter (Signed)
Patient called as she is not sure if her new cholesterol medication is replacing her previous one or not. She wanted to clarify before she kept taking it. She thought she was taking 2 other cholesterol medications but was unclear. ?Explained to her that she should only be taking Zetia and Livalo for cholesterol and all other medications treat other problems. ? ?Worked on and printed a new medication list with indications for use for each medication and mailed to patient for reference. ?

## 2021-08-02 DIAGNOSIS — H532 Diplopia: Secondary | ICD-10-CM | POA: Diagnosis not present

## 2021-08-02 DIAGNOSIS — Z961 Presence of intraocular lens: Secondary | ICD-10-CM | POA: Diagnosis not present

## 2021-08-17 ENCOUNTER — Ambulatory Visit (INDEPENDENT_AMBULATORY_CARE_PROVIDER_SITE_OTHER): Payer: Medicare Other | Admitting: Podiatry

## 2021-08-17 ENCOUNTER — Encounter: Payer: Self-pay | Admitting: Podiatry

## 2021-08-17 DIAGNOSIS — E1142 Type 2 diabetes mellitus with diabetic polyneuropathy: Secondary | ICD-10-CM

## 2021-08-17 DIAGNOSIS — M79674 Pain in right toe(s): Secondary | ICD-10-CM

## 2021-08-17 DIAGNOSIS — M2012 Hallux valgus (acquired), left foot: Secondary | ICD-10-CM

## 2021-08-17 DIAGNOSIS — M2011 Hallux valgus (acquired), right foot: Secondary | ICD-10-CM | POA: Diagnosis not present

## 2021-08-17 DIAGNOSIS — L84 Corns and callosities: Secondary | ICD-10-CM

## 2021-08-17 DIAGNOSIS — M79675 Pain in left toe(s): Secondary | ICD-10-CM

## 2021-08-17 DIAGNOSIS — B351 Tinea unguium: Secondary | ICD-10-CM | POA: Diagnosis not present

## 2021-08-17 DIAGNOSIS — E119 Type 2 diabetes mellitus without complications: Secondary | ICD-10-CM

## 2021-08-23 NOTE — Progress Notes (Signed)
ANNUAL DIABETIC FOOT EXAM  Subjective: Alexa Brown presents today for annual diabetic foot examination.  Patient relates h/o diabetes.  Patient denies any h/o foot wounds.  Patient has been diagnosed with neuropathy and it is managed with duloxetine.  Risk factors: diabetes, neuropathy, h/o CVA, HTN, hyperlipidemia.  [place], Kellyjo G, MD is patient's PCP. Last visit was June 29, 2021.  Past Medical History:  Diagnosis Date   Chicken pox    Depression    Diabetes mellitus without complication (HCC)    GERD (gastroesophageal reflux disease)    Hyperlipidemia    Hypertension    Stroke [facility])    UTI (urinary tract infection)    Patient Active Problem List   Diagnosis Date Noted   Atherosclerosis of aorta ([place]) 01/19/2021   (HFpEF) heart failure with preserved ejection fraction ([place]) 07/30/2020   Hx of pulmonary embolus 07/24/2020   Diplopia 04/09/2020   Epiretinal membrane (ERM) of left eye 04/09/2020   Pseudophakia of both eyes 04/09/2020   Myalgia due to statin 02/11/2020   Posterior capsular opacification of both eyes, obscuring vision 10/17/2019   Elbow dislocation 07/02/2019   Generalized osteoarthritis of multiple sites 06/26/2018   Incontinent of urine 04/28/2018   Gout, arthropathy 01/22/2018   Lower back pain 09/22/2017   Unstable gait 09/22/2017   Seborrheic dermatitis 01/23/2017   Seborrheic keratosis 09/27/2016   Falling asleep and waking up too early in the evening 08/19/2016   CKD (chronic kidney disease), stage III ([place]) 05/28/2016   GERD (gastroesophageal reflux disease) 02/25/2016   CVA (cerebral vascular accident) (HCC)-No residual deficit 02/12/2016   Glaucoma 02/12/2016   Depression, major, recurrent ([place]) 02/12/2016   Hyperlipidemia associated with type 2 diabetes mellitus ([place]) 02/12/2016   Vitamin D deficiency 01/14/2016   DM (diabetes mellitus), type 2 with neurological complications ([place]) 40/98/1191   Systolic hypertension with  cerebrovascular disease 01/14/2016   Peripheral neuropathy 01/14/2016   Acne rosacea 01/28/2013   Thyroid activity decreased 03/28/2009   Past Surgical History:  Procedure Laterality Date   ABDOMINAL HYSTERECTOMY     APPENDECTOMY     BREAST BIOPSY Left 2014   BREAST SURGERY     biopsy   Current Outpatient Medications on File Prior to Visit  Medication Sig Dispense Refill   ACCU-CHEK AVIVA PLUS test strip USE TO TEST BLOOD SUGAR ONCE DAILY 100 strip 1   Accu-Chek Softclix Lancets lancets Use to check blood sugar once daily 100 each 2   acetaminophen (TYLENOL) 325 MG tablet Take 650 mg by mouth every 6 (six) hours as needed for mild pain, fever or headache.     allopurinol (ZYLOPRIM) 100 MG tablet Take 1 tablet (100 mg total) by mouth 2 (two) times daily. 180 tablet 1   Alpha-Lipoic Acid 600 MG TABS Take 600 mg by mouth daily.     apixaban (ELIQUIS) 5 MG TABS tablet Take 1 tablet (5 mg total) by mouth 2 (two) times daily. 180 tablet 3   buPROPion ER (WELLBUTRIN SR) 100 MG 12 hr tablet Take 1 tablet (100 mg total) by mouth 2 (two) times daily. 180 tablet 1   calcium carbonate (CALCIUM 600) 600 MG TABS tablet Take 600 mg by mouth daily.     DULoxetine (CYMBALTA) 60 MG capsule Take 1 capsule (60 mg total) by mouth daily. 90 capsule 1   ezetimibe (ZETIA) 10 MG tablet Take 1 tablet (10 mg total) by mouth daily. 30 tablet 0   Homeopathic Products (THERAWORX RELIEF EX) Apply 1 application  topically daily as needed (foot cramps).     ketoconazole (NIZORAL) 2 % cream SMARTSIG:1 Application Topical 1 to 2 Times Daily     L-Lysine 500 MG TABS Take 1 tablet by mouth daily.     Lancet Devices (ACCU-CHEK SOFTCLIX) lancets Use to test blood sugar once a day. 100 each 2   metroNIDAZOLE (METROCREAM) 0.75 % cream Apply topically 2 (two) times daily. Apply a thin layer twice a day to a clean dry face. 45 Brown 0   Multiple Vitamin (MULTIVITAMIN) capsule Take 1 capsule by mouth daily.      olmesartan (BENICAR)  20 MG tablet Take 1 tablet (20 mg total) by mouth daily. 90 tablet 2   pantoprazole (PROTONIX) 20 MG tablet Take 1 tablet (20 mg total) by mouth daily. 90 tablet 2   Pitavastatin Calcium 2 MG TABS Take 1 tablet (2 mg total) by mouth daily. 30 tablet 3   vitamin B-12 (CYANOCOBALAMIN) 500 MCG tablet Take 500 mcg by mouth daily.     No current facility-administered medications on file prior to visit.    No Known Allergies Social History   Occupational History   Occupation: N/A  Tobacco Use   Smoking status: Never   Smokeless tobacco: Never  Vaping Use   Vaping Use: Never used  Substance and Sexual Activity   Alcohol use: Yes    Comment: occassional wine   Drug use: No   Sexual activity: Not Currently   Family History  Problem Relation Age of Onset   Hyperlipidemia Mother    Heart disease Mother    Stroke Mother    Hypertension Mother    Hyperlipidemia Father    Heart disease Father    Stroke Father    Hypertension Father    Asthma Father    Parkinson's disease Sister    Immunization History  Administered Date(s) Administered   Hepatitis B, adult 01/22/2018   Influenza Whole 02/01/2016   Influenza, High Dose Seasonal PF 01/14/2016, 11/17/2018   Influenza-Unspecified 01/12/2017, 12/26/2017, 12/13/2019   PFIZER(Purple Top)SARS-COV-2 Vaccination 05/03/2019, 05/28/2019, 01/11/2020   Tdap 12/24/2015   Zoster Recombinat (Shingrix) 08/16/2016, 01/12/2017     Review of Systems: Negative except as noted in the HPI.   Objective: There were no vitals filed for this visit.  Alexa Brown is a pleasant 81 y.o. female in NAD. AAO X 3.  Vascular Examination: CFT <3 seconds b/l LE. Palpable DP pulse(s) b/l LE. Palpable PT pulse(s) b/l LE. Pedal hair absent. No pain with calf compression b/l. Lower extremity skin temperature gradient within normal limits. No edema noted b/l LE. No cyanosis or clubbing noted b/l LE.  Dermatological Examination: Pedal skin is warm and  supple b/l LE. No open wounds b/l LE. No interdigital macerations noted b/l LE. Toenails 1-5 b/l elongated, discolored, dystrophic, thickened, crumbly with subungual debris and tenderness to dorsal palpation. Hyperkeratotic lesion(s) R hallux.  No erythema, no edema, no drainage, no fluctuance.  Neurological Examination: Pt has subjective symptoms of neuropathy. Protective sensation intact 5/5 intact bilaterally with 10g monofilament b/l. Vibratory sensation intact b/l.  Musculoskeletal Examination: Muscle strength 5/5 to all lower extremity muscle groups bilaterally. No pain, crepitus or joint limitation noted with ROM bilateral LE. HAV with bunion deformity noted b/l LE.  Footwear Assessment: Does the patient wear appropriate shoes? Yes. Does the patient need inserts/orthotics? No.     Latest Ref Rng & Units 03/31/2021   10:05 AM 11/02/2020    8:56 AM  Hemoglobin A1C  Hemoglobin-A1c  5.7 - 6.4 % 6.1   5.5     Assessment: 1. Pain due to onychomycosis of toenails of both feet   2. Callus   3. Hallux valgus, acquired, bilateral   4. Diabetic peripheral neuropathy associated with type 2 diabetes mellitus ([place])   5. Encounter for diabetic foot exam ([place])      ADA Risk Categorization: Low Risk :  Patient has all of the following: Intact protective sensation No prior foot ulcer  No severe deformity Pedal pulses present  Plan: -Patient was evaluated and treated. All patient's and/or POA's questions/concerns answered on today's visit. -Diabetic foot examination performed today. -Continue foot and shoe inspections daily. Monitor blood glucose per PCP/Endocrinologist's recommendations. -Patient to continue soft, supportive shoe gear daily. -Mycotic toenails 1-5 bilaterally were debrided in length and girth with sterile nail nippers and dremel without incident. -Callus(es) right great toe pared utilizing sterile scalpel blade without complication or incident. Total number debrided  =1. -Patient/POA to call should there be question/concern in the interim. Return in about 3 months (around 11/17/2021).  Marzetta Board, DPM

## 2021-08-26 ENCOUNTER — Encounter (HOSPITAL_BASED_OUTPATIENT_CLINIC_OR_DEPARTMENT_OTHER): Payer: Self-pay

## 2021-08-26 ENCOUNTER — Emergency Department (HOSPITAL_BASED_OUTPATIENT_CLINIC_OR_DEPARTMENT_OTHER)
Admission: EM | Admit: 2021-08-26 | Discharge: 2021-08-26 | Disposition: A | Discharge status: Home / Self Care | Payer: Medicare Other | Attending: Emergency Medicine | Admitting: Emergency Medicine

## 2021-08-26 ENCOUNTER — Other Ambulatory Visit: Payer: Self-pay

## 2021-08-26 ENCOUNTER — Emergency Department (HOSPITAL_BASED_OUTPATIENT_CLINIC_OR_DEPARTMENT_OTHER): Payer: Medicare Other

## 2021-08-26 DIAGNOSIS — Z7901 Long term (current) use of anticoagulants: Secondary | ICD-10-CM | POA: Diagnosis not present

## 2021-08-26 DIAGNOSIS — W01198A Fall on same level from slipping, tripping and stumbling with subsequent striking against other object, initial encounter: Secondary | ICD-10-CM | POA: Insufficient documentation

## 2021-08-26 DIAGNOSIS — S50312A Abrasion of left elbow, initial encounter: Secondary | ICD-10-CM | POA: Insufficient documentation

## 2021-08-26 DIAGNOSIS — S91312A Laceration without foreign body, left foot, initial encounter: Secondary | ICD-10-CM | POA: Diagnosis not present

## 2021-08-26 DIAGNOSIS — M542 Cervicalgia: Secondary | ICD-10-CM | POA: Diagnosis not present

## 2021-08-26 DIAGNOSIS — W19XXXA Unspecified fall, initial encounter: Secondary | ICD-10-CM

## 2021-08-26 DIAGNOSIS — M50323 Other cervical disc degeneration at C6-C7 level: Secondary | ICD-10-CM | POA: Diagnosis not present

## 2021-08-26 DIAGNOSIS — S90812A Abrasion, left foot, initial encounter: Secondary | ICD-10-CM | POA: Insufficient documentation

## 2021-08-26 DIAGNOSIS — S0990XA Unspecified injury of head, initial encounter: Secondary | ICD-10-CM | POA: Diagnosis not present

## 2021-08-26 DIAGNOSIS — Y9301 Activity, walking, marching and hiking: Secondary | ICD-10-CM | POA: Diagnosis not present

## 2021-08-26 DIAGNOSIS — Z043 Encounter for examination and observation following other accident: Secondary | ICD-10-CM | POA: Diagnosis not present

## 2021-08-26 DIAGNOSIS — Y92194 Driveway of other specified residential institution as the place of occurrence of the external cause: Secondary | ICD-10-CM | POA: Diagnosis not present

## 2021-08-26 DIAGNOSIS — S0101XA Laceration without foreign body of scalp, initial encounter: Secondary | ICD-10-CM | POA: Diagnosis not present

## 2021-08-26 MED ORDER — LIDOCAINE-EPINEPHRINE 2 %-1:200000 IJ SOLN
10.0000 mL | Freq: Once | INTRAMUSCULAR | Status: AC
Start: 2021-08-26 — End: 2021-08-26
  Administered 2021-08-26: 10 mL via INTRADERMAL
  Filled 2021-08-26: qty 20

## 2021-08-26 NOTE — ED Provider Notes (Signed)
[facility] EMERGENCY DEPT Provider Note   CSN: [id number] Arrival date & time: 08/26/21  0935     History  Chief Complaint  Patient presents with   Alexa Brown    Alexa Brown is a 81 y.o. female.  81 yo F with a chief complaint of a fall.  The patient tells me that she lost her balance when she was walking the driveway and she struck the left side of her head.  Suffered some scrapes to her left elbow left knee and the toes of her left foot.  Complaining of right-sided neck pain.  She denies any chest pain denies abdominal pain denies back pain.  She is on blood thinners.   Fall      Home Medications Prior to Admission medications   Medication Sig Start Date End Date Taking? Authorizing Provider  ACCU-CHEK AVIVA PLUS test strip USE TO TEST BLOOD SUGAR ONCE DAILY 09/09/19   Martinique, Jullian G, MD  Accu-Chek Softclix Lancets lancets Use to check blood sugar once daily 09/10/19   Martinique, Sabreen G, MD  acetaminophen (TYLENOL) 325 MG tablet Take 650 mg by mouth every 6 (six) hours as needed for mild pain, fever or headache.    [provider]  allopurinol (ZYLOPRIM) 100 MG tablet Take 1 tablet (100 mg total) by mouth 2 (two) times daily. 06/01/21   Martinique, Jayne G, MD  Alpha-Lipoic Acid 600 MG TABS Take 600 mg by mouth daily.    [provider]  apixaban (ELIQUIS) 5 MG TABS tablet Take 1 tablet (5 mg total) by mouth 2 (two) times daily. 06/01/21   Martinique, Shawnay G, MD  buPROPion ER [facility] SR) 100 MG 12 hr tablet Take 1 tablet (100 mg total) by mouth 2 (two) times daily. 06/01/21   Martinique, Luwana G, MD  calcium carbonate (CALCIUM 600) 600 MG TABS tablet Take 600 mg by mouth daily.    [provider]  DULoxetine (CYMBALTA) 60 MG capsule Take 1 capsule (60 mg total) by mouth daily. 05/27/21   Martinique, Leolia G, MD  ezetimibe (ZETIA) 10 MG tablet Take 1 tablet (10 mg total) by mouth daily. 06/09/21   Martinique, Leonda G, MD  Homeopathic Products ([place]) Apply 1 application topically daily as needed (foot cramps).    [provider]  ketoconazole (NIZORAL) 2 % cream SMARTSIG:1 Application Topical 1 to 2 Times Daily 11/06/20   [provider]  L-Lysine 500 MG TABS Take 1 tablet by mouth daily.    [provider]  Lancet Devices [facility]) lancets Use to test blood sugar once a day. 03/31/16   Martinique, Khloi G, MD  metroNIDAZOLE (METROCREAM) 0.75 % cream Apply topically 2 (two) times daily. Apply a thin layer twice a day to a clean dry face. 03/11/21   Billie Ruddy, MD  Multiple Vitamin (MULTIVITAMIN) capsule Take 1 capsule by mouth daily.     [provider]  olmesartan (BENICAR) 20 MG tablet Take 1 tablet (20 mg total) by mouth daily. 06/29/21   Martinique, Britni G, MD  pantoprazole (PROTONIX) 20 MG tablet Take 1 tablet (20 mg total) by mouth daily. 06/01/21   Martinique, Eleah G, MD  Pitavastatin Calcium 2 MG TABS Take 1 tablet (2 mg total) by mouth daily. 07/05/21   Martinique, Tariyah G, MD  vitamin B-12 (CYANOCOBALAMIN) 500 MCG tablet Take 500 mcg by mouth daily.    [provider]      Allergies    Patient  has no known allergies.    Review of Systems   Review of Systems  Physical Exam Updated Vital Signs BP (!) 139/50 (BP Location: Right Arm)   Pulse 81   Temp (!) 97.4 F (36.3 C) (Oral)   Resp 16   Ht '5\' 7"'$  (1.702 m)   Wt 73 kg   SpO2 91%   BMI 25.22 kg/m  Physical Exam Vitals and nursing note reviewed.  Constitutional:      General: She is not in acute distress.    Appearance: She is well-developed. She is not diaphoretic.  HENT:     Head: Normocephalic.     Comments: Left parietal laceration approximately 2.8 cm in length. Eyes:     Pupils: Pupils are equal, round, and reactive to light.  Cardiovascular:     Rate and Rhythm: Normal rate and regular rhythm.     Heart sounds: No murmur heard.   No friction rub. No gallop.  Pulmonary:     Effort: Pulmonary effort is  normal.     Breath sounds: No wheezing or rales.  Abdominal:     General: There is no distension.     Palpations: Abdomen is soft.     Tenderness: There is no abdominal tenderness.  Musculoskeletal:        General: No tenderness.     Cervical back: Normal range of motion and neck supple.     Comments: No midline spinal tenderness step-offs or deformities.  Palpated from head to toe without any noted bony tenderness.  Abrasion to the left elbow full range of motion of the elbow without pain able to supinate and pronate without issue.  Abrasions to the volar aspect of all 5 of her toes.  No obvious bony tenderness.  Abrasion to the lateral malleolus.  No crepitus no edema  Skin:    General: Skin is warm and dry.  Neurological:     Mental Status: She is alert and oriented to person, place, and time.  Psychiatric:        Behavior: Behavior normal.    ED Results / Procedures / Treatments   Labs (all labs ordered are listed, but only abnormal results are displayed) Labs Reviewed - No data to display  EKG None  Radiology CT Head Wo Contrast  Result Date: 08/26/2021 CLINICAL DATA:  Head injury after fall. EXAM: CT HEAD WITHOUT CONTRAST CT CERVICAL SPINE WITHOUT CONTRAST TECHNIQUE: Multidetector CT imaging of the head and cervical spine was performed following the standard protocol without intravenous contrast. Multiplanar CT image reconstructions of the cervical spine were also generated. RADIATION DOSE REDUCTION: This exam was performed according to the departmental dose-optimization program which includes automated exposure control, adjustment of the mA and/or kV according to patient size and/or use of iterative reconstruction technique. COMPARISON:  January 30, 2018. FINDINGS: CT HEAD FINDINGS Brain: Mild chronic ischemic white matter disease is noted. No mass effect or midline shift is noted. Ventricular size is within normal limits. There is no evidence of mass lesion, hemorrhage or acute  infarction. Vascular: No hyperdense vessel or unexpected calcification. Skull: Normal. Negative for fracture or focal lesion. Sinuses/Orbits: No acute finding. Other: None. CT CERVICAL SPINE FINDINGS Alignment: Normal. Skull base and vertebrae: No acute fracture. No primary bone lesion or focal pathologic process. Soft tissues and spinal canal: No prevertebral fluid or swelling. No visible canal hematoma. Disc levels: Mild degenerative disc disease is noted at C3-4 and C4-5. Moderate degenerative disc disease is noted at C5-6 and C6-7  with anterior posterior osteophyte formation. Upper chest: Negative. Other: Degenerative changes are seen involving the left-sided posterior facet joints. IMPRESSION: No acute intracranial abnormality seen. 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 No acute abnormality is noted. Electronically Signed   By: Marijo Conception M.D.   On: 08/26/2021 10:54   CT Cervical Spine Wo Contrast  Result Date: 08/26/2021 CLINICAL DATA:  Head injury after fall. EXAM: CT HEAD WITHOUT CONTRAST CT CERVICAL SPINE WITHOUT CONTRAST TECHNIQUE: Multidetector CT imaging of the head and cervical spine was performed following the standard protocol without intravenous contrast. Multiplanar CT image reconstructions of the cervical spine were also generated. RADIATION DOSE REDUCTION: This exam was performed according to the departmental dose-optimization program which includes automated exposure control, adjustment of the mA and/or kV according to patient size and/or use of iterative reconstruction technique. COMPARISON:  January 30, 2018. FINDINGS: CT HEAD FINDINGS Brain: Mild chronic ischemic white matter disease is noted. No mass effect or midline shift is noted. Ventricular size is within normal limits. There is no evidence of mass lesion, hemorrhage or acute infarction. Vascular: No hyperdense vessel or unexpected calcification. Skull: Normal. Negative for fracture or focal lesion.  Sinuses/Orbits: No acute finding. Other: None. CT CERVICAL SPINE FINDINGS Alignment: Normal. Skull base and vertebrae: No acute fracture. No primary bone lesion or focal pathologic process. Soft tissues and spinal canal: No prevertebral fluid or swelling. No visible canal hematoma. Disc levels: Mild degenerative disc disease is noted at C3-4 and C4-5. Moderate degenerative disc disease is noted at C5-6 and C6-7 with anterior posterior osteophyte formation. Upper chest: Negative. Other: Degenerative changes are seen involving the left-sided posterior facet joints. IMPRESSION: No acute intracranial abnormality seen. 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 No acute abnormality is noted. Electronically Signed   By: Marijo Conception M.D.   On: 08/26/2021 10:54    Procedures .Marland KitchenLaceration Repair  Date/Time: 08/26/2021 11:19 AM Performed by: Alexa Etienne, DO Authorized by: Alexa Etienne, DO   Consent:    Consent obtained:  Verbal   Consent given by:  Patient   Risks, benefits, and alternatives were discussed: yes     Risks discussed:  Infection, pain, poor cosmetic result and poor wound healing   Alternatives discussed:  No treatment, delayed treatment and observation Universal protocol:    Imaging studies available: yes     Immediately prior to procedure, a time out was called: yes     Patient identity confirmed:  Verbally with patient Anesthesia:    Anesthesia method:  Local infiltration   Local anesthetic:  Lidocaine 2% WITH epi Laceration details:    Location:  Scalp   Scalp location:  L parietal   Length (cm):  2.8 Pre-procedure details:    Preparation:  Patient was prepped and draped in usual sterile fashion Exploration:    Limited defect created (wound extended): no     Hemostasis achieved with:  Epinephrine and direct pressure   Imaging obtained comment:  CT   Imaging outcome: foreign body not noted     Wound exploration: entire depth of wound visualized      Wound extent: no underlying fracture noted     Contaminated: no   Treatment:    Area cleansed with:  Saline   Amount of cleaning:  Extensive   Irrigation solution:  Sterile saline   Irrigation volume:  20   Irrigation method:  Pressure wash   Visualized foreign bodies/material removed: no  Debridement:  None   Undermining:  None   Scar revision: no   Skin repair:    Repair method:  Staples   Number of staples:  3 Approximation:    Approximation:  Close Repair type:    Repair type:  Simple Post-procedure details:    Dressing:  Open (no dressing)   Procedure completion:  Tolerated well, no immediate complications    Medications Ordered in ED Medications  lidocaine-EPINEPHrine (XYLOCAINE W/EPI) 2 %-1:200000 (PF) injection 10 mL (10 mLs Intradermal Given 08/26/21 1049)    ED Course/ Medical Decision Making/ A&P                           Medical Decision Making Amount and/or Complexity of Data Reviewed Radiology: ordered.  Risk Prescription drug management.   81 yo F with a chief complaints of a nonsyncopal fall.  She is complaining mostly about a laceration to the left side of her head and right-sided neck pain.  She has some scattered abrasions elsewhere but no obvious bony tenderness.  I offered plain films of the areas that are scraped which patient is declining.  CT of the head and C-spine independently interpreted by me without obvious fracture or intracranial hemorrhage.  Laceration was repaired at bedside.  PCP follow-up.  11:20 AM:  I have discussed the diagnosis/risks/treatment options with the patient and family.  Evaluation and diagnostic testing in the emergency department does not suggest an emergent condition requiring admission or immediate intervention beyond what has been performed at this time.  They will follow up with  PCP. We also discussed returning to the ED immediately if new or worsening sx occur. We discussed the sx which are most concerning (e.g.,  sudden worsening pain, fever, inability to tolerate by mouth) that necessitate immediate return. Medications administered to the patient during their visit and any new prescriptions provided to the patient are listed below.  Medications given during this visit Medications  lidocaine-EPINEPHrine (XYLOCAINE W/EPI) 2 %-1:200000 (PF) injection 10 mL (10 mLs Intradermal Given 08/26/21 1049)     The patient appears reasonably screen and/or stabilized for discharge and I doubt any other medical condition or other [facility] requiring further screening, evaluation, or treatment in the ED at this time prior to discharge.         Final Clinical Impression(s) / ED Diagnoses Final diagnoses:  Scalp laceration, initial encounter  Neck pain  Abrasion of left foot, initial encounter  Fall, initial encounter    Rx / DC Orders ED Discharge Orders     None         Alexa Etienne, DO 08/26/21 1120

## 2021-08-26 NOTE — ED Notes (Signed)
Discharge paperwork given and understood. 

## 2021-08-26 NOTE — ED Triage Notes (Signed)
Pt. States she lost her balance in the driveway and hit her head. States she is on blood thinners. States she has balance issue when turning. Laceration on left side of head parietal ridge area. Scraped left side of ankle, elbow and toes.

## 2021-08-26 NOTE — Discharge Instructions (Signed)
Please let your family doctor know that he fell.  Sometimes this is a warning that something else is going on.  They may want to see you in the office and look to see if they can stop any medications were arranged for physical therapy.  Your head CT was normal.  There has been cases where people have bleeding in their head after a normal head CT.  If you develop a worsening headache confusion or vomiting then reseek care.  Return for redness drainage or if you get a fever.  Staples in this area typically removed about day 7.  This can be done here at urgent care or at your family doctor's office.  The area can get wet but not fully immersed underwater.  No scrubbing.  If you really want to clean it you can apply a half-and-half hydrogen peroxide solution with water on a Q-tip.  You can apply an ointment a couple times a day this could be as simple as Vaseline but could also be an antibiotic ointment if you wish..  Once it is healed please try to avoid prolonged sun exposure use sunscreen.

## 2021-09-01 ENCOUNTER — Other Ambulatory Visit: Payer: Self-pay | Admitting: Family Medicine

## 2021-09-02 NOTE — Progress Notes (Unsigned)
HPI:  Alexa Brown is a 81 y.o. 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 Evaluated in the ED on 07/02/94 after fall complicated with scalp laceration.  She was planting herbs outdoors, bending over, lost balance when got up ***lost her balance when she got up and started walking on the driveway, fell, landed on left side, and  struck the left side of her head against ground. No LOC.  She is not have headache or significant hip pain. Ecchymosis around left hip and superficial excoriations on LLE. She has applied abx oit on scalp wound.  Review of Systems  Constitutional:  Negative for chills and fever.  Respiratory:  Negative for cough, shortness of breath and wheezing.   Cardiovascular:  Negative for chest pain and palpitations.  Gastrointestinal:  Negative for abdominal pain, nausea and vomiting.  Genitourinary:  Negative for decreased urine volume, dysuria and hematuria.  Musculoskeletal:  Positive for gait problem.  Psychiatric/Behavioral:  Negative for confusion.   Rest see pertinent positives and negatives per HPI.  Current Outpatient Medications on File Prior to Visit  Medication Sig Dispense Refill   ACCU-CHEK AVIVA PLUS test strip USE TO TEST BLOOD SUGAR ONCE DAILY 100 strip 1   Accu-Chek Softclix Lancets lancets Use to check blood sugar once daily 100 each 2   acetaminophen (TYLENOL) 325 MG tablet Take 650 mg by mouth every 6 (six) hours as needed for mild pain, fever or headache.     allopurinol (ZYLOPRIM) 100 MG tablet TAKE 1 TABLET BY MOUTH TWICE A DAY 180 tablet 1   Alpha-Lipoic Acid 600 MG TABS Take 600 mg by mouth daily.     apixaban (ELIQUIS) 5 MG TABS tablet Take 1 tablet (5 mg total) by mouth 2 (two) times daily. 180 tablet 3   buPROPion ER (WELLBUTRIN SR) 100 MG 12 hr tablet Take 1 tablet (100 mg total) by mouth 2 (two) times daily. 180 tablet 1   calcium carbonate (CALCIUM 600) 600 MG TABS  tablet Take 600 mg by mouth daily.     DULoxetine (CYMBALTA) 60 MG capsule Take 1 capsule (60 mg total) by mouth daily. 90 capsule 1   ezetimibe (ZETIA) 10 MG tablet Take 1 tablet (10 mg total) by mouth daily. 30 tablet 0   Homeopathic Products (THERAWORX RELIEF EX) Apply 1 application topically daily as needed (foot cramps).     ketoconazole (NIZORAL) 2 % cream SMARTSIG:1 Application Topical 1 to 2 Times Daily     L-Lysine 500 MG TABS Take 1 tablet by mouth daily.     Lancet Devices (ACCU-CHEK SOFTCLIX) lancets Use to test blood sugar once a day. 100 each 2   LIVALO 2 MG TABS TAKE 1 TABLET BY MOUTH EVERY DAY 90 tablet 1   metroNIDAZOLE (METROCREAM) 0.75 % cream Apply topically 2 (two) times daily. Apply a thin layer twice a day to a clean dry face. 45 g 0   Multiple Vitamin (MULTIVITAMIN) capsule Take 1 capsule by mouth daily.      olmesartan (BENICAR) 20 MG tablet Take 1 tablet (20 mg total) by mouth daily. 90 tablet 2   pantoprazole (PROTONIX) 20 MG tablet Take 1 tablet (20 mg total) by mouth daily. 90 tablet 2   vitamin B-12 (CYANOCOBALAMIN) 500 MCG tablet Take 500 mcg by mouth daily.     No current facility-administered medications on file prior to visit.   Past Medical History:  Diagnosis Date  Chicken pox    Depression    Diabetes mellitus without complication (HCC)    GERD (gastroesophageal reflux disease)    Hyperlipidemia    Hypertension    Stroke [facility])    UTI (urinary tract infection)    No Known Allergies  Social History   Socioeconomic History   Marital status: Widowed    Spouse name: Not on file   Number of children: Not on file   Years of education: Not on file   Highest education level: Not on file  Occupational History   Occupation: N/A  Tobacco Use   Smoking status: Never   Smokeless tobacco: Never  Vaping Use   Vaping Use: Never used  Substance and Sexual Activity   Alcohol use: Yes    Comment: occassional wine   Drug use: No   Sexual activity: Not  Currently  Other Topics Concern   Not on file  Social History Narrative   Pt lives in single story home with her daughter   Has 2 children   Some college education   Retired Engineer, production for [employer]   Social Determinants of Health   Financial Resource Strain: [place]  (02/09/2021)   Overall Financial Resource Strain (CARDIA)    Difficulty of Paying Living Expenses: Not hard at all  Food Insecurity: No Food Insecurity (02/09/2021)   Hunger Vital Sign    Worried About Running Out of Food in the Last Year: Never true    [place] in the Last Year: Never true  Transportation Needs: No Transportation Needs (02/09/2021)   PRAPARE - Hydrologist (Medical): No    Lack of Transportation (Non-Medical): No  Physical Activity: Inactive (02/09/2021)   Exercise Vital Sign    Days of Exercise per Week: 0 days    Minutes of Exercise per Session: 0 min  Stress: No Stress Concern Present (02/09/2021)   [place]    Feeling of Stress : Not at all  Social Connections: Moderately Integrated (02/05/2020)   Social Connection and Isolation Panel [NHANES]    Frequency of Communication with Friends and Family: More than three times a week    Frequency of Social Gatherings with Friends and Family: Once a week    Attends Religious Services: More than 4 times per year    Active Member of [employer] or Organizations: Yes    Attends Archivist Meetings: 1 to 4 times per year    Marital Status: Widowed    Vitals:   09/03/21 0825  BP: 126/70  Pulse: 90  Temp: 98.3 F (36.8 C)  SpO2: 94%   Body mass index is 25.71 kg/m.  Physical Exam Vitals and nursing note reviewed.  Constitutional:      General: She is not in acute distress.    Appearance: She is well-developed.  HENT:     Head: Normocephalic and atraumatic.   Eyes:     Conjunctiva/sclera: Conjunctivae normal.   Cardiovascular:     Rate and Rhythm: Normal rate and regular rhythm.     Heart sounds: No murmur heard. Pulmonary:     Effort: Pulmonary effort is normal. No respiratory distress.     Breath sounds: Normal breath sounds.  Abdominal:     Palpations: Abdomen is soft.     Tenderness: There is no abdominal tenderness.  Musculoskeletal:     Right shoulder: Normal range of motion.     Left shoulder:  Normal range of motion.     Cervical back: No tenderness or bony tenderness. No pain with movement.     Left hip: Decreased range of motion (Mild internal rotation. Minimal pain elicited with full movement, no leg length discrepancy.).  Skin:    General: Skin is warm.     Findings: No erythema or rash.     Comments: Superficial excorations with Brown crust onleft foor toes (2ndto 5th), lateral ankle,and distal LLE. Ecchymosis around left hip.  Neurological:     General: No focal deficit present.     Mental Status: She is alert and oriented to person, place, and time.     Cranial Nerves: No cranial nerve deficit.     Comments: Unstable gait assisted by a cane.  Psychiatric:     Comments: Well groomed, good eye contact.   ASSESSMENT AND PLAN:   There are no diagnoses linked to this encounter.  No orders of the defined types were placed in this encounter.   No problem-specific Assessment & Plan notes found for this encounter.  I spent a total of 30 minutes in both face to face and non face to face activities for this visit on the date of this encounter. During this time history was obtained and documented, examination was performed, prior labs/imaging reviewed, and assessment/plan discussed.  No follow-ups on file.   Moani G. Martinique, MD  [facility]. [place] office.

## 2021-09-03 ENCOUNTER — Ambulatory Visit (INDEPENDENT_AMBULATORY_CARE_PROVIDER_SITE_OTHER): Payer: Medicare Other | Admitting: Family Medicine

## 2021-09-03 ENCOUNTER — Encounter: Payer: Self-pay | Admitting: Family Medicine

## 2021-09-03 VITALS — BP 126/70 | HR 90 | Temp 98.3°F | Resp 16 | Ht 67.0 in | Wt 164.1 lb

## 2021-09-03 DIAGNOSIS — R2681 Unsteadiness on feet: Secondary | ICD-10-CM | POA: Diagnosis not present

## 2021-09-03 DIAGNOSIS — S0101XD Laceration without foreign body of scalp, subsequent encounter: Secondary | ICD-10-CM | POA: Diagnosis not present

## 2021-09-03 DIAGNOSIS — W19XXXD Unspecified fall, subsequent encounter: Secondary | ICD-10-CM | POA: Diagnosis not present

## 2021-09-03 NOTE — Patient Instructions (Signed)
A few things to remember from today's visit:   Fall, subsequent encounter  Scalp laceration, subsequent encounter  If you need refills please call your pharmacy. Do not use My Chart to request refills or for acute issues that need immediate attention.   Keep areas clean with shampoo/soap and water. Monitor for signs of infection.  Please be sure medication list is accurate. If a new problem present, please set up appointment sooner than planned today.  Fall Prevention in the Home, Adult Falls can cause injuries and can happen to people of all ages. There are many things you can do to make your home safe and to help prevent falls. Ask for help when making these changes. What actions can I take to prevent falls? General Instructions Use good lighting in all rooms. Replace any light bulbs that burn out. Turn on the lights in dark areas. Use night-lights. Keep items that you use often in easy-to-reach places. Lower the shelves around your home if needed. Set up your furniture so you have a clear path. Avoid moving your furniture around. Do not have throw rugs or other things on the floor that can make you trip. Avoid walking on wet floors. If any of your floors are uneven, fix them. Add color or contrast paint or tape to clearly mark and help you see: Grab bars or handrails. First and last steps of staircases. Where the edge of each step is. If you use a stepladder: Make sure that it is fully opened. Do not climb a closed stepladder. Make sure the sides of the stepladder are locked in place. Ask someone to hold the stepladder while you use it. Know where your pets are when moving through your home. What can I do in the bathroom?     Keep the floor dry. Clean up any water on the floor right away. Remove soap buildup in the tub or shower. Use nonskid mats or decals on the floor of the tub or shower. Attach bath mats securely with double-sided, nonslip rug tape. If you need to  sit down in the shower, use a plastic, nonslip stool. Install grab bars by the toilet and in the tub and shower. Do not use towel bars as grab bars. What can I do in the bedroom? Make sure that you have a light by your bed that is easy to reach. Do not use any sheets or blankets for your bed that hang to the floor. Have a firm chair with side arms that you can use for support when you get dressed. What can I do in the kitchen? Clean up any spills right away. If you need to reach something above you, use a step stool with a grab bar. Keep electrical cords out of the way. Do not use floor polish or wax that makes floors slippery. What can I do with my stairs? Do not leave any items on the stairs. Make sure that you have a light switch at the top and the bottom of the stairs. Make sure that there are handrails on both sides of the stairs. Fix handrails that are broken or loose. Install nonslip stair treads on all your stairs. Avoid having throw rugs at the top or bottom of the stairs. Choose a carpet that does not hide the edge of the steps on the stairs. Check carpeting to make sure that it is firmly attached to the stairs. Fix carpet that is loose or worn. What can I do on the outside of my  home? Use bright outdoor lighting. Fix the edges of walkways and driveways and fix any cracks. Remove anything that might make you trip as you walk through a door, such as a raised step or threshold. Trim any bushes or trees on paths to your home. Check to see if handrails are loose or broken and that both sides of all steps have handrails. Install guardrails along the edges of any raised decks and porches. Clear paths of anything that can make you trip, such as tools or rocks. Have leaves, snow, or ice cleared regularly. Use sand or salt on paths during winter. Clean up any spills in your garage right away. This includes grease or oil spills. What other actions can I take? Wear shoes that: Have a  low heel. Do not wear high heels. Have rubber bottoms. Feel good on your feet and fit well. Are closed at the toe. Do not wear open-toe sandals. Use tools that help you move around if needed. These include: Canes. Walkers. Scooters. Crutches. Review your medicines with your doctor. Some medicines can make you feel dizzy. This can increase your chance of falling. Ask your doctor what else you can do to help prevent falls. Where to find more information Centers for Disease Control and Prevention, [employer]: http://www.wolf.info/ National Institute on Aging: http://kim-miller.com/ Contact a doctor if: You are afraid of falling at home. You feel weak, drowsy, or dizzy at home. You fall at home. Summary There are many simple things that you can do to make your home safe and to help prevent falls. Ways to make your home safe include removing things that can make you trip and installing grab bars in the bathroom. Ask for help when making these changes in your home. This information is not intended to replace advice given to you by your health care provider. Make sure you discuss any questions you have with your health care provider. Document Revised: 12/14/2020 Document Reviewed: 10/16/2019 Elsevier Patient Education  [place].

## 2021-09-10 ENCOUNTER — Telehealth: Payer: Self-pay | Admitting: Pharmacist

## 2021-09-10 NOTE — Chronic Care Management (AMB) (Unsigned)
Chronic Care Management Pharmacy Assistant   Name: Alexa Brown  MRN: [id number] DOB: 07/23/1940  Reason for Encounter: Disease State / Hypertension and Hyperlipidemia Assessment Call   Conditions to be addressed/monitored: HTN and HLD  Recent office visits:  09/03/2021 Alexa Martinique MD - Patient was seen for Fall, subsequent encounter and additional issues. No medication changes.  Return if symptoms worsen or fail to improve  06/29/2021 Alexa Martinique MD - Patient was seen for DM (diabetes mellitus), type 2 with neurological complications and additional issues. No medication changes. Patient stated Livalo 2 mg daily on 09/01/2021. Follow up in 6 months.   Recent consult visits:  08/17/2021 Alexa Brown DPM (podiatry) - Patient was seen for Pain due to onychomycosis of toenails of both feet and additional issues. No medication changes. Follow up in 3 months.   Hospital visits:  Patient was seen at [facility] ED on 08/26/2021 (1 hr) due to Scalp laceration, initial encounter.  New?Medications Started at [facility] Discharge:?? No medications started Medication Changes at Hospital Discharge: No medication changes Medications Discontinued at Hospital Discharge: No medication discontinued Medications that remain the same after Hospital Discharge:??  -All other medications will remain the same.     Admitted to the hospital on 05/28/2021 (1 hr) due to  Fall, initial encounter.    New?Medications Started at [facility] Discharge:?? No medications started Medication Changes at Hospital Discharge: No medications changed Medications Discontinued at Hospital Discharge: Colchicine (pt reports not taking) Medications that remain the same after Hospital Discharge:??  -All other medications will remain the same.    Medications: Outpatient Encounter Medications as of 09/10/2021  Medication Sig   ACCU-CHEK AVIVA PLUS test strip USE TO TEST BLOOD SUGAR ONCE DAILY    Accu-Chek Softclix Lancets lancets Use to check blood sugar once daily   acetaminophen (TYLENOL) 325 MG tablet Take 650 mg by mouth every 6 (six) hours as needed for mild pain, fever or headache.   allopurinol (ZYLOPRIM) 100 MG tablet TAKE 1 TABLET BY MOUTH TWICE A DAY   Alpha-Lipoic Acid 600 MG TABS Take 600 mg by mouth daily.   apixaban (ELIQUIS) 5 MG TABS tablet Take 1 tablet (5 mg total) by mouth 2 (two) times daily.   buPROPion ER (WELLBUTRIN SR) 100 MG 12 hr tablet Take 1 tablet (100 mg total) by mouth 2 (two) times daily.   calcium carbonate (CALCIUM 600) 600 MG TABS tablet Take 600 mg by mouth daily.   DULoxetine (CYMBALTA) 60 MG capsule Take 1 capsule (60 mg total) by mouth daily.   ezetimibe (ZETIA) 10 MG tablet Take 1 tablet (10 mg total) by mouth daily.   Homeopathic Products [facility] RELIEF EX) Apply 1 application topically daily as needed (foot cramps).   ketoconazole (NIZORAL) 2 % cream SMARTSIG:1 Application Topical 1 to 2 Times Daily   L-Lysine 500 MG TABS Take 1 tablet by mouth daily.   Lancet Devices (ACCU-CHEK SOFTCLIX) lancets Use to test blood sugar once a day.   LIVALO 2 MG TABS TAKE 1 TABLET BY MOUTH EVERY DAY   metroNIDAZOLE (METROCREAM) 0.75 % cream Apply topically 2 (two) times daily. Apply a thin layer twice a day to a clean dry face.   Multiple Vitamin (MULTIVITAMIN) capsule Take 1 capsule by mouth daily.    olmesartan (BENICAR) 20 MG tablet Take 1 tablet (20 mg total) by mouth daily.   pantoprazole (PROTONIX) 20 MG tablet Take 1 tablet (20 mg total) by mouth daily.   vitamin B-12 (CYANOCOBALAMIN)  500 MCG tablet Take 500 mcg by mouth daily.   No facility-administered encounter medications on file as of 09/10/2021.  Fill History: ALLOPURINOL 100 MG TABLET 07/07/2021 90   ELIQUIS 5 MG TABLET 07/05/2021 90   BUPROPION HCL SR 100 MG TABLET 08/29/2021 30   DULOXETINE HCL DR 60 MG CAP 08/29/2021 30   EZETIMIBE 10 MG TABLET 06/13/2021 90   PANTOPRAZOLE SOD DR 20  MG TAB 07/05/2021 90   07/19/2021 90   LIVALO 2 MG TABLET 07/06/2021 90  Reviewed chart prior to disease state call. Spoke with patient regarding BP  Recent Office Vitals: BP Readings from Last 3 Encounters:  09/03/21 126/70  08/26/21 (!) 139/50  06/29/21 120/78   Pulse Readings from Last 3 Encounters:  09/03/21 90  08/26/21 81  06/29/21 95    Wt Readings from Last 3 Encounters:  09/03/21 164 lb 2 oz (74.4 kg)  08/26/21 161 lb (73 kg)  06/29/21 163 lb 8 oz (74.2 kg)     Kidney Function Lab Results  Component Value Date/Time   CREATININE 1.08 06/29/2021 09:23 AM   CREATININE 1.01 01/19/2021 03:30 PM   CREATININE 1.00 (H) 02/11/2020 09:05 AM   CREATININE 0.92 07/21/2017 04:02 PM   GFR 49.02 (L) 06/29/2021 09:23 AM   GFRNONAA >60 07/27/2020 04:35 AM   GFRNONAA 54 (L) 02/11/2020 09:05 AM   GFRAA 63 02/11/2020 09:05 AM       Latest Ref Rng & Units 06/29/2021    9:23 AM 01/19/2021    3:30 PM 07/30/2020   11:58 AM  BMP  Glucose 70 - 99 mg/dL 109  104  107   BUN 6 - 23 mg/dL '16  17  26   '$ Creatinine 0.40 - 1.20 mg/dL 1.08  1.01  1.07   Sodium 135 - 145 mEq/L 142  137  137   Potassium 3.5 - 5.1 mEq/L 4.3  4.5  4.6   Chloride 96 - 112 mEq/L 107  104  105   CO2 19 - 32 mEq/L '25  25  24   '$ Calcium 8.4 - 10.5 mg/dL 10.5  10.2  10.5       Lipid Panel    Component Value Date/Time   CHOL 211 (H) 06/29/2021 0923   TRIG 90.0 06/29/2021 0923   HDL 70.30 06/29/2021 0923   LDLCALC 122 (H) 06/29/2021 0923   LDLCALC 164 (H) 02/11/2020 0905    10-year ASCVD risk score: The ASCVD Risk score (Arnett DK, et al., 2019) failed to calculate for the following reasons:   The patient has a prior MI or stroke diagnosis  Current antihypertensive regimen:  Olmesartan 20 mg daily  How often are you checking your Blood Pressure? {CHL HP BP Monitoring [phone]}  Current home BP readings: ***  What recent interventions/DTPs have been made by any provider to improve Blood  Pressure control since last CPP Visit: No recent interventions  Any recent hospitalizations or ED visits since last visit with CPP? Patient had recent ED visits, 08/26/2021 and 05/28/2021.  Adherence Review: Is the patient currently on ACE/ARB medication? Yes Does the patient have >5 day gap between last estimated fill dates? No  Current antihyperlipidemic regimen:  Livalo 2 mg daily Zetia 10 mg daily  Previous antihyperlipidemic medications tried: Atorvastatin and Pravastatin  ASCVD risk enhancing conditions: age >28, DM, HTN, and CKD  What recent interventions/DTPs have been made by any provider to improve Cholesterol control since last CPP Visit: Patient recently started Livalo  What diet changes  have been made to improve Cholesterol?  Patient follows a mediterranean diet Breakfast - patient will have Lunch - patient will have Dinner - patient will have  What exercise is being done to improve Cholesterol?  ***  Adherence Review: Does the patient have >5 day gap between last estimated fill dates? No   Care Gaps: AWV - scheduled 02/15/2022 Last BP - 126/70 on 09/03/2021 Last A1C - 6.1 on 06/29/2021 Covid booster - overdue Pneumonia vaccine - postponed  Star Rating Drugs: Livalo 2 mg - last filled 07/06/2021 90 DS at [facility] Olmesartan 20 mg - last filled 07/19/2021 90 DS at [place] [facility] Pharmacist Assistant [phone]

## 2021-09-18 ENCOUNTER — Encounter (HOSPITAL_COMMUNITY): Payer: Self-pay

## 2021-09-18 ENCOUNTER — Emergency Department (HOSPITAL_COMMUNITY): Payer: Medicare Other

## 2021-09-18 ENCOUNTER — Emergency Department (HOSPITAL_COMMUNITY)
Admission: EM | Admit: 2021-09-18 | Discharge: 2021-09-18 | Disposition: A | Discharge status: Home / Self Care | Payer: Medicare Other | Attending: Emergency Medicine | Admitting: Emergency Medicine

## 2021-09-18 ENCOUNTER — Other Ambulatory Visit: Payer: Self-pay

## 2021-09-18 DIAGNOSIS — S53104A Unspecified dislocation of right ulnohumeral joint, initial encounter: Secondary | ICD-10-CM | POA: Diagnosis not present

## 2021-09-18 DIAGNOSIS — W010XXA Fall on same level from slipping, tripping and stumbling without subsequent striking against object, initial encounter: Secondary | ICD-10-CM | POA: Insufficient documentation

## 2021-09-18 DIAGNOSIS — Z743 Need for continuous supervision: Secondary | ICD-10-CM | POA: Diagnosis not present

## 2021-09-18 DIAGNOSIS — M7989 Other specified soft tissue disorders: Secondary | ICD-10-CM | POA: Diagnosis not present

## 2021-09-18 DIAGNOSIS — S59901A Unspecified injury of right elbow, initial encounter: Secondary | ICD-10-CM | POA: Diagnosis present

## 2021-09-18 DIAGNOSIS — S0083XA Contusion of other part of head, initial encounter: Secondary | ICD-10-CM | POA: Diagnosis not present

## 2021-09-18 DIAGNOSIS — T148XXA Other injury of unspecified body region, initial encounter: Secondary | ICD-10-CM

## 2021-09-18 DIAGNOSIS — G319 Degenerative disease of nervous system, unspecified: Secondary | ICD-10-CM | POA: Diagnosis not present

## 2021-09-18 DIAGNOSIS — Z7901 Long term (current) use of anticoagulants: Secondary | ICD-10-CM

## 2021-09-18 DIAGNOSIS — W19XXXA Unspecified fall, initial encounter: Secondary | ICD-10-CM | POA: Diagnosis not present

## 2021-09-18 DIAGNOSIS — M79603 Pain in arm, unspecified: Secondary | ICD-10-CM | POA: Diagnosis not present

## 2021-09-18 DIAGNOSIS — S0990XA Unspecified injury of head, initial encounter: Secondary | ICD-10-CM | POA: Diagnosis not present

## 2021-09-18 MED ORDER — HYDROMORPHONE HCL 1 MG/ML IJ SOLN
0.5000 mg | Freq: Once | INTRAMUSCULAR | Status: AC
  Administered 2021-09-18: 0.5 mg via INTRAVENOUS
  Filled 2021-09-18: qty 1

## 2021-09-18 MED ORDER — PROPOFOL 10 MG/ML IV BOLUS
INTRAVENOUS | Status: AC
  Filled 2021-09-18: qty 20

## 2021-09-18 MED ORDER — PROPOFOL 10 MG/ML IV BOLUS
INTRAVENOUS | Status: AC | PRN
  Administered 2021-09-18: 40 mg via INTRAVENOUS

## 2021-09-18 MED ORDER — SODIUM CHLORIDE 0.9 % IV SOLN
INTRAVENOUS | Status: AC | PRN
  Administered 2021-09-18: 1000 mL via INTRAVENOUS

## 2021-09-18 MED ORDER — TRAMADOL HCL 50 MG PO TABS: 50.0000 mg | ORAL_TABLET | Freq: Three times a day (TID) | ORAL | 0 refills | Status: DC | PRN

## 2021-09-18 MED ORDER — PROPOFOL 10 MG/ML IV BOLUS
0.5000 mg/kg | Freq: Once | INTRAVENOUS | Status: AC
  Administered 2021-09-18: 36.5 mg via INTRAVENOUS
  Filled 2021-09-18: qty 20

## 2021-09-18 NOTE — ED Notes (Signed)
Pt ambulated w/ stand by assist to the bathroom

## 2021-09-18 NOTE — Sedation Documentation (Signed)
Ortho tech and Steinl MD splinting R elbow

## 2021-09-18 NOTE — Sedation Documentation (Signed)
Patient denies pain and is resting comfortably.  

## 2021-09-18 NOTE — ED Notes (Signed)
Pt back from CT

## 2021-09-21 ENCOUNTER — Telehealth: Payer: Self-pay

## 2021-09-21 NOTE — Telephone Encounter (Signed)
Voicemail on the triage phone Patients daughter Selena Batten called stating she is worried patients splint is on too tight, states her fingers are really swollen She wants a call back [phone]

## 2021-09-22 ENCOUNTER — Telehealth: Payer: Self-pay

## 2021-09-22 NOTE — Telephone Encounter (Signed)
Patient's daughter Maudie Mercury called stating that she noticed that patient has a huge blister on her right elbow that is filled with blood.  Would like a call back?  CB# [phone].  Please advise.  Thank you.

## 2021-09-23 ENCOUNTER — Ambulatory Visit (INDEPENDENT_AMBULATORY_CARE_PROVIDER_SITE_OTHER): Payer: Medicare Other

## 2021-09-23 ENCOUNTER — Ambulatory Visit (INDEPENDENT_AMBULATORY_CARE_PROVIDER_SITE_OTHER): Payer: Medicare Other | Admitting: Orthopedic Surgery

## 2021-09-23 DIAGNOSIS — S53104A Unspecified dislocation of right ulnohumeral joint, initial encounter: Secondary | ICD-10-CM

## 2021-09-23 NOTE — Progress Notes (Signed)
Office Visit Note   Patient: Alexa Brown           Date of Birth: Aug 21, 1940           MRN: [id number] Visit Date: 09/23/2021              Requested by: Martinique, Zlaty G, MD [address] [place],  [place] [address] PCP: Martinique, Jaymie G, MD   Assessment & Plan: Visit Diagnoses:  1. Dislocation of right elbow, initial encounter     Plan: Initially I got repeat films in her ER splint which showed a concentrically reduced elbow.  After talking with patient and her daughter, however, I was concerned about the nature of the splint.  The splint was very tight around the upper arm and elbow.  She had a large medial serous filled blister that is either from the splint being too tight or her injury.  The daughter notes that her fingers were turning purple at 1 point during the week secondary to splint tightness.  She was taken out of the splint and it was replaced with a new, well-padded long-arm splint.  Repeat x-rays show continued concentric reduction.  I would like to get a CT to further evaluate the nature of the reduction and assess for any occult fracture.  I can see her back in a week or so.  Follow-Up Instructions: No follow-ups on file.   Orders:  Orders Placed This Encounter  Procedures   XR Elbow 2 Views Right   No orders of the defined types were placed in this encounter.     Procedures: No procedures performed   Clinical Data: No additional findings.   Subjective: Chief Complaint  Patient presents with   Right Elbow - Follow-up, Dislocation    DOI: 09/18/21    This is a 81 year old right-hand-dominant female.  Presents with ER follow-up from a right elbow dislocation.  Patient with ground-level fall on Saturday.  She is seen in the ER where the elbow was reduced.  It was she placed a long-arm splint.  During the course of her x-rays, the elbow redislocated.  Her daughter notes that her arm was pushed and pulled significantly during the x-ray.  She was  eventually reduced again and placed in a long-arm splint.  Daughter notes that the splint was very tight earlier this week and her fingers were turning purple.  The Ace wrap was loosened.  She does note that she has a large serous fluid-filled blister at the medial aspect of the elbow.  Her pain is otherwise well controlled.  She has not noted any subjective instability while in the splint.  She has normal sensation in her hand without any numbness or paresthesias.    Review of Systems   Objective: Vital Signs: There were no vitals taken for this visit.  Physical Exam Constitutional:      Appearance: Normal appearance.  Cardiovascular:     Rate and Rhythm: Normal rate.     Pulses: Normal pulses.  Pulmonary:     Effort: Pulmonary effort is normal.  Skin:    General: Skin is warm and dry.     Capillary Refill: Capillary refill takes less than 2 seconds.  Neurological:     Mental Status: She is alert.     Right Elbow Exam   Other  Erythema: absent Sensation: normal Pulse: present  Comments:  Ecchymosis at the medial aspect of the elbow with large, serous fluid filled blister.  Mild to moderate  elbow swelling.       Specialty Comments:  No specialty comments available.  Imaging: No results found.   PMFS History: Patient Active Problem List   Diagnosis Date Noted   Atherosclerosis of aorta ([place]) 01/19/2021   (HFpEF) heart failure with preserved ejection fraction ([place]) 07/30/2020   Hx of pulmonary embolus 07/24/2020   Diplopia 04/09/2020   Epiretinal membrane (ERM) of left eye 04/09/2020   Pseudophakia of both eyes 04/09/2020   Myalgia due to statin 02/11/2020   Posterior capsular opacification of both eyes, obscuring vision 10/17/2019   Elbow dislocation 07/02/2019   Generalized osteoarthritis of multiple sites 06/26/2018   Incontinent of urine 04/28/2018   Gout, arthropathy 01/22/2018   Lower back pain 09/22/2017   Unstable gait 09/22/2017   Seborrheic  dermatitis 01/23/2017   Seborrheic keratosis 09/27/2016   Falling asleep and waking up too early in the evening 08/19/2016   CKD (chronic kidney disease), stage III ([place]) 05/28/2016   GERD (gastroesophageal reflux disease) 02/25/2016   CVA (cerebral vascular accident) (HCC)-No residual deficit 02/12/2016   Glaucoma 02/12/2016   Depression, major, recurrent ([place]) 02/12/2016   Hyperlipidemia associated with type 2 diabetes mellitus ([place]) 02/12/2016   Vitamin D deficiency 01/14/2016   DM (diabetes mellitus), type 2 with neurological complications ([place]) 03/88/8280   Systolic hypertension with cerebrovascular disease 01/14/2016   Peripheral neuropathy 01/14/2016   Acne rosacea 01/28/2013   Thyroid activity decreased 03/28/2009   Past Medical History:  Diagnosis Date   Chicken pox    Depression    Diabetes mellitus without complication ([place])    GERD (gastroesophageal reflux disease)    Hyperlipidemia    Hypertension    Stroke ([place])    UTI (urinary tract infection)     Family History  Problem Relation Age of Onset   Hyperlipidemia Mother    Heart disease Mother    Stroke Mother    Hypertension Mother    Hyperlipidemia Father    Heart disease Father    Stroke Father    Hypertension Father    Asthma Father    Parkinson's disease Sister     Past Surgical History:  Procedure Laterality Date   ABDOMINAL HYSTERECTOMY     APPENDECTOMY     BREAST BIOPSY Left 2014   BREAST SURGERY     biopsy   Social History   Occupational History   Occupation: N/A  Tobacco Use   Smoking status: Never   Smokeless tobacco: Never  Vaping Use   Vaping Use: Never used  Substance and Sexual Activity   Alcohol use: Yes    Comment: occassional wine   Drug use: No   Sexual activity: Not Currently

## 2021-09-24 ENCOUNTER — Telehealth: Payer: Self-pay

## 2021-09-24 NOTE — Telephone Encounter (Signed)
Pt's daughter kim called and is concerned about her mom. She stated her had is swelling and is black and blue that started last night and seems to be getting worse.Marland Kitchen Has been trying to elevate it but doesn't seem to help. Please call her back to discuss @ [phone]

## 2021-09-29 ENCOUNTER — Ambulatory Visit: Payer: Medicare Other | Admitting: Family Medicine

## 2021-10-01 ENCOUNTER — Telehealth: Payer: Self-pay | Admitting: Orthopedic Surgery

## 2021-10-01 ENCOUNTER — Other Ambulatory Visit: Payer: Self-pay | Admitting: Radiology

## 2021-10-01 DIAGNOSIS — S42401A Unspecified fracture of lower end of right humerus, initial encounter for closed fracture: Secondary | ICD-10-CM

## 2021-10-01 DIAGNOSIS — S53104A Unspecified dislocation of right ulnohumeral joint, initial encounter: Secondary | ICD-10-CM

## 2021-10-01 NOTE — Telephone Encounter (Signed)
Please advise if this was ordered -   I would like to get a CT to further evaluate the nature of the reduction and assess for any occult fracture.  I can see her back in a week or so.

## 2021-10-05 ENCOUNTER — Ambulatory Visit
Admission: RE | Admit: 2021-10-05 | Discharge: 2021-10-05 | Disposition: A | Discharge status: Home / Self Care | Payer: Medicare Other | Source: Ambulatory Visit | Attending: Orthopedic Surgery | Admitting: Orthopedic Surgery

## 2021-10-05 DIAGNOSIS — M25421 Effusion, right elbow: Secondary | ICD-10-CM | POA: Diagnosis not present

## 2021-10-05 DIAGNOSIS — S42451A Displaced fracture of lateral condyle of right humerus, initial encounter for closed fracture: Secondary | ICD-10-CM | POA: Diagnosis not present

## 2021-10-05 DIAGNOSIS — S42401A Unspecified fracture of lower end of right humerus, initial encounter for closed fracture: Secondary | ICD-10-CM

## 2021-10-05 DIAGNOSIS — S53104A Unspecified dislocation of right ulnohumeral joint, initial encounter: Secondary | ICD-10-CM

## 2021-10-08 ENCOUNTER — Ambulatory Visit (INDEPENDENT_AMBULATORY_CARE_PROVIDER_SITE_OTHER): Payer: Medicare Other | Admitting: Orthopedic Surgery

## 2021-10-08 DIAGNOSIS — S53104A Unspecified dislocation of right ulnohumeral joint, initial encounter: Secondary | ICD-10-CM | POA: Diagnosis not present

## 2021-10-08 NOTE — Progress Notes (Signed)
Office Visit Note   Patient: Alexa Brown           Date of Birth: 1940-06-03           MRN: [id number] Visit Date: 10/08/2021              Requested by: Martinique, Lucindia G, MD [address] [place],  [place] [address] PCP: Martinique, Moira G, MD   Assessment & Plan: Visit Diagnoses:  1. Elbow dislocation, right, initial encounter     Plan: Patient is now 81 years out from a right elbow dislocation that was reduced in the ER.  Recent CT demonstrated a concentric reduction with some very small fracture fragments from the capitellum likely secondary to impaction from the dislocation.  There is no fracture of the coronoid or radial head.  The splint was removed today.  She has a healing blister at the medial aspect of the elbow that seems to be from a significantly tight splint from the ER.  She has some predictable stiffness of the elbow but no subjective instability with passive or active range of motion.  We discussed working on active range of motion at home but no lifting or carrying objects with his elbow just yet.  If she feels any subjective instability at home then she will contact the office and we will transition her to a hinged brace.  I can see her back in a few weeks.  Follow-Up Instructions: No follow-ups on file.   Orders:  No orders of the defined types were placed in this encounter.  No orders of the defined types were placed in this encounter.     Procedures: No procedures performed   Clinical Data: No additional findings.   Subjective: Chief Complaint  Patient presents with   Right Elbow - Follow-up, Dislocation    Is a 81 year old right-hand-dominant female presents for follow-up of a right elbow dislocation.  She had a ground-level fall about 2 weeks ago and was seen in the ER.  The elbow was reduced and placed in a long-arm splint.  The course of her x-rays elbow redislocated and was really reduced.  She was seen in my office at which time she  was found to have a very tight splint.  She had a large serous fluid filled blister at the medial aspect of the elbow.  That splint was replaced.  She has been doing well since that visit.  Her swelling is almost completely resolved at this point.  She has no pain in the elbow now.  She is overall without complaint.    Review of Systems   Objective: Vital Signs: There were no vitals taken for this visit.  Physical Exam  Right Elbow Exam   Tenderness  Right elbow tenderness location: Mild medial and lateral tenderness.   Other  Erythema: absent Sensation: normal Pulse: present  Comments:  Healing blister at medial aspect of elbow.  Swelling has resolved.  No subjective instability w/ A/PROM of elbow.  Full pronation and supination.       Specialty Comments:  No specialty comments available.  Imaging: No results found.   PMFS History: Patient Active Problem List   Diagnosis Date Noted   Atherosclerosis of aorta ([place]) 01/19/2021   (HFpEF) heart failure with preserved ejection fraction ([place]) 07/30/2020   Hx of pulmonary embolus 07/24/2020   Diplopia 04/09/2020   Epiretinal membrane (ERM) of left eye 04/09/2020   Pseudophakia of both eyes 04/09/2020   Myalgia due to  statin 02/11/2020   Posterior capsular opacification of both eyes, obscuring vision 10/17/2019   Elbow dislocation, right, initial encounter 07/02/2019   Generalized osteoarthritis of multiple sites 06/26/2018   Incontinent of urine 04/28/2018   Gout, arthropathy 01/22/2018   Lower back pain 09/22/2017   Unstable gait 09/22/2017   Seborrheic dermatitis 01/23/2017   Seborrheic keratosis 09/27/2016   Falling asleep and waking up too early in the evening 08/19/2016   CKD (chronic kidney disease), stage III ([place]) 05/28/2016   GERD (gastroesophageal reflux disease) 02/25/2016   CVA (cerebral vascular accident) (HCC)-No residual deficit 02/12/2016   Glaucoma 02/12/2016   Depression, major, recurrent ([place])  02/12/2016   Hyperlipidemia associated with type 2 diabetes mellitus ([place]) 02/12/2016   Vitamin D deficiency 01/14/2016   DM (diabetes mellitus), type 2 with neurological complications ([place]) 73/42/8768   Systolic hypertension with cerebrovascular disease 01/14/2016   Peripheral neuropathy 01/14/2016   Acne rosacea 01/28/2013   Thyroid activity decreased 03/28/2009   Past Medical History:  Diagnosis Date   Chicken pox    Depression    Diabetes mellitus without complication ([place])    GERD (gastroesophageal reflux disease)    Hyperlipidemia    Hypertension    Stroke ([place])    UTI (urinary tract infection)     Family History  Problem Relation Age of Onset   Hyperlipidemia Mother    Heart disease Mother    Stroke Mother    Hypertension Mother    Hyperlipidemia Father    Heart disease Father    Stroke Father    Hypertension Father    Asthma Father    Parkinson's disease Sister     Past Surgical History:  Procedure Laterality Date   ABDOMINAL HYSTERECTOMY     APPENDECTOMY     BREAST BIOPSY Left 2014   BREAST SURGERY     biopsy   Social History   Occupational History   Occupation: N/A  Tobacco Use   Smoking status: Never   Smokeless tobacco: Never  Vaping Use   Vaping Use: Never used  Substance and Sexual Activity   Alcohol use: Yes    Comment: occassional wine   Drug use: No   Sexual activity: Not Currently

## 2021-10-18 ENCOUNTER — Telehealth: Payer: Self-pay | Admitting: Pharmacist

## 2021-10-18 MED ORDER — ACCU-CHEK GUIDE W/DEVICE KIT: PACK | 0 refills | Status: AC

## 2021-10-18 MED ORDER — ACCU-CHEK SOFTCLIX LANCETS MISC: 12 refills | Status: AC

## 2021-10-18 MED ORDER — ACCU-CHEK GUIDE VI STRP: ORAL_STRIP | 12 refills | Status: AC

## 2021-10-18 NOTE — Telephone Encounter (Signed)
Accu-Chek Aviva has been discontinued and replaced with Accu-Chek Guide. New meter, strips & lancets sent in for patient. Maddie will let her know about the change.

## 2021-10-18 NOTE — Telephone Encounter (Signed)
Patient called as she is out of her accu-chek testing supplies and she needs [facility] l[facility]ets [facility] [address]. She confirmed she is still using accu-chek aviva plus strips and accu-chek softclix lancets.

## 2021-10-25 DIAGNOSIS — Z08 Encounter for follow-up examination after completed treatment for malignant neoplasm: Secondary | ICD-10-CM | POA: Diagnosis not present

## 2021-10-25 DIAGNOSIS — L570 Actinic keratosis: Secondary | ICD-10-CM | POA: Diagnosis not present

## 2021-10-25 DIAGNOSIS — Z86007 Personal history of in-situ neoplasm of skin: Secondary | ICD-10-CM | POA: Diagnosis not present

## 2021-10-25 DIAGNOSIS — L821 Other seborrheic keratosis: Secondary | ICD-10-CM | POA: Diagnosis not present

## 2021-10-25 DIAGNOSIS — L814 Other melanin hyperpigmentation: Secondary | ICD-10-CM | POA: Diagnosis not present

## 2021-10-25 DIAGNOSIS — D1801 Hemangioma of skin and subcutaneous tissue: Secondary | ICD-10-CM | POA: Diagnosis not present

## 2021-11-02 ENCOUNTER — Other Ambulatory Visit: Payer: Self-pay | Admitting: Family Medicine

## 2021-11-02 DIAGNOSIS — Z1231 Encounter for screening mammogram for malignant neoplasm of breast: Secondary | ICD-10-CM

## 2021-11-17 ENCOUNTER — Ambulatory Visit
Admission: RE | Admit: 2021-11-17 | Discharge: 2021-11-17 | Disposition: A | Discharge status: Home / Self Care | Payer: Medicare Other | Source: Ambulatory Visit | Attending: Family Medicine | Admitting: Family Medicine

## 2021-11-17 DIAGNOSIS — Z1231 Encounter for screening mammogram for malignant neoplasm of breast: Secondary | ICD-10-CM

## 2021-11-19 ENCOUNTER — Other Ambulatory Visit: Payer: Self-pay | Admitting: Family Medicine

## 2021-11-19 DIAGNOSIS — R928 Other abnormal and inconclusive findings on diagnostic imaging of breast: Secondary | ICD-10-CM

## 2021-11-23 ENCOUNTER — Ambulatory Visit: Payer: Medicare Other | Admitting: Podiatry

## 2021-11-23 ENCOUNTER — Other Ambulatory Visit: Payer: Self-pay | Admitting: Family Medicine

## 2021-11-23 ENCOUNTER — Telehealth: Payer: Self-pay | Admitting: Pharmacist

## 2021-11-23 DIAGNOSIS — G629 Polyneuropathy, unspecified: Secondary | ICD-10-CM

## 2021-11-23 DIAGNOSIS — F331 Major depressive disorder, recurrent, moderate: Secondary | ICD-10-CM

## 2021-11-23 NOTE — Progress Notes (Deleted)
Chronic Care Management Pharmacy Note  11/23/2021 Name:  Alexa Brown MRN:  [id number] DOB:  December 12, 1940  Summary: BP is at goal of < 140/90 at home LDL is not at goal < 70  Recommendations/Changes made from today's visit: -Recommend repeat lipid panel and uric acid level -Offered grief counseling from CCM Education officer, museum and pt declined -Recommend referral to psychiatrist  Plan: BP assessment in 3 months Follow up in 6 months  Subjective: Alexa Brown is an 81 y.o. year old female who is a primary patient of Brown, Malka So, MD.  The CCM team was consulted for assistance with disease management and care coordination needs.    Engaged with patient by telephone for follow up visit in response to provider referral for pharmacy case management and/or care coordination services.   Consent to Services:  The patient was given information about Chronic Care Management services, agreed to services, and gave verbal consent prior to initiation of services.  Please see initial visit note for detailed documentation.   Patient Care Team: Brown, Vincentina G, MD as PCP - General (Family Medicine) Alexa Brown, [facility] as Pharmacist (Pharmacist)  Recent office visits: 09/03/2021 Alexa Martinique MD - Patient was seen for Fall, subsequent encounter and additional issues. No medication changes.  Return if symptoms worsen or fail to improve   06/29/2021 Alexa Martinique MD - Patient was seen for DM (diabetes mellitus), type 2 with neurological complications and additional issues. No medication changes. Patient stated Livalo 2 mg daily on 09/01/2021. Follow up in 6 months.     Recent consult visits: 10/08/21 Alexa Cooter, MD (ortho): Patient presented for right elbow dislocation.  Follow up in a few weeks.  10/08/21 Alexa Cooter, MD (ortho): Patient presented for right elbow dislocation initial visit.  08/17/2021 Alexa Brown DPM (podiatry) - Patient was seen for Pain due to  onychomycosis of toenails of both feet and additional issues. No medication changes. Follow up in 3 months.   05/18/21 Alexa Brown, DPM (podiatry): Patient presented for nail debridement.  05/05/21 Alexa Snare, MD (vasc surgery): Patient presented for initial visit for arthalgia of lower legs.  Hospital visits: Medication Reconciliation was completed by comparing discharge summary, patient's EMR and Pharmacy list, and upon discussion with patient.   Presented to the [facility] ED on 09/18/2021 due to fall. Present for 7 hours.   New?Medications Started at ED Discharge:?? -None   Medication Changes at ED Discharge: -None    Medications Discontinued at ED Discharge: -None   Medications that remain the same after ED Discharge:?? -All other medications will remain the same.    Patient was seen at [facility] ED on 08/26/2021 (1 hr) due to Scalp laceration, initial encounter.  New?Medications Started at [facility] Discharge:?? No medications started Medication Changes at Hospital Discharge: No medication changes Medications Discontinued at Hospital Discharge: No medication discontinued Medications that remain the same after Hospital Discharge:??  -All other medications will remain the same.       Admitted to the hospital on 05/28/2021 (1 hr) due to  Fall, initial encounter.    New?Medications Started at [facility] Discharge:?? No medications started Medication Changes at Hospital Discharge: No medications changed Medications Discontinued at Hospital Discharge: [place] (pt reports not taking) Medications that remain the same after Hospital Discharge:??  -All other medications will remain the same.   Objective:  Lab Results  Component Value Date   CREATININE 1.08 06/29/2021   BUN 16 06/29/2021   GFR 49.02 (L)  06/29/2021   GFRNONAA >60 07/27/2020   GFRAA 63 02/11/2020   NA 142 06/29/2021   K 4.3 06/29/2021   CALCIUM 10.5 06/29/2021   CO2 25  06/29/2021   GLUCOSE 109 (H) 06/29/2021    Lab Results  Component Value Date/Time   HGBA1C 6.1 03/31/2021 10:05 AM   HGBA1C 5.5 11/02/2020 08:56 AM   HGBA1C 6.2 07/14/2020 09:17 AM   HGBA1C 6.0 (H) 02/11/2020 09:05 AM   HGBA1C 5.7 07/27/2015 12:00 AM   HGBA1C 5.9 03/27/2015 12:00 AM   FRUCTOSAMINE 253 12/18/2018 09:23 AM   GFR 49.02 (L) 06/29/2021 09:23 AM   GFR 53.29 (L) 01/19/2021 03:30 PM   MICROALBUR <0.7 06/29/2021 09:23 AM   MICROALBUR 1.1 07/14/2020 09:17 AM    Last diabetic Eye exam: No results found for: "HMDIABEYEEXA"  Last diabetic Foot exam: No results found for: "HMDIABFOOTEX"   Lab Results  Component Value Date   CHOL 211 (H) 06/29/2021   HDL 70.30 06/29/2021   LDLCALC 122 (H) 06/29/2021   TRIG 90.0 06/29/2021   CHOLHDL 3 06/29/2021       Latest Ref Rng & Units 07/27/2020    4:35 AM 07/26/2020    2:57 AM 07/25/2020    2:46 AM  Hepatic Function  Total Protein 6.5 - 8.1 Brown/dL   6.4   Albumin 3.5 - 5.0 Brown/dL 3.4  3.6  3.4   AST 15 - 41 U/L   18   ALT 0 - 44 U/L   14   Alk Phosphatase 38 - 126 U/L   45   Total Bilirubin 0.3 - 1.2 mg/dL   0.2     Lab Results  Component Value Date/Time   TSH 2.87 12/18/2018 09:23 AM   TSH 1.41 10/30/2017 12:00 AM       Latest Ref Rng & Units 07/30/2020   11:58 AM 07/27/2020    4:35 AM 07/26/2020    2:57 AM  CBC  WBC 4.0 - 10.5 K/uL 5.3  5.2  6.4   Hemoglobin 12.0 - 15.0 Brown/dL 14.4  13.6  14.0   Hematocrit 36.0 - 46.0 % 42.8  41.1  42.0   Platelets 150.0 - 400.0 K/uL 253.0  223  219     Lab Results  Component Value Date/Time   VD25OH 67 07/21/2017 04:02 PM   VD25OH 43.34 01/23/2017 01:08 PM   VD25OH 54.05 01/14/2016 03:49 PM    Clinical ASCVD: Yes  The ASCVD Risk score (Arnett DK, et al., 2019) failed to calculate for the following reasons:   The patient has a prior MI or stroke diagnosis       09/03/2021    8:38 AM 06/29/2021    8:54 AM 03/31/2021   10:04 AM  Depression screen PHQ 2/9  Decreased Interest _0 Down, Depressed, Hopeless _1 PHQ - 2 Score _2 Altered sleeping _3 Tired, decreased energy _4 Change in appetite 0 2 2  Feeling bad or failure about yourself  _5 Trouble concentrating 0 0 1  Moving slowly or fidgety/restless 0 0 1  Suicidal thoughts 0 0 1  PHQ-9 Score _6 Difficult doing work/chores Somewhat difficult Somewhat difficult Somewhat difficult      Social History   Tobacco Use  Smoking Status Never  Smokeless Tobacco Never   BP Readings from Last 3 Encounters:  09/18/21 (!) 148/81  09/03/21 126/70  08/26/21 Marland Kitchen)  139/50   Pulse Readings from Last 3 Encounters:  09/18/21 87  09/03/21 90  08/26/21 81   Wt Readings from Last 3 Encounters:  09/18/21 161 lb (73 kg)  09/03/21 164 lb 2 oz (74.4 kg)  08/26/21 161 lb (73 kg)   BMI Readings from Last 3 Encounters:  09/18/21 25.22 kg/m  09/03/21 25.71 kg/m  08/26/21 25.22 kg/m    Assessment/Interventions: Review of patient past medical history, allergies, medications, health status, including review of consultants reports, laboratory and other test data, was performed as part of comprehensive evaluation and provision of chronic care management services.   SDOH:  (Social Determinants of Health) assessments and interventions performed: No  SDOH Screenings   Alcohol Screen: Not on file  Depression (PHQ2-9): Medium Risk (09/03/2021)   Depression (PHQ2-9)    PHQ-2 Score: 9  Financial Resource Strain: Low Risk  (02/09/2021)   Overall Financial Resource Strain (CARDIA)    Difficulty of Paying Living Expenses: Not hard at all  Food Insecurity: No Food Insecurity (02/09/2021)   Hunger Vital Sign    Worried About Running Out of Food in the Last Year: Never true    Ran Out of Food in the Last Year: Never true  Housing: Low Risk  (02/05/2020)   Housing    Last Housing Risk Score: 0  Physical Activity: Inactive (02/09/2021)   Exercise Vital Sign    Days of Exercise per Week: 0 days     Minutes of Exercise per Session: 0 min  Social Connections: Moderately Integrated (02/05/2020)   Social Connection and Isolation Panel [NHANES]    Frequency of Communication with Friends and Family: More than three times a week    Frequency of Social Gatherings with Friends and Family: Once a week    Attends Religious Services: More than 4 times per year    Active Member of [employer] or Organizations: Yes    Attends Archivist Meetings: 1 to 4 times per year    Marital Status: Widowed  Stress: No Stress Concern Present (02/09/2021)   [place]    Feeling of Stress : Not at all  Tobacco Use: Low Risk  (11/17/2021)   Patient History    Smoking Tobacco Use: Never    Smokeless Tobacco Use: Never    Passive Exposure: Not on file  Transportation Needs: No Transportation Needs (02/09/2021)   PRAPARE - Transportation    Lack of Transportation (Medical): No    Lack of Transportation (Non-Medical): No    CCM Care Plan  No Known Allergies  Medications Reviewed Today     Reviewed by Minda Ditto, Alyse Low, RT (Technologist) on 09/23/21 at 1317  Med List Status: <None>   Medication Order Taking? Sig Documenting Provider Last Dose Status Informant  ACCU-CHEK AVIVA PLUS test strip [id number] No USE TO TEST BLOOD SUGAR ONCE DAILY Brown, Naba G, MD Taking Active Child  Accu-Chek Softclix Lancets lancets [id number] No Use to check blood sugar once daily Brown, Judaea G, MD Taking Active Child  acetaminophen (TYLENOL) 325 MG tablet [id number] No Take 650 mg by mouth every 6 (six) hours as needed for mild pain, fever or headache. [provider] Taking Active Child  allopurinol (ZYLOPRIM) 100 MG tablet [id number] No TAKE 1 TABLET BY MOUTH TWICE A DAY Brown, Rubie G, MD Taking Active   Alpha-Lipoic Acid 600 MG TABS [id number] No Take 600 mg by mouth daily. [provider] Taking Active Child  apixaban (ELIQUIS) 5  MG  TABS tablet [id number] No Take 1 tablet (5 mg total) by mouth 2 (two) times daily. Brown, Kabrea G, MD Taking Active   buPROPion ER [facility] SR) 100 MG 12 hr tablet [id number] No Take 1 tablet (100 mg total) by mouth 2 (two) times daily. Brown, Neyla G, MD Taking Active   calcium carbonate (CALCIUM 600) 600 MG TABS tablet [id number] No Take 600 mg by mouth daily. [provider] Taking Active   DULoxetine (CYMBALTA) 60 MG capsule [id number] No Take 1 capsule (60 mg total) by mouth daily. Brown, Tamecka G, MD Taking Active   ezetimibe (ZETIA) 10 MG tablet [id number] No Take 1 tablet (10 mg total) by mouth daily. Brown, Lavita G, MD Taking Active   Homeopathic Products ([place]) [id number] No Apply 1 application topically daily as needed (foot cramps). [provider] Taking Active Child  ketoconazole (NIZORAL) 2 % cream [id number] No SMARTSIG:1 Application Topical 1 to 2 Times Daily [provider] Taking Active   L-Lysine 500 MG TABS [id number] No Take 1 tablet by mouth daily. [provider] Taking Active   Lancet Devices [facility]) lancets [id number] No Use to test blood sugar once a day. Brown, Floreine G, MD Taking Active Child  LIVALO 2 MG TABS [id number] No TAKE 1 TABLET BY MOUTH EVERY DAY Brown, Sonoma G, MD Taking Active   metroNIDAZOLE (METROCREAM) 0.75 % cream [id number] No Apply topically 2 (two) times daily. Apply a thin layer twice a day to a clean dry face. Billie Ruddy, MD Taking Active   Multiple Vitamin (MULTIVITAMIN) capsule [id number] No Take 1 capsule by mouth daily.  [provider] Taking Active Child  olmesartan (BENICAR) 20 MG tablet [id number] No Take 1 tablet (20 mg total) by mouth daily. Brown, Jerlyn G, MD Taking Active   pantoprazole (PROTONIX) 20 MG tablet [id number] No Take 1 tablet (20 mg total) by mouth daily. Brown, Tomekia G, MD Taking Active   traMADol Veatrice Bourbon) 50 MG tablet [id number]  Take 1 tablet (50 mg  total) by mouth every 8 (eight) hours as needed. Lajean Saver, MD  Active   vitamin B-12 (CYANOCOBALAMIN) 500 MCG tablet [id number] No Take 500 mcg by mouth daily. [provider] Taking Active             Patient Active Problem List   Diagnosis Date Noted   Atherosclerosis of aorta ([place]) 01/19/2021   (HFpEF) heart failure with preserved ejection fraction ([place]) 07/30/2020   Hx of pulmonary embolus 07/24/2020   Diplopia 04/09/2020   Epiretinal membrane (ERM) of left eye 04/09/2020   Pseudophakia of both eyes 04/09/2020   Myalgia due to statin 02/11/2020   Posterior capsular opacification of both eyes, obscuring vision 10/17/2019   Elbow dislocation, right, initial encounter 07/02/2019   Generalized osteoarthritis of multiple sites 06/26/2018   Incontinent of urine 04/28/2018   Gout, arthropathy 01/22/2018   Lower back pain 09/22/2017   Unstable gait 09/22/2017   Seborrheic dermatitis 01/23/2017   Seborrheic keratosis 09/27/2016   Falling asleep and waking up too early in the evening 08/19/2016   CKD (chronic kidney disease), stage III ([place]) 05/28/2016   GERD (gastroesophageal reflux disease) 02/25/2016   CVA (cerebral vascular accident) (HCC)-No residual deficit 02/12/2016   Glaucoma 02/12/2016   Depression, major, recurrent ([place]) 02/12/2016   Hyperlipidemia associated with type 2 diabetes mellitus ([place]) 02/12/2016   Vitamin D deficiency 01/14/2016   DM (diabetes mellitus), type 2 with neurological complications ([place]) 31/49/7026   Systolic  hypertension with cerebrovascular disease 01/14/2016   Peripheral neuropathy 01/14/2016   Acne rosacea 01/28/2013   Thyroid activity decreased 03/28/2009    Immunization History  Administered Date(s) Administered   Hepatitis B, adult 01/22/2018   Influenza Whole 02/01/2016   Influenza, High Dose Seasonal PF 01/14/2016, 11/17/2018   Influenza-Unspecified 01/12/2017, 12/26/2017, 12/13/2019   PFIZER(Purple Top)SARS-COV-2  Vaccination 05/03/2019, 05/28/2019, 01/11/2020   Tdap 12/24/2015   Zoster Recombinat (Shingrix) 08/16/2016, 01/12/2017   Patient went to bed at 9:30pm last night and woke up at 10:30am. She denies any issues with sleeping right now and is not taking melatonin anymore. She is taking a nap almost daily and sometimes it turns into 2-3 hours.  -tolerating livalo?  -BP? Elevated with last check in office  -no more pharmacy issues?  -new glucometer?  Conditions to be addressed/monitored:  Hypertension, Hyperlipidemia, Diabetes, GERD, Chronic Kidney Disease, Depression, Osteoarthritis and Gout  Conditions addressed this visit: Hypertension, hyperlipidemia, depression  There are no care plans that you recently modified to display for this patient.     Medication Assistance: None required.  Patient affirms current coverage meets needs.  Compliance/Adherence/Medication fill history: Care Gaps: COVID booster, eye exam, A1c, influenza vaccine, Prevnar20 Last BP: 148/81 (09/18/21) Last A1c: 6.1% (03/31/21)  Star-Rating Drugs: Olmesartan 20 mg - [facility] L[facility]o 2 mg - [facility]  [facility]  [id number]Phone: [phone]: [address] [facility]Phone: [phone]: [phone]  Uses pill box? Yes - daughter fills for her Pt endorses 100% compliance  We discussed: Current pharmacy is preferred with insurance plan and patient is satisfied with pharmacy services Patient decided to: Continue current medication management strategy  Care Plan and Follow Up Patient Decision:  Patient agrees to Care Plan and Follow-up.  Plan: Telephone follow up appointment with care management team member scheduled for:  6 months  MJeni Salles  PharmD BRunnemedePharmacist [place]at [phone]

## 2021-11-23 NOTE — Chronic Care Management (AMB) (Signed)
    Chronic Care Management Pharmacy Assistant   Name: Alexa Brown  MRN: [id number] DOB: 10/29/1940  11/24/2021 APPOINTMENT REMINDER  Called [name], No answer, left message of appointment on 11/24/2021 at 1:00 via telephone visit with [name] Pharm D. Notified to have all medications, supplements, blood pressure and/or blood sugar logs available during appointment and to return call if need to reschedule.  Care Gaps: AWV - scheduled 02/15/2022 Last BP - 126/70 on 09/03/2021 Last A1C - 5.8 on 11/02/2020 Covid booster - overdue Eye exam - overdue HGA1C - overdue Flu - due Pneumonia vaccine - postponed  Star Rating Drug: Livalo 2 mg - last filled [facility]25[facility]2021 [facility] Olmesartan 20 mg - last filled [facility]21/[facility]21 [facility]  Any gaps in medications fill history? No  [name] [facility]  Catering manager [phone]

## 2021-11-24 ENCOUNTER — Telehealth: Payer: Self-pay | Admitting: Pharmacist

## 2021-11-24 ENCOUNTER — Telehealth: Payer: Medicare Other

## 2021-11-24 NOTE — Telephone Encounter (Signed)
  Chronic Care Management   Outreach Note  11/24/2021 Name: Alexa Brown MRN: [id number] DOB: Apr 18, 1940  Referred by: Martinique, Terrina G, MD  Patient had a phone appointment scheduled with clinical pharmacist today.  An unsuccessful telephone outreach was attempted today. The patient was referred to the pharmacist for assistance with care management and care coordination.   If possible, a message was left to return call to: [phone] or to [facility] at [facility]: [place], PharmD, [place] Pharmacist [place] at [place]

## 2021-11-26 ENCOUNTER — Ambulatory Visit (INDEPENDENT_AMBULATORY_CARE_PROVIDER_SITE_OTHER): Payer: Medicare Other | Admitting: Podiatry

## 2021-11-26 ENCOUNTER — Encounter: Payer: Self-pay | Admitting: Podiatry

## 2021-11-26 DIAGNOSIS — L84 Corns and callosities: Secondary | ICD-10-CM | POA: Diagnosis not present

## 2021-11-26 DIAGNOSIS — E1142 Type 2 diabetes mellitus with diabetic polyneuropathy: Secondary | ICD-10-CM

## 2021-11-26 DIAGNOSIS — M79675 Pain in left toe(s): Secondary | ICD-10-CM

## 2021-11-26 DIAGNOSIS — M79674 Pain in right toe(s): Secondary | ICD-10-CM | POA: Diagnosis not present

## 2021-11-26 DIAGNOSIS — B351 Tinea unguium: Secondary | ICD-10-CM | POA: Diagnosis not present

## 2021-11-26 NOTE — Progress Notes (Signed)
  Subjective:  Patient ID: Alexa Brown, female    DOB: 08/27/1940,  MRN: [id number]  Alexa Brown presents to clinic today for at risk foot care with history of diabetic neuropathy and callus(es) right lower extremity and painful thick toenails that are difficult to trim. Painful toenails interfere with ambulation. Aggravating factors include wearing enclosed shoe gear. Pain is relieved with periodic professional debridement. Painful calluses are aggravated when weightbearing with and without shoegear. Pain is relieved with periodic professional debridement.  New problem(s): None.   PCP is Martinique, Shanikka G, MD , and last visit was September 03, 2021.  Her daughter is present during today's visit.  No Known Allergies  Review of Systems: Negative except as noted in the HPI. Objective:   Constitutional Alexa Brown is a pleasant 81 y.o. Caucasian female, WD, WN in NAD. AAO x 3.   Vascular CFT <3 seconds b/l LE. Palpable DP/PT pulses b/l LE. Digital hair absent b/l. Skin temperature gradient WNL b/l. No pain with calf compression b/l. No edema noted b/l. No cyanosis or clubbing noted b/l LE.  Neurologic Normal speech. Oriented to person, place, and time. Pt has subjective symptoms of neuropathy. Protective sensation intact 5/5 intact bilaterally with 10g monofilament b/l. Vibratory sensation intact b/l.  Dermatologic Pedal integument with normal turgor, texture and tone b/l LE. No open wounds b/l. No interdigital macerations b/l. Toenails 1-5 b/l elongated, thickened, discolored with subungual debris. +Tenderness with dorsal palpation of nailplates. Hyperkeratotic lesion(s) noted medial IPJ of right great toe.  Orthopedic: Muscle strength 5/5 to all lower extremity muscle groups bilaterally. HAV with bunion deformity noted b/l LE. Utilizes cane for ambulation assistance.   Radiographs: None  Last A1c:     Latest Ref Rng & Units 03/31/2021   10:05 AM  Hemoglobin A1C   Hemoglobin-A1c 5.7 - 6.4 % 6.1    Assessment:   1. Pain due to onychomycosis of toenails of both feet   2. Callus   3. Diabetic peripheral neuropathy associated with type 2 diabetes mellitus ([place])    Plan:  Patient was evaluated and treated and all questions answered. Consent given for treatment as described below: -Patient's family member present. All questions/concerns addressed on today's visit. -Examined patient. -Patient to continue soft, supportive shoe gear daily. -Toenails 1-5 b/l were debrided in length and girth with sterile nail nippers and dremel without iatrogenic bleeding.  -Callus(es) medial IPJ of right great toe pared utilizing sterile scalpel blade without complication or incident. Total number debrided =1. -Patient/POA to call should there be question/concern in the interim.  Return in about 3 months (around 02/25/2022).  Marzetta Board, DPM

## 2021-11-29 ENCOUNTER — Encounter: Payer: Self-pay | Admitting: Family Medicine

## 2021-11-29 DIAGNOSIS — R0982 Postnasal drip: Secondary | ICD-10-CM

## 2021-12-01 ENCOUNTER — Telehealth: Payer: Self-pay | Admitting: Pharmacist

## 2021-12-01 NOTE — Chronic Care Management (AMB) (Signed)
Chronic Care Management Pharmacy Assistant   Name: Alexa Brown  MRN: [id number] DOB: 12-21-1940  Reason for Encounter: Disease State / Hypertension Assessment Call   Conditions to be addressed/monitored: HTN  Recent office visits:  None  Recent consult visits:  11/26/2021 Acquanetta Sit DPM (podiatry) - Patient was seen for Pain due to onychomycosis of toenails of both feet and additional concerns. No medication changes. Follow up in 3 months.  10/08/2021 Sherilyn Cooter MD (orthopedic) - Patient was seen for Elbow dislocation, right, initial encounter. No medication changes. No follow up noted.   09/23/2021 Sherilyn Cooter MD (orthopedic) - Patient was seen for Elbow dislocation, right, initial encounter. No medication changes. No follow up noted.  Hospital visits:  Patient was seen at [facility] ED on 09/18/2021 (7 hours) due to Fall from slip, trip, or stumble, initial encounter, Closed dislocation of right elbow and Avulsion fracture .    New?Medications Started at [facility] Discharge:?? Started Tramadol 50 mg q 8 hours prn Medication Changes at Hospital Discharge: No medication changes Medications Discontinued at Hospital Discharge: No medications discontinued Medications that remain the same after Hospital Discharge:??  -All other medications will remain the same.    Medications: Outpatient Encounter Medications as of 12/01/2021  Medication Sig   Accu-Chek Softclix Lancets lancets Use to check blood sugars 1-2 times daily. Dx:E11.65   acetaminophen (TYLENOL) 325 MG tablet Take 650 mg by mouth every 6 (six) hours as needed for mild pain, fever or headache.   allopurinol (ZYLOPRIM) 100 MG tablet TAKE 1 TABLET BY MOUTH TWICE A DAY   Alpha-Lipoic Acid 600 MG TABS Take 600 mg by mouth daily.   apixaban (ELIQUIS) 5 MG TABS tablet Take 1 tablet (5 mg total) by mouth 2 (two) times daily.   Blood Glucose Monitoring Suppl (ACCU-CHEK GUIDE) w/Device KIT  Use to check blood sugars 1-2 times daily. Dx:E11.65   buPROPion ER (WELLBUTRIN SR) 100 MG 12 hr tablet TAKE 1 TABLET BY MOUTH TWICE A DAY   calcium carbonate (CALCIUM 600) 600 MG TABS tablet Take 600 mg by mouth daily.   DULoxetine (CYMBALTA) 60 MG capsule TAKE 1 CAPSULE BY MOUTH EVERY DAY   ezetimibe (ZETIA) 10 MG tablet Take 1 tablet (10 mg total) by mouth daily.   glucose blood (ACCU-CHEK GUIDE) test strip Use to test blood sugars 1-2 times daily. Dx:e11.65   Homeopathic Products (THERAWORX RELIEF EX) Apply 1 application topically daily as needed (foot cramps).   ketoconazole (NIZORAL) 2 % cream SMARTSIG:1 Application Topical 1 to 2 Times Daily   L-Lysine 500 MG TABS Take 1 tablet by mouth daily.   Lancet Devices (ACCU-CHEK SOFTCLIX) lancets Use to test blood sugar once a day.   LIVALO 2 MG TABS TAKE 1 TABLET BY MOUTH EVERY DAY   metroNIDAZOLE (METROCREAM) 0.75 % cream Apply topically 2 (two) times daily. Apply a thin layer twice a day to a clean dry face.   Multiple Vitamin (MULTIVITAMIN) capsule Take 1 capsule by mouth daily.    olmesartan (BENICAR) 20 MG tablet Take 1 tablet (20 mg total) by mouth daily.   pantoprazole (PROTONIX) 20 MG tablet Take 1 tablet (20 mg total) by mouth daily.   traMADol (ULTRAM) 50 MG tablet Take 1 tablet (50 mg total) by mouth every 8 (eight) hours as needed.   vitamin B-12 (CYANOCOBALAMIN) 500 MCG tablet Take 500 mcg by mouth daily.   No facility-administered encounter medications on file as of 12/01/2021.   Reviewed chart  prior to disease state call. Spoke with patient regarding BP  Recent Office Vitals: BP Readings from Last 3 Encounters:  09/18/21 (!) 148/81  09/03/21 126/70  08/26/21 (!) 139/50   Pulse Readings from Last 3 Encounters:  09/18/21 87  09/03/21 90  08/26/21 81    Wt Readings from Last 3 Encounters:  09/18/21 161 lb (73 kg)  09/03/21 164 lb 2 oz (74.4 kg)  08/26/21 161 lb (73 kg)     Kidney Function Lab Results  Component  Value Date/Time   CREATININE 1.08 06/29/2021 09:23 AM   CREATININE 1.01 01/19/2021 03:30 PM   CREATININE 1.00 (H) 02/11/2020 09:05 AM   CREATININE 0.92 07/21/2017 04:02 PM   GFR 49.02 (L) 06/29/2021 09:23 AM   GFRNONAA >60 07/27/2020 04:35 AM   GFRNONAA 54 (L) 02/11/2020 09:05 AM   GFRAA 63 02/11/2020 09:05 AM       Latest Ref Rng & Units 06/29/2021    9:23 AM 01/19/2021    3:30 PM 07/30/2020   11:58 AM  BMP  Glucose 70 - 99 mg/dL 109  104  107   BUN 6 - 23 mg/dL '16  17  26   ' Creatinine 0.40 - 1.20 mg/dL 1.08  1.01  1.07   Sodium 135 - 145 mEq/L 142  137  137   Potassium 3.5 - 5.1 mEq/L 4.3  4.5  4.6   Chloride 96 - 112 mEq/L 107  104  105   CO2 19 - 32 mEq/L '25  25  24   ' Calcium 8.4 - 10.5 mg/dL 10.5  10.2  10.5   Fill History: ALLOPURINOL 100 MG TABLET 09/26/2021 90   ELIQUIS 5 MG TABLET 09/29/2021 90   BUPROPION HCL SR 100 MG TABLET 11/23/2021 90   DULOXETINE HCL DR 60 MG CAP 11/23/2021 90   EZETIMIBE 10 MG TABLET 09/08/2021 90   KETOCONAZOLE  2 % CREA 11/06/2020 30   METRONIDAZOLE  0.75 % CREA 03/11/2021 22   OLMESARTAN MEDOXOMIL 20 MG TAB 10/21/2021 90   LIVALO 2 MG TABLET 09/25/2021 90   PRAVASTATIN SODIUM  20 MG TABS 05/08/2020 90   TRAMADOL HCL 50 MG TABLET 09/18/2021 5    Current antihypertensive regimen:  Olmesartan 20 mg daily  How often are you checking your Blood Pressure? Spoke with patients daughter and she states she is checking blood pressures intermittently.   Current home BP readings: Patient's daughter does not have her exact readings however, states they are always between 120/70 and 140/80.   What recent interventions/DTPs have been made by any provider to improve Blood Pressure control since last CPP Visit: No recent interventions  Any recent hospitalizations or ED visits since last visit with CPP? Patient was seen at [facility] ED on 09/18/2021 (7 hours) due to Fall from slip, trip, or stumble, initial encounter, Closed  dislocation of right elbow and Avulsion fracture   What diet changes have been made to improve Blood Pressure Control?  Patient follows a mediterranean diet Breakfast - patient will have either cereal or toast with peanut butter and jelly with juice and coffee Lunch - patient will have left overs and fruit Dinner - patient will have patients daughter always cooks and she will have a meal containing a meat with vegetables.  What exercise is being done to improve your Blood Pressure Control?  Patient stays active throughout the day, she will occasionally walk and does house work daily.  Adherence Review: Is the patient currently on ACE/ARB  medication? Yes Does the patient have >5 day gap between last estimated fill dates? No  Care Gaps: AWV - scheduled 02/15/2022 Last BP - 126/70 on 09/03/2021 Last A1C - 5.5 on 11/02/2020 Covid booster - overdue Eye exam - overdue HGA1C - overdue Flu - due Pneumonia vaccine - postponed  Star Rating Drugs: Livalo 2 mg - last filled 09/25/2021 90 DS at [facility] Olmesartan 20 mg - last filled 10/21/2021 90 DS at [employer] Pharmacist Assistant [phone]

## 2021-12-07 ENCOUNTER — Ambulatory Visit
Admission: RE | Admit: 2021-12-07 | Discharge: 2021-12-07 | Disposition: A | Discharge status: Home / Self Care | Payer: Medicare Other | Source: Ambulatory Visit | Attending: Family Medicine | Admitting: Family Medicine

## 2021-12-07 DIAGNOSIS — R928 Other abnormal and inconclusive findings on diagnostic imaging of breast: Secondary | ICD-10-CM | POA: Diagnosis not present

## 2021-12-07 DIAGNOSIS — N6041 Mammary duct ectasia of right breast: Secondary | ICD-10-CM | POA: Diagnosis not present

## 2021-12-27 NOTE — Progress Notes (Signed)
HPI: Alexa Brown is a 81 y.o. female, who is here today with her daughter for follow up. She was last seen on 09/03/21 for ED follow up after a fall. No new problems since her last visit. Diabetes Mellitus II: Diagnosed in 2016. - Checking BG at home: Fasting BS 120s to 140s. -Non pharmacologic treatment. - eye exam: 08/2020 - foot exam: 07/2021. - Negative for symptoms of hypoglycemia, polyuria, polydipsia, foot ulcers/trauma. + Peripheral neuropathy on Duloxetine 60 mg daily.  Lab Results  Component Value Date   HGBA1C 6.1 03/31/2021   Lab Results  Component Value Date   MICROALBUR <0.7 06/29/2021   Hypertension:  Medications:Benicar 20 mg daily. BP readings at home: Has not checked recently. Negative for unusual or severe headache, visual changes, exertional chest pain, dyspnea, new focal weakness (status post CVA), or edema. CKD II: She has not noted foamy urine, gross hematuria, or decreased urine output. Lab Results  Component Value Date   CREATININE 1.08 06/29/2021   BUN 16 06/29/2021   NA 142 06/29/2021   K 4.3 06/29/2021   CL 107 06/29/2021   CO2 25 06/29/2021   Gout: She is on Allopurinol 100 mg bid. Last episode of gout many years ago.  Lab Results  Component Value Date   LABURIC 5.7 12/18/2018   Hyperlipidemia: Currently on Livalo 2 mg daily and zetia 10 mg daily. She is tolerating medication well. She has not tolerated other statins. Lab Results  Component Value Date   CHOL 211 (H) 06/29/2021   HDL 70.30 06/29/2021   LDLCALC 122 (H) 06/29/2021   TRIG 90.0 06/29/2021   CHOLHDL 3 06/29/2021   Frustrated with urinary incontinence, hasving leakage and urgency. Wearing pads, urine goes through clothing. Reporting that in her 20's she had some bladder issues, underwent "stretching of the bladder."  She has requested appt with ENT because post nasal thick drainage. Problem has been intermittent for a while. She has not received appt  information. Negative for sore throat or sinus pressure. She has not tried OTC medication. Review of Systems  Constitutional:  Negative for activity change, appetite change and fever.  HENT:  Negative for mouth sores, nosebleeds and sore throat.   Respiratory:  Negative for cough and wheezing.   Gastrointestinal:  Negative for abdominal pain, nausea and vomiting.       Negative for changes in bowel habits.  Musculoskeletal:  Positive for arthralgias and gait problem.  Skin:  Negative for rash.  Neurological:  Negative for syncope and facial asymmetry.  Psychiatric/Behavioral:  Negative for confusion. The patient is nervous/anxious.   Rest see pertinent positives and negatives per HPI. Current Outpatient Medications on File Prior to Visit  Medication Sig Dispense Refill   Accu-Chek Softclix Lancets lancets Use to check blood sugars 1-2 times daily. Dx:E11.65 200 each 12   acetaminophen (TYLENOL) 325 MG tablet Take 650 mg by mouth every 6 (six) hours as needed for mild pain, fever or headache.     allopurinol (ZYLOPRIM) 100 MG tablet TAKE 1 TABLET BY MOUTH TWICE A DAY 180 tablet 1   Alpha-Lipoic Acid 600 MG TABS Take 600 mg by mouth daily.     apixaban (ELIQUIS) 5 MG TABS tablet Take 1 tablet (5 mg total) by mouth 2 (two) times daily. 180 tablet 3   Blood Glucose Monitoring Suppl (ACCU-CHEK GUIDE) w/Device KIT Use to check blood sugars 1-2 times daily. Dx:E11.65 1 kit 0   buPROPion ER (WELLBUTRIN SR) 100 MG 12 hr tablet  TAKE 1 TABLET BY MOUTH TWICE A DAY 180 tablet 1   calcium carbonate (CALCIUM 600) 600 MG TABS tablet Take 600 mg by mouth daily.     DULoxetine (CYMBALTA) 60 MG capsule TAKE 1 CAPSULE BY MOUTH EVERY DAY 90 capsule 1   ezetimibe (ZETIA) 10 MG tablet Take 1 tablet (10 mg total) by mouth daily. 30 tablet 0   glucose blood (ACCU-CHEK GUIDE) test strip Use to test blood sugars 1-2 times daily. Dx:e11.65 200 each 12   Homeopathic Products (THERAWORX RELIEF EX) Apply 1 application  topically daily as needed (foot cramps).     ketoconazole (NIZORAL) 2 % cream SMARTSIG:1 Application Topical 1 to 2 Times Daily     L-Lysine 500 MG TABS Take 1 tablet by mouth daily.     Lancet Devices (ACCU-CHEK SOFTCLIX) lancets Use to test blood sugar once a day. 100 each 2   LIVALO 2 MG TABS TAKE 1 TABLET BY MOUTH EVERY DAY 90 tablet 1   metroNIDAZOLE (METROCREAM) 0.75 % cream Apply topically 2 (two) times daily. Apply a thin layer twice a day to a clean dry face. 45 g 0   Multiple Vitamin (MULTIVITAMIN) capsule Take 1 capsule by mouth daily.      olmesartan (BENICAR) 20 MG tablet Take 1 tablet (20 mg total) by mouth daily. 90 tablet 2   pantoprazole (PROTONIX) 20 MG tablet Take 1 tablet (20 mg total) by mouth daily. 90 tablet 2   vitamin B-12 (CYANOCOBALAMIN) 500 MCG tablet Take 500 mcg by mouth daily.     No current facility-administered medications on file prior to visit.   Past Medical History:  Diagnosis Date   Chicken pox    Depression    Diabetes mellitus without complication (HCC)    GERD (gastroesophageal reflux disease)    Hyperlipidemia    Hypertension    Stroke [facility])    UTI (urinary tract infection)    No Known Allergies  Social History   Socioeconomic History   Marital status: Widowed    Spouse name: Not on file   Number of children: Not on file   Years of education: Not on file   Highest education level: Not on file  Occupational History   Occupation: N/A  Tobacco Use   Smoking status: Never   Smokeless tobacco: Never  Vaping Use   Vaping Use: Never used  Substance and Sexual Activity   Alcohol use: Yes    Comment: occassional wine   Drug use: No   Sexual activity: Not Currently  Other Topics Concern   Not on file  Social History Narrative   Pt lives in single story home with her daughter   Has 2 children   Some college education   Retired Engineer, production for [employer]   Social Determinants of Health   Financial Resource Strain: [place]  (02/09/2021)   Overall Financial Resource Strain (CARDIA)    Difficulty of Paying Living Expenses: Not hard at all  Food Insecurity: No Food Insecurity (02/09/2021)   Hunger Vital Sign    Worried About Running Out of Food in the Last Year: Never true    [place] in the Last Year: Never true  Transportation Needs: No Transportation Needs (02/09/2021)   PRAPARE - Hydrologist (Medical): No    Lack of Transportation (Non-Medical): No  Physical Activity: Inactive (02/09/2021)   Exercise Vital Sign    Days of Exercise per Week: 0 days  Minutes of Exercise per Session: 0 min  Stress: No Stress Concern Present (02/09/2021)   [place]    Feeling of Stress : Not at all  Social Connections: Moderately Integrated (02/05/2020)   Social Connection and Isolation Panel [NHANES]    Frequency of Communication with Friends and Family: More than three times a week    Frequency of Social Gatherings with Friends and Family: Once a week    Attends Religious Services: More than 4 times per year    Active Member of [employer] or Organizations: Yes    Attends Archivist Meetings: 1 to 4 times per year    Marital Status: Widowed   Vitals:   12/28/21 0846  BP: 120/76  Pulse: 100  Resp: 16  Temp: 98.5 F (36.9 C)  SpO2: 96%  Body mass index is 26.02 kg/m.  Physical Exam Vitals and nursing note reviewed.  Constitutional:      General: She is not in acute distress.    Appearance: She is well-developed.  HENT:     Head: Normocephalic and atraumatic.     Mouth/Throat:     Mouth: Mucous membranes are moist.     Pharynx: Oropharynx is clear.  Eyes:     Conjunctiva/sclera: Conjunctivae normal.  Cardiovascular:     Rate and Rhythm: Normal rate and regular rhythm.     Heart sounds: No murmur heard.    Comments: DP and PT pulses palpable bilateral. Pulmonary:     Effort: Pulmonary  effort is normal. No respiratory distress.     Breath sounds: Normal breath sounds.  Abdominal:     Palpations: Abdomen is soft. There is no hepatomegaly or mass.     Tenderness: There is no abdominal tenderness.  Musculoskeletal:     Right lower leg: No edema.     Left lower leg: No edema.  Skin:    General: Skin is warm.     Findings: No erythema or rash.  Neurological:     General: No focal deficit present.     Mental Status: She is alert and oriented to person, place, and time.     Cranial Nerves: No cranial nerve deficit.     Comments: Unstable gait, assisted with a walker.  Psychiatric:     Comments: Well groomed, good eye contact.   ASSESSMENT AND PLAN:  Ms.Permelia was seen today for follow-up.  Diagnoses and all orders for this visit: Orders Placed This Encounter  Procedures   Comprehensive metabolic panel   Hemoglobin A1c   Lipid panel   Uric acid   Ambulatory referral to Urology   Lab Results  Component Value Date   CHOL 205 (H) 12/28/2021   HDL 73.40 12/28/2021   LDLCALC 94 12/28/2021   TRIG 190.0 (H) 12/28/2021   CHOLHDL 3 12/28/2021   Lab Results  Component Value Date   LABURIC 5.1 12/28/2021   Lab Results  Component Value Date   CREATININE 1.10 12/28/2021   BUN 19 12/28/2021   NA 138 12/28/2021   K 4.2 12/28/2021   CL 105 12/28/2021   CO2 25 12/28/2021   Lab Results  Component Value Date   ALT 21 12/28/2021   AST 23 12/28/2021   ALKPHOS 46 12/28/2021   BILITOT 0.4 12/28/2021   Lab Results  Component Value Date   HGBA1C 6.6 (H) 12/28/2021   Post-nasal drip History and examination do not suggest a serious process, most likely related to allergies. ENT referral  has already been placed, Dr Deeann Saint contact information given.  DM (diabetes mellitus), type 2 with neurological complications ([place]) HAL9F has been at goal. Continue nonpharmacologic treatment. Annual eye exam, periodic dental and foot care recommended. F/U in 5-6  months.  Hyperlipidemia associated with type 2 diabetes mellitus ([place]) She has tolerated Livalo and Zetia well, so continue current dose. FLP will be done today, recommendations will be given according to results.  Systolic hypertension with cerebrovascular disease BP adequately controlled. Continue Benicar 20 mg daily and low salt diet. Eye exam is current.  Depression, major, recurrent ([place]) Overall stable. Continue Duloxetine and Wellbutrin same dose.  Incontinent of urine Problem is getting worse, affecting her daily activities. Kegel exercises did not help. Reporting bladder procedure in her 20's . We discussed treatment options and some side effects of medications use for urgency, not sure it will help with constant leakage. Urology referral placed.  CKD (chronic kidney disease), stage III ([place]) Problem has been stable. Continue adequate glucose and BP controlled, hydration,low salt diet,and avoidance of NSAID's. Continue Benicar.  Gout, arthropathy She has not had an episode in years. Continue Allopurinol 100 mg bid.  I spent a total of 43 minutes in both face to face and non face to face activities for this visit on the date of this encounter. During this time history was obtained and documented, examination was performed, prior labs reviewed, and assessment/plan discussed.  Return in about 6 months (around 06/29/2022).  Yecenia G. Martinique, MD  [facility]. [place] office.

## 2021-12-28 ENCOUNTER — Encounter: Payer: Self-pay | Admitting: Family Medicine

## 2021-12-28 ENCOUNTER — Ambulatory Visit (INDEPENDENT_AMBULATORY_CARE_PROVIDER_SITE_OTHER): Payer: Medicare Other | Admitting: Family Medicine

## 2021-12-28 VITALS — BP 120/76 | HR 100 | Temp 98.5°F | Resp 16 | Ht 67.0 in | Wt 166.1 lb

## 2021-12-28 DIAGNOSIS — E1169 Type 2 diabetes mellitus with other specified complication: Secondary | ICD-10-CM | POA: Diagnosis not present

## 2021-12-28 DIAGNOSIS — E1149 Type 2 diabetes mellitus with other diabetic neurological complication: Secondary | ICD-10-CM

## 2021-12-28 DIAGNOSIS — R0982 Postnasal drip: Secondary | ICD-10-CM

## 2021-12-28 DIAGNOSIS — I674 Hypertensive encephalopathy: Secondary | ICD-10-CM | POA: Diagnosis not present

## 2021-12-28 DIAGNOSIS — F332 Major depressive disorder, recurrent severe without psychotic features: Secondary | ICD-10-CM

## 2021-12-28 DIAGNOSIS — N3945 Continuous leakage: Secondary | ICD-10-CM

## 2021-12-28 DIAGNOSIS — M109 Gout, unspecified: Secondary | ICD-10-CM

## 2021-12-28 DIAGNOSIS — N1831 Chronic kidney disease, stage 3a: Secondary | ICD-10-CM | POA: Diagnosis not present

## 2021-12-28 DIAGNOSIS — E785 Hyperlipidemia, unspecified: Secondary | ICD-10-CM

## 2021-12-28 LAB — COMPREHENSIVE METABOLIC PANEL
ALT: 21 U/L (ref 0–35)
AST: 23 U/L (ref 0–37)
Albumin: 4.4 g/dL (ref 3.5–5.2)
Alkaline Phosphatase: 46 U/L (ref 39–117)
BUN: 19 mg/dL (ref 6–23)
CO2: 25 mEq/L (ref 19–32)
Calcium: 10 mg/dL (ref 8.4–10.5)
Chloride: 105 mEq/L (ref 96–112)
Creatinine, Ser: 1.1 mg/dL (ref 0.40–1.20)
GFR: 47.79 mL/min — ABNORMAL LOW (ref >60.00)
Glucose, Bld: 117 mg/dL — ABNORMAL HIGH (ref 70–99)
Potassium: 4.2 mEq/L (ref 3.5–5.1)
Sodium: 138 mEq/L (ref 135–145)
Total Bilirubin: 0.4 mg/dL (ref 0.2–1.2)
Total Protein: 7.5 g/dL (ref 6.0–8.3)

## 2021-12-28 LAB — LIPID PANEL
Cholesterol: 205 mg/dL — ABNORMAL HIGH (ref 0–200)
HDL: 73.4 mg/dL (ref >39.00)
LDL Cholesterol: 94 mg/dL (ref 0–99)
NonHDL: 132.02
Total CHOL/HDL Ratio: 3
Triglycerides: 190 mg/dL — ABNORMAL HIGH (ref 0.0–149.0)
VLDL: 38 mg/dL (ref 0.0–40.0)

## 2021-12-28 LAB — HEMOGLOBIN A1C: Hgb A1c MFr Bld: 6.6 % — ABNORMAL HIGH (ref 4.6–6.5)

## 2021-12-28 LAB — URIC ACID: Uric Acid, Serum: 5.1 mg/dL (ref 2.4–7.0)

## 2021-12-28 NOTE — Assessment & Plan Note (Signed)
Overall stable. Continue Duloxetine and Wellbutrin same dose.

## 2021-12-28 NOTE — Assessment & Plan Note (Signed)
Problem is getting worse, affecting her daily activities. Kegel exercises did not help. Reporting bladder procedure in her 20's . We discussed treatment options and some side effects of medications use for urgency, not sure it will help with constant leakage. Urology referral placed.

## 2021-12-28 NOTE — Assessment & Plan Note (Signed)
BP adequately controlled. Continue Benicar 20 mg daily and low salt diet. Eye exam is current.

## 2021-12-28 NOTE — Assessment & Plan Note (Signed)
She has tolerated Livalo and Zetia well, so continue current dose. FLP will be done today, recommendations will be given according to results.

## 2021-12-28 NOTE — Patient Instructions (Addendum)
A few things to remember from today's visit:   Post-nasal drip  DM (diabetes mellitus), type 2 with neurological complications ([place]) - Plan: Hemoglobin A1c  Continuous leakage of urine - Plan: Ambulatory referral to Urology  Hyperlipidemia associated with type 2 diabetes mellitus ([place]) - Plan: Comprehensive metabolic panel, Lipid panel  Systolic hypertension with cerebrovascular disease This is the ENT you were referred to for post nasla drainage. Nasal saline irrigations may help. Over the counter antihistaminics like Loratadine 5-10 mg daily can be tried but can cause dryness.  SU WOOI TEOH [place]  [phone]  For urinary incontinence, urology referral placed.  No changes in rest.  If you need refills for medications you take chronically, please call your pharmacy. Do not use My Chart to request refills or for acute issues that need immediate attention. If you send a my chart message, it may take a few days to be addressed, specially if I am not in the office.  Please be sure medication list is accurate. If a new problem present, please set up appointment sooner than planned today.

## 2021-12-28 NOTE — Assessment & Plan Note (Signed)
Problem has been stable. Continue adequate glucose and BP controlled, hydration,low salt diet,and avoidance of NSAID's. Continue Benicar.

## 2021-12-28 NOTE — Assessment & Plan Note (Signed)
She has not had an episode in years. Continue Allopurinol 100 mg bid.

## 2022-01-02 MED ORDER — LIVALO 2 MG PO TABS: 1.0000 | ORAL_TABLET | Freq: Every day | ORAL | 3 refills | Status: DC

## 2022-01-02 MED ORDER — EZETIMIBE 10 MG PO TABS: 10.0000 mg | ORAL_TABLET | Freq: Every day | ORAL | 3 refills | Status: DC

## 2022-01-12 ENCOUNTER — Telehealth: Payer: Self-pay | Admitting: Pharmacist

## 2022-01-12 NOTE — Chronic Care Management (AMB) (Unsigned)
    Chronic Care Management Pharmacy Assistant   Name: Alexa Brown  MRN: [id number] DOB: [birth year] *** APPOINTMENT REMINDER   Called Alexa Brown, No answer, left message of appointment on *** at *** via {telephone/office:[phone]} visit with Alexa Brown, Pharm D. Notified to have all medications, supplements, blood pressure and/or blood sugar logs available during appointment and to return call if need to reschedule.  OR: Alexa Brown was reminded to have all medications, supplements and any blood glucose and blood pressure readings available for review with Alexa Brown, Pharm. D, at her {telephone/office:[phone]} visit on *** at ***.   Care Gaps:   Star Rating Drug:   Any gaps in medications fill history?  [place] Pharmacist Assistant [phone]

## 2022-01-13 NOTE — Progress Notes (Signed)
Chronic Care Management Pharmacy Note  01/14/2022 Name:  Alexa Brown MRN:  [id number] DOB:  [birth year]  Summary: Pt is having dizzy spells and at home and lower BP readings Triglycerides and A1c increased with last lab work  Recommendations/Changes made from today's visit: -Recommended continued BP monitoring and calling the office next week if dizziness persists for a visit with PCP -Recommended limiting carbs/sugary foods given drastic increase in triglycerides and increase in A1c  Plan: BP assessment in 3 months Follow up in 6 months   Subjective: Alexa Brown is an 81 y.o. year old female who is a primary patient of Alexa Brown, Alexa So, MD.  The CCM team was consulted for assistance with disease management and care coordination needs.    Engaged with patient by telephone for follow up visit in response to provider referral for pharmacy case management and/or care coordination services.   Consent to Services:  The patient was given information about Chronic Care Management services, agreed to services, and gave verbal consent prior to initiation of services.  Please see initial visit note for detailed documentation.   Patient Care Team: Alexa Brown, Verlia G, MD as PCP - General (Family Medicine) Viona Gilmore, [facility] as Pharmacist (Pharmacist)  Recent office visits: 12/28/21 Columbia Martinique, MD: Patient presented for chronic conditions follow up. Referred to urology for urine leakage. Follow up in 6 months.  09/03/2021 [name] MD - Patient was seen post ED visit for a fall. No medication changes.  Return if symptoms worsen or fail to improve.  Recent consult visits: 11/26/2021 Acquanetta Sit DPM (podiatry) - Patient was seen for Pain due to onychomycosis of toenails of both feet and additional concerns. No medication changes. Follow up in 3 months.   10/08/2021 Sherilyn Cooter MD (orthopedic) - Patient was seen for Elbow dislocation, right, initial encounter.  No medication changes. No follow up noted.    09/23/2021 Sherilyn Cooter MD (orthopedic) - Patient was seen for Elbow dislocation, right, initial encounter. No medication changes. No follow up noted.  08/17/2021 Acquanetta Sit DPM (podiatry) - Patient was seen for Pain due to onychomycosis of toenails of both feet and additional issues. No medication changes. Follow up in 3 months.   Hospital visits: Patient was seen at [facility] ED on 09/18/2021 (7 hours) due to Fall from slip, trip, or stumble, initial encounter, Closed dislocation of right elbow and Avulsion fracture .    New?Medications Started at [facility] Discharge:?? Started Tramadol 50 mg q 8 hours prn Medication Changes at Hospital Discharge: No medication changes Medications Discontinued at Hospital Discharge: No medications discontinued Medications that remain the same after Hospital Discharge:??  -All other medications will remain the same.    Patient was seen at [facility] ED on 08/26/2021 (1 hr) due to Scalp laceration, initial encounter.  New?Medications Started at [facility] Discharge:?? No medications started Medication Changes at Hospital Discharge: No medication changes Medications Discontinued at Hospital Discharge: No medication discontinued Medications that remain the same after Hospital Discharge:??  -All other medications will remain the same.    Objective:  Lab Results  Component Value Date   CREATININE 1.10 12/28/2021   BUN 19 12/28/2021   GFR 47.79 (L) 12/28/2021   GFRNONAA >60 07/27/2020   GFRAA 63 02/11/2020   NA 138 12/28/2021   K 4.2 12/28/2021   CALCIUM 10.0 12/28/2021   CO2 25 12/28/2021   GLUCOSE 117 (H) 12/28/2021    Lab Results  Component Value Date/Time   HGBA1C  6.6 (H) 12/28/2021 09:41 AM   HGBA1C 6.1 03/31/2021 10:05 AM   HGBA1C 5.5 11/02/2020 08:56 AM   HGBA1C 6.2 07/14/2020 09:17 AM   HGBA1C 5.7 07/27/2015 12:00 AM   HGBA1C 5.9 03/27/2015 12:00 AM    FRUCTOSAMINE 253 12/18/2018 09:23 AM   GFR 47.79 (L) 12/28/2021 09:41 AM   GFR 49.02 (L) 06/29/2021 09:23 AM   MICROALBUR <0.7 06/29/2021 09:23 AM   MICROALBUR 1.1 07/14/2020 09:17 AM    Last diabetic Eye exam: No results found for: "HMDIABEYEEXA"  Last diabetic Foot exam: No results found for: "HMDIABFOOTEX"   Lab Results  Component Value Date   CHOL 205 (H) 12/28/2021   HDL 73.40 12/28/2021   LDLCALC 94 12/28/2021   TRIG 190.0 (H) 12/28/2021   CHOLHDL 3 12/28/2021       Latest Ref Rng & Units 12/28/2021    9:41 AM 07/27/2020    4:35 AM 07/26/2020    2:57 AM  Hepatic Function  Total Protein 6.0 - 8.3 g/dL 7.5     Albumin 3.5 - 5.2 g/dL 4.4  3.4  3.6   AST 0 - 37 U/L 23     ALT 0 - 35 U/L 21     Alk Phosphatase 39 - 117 U/L 46     Total Bilirubin 0.2 - 1.2 mg/dL 0.4       Lab Results  Component Value Date/Time   TSH 2.87 12/18/2018 09:23 AM   TSH 1.41 10/30/2017 12:00 AM       Latest Ref Rng & Units 07/30/2020   11:58 AM 07/27/2020    4:35 AM 07/26/2020    2:57 AM  CBC  WBC 4.0 - 10.5 K/uL 5.3  5.2  6.4   Hemoglobin 12.0 - 15.0 g/dL 14.4  13.6  14.0   Hematocrit 36.0 - 46.0 % 42.8  41.1  42.0   Platelets 150.0 - 400.0 K/uL 253.0  223  219     Lab Results  Component Value Date/Time   VD25OH 67 07/21/2017 04:02 PM   VD25OH 43.34 01/23/2017 01:08 PM   VD25OH 54.05 01/14/2016 03:49 PM    Clinical ASCVD: Yes  The ASCVD Risk score (Arnett DK, et al., 2019) failed to calculate for the following reasons:   The patient has a prior MI or stroke diagnosis       12/28/2021    9:03 AM 09/03/2021    8:38 AM 06/29/2021    8:54 AM  Depression screen PHQ 2/9  Decreased Interest _0 Down, Depressed, Hopeless _1 PHQ - 2 Score _2 Altered sleeping _3 Tired, decreased energy _4 Change in appetite 2 0 2  Feeling bad or failure about yourself  _5 Trouble concentrating 1 0 0  Moving slowly or fidgety/restless 0 0 0  Suicidal thoughts 1 0 0  PHQ-9 Score _6 Difficult doing work/chores Somewhat difficult Somewhat difficult Somewhat difficult      Social History   Tobacco Use  Smoking Status Never  Smokeless Tobacco Never   BP Readings from Last 3 Encounters:  12/28/21 120/76  09/18/21 (!) 148/81  09/03/21 126/70   Pulse Readings from Last 3 Encounters:  12/28/21 100  09/18/21 87  09/03/21 90   Wt Readings from Last 3 Encounters:  12/28/21 166 lb 2 oz (75.4 kg)  09/18/21 161 lb (73 kg)  09/03/21 164 lb 2 oz (74.4 kg)  BMI Readings from Last 3 Encounters:  12/28/21 26.02 kg/m  09/18/21 25.22 kg/m  09/03/21 25.71 kg/m    Assessment/Interventions: Review of patient past medical history, allergies, medications, health status, including review of consultants reports, laboratory and other test data, was performed as part of comprehensive evaluation and provision of chronic care management services.   SDOH:  (Social Determinants of Health) assessments and interventions performed: Yes SDOH Interventions    Flowsheet Row Chronic Care Management from 01/14/2022 in [place] at [place] from 12/28/2021 in [place] at [place] from 06/29/2021 in [place] at [place] from 03/31/2021 in [place] at [place] from 02/09/2021 in [place] at [facility] [facility] from 08/31/2020 in [place] at [place]  SDOH Interventions        Depression Interventions/Treatment  -- Currently on Treatment Currently on Treatment Medication Currently on Treatment Currently on Treatment  Financial Strain Interventions Intervention Not Indicated -- -- -- -- --      [place]: No Food Insecurity (02/09/2021)  Housing: [place]  (02/05/2020)  Transportation Needs: No Transportation Needs (02/09/2021)  Depression (PHQ2-9): High Risk (12/28/2021)  Financial Resource Strain: Low Risk  (01/14/2022)  Physical  Activity: Inactive (02/09/2021)  Social Connections: Moderately Integrated (02/05/2020)  Stress: No Stress Concern Present (02/09/2021)  Tobacco Use: Low Risk  (12/28/2021)    CCM Care Plan  No Known Allergies  Medications Reviewed Today     Reviewed by Viona Gilmore, [facility] (Pharmacist) on 01/14/22 at 1049  Med List Status: <None>   Medication Order Taking? Sig Documenting Provider Last Dose Status Informant  Accu-Chek Softclix Lancets lancets [id number]  Use to check blood sugars 1-2 times daily. Dx:E11.65 Alexa Brown, Dayanira G, MD  Active   acetaminophen (TYLENOL) 325 MG tablet [id number]  Take 650 mg by mouth every 6 (six) hours as needed for mild pain, fever or headache. [provider]  Active Child  allopurinol (ZYLOPRIM) 100 MG tablet [id number]  TAKE 1 TABLET BY MOUTH TWICE A DAY Alexa Brown, Kaniesha G, MD  Active   Alpha-Lipoic Acid 600 MG TABS [id number]  Take 600 mg by mouth daily. [provider]  Active Child  apixaban (ELIQUIS) 5 MG TABS tablet [id number]  Take 1 tablet (5 mg total) by mouth 2 (two) times daily. Alexa Brown, Mylin G, MD  Active   b complex vitamins capsule [id number] Yes Take 1 capsule by mouth daily. [provider] Taking Active   Blood Glucose Monitoring Suppl (ACCU-CHEK GUIDE) w/Device KIT [id number]  Use to check blood sugars 1-2 times daily. Dx:E11.65 Alexa Brown, Yesly G, MD  Active   buPROPion ER [facility] SR) 100 MG 12 hr tablet [id number]  TAKE 1 TABLET BY MOUTH TWICE A DAY Alexa Brown, Laurisa G, MD  Active   calcium carbonate (CALCIUM 600) 600 MG TABS tablet [id number]  Take 600 mg by mouth daily. [provider]  Active   DULoxetine (CYMBALTA) 60 MG capsule [id number]  TAKE 1 CAPSULE BY MOUTH EVERY DAY Alexa Brown, Jasmen G, MD  Active   ezetimibe (ZETIA) 10 MG tablet [id number]  Take 1 tablet (10 mg total) by mouth daily. Alexa Brown, Kynzlee G, MD  Active   glucose blood (ACCU-CHEK GUIDE) test strip [id number]  Use to test blood sugars 1-2 times daily.  Dx:e11.65 Alexa Brown, Jazaria G, MD  Active   Homeopathic Products ([place]) [id number]  Apply 1 application topically daily as needed (foot cramps). [provider]  Active Child  ketoconazole (NIZORAL) 2 % cream [id number]  SMARTSIG:1 Application Topical 1 to 2 Times Daily [provider]  Active   L-Lysine 500 MG TABS [id number]  Take 1 tablet by mouth daily. [provider]  Active   Lancet Devices [facility]) lancets [id number]  Use to test blood sugar once a day. Alexa Brown, Cherril G, MD  Active Child  metroNIDAZOLE (METROCREAM) 0.75 % cream [id number]  Apply topically 2 (two) times daily. Apply a thin layer twice a day to a clean dry face. Billie Ruddy, MD  Active   Multiple Vitamin (MULTIVITAMIN) capsule [id number]  Take 1 capsule by mouth daily.  [provider]  Active Child  olmesartan (BENICAR) 20 MG tablet [id number]  Take 1 tablet (20 mg total) by mouth daily. Alexa Brown, Dorrie G, MD  Active   pantoprazole (PROTONIX) 20 MG tablet [id number]  Take 1 tablet (20 mg total) by mouth daily. Alexa Brown, Ellis G, MD  Active   Pitavastatin Calcium (LIVALO) 2 MG TABS [id number]  Take 1 tablet (2 mg total) by mouth daily. Alexa Brown, Iyana G, MD  Active     Discontinued 01/14/22 1049 (Completed Course)             Patient Active Problem List   Diagnosis Date Noted   Atherosclerosis of aorta ([place]) 01/19/2021   (HFpEF) heart failure with preserved ejection fraction ([place]) 07/30/2020   Hx of pulmonary embolus 07/24/2020   Diplopia 04/09/2020   Epiretinal membrane (ERM) of left eye 04/09/2020   Pseudophakia of both eyes 04/09/2020   Myalgia due to statin 02/11/2020   Posterior capsular opacification of both eyes, obscuring vision 10/17/2019   Elbow dislocation, right, initial encounter 07/02/2019   Generalized osteoarthritis of multiple sites 06/26/2018   Incontinent of urine 04/28/2018   Gout, arthropathy 01/22/2018   Lower back pain 09/22/2017   Unstable  gait 09/22/2017   Seborrheic dermatitis 01/23/2017   Seborrheic keratosis 09/27/2016   Falling asleep and waking up too early in the evening 08/19/2016   CKD (chronic kidney disease), stage III ([place]) 05/28/2016   GERD (gastroesophageal reflux disease) 02/25/2016   CVA (cerebral vascular accident) (HCC)-No residual deficit 02/12/2016   Glaucoma 02/12/2016   Depression, major, recurrent ([place]) 02/12/2016   Hyperlipidemia associated with type 2 diabetes mellitus ([place]) 02/12/2016   Vitamin D deficiency 01/14/2016   DM (diabetes mellitus), type 2 with neurological complications ([place]) 14/43/1540   Systolic hypertension with cerebrovascular disease 01/14/2016   Peripheral neuropathy 01/14/2016   Acne rosacea 01/28/2013   Thyroid activity decreased 03/28/2009    Immunization History  Administered Date(s) Administered   Hepatitis B, adult 01/22/2018   Influenza Whole 02/01/2016   Influenza, High Dose Seasonal PF 01/14/2016, 11/17/2018   Influenza-Unspecified 01/12/2017, 12/26/2017, 12/13/2019   PFIZER(Purple Top)SARS-COV-2 Vaccination 05/03/2019, 05/28/2019, 01/11/2020   Pneumococcal Conjugate-13 12/27/2013   Pneumococcal Polysaccharide-23 12/15/2017   Tdap 12/24/2015   Zoster Recombinat (Shingrix) 08/16/2016, 01/12/2017   Patient reports she gets up with dizzy spells and it is happening all throughout the day. Patient reports the dizzy spells started this week. Patient reports her falls came out of the blue in the past and doesn't remember feeling dizzy prior to them.  Patient thinks she is drinking about the same amount of water. She is drinking coffee and orange juice in the morning and wasn't aware that coffee can be dehydrating.  Patient is all squared away with her pharmacy now.   Conditions to be addressed/monitored:  Hypertension, Hyperlipidemia, Diabetes, GERD, Chronic Kidney Disease, Depression, Osteoarthritis  and Gout  Conditions addressed this visit: Hypertension,  hyperlipidemia, depression  Care Plan : CCM Pharmacy Care Plan  Updates made by Viona Gilmore, [place] since 01/14/2022 12:00 AM     Problem: Problem: Hypertension, Hyperlipidemia, Diabetes, GERD, Chronic Kidney Disease, Depression, Osteoarthritis and Gout      Long-Range Goal: Patient-Specific Goal   Start Date: 07/08/2020  Expected End Date: 07/08/2021  Recent Progress: On track  Priority: High  Note:   Current Barriers:  Unable to independently monitor therapeutic efficacy Unable to achieve control of cholesterol  Unable to maintain control of blood pressure  Pharmacist Clinical Goal(s):  Patient will achieve adherence to monitoring guidelines and medication adherence to achieve therapeutic efficacy achieve control of cholesterol as evidenced by next lipid panel maintain control of blood pressure as evidenced by home blood pressure readings  through collaboration with PharmD and provider.   Interventions: 1:1 collaboration with Alexa Brown, Khandi G, MD regarding development and update of comprehensive plan of care as evidenced by provider attestation and co-signature Inter-disciplinary care team collaboration (see longitudinal plan of care) Comprehensive medication review performed; medication list updated in electronic medical record  Hypertension (BP goal <140/90) -Controlled -Current treatment: Olmesartan 20 mg 1 tablet daily - Appropriate, Effective, Safe, Accessible -Medications previously tried: amlodipine (hypotension), benazepril (cough) -Current home readings: 137/54, highest 140 (once daily) -Current dietary habits: mediterranean diet -Current exercise habits: very little exercise lately and has to be in the mood to do  -Denies hypotensive/hypertensive symptoms -Educated on Importance of home blood pressure monitoring; Proper BP monitoring technique; Symptoms of hypotension and importance of maintaining adequate hydration; -Counseled to monitor BP at home a few times  weekly, document, and provide log at future appointments -Counseled on diet and exercise extensively Recommended to continue current medication  Hyperlipidemia/CAD: (LDL goal < 70) -Uncontrolled -Current treatment: Clopidogrel 75 mg 1 tablet daily - Appropriate, Effective, Safe, Accessible Zetia 10 mg 1 tablet daily - Appropriate, Query effective, Safe, Accessible -Medications previously tried: Atorvastatin, pravastatin (foot cramping, fear of side effects) -Current dietary patterns: restarted mediterranean diet; eating lots of vegetables and some fruit; no fried foods and cooking with olive oil -Current exercise habits: minimal right now -Educated on Cholesterol goals;  Importance of limiting foods high in cholesterol; -Counseled on diet and exercise extensively Recommended to continue current medication Recommended repeat lipid panel at next office visit.  Diabetes (A1c goal <7%) -Controlled -Current medications: No medications -Medications previously tried: Invokana  -Current home glucose readings fasting glucose: 127, 140, 137 post prandial glucose: none -Denies hypoglycemic/hyperglycemic symptoms -Current meal patterns:  breakfast: did not discuss  lunch: did not discuss   dinner: did not discuss  snacks: did not discuss  drinks: did not discuss  -Current exercise: PT exercises -Educated on A1c and blood sugar goals; Exercise goal of 150 minutes per week; Benefits of routine self-monitoring of blood sugar; -Counseled to check feet daily and get yearly eye exams -Counseled on diet and exercise extensively  Depression/Anxiety (Goal: minimize symptoms) -Controlled -Current treatment: Wellbutrin 100 mg twice daily - Appropriate, Query effective, Safe, Accessible Cymbalta 60 mg daily - Appropriate, Query effective, Safe, Accessible -Medications previously tried/failed: none -PHQ9: 0 -GAD7: n/a -Educated on Benefits of medication for symptom control Benefits of  cognitive-behavioral therapy with or without medication -Recommended to continue current medication Patient wants to follow up with psychiatrist and not therapist as this did not help in the past.  Insomnia (Goal: improve quality and quantity of sleep) -Controlled -Current treatment  None -Medications  previously tried: melatonin -Recommended to continue current medication  Gout (Goal: uric acid < 6 and prevent flare ups) -Controlled -Current treatment  Allopurinol 100 mg twice daily - Appropriate, Effective, Safe, Accessible Colchicine twice daily as needed - Appropriate, Effective, Safe, Accessible -Medications previously tried: none  -Recommended repeat uric acid level Educated on differences between colchicine and allopurinol and colchicine should be used as needed  Bone health (Goal prevent fractures) -Controlled -Last DEXA Scan: 11/17/16              T-Score femoral neck: -0.5             T-Score total hip: n/a             T-Score lumbar spine: +0.0             T-Score forearm radius: n/a             10-year probability of major osteoporotic fracture: n/a             10-year probability of hip fracture: n/a -Patient is not a candidate for pharmacologic treatment -Current treatment  Calcium Carbonate 600 mg QHS + 800 units of D - Appropriate, Effective, Safe, Accessible -Medications previously tried: none  -Recommend [phone] units of vitamin D daily. Recommend 1200 mg of calcium daily from dietary and supplemental sources. Recommend weight-bearing and muscle strengthening exercises for building and maintaining bone density. -Recommended to continue current medication  GERD (Goal: minimize symptoms) -Controlled -Current treatment  Pantoprazole 20 mg daily - Appropriate, Effective, Safe, Accessible -Medications previously tried: none  -Recommended to continue current medication  PE (Goal: prevent blood clot formation) -Controlled -Current treatment  Eliquis 5 mg 1  tablet twice daily - Appropriate, Effective, Safe, Accessible -Medications previously tried: none  -Recommended to continue current medication   Allergic rhinitis (Goal: minimize symptoms) -Controlled -Current treatment  Mucinex 600 mg 1 tablet daily - Appropriate, Effective, Safe, Accessible -Medications previously tried: none  -Recommended to continue current medication   Health Maintenance -Vaccine gaps: COVID booster (does not want to do) -Current therapy:  Voltaren 1% gel  L-Lysine 1000 mg daily (Cold sores)  Magnesium Citrate 100 mg  Miralax 17 g daily PRN Triamcinolone cream NeuRemedy (Fenfotiamine 300 mg 1 tablet twice daily)  Equate Womens 50+ multivitamin daily Vitamin C 250 mg 2 chewables daily -Educated on Cost vs benefit of each product must be carefully weighed by individual consumer -Patient is satisfied with current therapy and denies issues -Recommended to continue current medication Recommended for patient to stop additional vitamin C, vitamin D and fish oil due to lack of benefit  Patient Goals/Self-Care Activities Patient will:  - take medications as prescribed check glucose daily, document, and provide at future appointments check blood pressure a few times weekly, document, and provide at future appointments target a minimum of 150 minutes of moderate intensity exercise weekly engage in dietary modifications by increasing fiber intake  Follow Up Plan: Telephone follow up appointment with care management team member scheduled for: 6 months      Medication Assistance: None required.  Patient affirms current coverage meets needs.  Compliance/Adherence/Medication fill history: Care Gaps: COVID booster (declines), eye exam Last BP - 120/76 on 12/28/2021 Last A1C - 6.6 on 12/28/2021  Star-Rating Drugs: Livalo 2 mg - last [facility]21 [facility] Olmesartan 20 mg - last filled [facility]/21/2021 [facility]  Patient's preferred pharmacy  is:  [id number] [place] [place]  [place] [address] Phone: [phone] Fax: [place] [address] Phone: [phone] Fax: [phone]   Uses pill box? Yes - daughter fills for her Pt endorses 100% compliance  We discussed: Current pharmacy is preferred with insurance plan and patient is satisfied with pharmacy services Patient decided to: Continue current medication management strategy  Care Plan and Follow Up Patient Decision:  Patient agrees to Care Plan and Follow-up.  Plan: Telephone follow up appointment with care management team member scheduled for:  6 months  [name], PharmD [place] Pharmacist [place] at [place] [phone]

## 2022-01-14 ENCOUNTER — Ambulatory Visit (INDEPENDENT_AMBULATORY_CARE_PROVIDER_SITE_OTHER): Payer: Medicare Other | Admitting: Pharmacist

## 2022-01-14 DIAGNOSIS — I674 Hypertensive encephalopathy: Secondary | ICD-10-CM

## 2022-01-14 DIAGNOSIS — E1149 Type 2 diabetes mellitus with other diabetic neurological complication: Secondary | ICD-10-CM

## 2022-01-14 NOTE — Patient Instructions (Addendum)
Hi Shyan,   It was great to catch up again! Don't forget to pay attention to limiting those sweets as both your A1c and triglycerides went up with your last visit. I attached some more information in case you want to glance at some other foods that can affect your triglycerides.  Please reach out to me if you have any questions or need anything before our follow up!  Best, Maddie  [name], PharmD, [facility]  Visit Information   Goals Addressed   None    Patient Care Plan: CCM Pharmacy Care Plan     Problem Identified: Problem: Hypertension, Hyperlipidemia, Diabetes, GERD, Chronic Kidney Disease, Depression, Osteoarthritis and Gout      Long-Range Goal: Patient-Specific Goal   Start Date: 07/08/2020  Expected End Date: 07/08/2021  Recent Progress: On track  Priority: High  Note:   Current Barriers:  Unable to independently monitor therapeutic efficacy Unable to achieve control of cholesterol  Unable to maintain control of blood pressure  Pharmacist Clinical Goal(s):  Patient will achieve adherence to monitoring guidelines and medication adherence to achieve therapeutic efficacy achieve control of cholesterol as evidenced by next lipid panel maintain control of blood pressure as evidenced by home blood pressure readings  through collaboration with PharmD and provider.   Interventions: 1:1 collaboration with [place], Tejal G, MD regarding development and update of comprehensive plan of care as evidenced by provider attestation and co-signature Inter-disciplinary care team collaboration (see longitudinal plan of care) Comprehensive medication review performed; medication list updated in electronic medical record  Hypertension (BP goal <140/90) -Controlled -Current treatment: Olmesartan 20 mg 1 tablet daily - Appropriate, Effective, Safe, Accessible -Medications previously tried: amlodipine (hypotension),  benazepril (cough) -Current home readings: 137/54, highest 140 (once daily) -Current dietary habits: mediterranean diet -Current exercise habits: very little exercise lately and has to be in the mood to do  -Denies hypotensive/hypertensive symptoms -Educated on Importance of home blood pressure monitoring; Proper BP monitoring technique; Symptoms of hypotension and importance of maintaining adequate hydration; -Counseled to monitor BP at home a few times weekly, document, and provide log at future appointments -Counseled on diet and exercise extensively Recommended to continue current medication  Hyperlipidemia/CAD: (LDL goal < 70) -Uncontrolled -Current treatment: Clopidogrel 75 mg 1 tablet daily - Appropriate, Effective, Safe, Accessible Zetia 10 mg 1 tablet daily - Appropriate, Query effective, Safe, Accessible -Medications previously tried: Atorvastatin, pravastatin (foot cramping, fear of side effects) -Current dietary patterns: restarted mediterranean diet; eating lots of vegetables and some fruit; no fried foods and cooking with olive oil -Current exercise habits: minimal right now -Educated on Cholesterol goals;  Importance of limiting foods high in cholesterol; -Counseled on diet and exercise extensively Recommended to continue current medication Recommended repeat lipid panel at next office visit.  Diabetes (A1c goal <7%) -Controlled -Current medications: No medications -Medications previously tried: Invokana  -Current home glucose readings fasting glucose: 127, 140, 137 post prandial glucose: none -Denies hypoglycemic/hyperglycemic symptoms -Current meal patterns:  breakfast: did not discuss  lunch: did not discuss   dinner: did not discuss  snacks: did not discuss  drinks: did not discuss  -Current exercise: PT exercises -Educated on A1c and blood sugar goals; Exercise goal of 150 minutes per week; Benefits of routine self-monitoring of blood sugar; -Counseled  to check feet daily and get yearly eye exams -Counseled on diet and exercise extensively  Depression/Anxiety (Goal: minimize symptoms) -Controlled -Current treatment: Wellbutrin 100 mg twice daily - Appropriate, Query  effective, Safe, Accessible Cymbalta 60 mg daily - Appropriate, Query effective, Safe, Accessible -Medications previously tried/failed: none -PHQ9: 0 -GAD7: n/a -Educated on Benefits of medication for symptom control Benefits of cognitive-behavioral therapy with or without medication -Recommended to continue current medication Patient wants to follow up with psychiatrist and not therapist as this did not help in the past.  Insomnia (Goal: improve quality and quantity of sleep) -Controlled -Current treatment  None -Medications previously tried: melatonin -Recommended to continue current medication  Gout (Goal: uric acid < 6 and prevent flare ups) -Controlled -Current treatment  Allopurinol 100 mg twice daily - Appropriate, Effective, Safe, Accessible Colchicine twice daily as needed - Appropriate, Effective, Safe, Accessible -Medications previously tried: none  -Recommended repeat uric acid level Educated on differences between colchicine and allopurinol and colchicine should be used as needed  Bone health (Goal prevent fractures) -Controlled -Last DEXA Scan: 11/17/16              T-Score femoral neck: -0.5             T-Score total hip: n/a             T-Score lumbar spine: +0.0             T-Score forearm radius: n/a             10-year probability of major osteoporotic fracture: n/a             10-year probability of hip fracture: n/a -Patient is not a candidate for pharmacologic treatment -Current treatment  Calcium Carbonate 600 mg QHS + 800 units of D - Appropriate, Effective, Safe, Accessible -Medications previously tried: none  -Recommend [id number] units of vitamin D daily. Recommend 1200 mg of calcium daily from dietary and supplemental sources.  Recommend weight-bearing and muscle strengthening exercises for building and maintaining bone density. -Recommended to continue current medication  GERD (Goal: minimize symptoms) -Controlled -Current treatment  Pantoprazole 20 mg daily - Appropriate, Effective, Safe, Accessible -Medications previously tried: none  -Recommended to continue current medication  PE (Goal: prevent blood clot formation) -Controlled -Current treatment  Eliquis 5 mg 1 tablet twice daily - Appropriate, Effective, Safe, Accessible -Medications previously tried: none  -Recommended to continue current medication   Allergic rhinitis (Goal: minimize symptoms) -Controlled -Current treatment  Mucinex 600 mg 1 tablet daily - Appropriate, Effective, Safe, Accessible -Medications previously tried: none  -Recommended to continue current medication   Health Maintenance -Vaccine gaps: COVID booster (does not want to do) -Current therapy:  Voltaren 1% gel  L-Lysine 1000 mg daily (Cold sores)  Magnesium Citrate 100 mg  Miralax 17 g daily PRN Triamcinolone cream NeuRemedy (Fenfotiamine 300 mg 1 tablet twice daily)  Equate Womens 50+ multivitamin daily Vitamin C 250 mg 2 chewables daily -Educated on Cost vs benefit of each product must be carefully weighed by individual consumer -Patient is satisfied with current therapy and denies issues -Recommended to continue current medication Recommended for patient to stop additional vitamin C, vitamin D and fish oil due to lack of benefit  Patient Goals/Self-Care Activities Patient will:  - take medications as prescribed check glucose daily, document, and provide at future appointments check blood pressure a few times weekly, document, and provide at future appointments target a minimum of 150 minutes of moderate intensity exercise weekly engage in dietary modifications by increasing fiber intake  Follow Up Plan: Telephone follow up appointment with care management  team member scheduled for: 6 months       Patient  verbalizes understanding of instructions and care plan provided today and agrees to view in [facility]. Active MyChart status and patient understanding of how to access instructions and care plan via MyChart confirmed with patient.    Telephone follow up appointment with pharmacy team member scheduled for: 6 months  Viona Gilmore, [facility]

## 2022-01-25 DIAGNOSIS — I10 Essential (primary) hypertension: Secondary | ICD-10-CM | POA: Diagnosis not present

## 2022-01-25 DIAGNOSIS — E1159 Type 2 diabetes mellitus with other circulatory complications: Secondary | ICD-10-CM

## 2022-01-25 DIAGNOSIS — F32A Depression, unspecified: Secondary | ICD-10-CM | POA: Diagnosis not present

## 2022-01-25 DIAGNOSIS — E785 Hyperlipidemia, unspecified: Secondary | ICD-10-CM | POA: Diagnosis not present

## 2022-01-25 DIAGNOSIS — I2510 Atherosclerotic heart disease of native coronary artery without angina pectoris: Secondary | ICD-10-CM | POA: Diagnosis not present

## 2022-01-27 DIAGNOSIS — R0982 Postnasal drip: Secondary | ICD-10-CM | POA: Diagnosis not present

## 2022-01-27 DIAGNOSIS — J343 Hypertrophy of nasal turbinates: Secondary | ICD-10-CM | POA: Diagnosis not present

## 2022-01-27 DIAGNOSIS — J310 Chronic rhinitis: Secondary | ICD-10-CM | POA: Diagnosis not present

## 2022-02-07 NOTE — Progress Notes (Signed)
Appointment cancelled - please disregard. 

## 2022-02-15 ENCOUNTER — Ambulatory Visit: Payer: Medicare Other

## 2022-02-16 ENCOUNTER — Ambulatory Visit (INDEPENDENT_AMBULATORY_CARE_PROVIDER_SITE_OTHER): Payer: Medicare Other

## 2022-02-16 VITALS — BP 124/62 | HR 86 | Temp 98.1°F | Ht 67.0 in | Wt 168.7 lb

## 2022-02-16 DIAGNOSIS — Z0000 Encounter for general adult medical examination without abnormal findings: Secondary | ICD-10-CM | POA: Diagnosis not present

## 2022-02-16 NOTE — Progress Notes (Signed)
Subjective:   Alexa Brown is a 81 y.o. female who presents for Medicare Annual (Subsequent) preventive examination.  Review of Systems     Cardiac Risk Factors include: advanced age (>66mn, >>37women);hypertension     Objective:    Today's Vitals   02/16/22 1308  BP: 124/62  Pulse: 86  Temp: 98.1 F (36.7 C)  TempSrc: Oral  SpO2: 94%  Weight: 168 lb 11.2 oz (76.5 kg)  Height: _0  (1.702 m)   Body mass index is 26.42 kg/m.     02/16/2022    1:34 PM 09/18/2021    4:44 PM 08/26/2021    9:54 AM 02/09/2021    2:17 PM 07/24/2020    1:40 PM 02/05/2020    8:57 AM 01/30/2018    1:47 PM  Advanced Directives  Does Patient Have a Medical Advance Directive? No No Yes No No No No  Does patient want to make changes to medical advance directive?   No - Patient declined      Would patient like information on creating a medical advance directive? No - Patient declined    No - Patient declined  No - Patient declined    Current Medications (verified) Outpatient Encounter Medications as of 02/16/2022  Medication Sig   Accu-Chek Softclix Lancets lancets Use to check blood sugars 1-2 times daily. Dx:E11.65   acetaminophen (TYLENOL) 325 MG tablet Take 650 mg by mouth every 6 (six) hours as needed for mild pain, fever or headache.   allopurinol (ZYLOPRIM) 100 MG tablet TAKE 1 TABLET BY MOUTH TWICE A DAY   Alpha-Lipoic Acid 600 MG TABS Take 600 mg by mouth daily.   apixaban (ELIQUIS) 5 MG TABS tablet Take 1 tablet (5 mg total) by mouth 2 (two) times daily.   b complex vitamins capsule Take 1 capsule by mouth daily.   Blood Glucose Monitoring Suppl (ACCU-CHEK GUIDE) w/Device KIT Use to check blood sugars 1-2 times daily. Dx:E11.65   buPROPion ER (WELLBUTRIN SR) 100 MG 12 hr tablet TAKE 1 TABLET BY MOUTH TWICE A DAY   calcium carbonate (CALCIUM 600) 600 MG TABS tablet Take 600 mg by mouth daily.   DULoxetine (CYMBALTA) 60 MG capsule TAKE 1 CAPSULE BY MOUTH EVERY DAY   ezetimibe  (ZETIA) 10 MG tablet Take 1 tablet (10 mg total) by mouth daily.   glucose blood (ACCU-CHEK GUIDE) test strip Use to test blood sugars 1-2 times daily. Dx:e11.65   Homeopathic Products (THERAWORX RELIEF EX) Apply 1 application topically daily as needed (foot cramps).   ketoconazole (NIZORAL) 2 % cream SMARTSIG:1 Application Topical 1 to 2 Times Daily   L-Lysine 500 MG TABS Take 1 tablet by mouth daily.   Lancet Devices (ACCU-CHEK SOFTCLIX) lancets Use to test blood sugar once a day.   metroNIDAZOLE (METROCREAM) 0.75 % cream Apply topically 2 (two) times daily. Apply a thin layer twice a day to a clean dry face.   Multiple Vitamin (MULTIVITAMIN) capsule Take 1 capsule by mouth daily.    olmesartan (BENICAR) 20 MG tablet Take 1 tablet (20 mg total) by mouth daily.   pantoprazole (PROTONIX) 20 MG tablet Take 1 tablet (20 mg total) by mouth daily.   Pitavastatin Calcium (LIVALO) 2 MG TABS Take 1 tablet (2 mg total) by mouth daily.   No facility-administered encounter medications on file as of 02/16/2022.    Allergies (verified) Patient has no known allergies.   History: Past Medical History:  Diagnosis Date   Chicken pox  Depression    Diabetes mellitus without complication ([place])    GERD (gastroesophageal reflux disease)    Hyperlipidemia    Hypertension    Stroke [facility])    UTI (urinary tract infection)    Past Surgical History:  Procedure Laterality Date   ABDOMINAL HYSTERECTOMY     APPENDECTOMY     BREAST BIOPSY Left 2014   BREAST SURGERY     biopsy   Family History  Problem Relation Age of Onset   Hyperlipidemia Mother    Heart disease Mother    Stroke Mother    Hypertension Mother    Hyperlipidemia Father    Heart disease Father    Stroke Father    Hypertension Father    Asthma Father    Parkinson's disease Sister    Social History   Socioeconomic History   Marital status: Widowed    Spouse name: Not on file   Number of children: Not on file   Years of  education: Not on file   Highest education level: Not on file  Occupational History   Occupation: N/A  Tobacco Use   Smoking status: Never   Smokeless tobacco: Never  Vaping Use   Vaping Use: Never used  Substance and Sexual Activity   Alcohol use: Yes    Comment: occassional wine   Drug use: No   Sexual activity: Not Currently  Other Topics Concern   Not on file  Social History Narrative   Pt lives in single story home with her daughter   Has 2 children   Some college education   Retired Engineer, production for [employer]   Social Determinants of Health   Financial Resource Strain: [place]  (02/16/2022)   Overall Financial Resource Strain (CARDIA)    Difficulty of Paying Living Expenses: Not hard at all  Food Insecurity: No Food Insecurity (02/16/2022)   Hunger Vital Sign    Worried About Running Out of Food in the Last Year: Never true    [place] in the Last Year: Never true  Transportation Needs: No Transportation Needs (02/16/2022)   PRAPARE - Hydrologist (Medical): No    Lack of Transportation (Non-Medical): No  Physical Activity: Inactive (02/16/2022)   Exercise Vital Sign    Days of Exercise per Week: 0 days    Minutes of Exercise per Session: 0 min  Stress: No Stress Concern Present (02/16/2022)   [place]    Feeling of Stress : Not at all  Social Connections: Moderately Integrated (02/16/2022)   Social Connection and Isolation Panel [NHANES]    Frequency of Communication with Friends and Family: More than three times a week    Frequency of Social Gatherings with Friends and Family: More than three times a week    Attends Religious Services: More than 4 times per year    Active Member of [employer] or Organizations: Yes    Attends Archivist Meetings: More than 4 times per year    Marital Status: Widowed    Tobacco Counseling Counseling  given: Not Answered   Clinical Intake:  Pre-visit preparation completed: No  Pain : No/denies pain     BMI - recorded: 26.42 Nutritional Status: BMI 25 -29 Overweight Nutritional Risks: None Diabetes: No  How often do you need to have someone help you when you read instructions, pamphlets, or other written materials from your doctor or pharmacy?: 1 - Never  Diabetic?  No  Interpreter Needed?: No  Information entered by :: Rolene Arbour LPN   Activities of Daily Living    02/16/2022    1:27 PM  In your present state of health, do you have any difficulty performing the following activities:  Hearing? 0  Vision? 0  Difficulty concentrating or making decisions? 0  Walking or climbing stairs? 1  Comment Uses walker and cane  Dressing or bathing? 0  Doing errands, shopping? 0  Preparing Food and eating ? N  Using the Toilet? N  In the past six months, have you accidently leaked urine? Y  Comment [place] wears breifs followed by Urologist  Do you have problems with loss of bowel control? Y  Comment [place] wears breifs followed by PCP  Managing your Medications? N  Managing your Finances? N  Housekeeping or managing your Housekeeping? N    Patient Care Team: [place], Charrie G, MD as PCP - General (Family Medicine) Viona Gilmore, [facility] as Pharmacist (Pharmacist)  Indicate any recent Medical Services you may have received from other than [facility] providers in the past year (date may be approximate).     Assessment:   This is a routine wellness examination for [place].  Hearing/Vision screen Hearing Screening - Comments:: Denies hearing difficulties   Vision Screening - Comments:: Wears reading glasses - up to date with routine eye exams with  Dr Katy Fitch  Dietary issues and exercise activities discussed: Exercise limited by: None identified   Goals Addressed               This Visit's Progress     Patient stated (pt-stated)        I would like to become  more independent.       Depression Screen    02/16/2022    1:25 PM 12/28/2021    9:03 AM 09/03/2021    8:38 AM 06/29/2021    8:54 AM 03/31/2021   10:04 AM 02/09/2021    2:21 PM 08/31/2020   12:35 PM  PHQ 2/9 Scores  PHQ - 2 Score 0 _0 PHQ- 9 Score 0 _1 Fall Risk    02/16/2022    1:31 PM 12/28/2021    9:03 AM 09/03/2021    8:38 AM 06/29/2021    8:54 AM 03/31/2021   10:04 AM  Fall Risk   Falls in the past year? _2 Number falls in past yr: _3 Injury with Fall? _4 0  Comment Dislocated rt elbow, followed by medical attention/Orthopedic      Risk for fall due to : Impaired balance/gait History of fall(s) History of fall(s) History of fall(s)   Follow up Falls prevention discussed Falls evaluation completed;Education provided Falls evaluation completed Falls evaluation completed     FALL RISK PREVENTION PERTAINING TO THE HOME:  Any stairs in or around the home? No  If so, are there any without handrails? No  Home free of loose throw rugs in walkways, pet beds, electrical cords, etc? Yes  Adequate lighting in your home to reduce risk of falls? Yes   ASSISTIVE DEVICES UTILIZED TO PREVENT FALLS:  Life alert? Yes  Use of a cane, walker or w/c? Yes  Grab bars in the bathroom? No  Shower chair or bench in shower? Yes  Elevated toilet seat or a handicapped toilet? Yes   TIMED UP AND  GO:  Was the test performed? Yes .  Length of time to ambulate 10 feet: 10 sec.   Gait slow and steady with assistive device  Cognitive Function:    10/30/2017    9:00 AM  MMSE - Mini Mental State Exam  Orientation to time 5  Orientation to Place 5  Registration 3  Attention/ Calculation 5  Recall 2  Language- name 2 objects 2  Language- repeat 1  Language- follow 3 step command 3  Language- read & follow direction 1  Write a sentence 1  Copy design 1  Total score 29        02/16/2022    1:35 PM 02/09/2021    2:28 PM 02/05/2020    9:21 AM   6CIT Screen  What Year? 0 points 0 points 0 points  What month? 0 points 0 points 0 points  What time? 0 points 0 points   Count back from 20 0 points 0 points 0 points  Months in reverse 0 points 0 points 0 points  Repeat phrase 0 points 10 points 0 points  Total Score 0 points 10 points     Immunizations Immunization History  Administered Date(s) Administered   Hepatitis B, adult 01/22/2018   Influenza Whole 02/01/2016   Influenza, High Dose Seasonal PF 01/14/2016, 11/17/2018   Influenza-Unspecified 01/12/2017, 12/26/2017, 12/13/2019   PFIZER(Purple Top)SARS-COV-2 Vaccination 05/03/2019, 05/28/2019, 01/11/2020   Pneumococcal Conjugate-13 12/27/2013   Pneumococcal Polysaccharide-23 12/15/2017   Tdap 12/24/2015   Zoster Recombinat (Shingrix) 08/16/2016, 01/12/2017      Flu Vaccine status: Up to date  Pneumococcal vaccine status: Up to date  Covid-19 vaccine status: Completed vaccines  Qualifies for Shingles Vaccine? Yes   Zostavax completed Yes   Shingrix Completed?: Yes  Screening Tests Health Maintenance  Topic Date Due   OPHTHALMOLOGY EXAM  09/21/2021   COVID-19 Vaccine (4 - 2023-24 season) 03/04/2022 (Originally 11/26/2021)   HEMOGLOBIN A1C  06/29/2022   Diabetic kidney evaluation - Urine ACR  06/30/2022   FOOT EXAM  08/18/2022   Diabetic kidney evaluation - GFR measurement  12/29/2022   Medicare Annual Wellness (AWV)  02/17/2023   Pneumonia Vaccine 18+ Years old  Completed   INFLUENZA VACCINE  Completed   DEXA SCAN  Completed   Hepatitis C Screening  Completed   Zoster Vaccines- Shingrix  Completed   HPV VACCINES  Aged Out    Health Maintenance  Health Maintenance Due  Topic Date Due   OPHTHALMOLOGY EXAM  09/21/2021    Colorectal cancer screening: No longer required.   Mammogram status: No longer required due to Age.  Bone Density status: Completed 11/17/16. Results reflect: Bone density results: OSTEOPOROSIS. Repeat every   years.  Lung Cancer  Screening: (Low Dose CT Chest recommended if Age 4-80 years, 30 pack-year currently smoking OR have quit w/in 15years.) does not qualify.     Additional Screening:  Hepatitis C Screening: does qualify; Completed 07/14/20  Vision Screening: Recommended annual ophthalmology exams for early detection of glaucoma and other disorders of the eye. Is the patient up to date with their annual eye exam?  Yes  Who is the provider or what is the name of the office in which the patient attends annual eye exams? Dr Katy Fitch If pt is not established with a provider, would they like to be referred to a provider to establish care? No .   Dental Screening: Recommended annual dental exams for proper oral hygiene  Community Resource Referral / Chronic Care Management:  CRR required this visit?  No   CCM required this visit?  No      Plan:     I have personally reviewed and noted the following in the patient's chart:   Medical and social history Use of alcohol, tobacco or illicit drugs  Current medications and supplements including opioid prescriptions. Patient is not currently taking opioid prescriptions. Functional ability and status Nutritional status Physical activity Advanced directives List of other physicians Hospitalizations, surgeries, and ER visits in previous 12 months Vitals Screenings to include cognitive, depression, and falls Referrals and appointments  In addition, I have reviewed and discussed with patient certain preventive protocols, quality metrics, and best practice recommendations. A written personalized care plan for preventive services as well as general preventive health recommendations were provided to patient.     Criselda Peaches, LPN   [id number]   Nurse Notes: None

## 2022-02-16 NOTE — Patient Instructions (Addendum)
Ms. Alexa Brown , Thank you for taking time to come for your Medicare Wellness Visit. I appreciate your ongoing commitment to your health goals. Please review the following plan we discussed and let me know if I can assist you in the future.   These are the goals we discussed:  Goals       Patient Stated      Will get some counseling to help with depression. Continue walking daily, 20-30 min. Fall prevention.       Patient Stated      Stay healthy      Patient Stated      02/09/2021, lose a few pounds, work on being less depressed      Patient stated (pt-stated)      I would like to become more independent.      Pt wants to complete appts for dermatologist and podiatrist.         This is a list of the screening recommended for you and due dates:  Health Maintenance  Topic Date Due   Eye exam for diabetics  09/21/2021   COVID-19 Vaccine (4 - 2023-24 season) 03/04/2022*   Hemoglobin A1C  06/29/2022   Yearly kidney health urinalysis for diabetes  06/30/2022   Complete foot exam   08/18/2022   Yearly kidney function blood test for diabetes  12/29/2022   Medicare Annual Wellness Visit  02/17/2023   Pneumonia Vaccine  Completed   Flu Shot  Completed   DEXA scan (bone density measurement)  Completed   Hepatitis C Screening: USPSTF Recommendation to screen - Ages 84-79 yo.  Completed   Zoster (Shingles) Vaccine  Completed   HPV Vaccine  Aged Out  *Topic was postponed. The date shown is not the original due date.    Advanced directives: Advance directive discussed with you today. Even though you declined this today, please call our office should you change your mind, and we can give you the proper paperwork for you to fill out.   Conditions/risks identified: None  Next appointment: Follow up in one year for your annual wellness visit    Preventive Care 65 Years and Older, Female Preventive care refers to lifestyle choices and visits with your health care provider that  can promote health and wellness. What does preventive care include? A yearly physical exam. This is also called an annual well check. Dental exams once or twice a year. Routine eye exams. Ask your health care provider how often you should have your eyes checked. Personal lifestyle choices, including: Daily care of your teeth and gums. Regular physical activity. Eating a healthy diet. Avoiding tobacco and drug use. Limiting alcohol use. Practicing safe sex. Taking low-dose aspirin every day. Taking vitamin and mineral supplements as recommended by your health care provider. What happens during an annual well check? The services and screenings done by your health care provider during your annual well check will depend on your age, overall health, lifestyle risk factors, and family history of disease. Counseling  Your health care provider may ask you questions about your: Alcohol use. Tobacco use. Drug use. Emotional well-being. Home and relationship well-being. Sexual activity. Eating habits. History of falls. Memory and ability to understand (cognition). Work and work Statistician. Reproductive health. Screening  You may have the following tests or measurements: Height, weight, and BMI. Blood pressure. Lipid and cholesterol levels. These may be checked every 5 years, or more frequently if you are over 84 years old. Skin check. Lung cancer screening. You may  have this screening every year starting at age 98 if you have a 30-pack-year history of smoking and currently smoke or have quit within the past 15 years. Fecal occult blood test (FOBT) of the stool. You may have this test every year starting at age 56. Flexible sigmoidoscopy or colonoscopy. You may have a sigmoidoscopy every 5 years or a colonoscopy every 10 years starting at age 80. Hepatitis C blood test. Hepatitis B blood test. Sexually transmitted disease (STD) testing. Diabetes screening. This is done by checking your  blood sugar (glucose) after you have not eaten for a while (fasting). You may have this done every 1-3 years. Bone density scan. This is done to screen for osteoporosis. You may have this done starting at age 31. Mammogram. This may be done every 1-2 years. Talk to your health care provider about how often you should have regular mammograms. Talk with your health care provider about your test results, treatment options, and if necessary, the need for more tests. Vaccines  Your health care provider may recommend certain vaccines, such as: Influenza vaccine. This is recommended every year. Tetanus, diphtheria, and acellular pertussis (Tdap, Td) vaccine. You may need a Td booster every 10 years. Zoster vaccine. You may need this after age 1. Pneumococcal 13-valent conjugate (PCV13) vaccine. One dose is recommended after age 29. Pneumococcal polysaccharide (PPSV23) vaccine. One dose is recommended after age 45. Talk to your health care provider about which screenings and vaccines you need and how often you need them. This information is not intended to replace advice given to you by your health care provider. Make sure you discuss any questions you have with your health care provider. Document Released: 04/10/2015 Document Revised: 12/02/2015 Document Reviewed: 01/13/2015 Elsevier Interactive Patient Education  2017 [employer] Prevention in the Home Falls can cause injuries. They can happen to people of all ages. There are many things you can do to make your home safe and to help prevent falls. What can I do on the outside of my home? Regularly fix the edges of walkways and driveways and fix any cracks. Remove anything that might make you trip as you walk through a door, such as a raised step or threshold. Trim any bushes or trees on the path to your home. Use bright outdoor lighting. Clear any walking paths of anything that might make someone trip, such as rocks or tools. Regularly  check to see if handrails are loose or broken. Make sure that both sides of any steps have handrails. Any raised decks and porches should have guardrails on the edges. Have any leaves, snow, or ice cleared regularly. Use sand or salt on walking paths during winter. Clean up any spills in your garage right away. This includes oil or grease spills. What can I do in the bathroom? Use night lights. Install grab bars by the toilet and in the tub and shower. Do not use towel bars as grab bars. Use non-skid mats or decals in the tub or shower. If you need to sit down in the shower, use a plastic, non-slip stool. Keep the floor dry. Clean up any water that spills on the floor as soon as it happens. Remove soap buildup in the tub or shower regularly. Attach bath mats securely with double-sided non-slip rug tape. Do not have throw rugs and other things on the floor that can make you trip. What can I do in the bedroom? Use night lights. Make sure that you have a light  by your bed that is easy to reach. Do not use any sheets or blankets that are too big for your bed. They should not hang down onto the floor. Have a firm chair that has side arms. You can use this for support while you get dressed. Do not have throw rugs and other things on the floor that can make you trip. What can I do in the kitchen? Clean up any spills right away. Avoid walking on wet floors. Keep items that you use a lot in easy-to-reach places. If you need to reach something above you, use a strong step stool that has a grab bar. Keep electrical cords out of the way. Do not use floor polish or wax that makes floors slippery. If you must use wax, use non-skid floor wax. Do not have throw rugs and other things on the floor that can make you trip. What can I do with my stairs? Do not leave any items on the stairs. Make sure that there are handrails on both sides of the stairs and use them. Fix handrails that are broken or loose.  Make sure that handrails are as long as the stairways. Check any carpeting to make sure that it is firmly attached to the stairs. Fix any carpet that is loose or worn. Avoid having throw rugs at the top or bottom of the stairs. If you do have throw rugs, attach them to the floor with carpet tape. Make sure that you have a light switch at the top of the stairs and the bottom of the stairs. If you do not have them, ask someone to add them for you. What else can I do to help prevent falls? Wear shoes that: Do not have high heels. Have rubber bottoms. Are comfortable and fit you well. Are closed at the toe. Do not wear sandals. If you use a stepladder: Make sure that it is fully opened. Do not climb a closed stepladder. Make sure that both sides of the stepladder are locked into place. Ask someone to hold it for you, if possible. Clearly mark and make sure that you can see: Any grab bars or handrails. First and last steps. Where the edge of each step is. Use tools that help you move around (mobility aids) if they are needed. These include: Canes. Walkers. Scooters. Crutches. Turn on the lights when you go into a dark area. Replace any light bulbs as soon as they burn out. Set up your furniture so you have a clear path. Avoid moving your furniture around. If any of your floors are uneven, fix them. If there are any pets around you, be aware of where they are. Review your medicines with your doctor. Some medicines can make you feel dizzy. This can increase your chance of falling. Ask your doctor what other things that you can do to help prevent falls. This information is not intended to replace advice given to you by your health care provider. Make sure you discuss any questions you have with your health care provider. Document Released: 01/08/2009 Document Revised: 08/20/2015 Document Reviewed: 04/18/2014 Elsevier Interactive Patient Education  2017 [employer].

## 2022-02-23 DIAGNOSIS — R350 Frequency of micturition: Secondary | ICD-10-CM | POA: Diagnosis not present

## 2022-02-28 ENCOUNTER — Encounter: Payer: Self-pay | Admitting: Podiatry

## 2022-02-28 ENCOUNTER — Ambulatory Visit (INDEPENDENT_AMBULATORY_CARE_PROVIDER_SITE_OTHER): Payer: Medicare Other | Admitting: Podiatry

## 2022-02-28 VITALS — BP 131/68

## 2022-02-28 DIAGNOSIS — M79674 Pain in right toe(s): Secondary | ICD-10-CM

## 2022-02-28 DIAGNOSIS — B351 Tinea unguium: Secondary | ICD-10-CM | POA: Diagnosis not present

## 2022-02-28 DIAGNOSIS — E1142 Type 2 diabetes mellitus with diabetic polyneuropathy: Secondary | ICD-10-CM

## 2022-02-28 DIAGNOSIS — M79675 Pain in left toe(s): Secondary | ICD-10-CM

## 2022-02-28 NOTE — Progress Notes (Signed)
  Subjective:  Patient ID: Alexa Brown, female    DOB: [birth year],  MRN: [id number]  Alexa Brown presents to clinic today for at risk foot care with history of diabetic neuropathy and painful thick toenails that are difficult to trim. Pain interferes with ambulation. Aggravating factors include wearing enclosed shoe gear. Pain is relieved with periodic professional debridement.  Chief Complaint  Patient presents with   Nail Problem    Prediabetic foot care BS- has not checked yet A1C-do not remember  PCP-Alexa Brown PCP [facility]- 2 Months ago   New problem(s): None.   PCP is Brown, Itsel G, MD.  No Known Allergies  Review of Systems: Negative except as noted in the HPI.  Objective:  Vitals:   02/28/22 1607  BP: 131/68   Alexa Brown is a pleasant 81 y.o. female WD, WN in NAD. AAO x 3. Vascular CFT <3 seconds b/l LE. Palpable DP/PT pulses b/l LE. Digital hair absent b/l. Skin temperature gradient WNL b/l. No pain with calf compression b/l. No edema noted b/l. No cyanosis or clubbing noted b/l LE.  Neurologic Normal speech. Oriented to person, place, and time. Pt has subjective symptoms of neuropathy. Protective sensation intact 5/5 intact bilaterally with 10g monofilament b/l. Vibratory sensation intact b/l.  Dermatologic Pedal integument with normal turgor, texture and tone b/l LE. No open wounds b/l. No interdigital macerations b/l. Toenails 1-5 b/l elongated, thickened, discolored with subungual debris. +Tenderness with dorsal palpation of nailplates.   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  Orthopedic: Muscle strength 5/5 to all lower extremity muscle groups bilaterally. HAV with bunion deformity noted b/l LE. Utilizes cane for ambulation assistance.   Radiographs: None Assessment/Plan: 1. Pain due to onychomycosis of toenails of both feet   2. Diabetic peripheral neuropathy associated with type 2 diabetes mellitus  ([place])     No orders of the defined types were placed in this encounter.   -Patient was evaluated and treated. All patient's and/or POA's questions/concerns answered on today's visit. -Continue supportive shoe gear daily. -Mycotic toenails 1-5 bilaterally were debrided in length and girth with sterile nail nippers and dremel without incident. -Patient/POA to call should there be question/concern in the interim.   Return in about 3 months (around 05/30/2022).  Alexa Brown, DPM

## 2022-03-14 ENCOUNTER — Telehealth: Payer: Self-pay | Admitting: Family Medicine

## 2022-03-14 DIAGNOSIS — R296 Repeated falls: Secondary | ICD-10-CM

## 2022-03-14 NOTE — Telephone Encounter (Signed)
Pt's daughter called requesting references &/or information regarding [facility].  Please advise.

## 2022-03-14 NOTE — Telephone Encounter (Signed)
I called and spoke with patient's daughter. She is asking for PT to help patient regain her strength due to falls. Referral placed to home health. Phone number given to pt's daughter for [facility] to help with other things that [facility] will not be able to do.

## 2022-03-18 DIAGNOSIS — N3281 Overactive bladder: Secondary | ICD-10-CM | POA: Diagnosis not present

## 2022-04-05 ENCOUNTER — Telehealth: Payer: Self-pay | Admitting: Pharmacist

## 2022-04-05 NOTE — Progress Notes (Unsigned)
Care Management & Coordination Services Pharmacy Team  Name: Alexa Brown  MRN: [id number] DOB: 1940-06-16  Reason for Encounter: Hypertension  Contacted patient on *** to discuss hypertension disease state.   Recent office visits:  02/16/2022 Rolene Arbour LPN - Medicare annual wellness exam   Recent consult visits:  02/28/2022 Acquanetta Sit DPM  - Patient was seen for Pain due to onychomycosis of toenails of both feet and an additional concern. No medication changes.   Hospital visits:  None  Medications: Outpatient Encounter Medications as of 04/05/2022  Medication Sig   Accu-Chek Softclix Lancets lancets Use to check blood sugars 1-2 times daily. Dx:E11.65   acetaminophen (TYLENOL) 325 MG tablet Take 650 mg by mouth every 6 (six) hours as needed for mild pain, fever or headache.   allopurinol (ZYLOPRIM) 100 MG tablet TAKE 1 TABLET BY MOUTH TWICE A DAY   Alpha-Lipoic Acid 600 MG TABS Take 600 mg by mouth daily.   apixaban (ELIQUIS) 5 MG TABS tablet Take 1 tablet (5 mg total) by mouth 2 (two) times daily.   b complex vitamins capsule Take 1 capsule by mouth daily.   Blood Glucose Monitoring Suppl (ACCU-CHEK GUIDE) w/Device KIT Use to check blood sugars 1-2 times daily. Dx:E11.65   buPROPion ER (WELLBUTRIN SR) 100 MG 12 hr tablet TAKE 1 TABLET BY MOUTH TWICE A DAY   calcium carbonate (CALCIUM 600) 600 MG TABS tablet Take 600 mg by mouth daily.   DULoxetine (CYMBALTA) 60 MG capsule TAKE 1 CAPSULE BY MOUTH EVERY DAY   ezetimibe (ZETIA) 10 MG tablet Take 1 tablet (10 mg total) by mouth daily.   glucose blood (ACCU-CHEK GUIDE) test strip Use to test blood sugars 1-2 times daily. Dx:e11.65   Homeopathic Products (THERAWORX RELIEF EX) Apply 1 application topically daily as needed (foot cramps).   ketoconazole (NIZORAL) 2 % cream SMARTSIG:1 Application Topical 1 to 2 Times Daily   L-Lysine 500 MG TABS Take 1 tablet by mouth daily.   Lancet Devices (ACCU-CHEK SOFTCLIX) lancets  Use to test blood sugar once a day.   metroNIDAZOLE (METROCREAM) 0.75 % cream Apply topically 2 (two) times daily. Apply a thin layer twice a day to a clean dry face.   Multiple Vitamin (MULTIVITAMIN) capsule Take 1 capsule by mouth daily.    olmesartan (BENICAR) 20 MG tablet Take 1 tablet (20 mg total) by mouth daily.   pantoprazole (PROTONIX) 20 MG tablet Take 1 tablet (20 mg total) by mouth daily.   Pitavastatin Calcium (LIVALO) 2 MG TABS Take 1 tablet (2 mg total) by mouth daily.   No facility-administered encounter medications on file as of 04/05/2022.    Recent Office Vitals: BP Readings from Last 3 Encounters:  02/28/22 131/68  02/16/22 124/62  12/28/21 120/76   Pulse Readings from Last 3 Encounters:  02/16/22 86  12/28/21 100  09/18/21 87    Wt Readings from Last 3 Encounters:  02/16/22 168 lb 11.2 oz (76.5 kg)  12/28/21 166 lb 2 oz (75.4 kg)  09/18/21 161 lb (73 kg)     Kidney Function Lab Results  Component Value Date/Time   CREATININE 1.10 12/28/2021 09:41 AM   CREATININE 1.08 06/29/2021 09:23 AM   CREATININE 1.00 (H) 02/11/2020 09:05 AM   CREATININE 0.92 07/21/2017 04:02 PM   GFR 47.79 (L) 12/28/2021 09:41 AM   GFRNONAA >60 07/27/2020 04:35 AM   GFRNONAA 54 (L) 02/11/2020 09:05 AM   GFRAA 63 02/11/2020 09:05 AM       Latest  Ref Rng & Units 12/28/2021    9:41 AM 06/29/2021    9:23 AM 01/19/2021    3:30 PM  BMP  Glucose 70 - 99 mg/dL 117  109  104   BUN 6 - 23 mg/dL '19  16  17   '$ Creatinine 0.40 - 1.20 mg/dL 1.10  1.08  1.01   Sodium 135 - 145 mEq/L 138  142  137   Potassium 3.5 - 5.1 mEq/L 4.2  4.3  4.5   Chloride 96 - 112 mEq/L 105  107  104   CO2 19 - 32 mEq/L '25  25  25   '$ Calcium 8.4 - 10.5 mg/dL 10.0  10.5  10.2    Current antihypertensive regimen:  Olmesartan 20 mg    Patient verbally confirms she is taking the above medications as directed. {yes/no:20286}  How often are you checking your Blood Pressure? {CHL HP BP Monitoring  [phone]}  she checks her blood pressure {timing:25218} {before/after:25217} taking her medication.  Current home BP readings: ***  DATE:             BP               PULSE  Wrist or arm cuff:  OTC medications including pseudoephedrine or NSAIDs?  Any readings above 180/120? {yes/no:20286} If yes any symptoms of hypertensive emergency? {hypertensive emergency symptoms:25354}   What recent interventions/DTPs have been made by any provider to improve Blood Pressure control since last CPP Visit: ***  Any recent hospitalizations or ED visits since last visit with CPP? {yes/no:20286}  What diet changes have been made to improve Blood Pressure Control?  Patient follows a mediterranean diet Breakfast - patient will have either cereal or toast with peanut butter and jelly with juice and coffee Lunch - patient will have left overs and fruit Dinner - patient will have patients daughter always cooks and she will have a meal containing a meat with vegetables. Caffeine intake: Salt intake:  What exercise is being done to improve your Blood Pressure Control?  Patient stays active throughout the day, she will occasionally walk and does house work daily.   Adherence Review: Is the patient currently on ACE/ARB medication? Yes Does the patient have >5 day gap between last estimated fill dates? No  Care Gaps: AWV - scheduled 02/15/2022 Last BP - 124/62 on 02/16/2022 Last A1C - 6.6 on 12/28/2021 Eye exam - overdue Covid - overdue  Star Rating Drugs: Livalo 2 mg - last filled 01/17/2022 90 DS at [facility]  Olmesartan 20 mg - last filled 01/14/2022 90 DS at [place] Pharmacist Assistant [phone]

## 2022-04-12 ENCOUNTER — Emergency Department (HOSPITAL_COMMUNITY)
Admission: EM | Admit: 2022-04-12 | Discharge: 2022-04-13 | Disposition: A | Discharge status: Home / Self Care | Payer: Medicare Other | Attending: Emergency Medicine | Admitting: Emergency Medicine

## 2022-04-12 ENCOUNTER — Emergency Department (HOSPITAL_COMMUNITY): Payer: Medicare Other

## 2022-04-12 DIAGNOSIS — S0281XA Fracture of other specified skull and facial bones, right side, initial encounter for closed fracture: Secondary | ICD-10-CM | POA: Diagnosis not present

## 2022-04-12 DIAGNOSIS — R58 Hemorrhage, not elsewhere classified: Secondary | ICD-10-CM | POA: Diagnosis not present

## 2022-04-12 DIAGNOSIS — Z743 Need for continuous supervision: Secondary | ICD-10-CM | POA: Diagnosis not present

## 2022-04-12 DIAGNOSIS — S199XXA Unspecified injury of neck, initial encounter: Secondary | ICD-10-CM | POA: Diagnosis not present

## 2022-04-12 DIAGNOSIS — W19XXXA Unspecified fall, initial encounter: Secondary | ICD-10-CM

## 2022-04-12 DIAGNOSIS — S01112A Laceration without foreign body of left eyelid and periocular area, initial encounter: Secondary | ICD-10-CM

## 2022-04-12 DIAGNOSIS — E119 Type 2 diabetes mellitus without complications: Secondary | ICD-10-CM | POA: Diagnosis not present

## 2022-04-12 DIAGNOSIS — Z7901 Long term (current) use of anticoagulants: Secondary | ICD-10-CM | POA: Diagnosis not present

## 2022-04-12 DIAGNOSIS — S0240FA Zygomatic fracture, left side, initial encounter for closed fracture: Secondary | ICD-10-CM | POA: Insufficient documentation

## 2022-04-12 DIAGNOSIS — W010XXA Fall on same level from slipping, tripping and stumbling without subsequent striking against object, initial encounter: Secondary | ICD-10-CM | POA: Insufficient documentation

## 2022-04-12 DIAGNOSIS — I10 Essential (primary) hypertension: Secondary | ICD-10-CM | POA: Diagnosis not present

## 2022-04-12 DIAGNOSIS — S0083XA Contusion of other part of head, initial encounter: Secondary | ICD-10-CM | POA: Insufficient documentation

## 2022-04-12 DIAGNOSIS — R6889 Other general symptoms and signs: Secondary | ICD-10-CM | POA: Diagnosis not present

## 2022-04-12 DIAGNOSIS — R609 Edema, unspecified: Secondary | ICD-10-CM | POA: Diagnosis not present

## 2022-04-12 DIAGNOSIS — R52 Pain, unspecified: Secondary | ICD-10-CM | POA: Diagnosis not present

## 2022-04-12 DIAGNOSIS — I4891 Unspecified atrial fibrillation: Secondary | ICD-10-CM | POA: Insufficient documentation

## 2022-04-12 DIAGNOSIS — S00202A Unspecified superficial injury of left eyelid and periocular area, initial encounter: Secondary | ICD-10-CM | POA: Diagnosis present

## 2022-04-12 LAB — COMPREHENSIVE METABOLIC PANEL
ALT: 20 U/L (ref 0–44)
AST: 27 U/L (ref 15–41)
Albumin: 4 g/dL (ref 3.5–5.0)
Alkaline Phosphatase: 40 U/L (ref 38–126)
Anion gap: 9 (ref 5–15)
BUN: 18 mg/dL (ref 8–23)
CO2: 23 mmol/L (ref 22–32)
Calcium: 9.9 mg/dL (ref 8.9–10.3)
Chloride: 104 mmol/L (ref 98–111)
Creatinine, Ser: 1.15 mg/dL — ABNORMAL HIGH (ref 0.44–1.00)
GFR, Estimated: 48 mL/min — ABNORMAL LOW (ref >60)
Glucose, Bld: 179 mg/dL — ABNORMAL HIGH (ref 70–99)
Potassium: 4.1 mmol/L (ref 3.5–5.1)
Sodium: 136 mmol/L (ref 135–145)
Total Bilirubin: 0.4 mg/dL (ref 0.3–1.2)
Total Protein: 6.9 g/dL (ref 6.5–8.1)

## 2022-04-12 LAB — CBC WITH DIFFERENTIAL/PLATELET
Abs Immature Granulocytes: 0.01 10*3/uL (ref 0.00–0.07)
Basophils Absolute: 0 10*3/uL (ref 0.0–0.1)
Basophils Relative: 0 %
Eosinophils Absolute: 0.2 10*3/uL (ref 0.0–0.5)
Eosinophils Relative: 3 %
HCT: 44.3 % (ref 36.0–46.0)
Hemoglobin: 15 g/dL (ref 12.0–15.0)
Immature Granulocytes: 0 %
Lymphocytes Relative: 30 %
Lymphs Abs: 2.2 10*3/uL (ref 0.7–4.0)
MCH: 31.5 pg (ref 26.0–34.0)
MCHC: 33.9 g/dL (ref 30.0–36.0)
MCV: 93.1 fL (ref 80.0–100.0)
Monocytes Absolute: 0.8 10*3/uL (ref 0.1–1.0)
Monocytes Relative: 11 %
Neutro Abs: 4 10*3/uL (ref 1.7–7.7)
Neutrophils Relative %: 56 %
Platelets: 251 10*3/uL (ref 150–400)
RBC: 4.76 MIL/uL (ref 3.87–5.11)
RDW: 12.3 % (ref 11.5–15.5)
WBC: 7.3 10*3/uL (ref 4.0–10.5)
nRBC: 0 % (ref 0.0–0.2)

## 2022-04-12 MED ORDER — ACETAMINOPHEN 500 MG PO TABS
1000.0000 mg | ORAL_TABLET | ORAL | Status: AC
  Administered 2022-04-13: 1000 mg via ORAL
  Filled 2022-04-12: qty 2

## 2022-04-12 MED ORDER — LIDOCAINE-EPINEPHRINE-TETRACAINE (LET) TOPICAL GEL
3.0000 mL | Freq: Once | TOPICAL | Status: AC
  Administered 2022-04-13: 3 mL via TOPICAL
  Filled 2022-04-12: qty 3

## 2022-04-12 MED ORDER — LIDOCAINE HCL (PF) 1 % IJ SOLN
10.0000 mL | Freq: Once | INTRAMUSCULAR | Status: AC
  Administered 2022-04-13: 10 mL
  Filled 2022-04-12: qty 10

## 2022-04-12 NOTE — ED Notes (Signed)
Trauma Response Nurse Documentation   Alexa Brown is a 82 y.o. female arriving to [facility] via [facility]  On Eliquis (apixaban) daily. Trauma was activated as a Level 2 by Blaine Hamper based on the following trauma criteria Elderly patients > 65 with head trauma on anti-coagulation (excluding ASA). Trauma team at the bedside on patient arrival.   Patient cleared for CT by Dr. Christy Gentles. Pt transported to CT with trauma response nurse present to monitor. RN remained with the patient throughout their absence from the department for clinical observation.   GCS 15.  History   Past Medical History:  Diagnosis Date   Chicken pox    Depression    Diabetes mellitus without complication (HCC)    GERD (gastroesophageal reflux disease)    Hyperlipidemia    Hypertension    Stroke [facility])    UTI (urinary tract infection)      Past Surgical History:  Procedure Laterality Date   ABDOMINAL HYSTERECTOMY     APPENDECTOMY     BREAST BIOPSY Left 2014   BREAST SURGERY     biopsy       Initial Focused Assessment (If applicable, or please see trauma documentation): Airway-- intact, no visible obstruction Breathing-- spontaneous, unlabored Circulation-- bleeding noted to upper left eye, bleeding controlled upon arrival  CT's Completed:   CT Head, CT Maxillofacial, and CT C-Spine   Interventions:  See event summary  Plan for disposition:  Discharge home   Consults completed:  none at 2318.  Event Summary: Patient brought in by [facility] from home. Patient experienced a mechanical fall this evening, daughter reported patient turned too quickly and fell hitting her head. Patient with laceration and swelling to left upper eye. Patient in c-collar via EMS. Bleeding controlled upon arrival to department. Patient transferred to hospital stretcher and undressed. Manual BP obtained, 132/72. 20 G PIV RFA established. Patient log rolled while maintaining  c-spine by staff. Patient taken to CT by TRN, TRN remained with patient throughout transport and for duration of scans. CT head, cervical spine, maxillofacial completed. MTP Summary (If applicable):   Bedside handoff with ED RN Ander Purpura.    Trudee Kuster  Trauma Response RN  Please call TRN at [phone] for further assistance.

## 2022-04-12 NOTE — ED Triage Notes (Signed)
Patient arrives via EMS for witnessed fall while on blood thinners. Per EMS, patient's daughter states she had a fall at home. Daughter was present and she states that the patient did not have LOC. Patient is on Eliquis for previous PE. Patient appears to be confused on situation during triage stating "What happened, did I fall?" Patient has noticeable laceration to L eyebrow and swelling noted to L face.

## 2022-04-13 ENCOUNTER — Other Ambulatory Visit: Payer: Self-pay | Admitting: Family Medicine

## 2022-04-13 ENCOUNTER — Encounter (HOSPITAL_COMMUNITY): Payer: Self-pay

## 2022-04-13 ENCOUNTER — Other Ambulatory Visit: Payer: Self-pay

## 2022-04-13 ENCOUNTER — Telehealth: Payer: Self-pay | Admitting: Family Medicine

## 2022-04-13 DIAGNOSIS — I674 Hypertensive encephalopathy: Secondary | ICD-10-CM

## 2022-04-13 DIAGNOSIS — S01112A Laceration without foreign body of left eyelid and periocular area, initial encounter: Secondary | ICD-10-CM | POA: Diagnosis not present

## 2022-04-13 MED ORDER — CEPHALEXIN 500 MG PO CAPS: 500.0000 mg | ORAL_CAPSULE | Freq: Two times a day (BID) | ORAL | 0 refills | Status: AC

## 2022-04-13 MED ORDER — BACITRACIN ZINC 500 UNIT/GM EX OINT
TOPICAL_OINTMENT | Freq: Two times a day (BID) | CUTANEOUS | Status: DC
  Filled 2022-04-13: qty 0.9

## 2022-04-13 NOTE — Discharge Instructions (Signed)
Sutured repair Keep the laceration site dry for the next 24 hours and leave the dressing in place. After 24 hours you may remove the dressing and gently clean the laceration site with antibacterial soap and warm water. Do not scrub the area. Do not soak the area and water for long periods of time. Don't use hydrogen peroxide, iodine-based solutions, or alcohol, which can slow healing, and will probably be painful! Apply topical bacitracin 1-2 times per day for the next 3-5 days. Return to the emergency department in 5-7 days for removal of the sutures.  You should return sooner for any signs of infection which would include increased redness around the wound, increased swelling, new drainage of yellow pus.    Because you have a fracture under your eyebrow I would like you to follow up with ENT (ear nose and throat) I have given you their phone number. I have also given you an antibiotic to take as prophylaxis. Take for full course.

## 2022-04-13 NOTE — Telephone Encounter (Signed)
F/u on referrals for physical therapy, states she spoke with assistant back in December. Patient fell again last night

## 2022-04-13 NOTE — ED Provider Notes (Signed)
[facility] EMERGENCY DEPARTMENT Provider Note   CSN: [id number] Arrival date & time: 04/12/22  2245     History  Chief Complaint  Patient presents with   [name]    Alexa Brown is a 82 y.o. female.   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  She states compliance of his medication.  She has a history of depression, hypertension, stroke, DM2  She presented emergency room today after she had a witnessed fall.  Per patient's daughter she fell at home after tripping on something for falls the ground struck her forehead on the ground she did not lose consciousness denies currently nausea or vomiting.  No chest pain or difficulty breathing no areas of pain other than her face head.  She has no experience any lightheadedness or dizziness associated with this episode.  She is due for tetanus in 2 years.   Home Medications Prior to Admission medications   Medication Sig Start Date End Date Taking? Authorizing Provider  cephALEXin (KEFLEX) 500 MG capsule Take 1 capsule (500 mg total) by mouth 2 (two) times daily for 5 days. 04/13/22 04/18/22 Yes Koby Pickup, Ova Freshwater S, PA  Accu-Chek Softclix Lancets lancets Use to check blood sugars 1-2 times daily. Dx:E11.65 10/18/21   Martinique, Amaurie G, MD  acetaminophen (TYLENOL) 325 MG tablet Take 650 mg by mouth every 6 (six) hours as needed for mild pain, fever or headache.    [provider]  allopurinol (ZYLOPRIM) 100 MG tablet TAKE 1 TABLET BY MOUTH TWICE A DAY 04/13/22   Martinique, Colena G, MD  Alpha-Lipoic Acid 600 MG TABS Take 600 mg by mouth daily.    [provider]  apixaban (ELIQUIS) 5 MG TABS tablet Take 1 tablet (5 mg total) by mouth 2 (two) times daily. 06/01/21   Martinique, Korayma G, MD  b complex vitamins capsule Take 1 capsule by mouth daily.    [provider]  Blood Glucose Monitoring Suppl (ACCU-CHEK GUIDE) w/Device KIT Use to check blood  sugars 1-2 times daily. Dx:E11.65 10/18/21   Martinique, Abriana G, MD  buPROPion [facility] SR) 100 MG 12 hr tablet TAKE 1 TABLET BY MOUTH TWICE A DAY 11/23/21   Martinique, Makia G, MD  calcium carbonate (CALCIUM 600) 600 MG TABS tablet Take 600 mg by mouth daily.    [provider]  DULoxetine (CYMBALTA) 60 MG capsule TAKE 1 CAPSULE BY MOUTH EVERY DAY 11/23/21   Martinique, Sharlee G, MD  ezetimibe (ZETIA) 10 MG tablet Take 1 tablet (10 mg total) by mouth daily. 01/02/22   Martinique, Jailene G, MD  glucose blood (ACCU-CHEK GUIDE) test strip Use to test blood sugars 1-2 times daily. Dx:e11.65 10/18/21   Martinique, Dorenda G, MD  Homeopathic Products ([address]) Apply 1 application topically daily as needed (foot cramps).    [provider]  ketoconazole (NIZORAL) 2 % cream SMARTSIG:1 Application Topical 1 to 2 Times Daily 11/06/20   [provider]  L-Lysine 500 MG TABS Take 1 tablet by mouth daily.    [provider]  Lancet Devices [facility]) lancets Use to test blood sugar once a day. 03/31/16   Martinique, Avari G, MD  metroNIDAZOLE (METROCREAM) 0.75 % cream Apply topically 2 (two) times daily. Apply a thin layer twice a day to a clean dry face. 03/11/21   Billie Ruddy, MD  Multiple Vitamin (MULTIVITAMIN) capsule Take 1 capsule by mouth daily.  [provider]  olmesartan (BENICAR) 20 MG tablet TAKE 1 TABLET BY MOUTH EVERY DAY 04/13/22   Martinique, Quetzalli G, MD  pantoprazole (PROTONIX) 20 MG tablet Take 1 tablet (20 mg total) by mouth daily. 06/01/21   Martinique, Izzie G, MD  Pitavastatin Calcium (LIVALO) 2 MG TABS Take 1 tablet (2 mg total) by mouth daily. 01/02/22   Martinique, Lydiann G, MD      Allergies    Patient has no known allergies.    Review of Systems   Review of Systems  Physical Exam Updated Vital Signs BP (!) 126/58   Pulse 85   Temp 98.5 F (36.9 C)   Resp 15   Ht '5\' 9"'$  (1.753 m)   Wt 73.5 kg   SpO2 97%   BMI 23.92 kg/m  Physical  Exam Vitals and nursing note reviewed.  Constitutional:      General: She is not in acute distress. HENT:     Head: Normocephalic and atraumatic.     Nose: Nose normal.  Eyes:     General: No scleral icterus. Cardiovascular:     Rate and Rhythm: Normal rate and regular rhythm.     Pulses: Normal pulses.     Heart sounds: Normal heart sounds.  Pulmonary:     Effort: Pulmonary effort is normal. No respiratory distress.     Breath sounds: No wheezing.  Abdominal:     Palpations: Abdomen is soft.     Tenderness: There is no abdominal tenderness.  Musculoskeletal:     Cervical back: Normal range of motion.     Right lower leg: No edema.     Left lower leg: No edema.  Skin:    General: Skin is warm and dry.     Capillary Refill: Capillary refill takes less than 2 seconds.     Comments: 1.5 cm laceration to left eyebrow  Neurological:     Mental Status: She is alert. Mental status is at baseline.  Psychiatric:        Mood and Affect: Mood normal.        Behavior: Behavior normal.     ED Results / Procedures / Treatments   Labs (all labs ordered are listed, but only abnormal results are displayed) Labs Reviewed  COMPREHENSIVE METABOLIC PANEL - Abnormal; Notable for the following components:      Result Value   Glucose, Bld 179 (*)    Creatinine, Ser 1.15 (*)    GFR, Estimated 48 (*)    All other components within normal limits  CBC WITH DIFFERENTIAL/PLATELET    EKG EKG Interpretation  Date/Time:  Wednesday April 13 2022 00:01:23 EST Ventricular Rate:  81 PR Interval:  174 QRS Duration: 92 QT Interval:  382 QTC Calculation: 444 R Axis:   79 Text Interpretation: Sinus rhythm Confirmed by Ripley Fraise [phone]) on 04/13/2022 12:05:47 AM  Radiology CT Cervical Spine Wo Contrast  Result Date: 04/13/2022 CLINICAL DATA:  Polytrauma, blunt EXAM: CT CERVICAL SPINE WITHOUT CONTRAST TECHNIQUE: Multidetector CT imaging of the cervical spine was performed without  intravenous contrast. Multiplanar CT image reconstructions were also generated. RADIATION DOSE REDUCTION: This exam was performed according to the departmental dose-optimization program which includes automated exposure control, adjustment of the mA and/or kV according to patient size and/or use of iterative reconstruction technique. COMPARISON:  CT C-spine 08/26/2021 FINDINGS: Alignment: Normal. Skull base and vertebrae: Multilevel at least moderate degenerative changes of the spine. No severe osseous central canal or neural foraminal stenosis. No acute  fracture. No aggressive appearing focal osseous lesion or focal pathologic process. Soft tissues and spinal canal: No prevertebral fluid or swelling. No visible canal hematoma. Upper chest: Unremarkable. Other: None. IMPRESSION: No acute displaced fracture or traumatic listhesis of the cervical spine. Electronically Signed   By: Iven Finn M.D.   On: 04/13/2022 02:33   CT Head Wo Contrast  Addendum Date: 04/13/2022   ADDENDUM REPORT: 04/13/2022 02:16 ADDENDUM: Please see separately dictated CT C-spine 04/12/2022. Electronically Signed   By: Iven Finn M.D.   On: 04/13/2022 02:16   Result Date: 04/13/2022 CLINICAL DATA:  Polytrauma, blunt; Facial trauma, blunt EXAM: CT HEAD WITHOUT CONTRAST CT MAXILLOFACIAL WITHOUT CONTRAST TECHNIQUE: Multidetector CT imaging of the head and maxillofacial structures were performed using the standard protocol without intravenous contrast. Multiplanar CT image reconstructions of the maxillofacial structures were also generated. RADIATION DOSE REDUCTION: This exam was performed according to the departmental dose-optimization program which includes automated exposure control, adjustment of the mA and/or kV according to patient size and/or use of iterative reconstruction technique. COMPARISON:  None Available. FINDINGS: CT HEAD FINDINGS Brain: No evidence of large-territorial acute infarction. No parenchymal hemorrhage. No  mass lesion. No extra-axial collection. No mass effect or midline shift. No hydrocephalus. Basilar cisterns are patent. Vascular: No hyperdense vessel. Skull: No acute fracture or focal lesion. Other: None. CT MAXILLOFACIAL FINDINGS Osseous: Acute minimally displaced left sphenozygomatic suture fracture extending to the frontosphenoidal suture. No destructive process. Sinuses/Orbits: Mucosal thickening of the left maxillary sinus. Otherwise the visualized paranasal sinuses and mastoid air cells are clear. Bilateral lens replacement. Otherwise the orbits are unremarkable. Soft tissues: Left maxillary soft tissue hematoma formation. Left periorbital soft tissue hematoma and emphysema. IMPRESSION: 1. No acute intracranial abnormality. 2. Acute minimally displaced left sphenozygomatic suture fracture extending to the frontosphenoidal suture. 3. Left periorbital and maxillary subcutaneus soft tissue edema and emphysema. Electronically Signed: By: Iven Finn M.D. On: 04/12/2022 23:26   CT Maxillofacial Wo Contrast  Addendum Date: 04/13/2022   ADDENDUM REPORT: 04/13/2022 02:16 ADDENDUM: Please see separately dictated CT C-spine 04/12/2022. Electronically Signed   By: Iven Finn M.D.   On: 04/13/2022 02:16   Result Date: 04/13/2022 CLINICAL DATA:  Polytrauma, blunt; Facial trauma, blunt EXAM: CT HEAD WITHOUT CONTRAST CT MAXILLOFACIAL WITHOUT CONTRAST TECHNIQUE: Multidetector CT imaging of the head and maxillofacial structures were performed using the standard protocol without intravenous contrast. Multiplanar CT image reconstructions of the maxillofacial structures were also generated. RADIATION DOSE REDUCTION: This exam was performed according to the departmental dose-optimization program which includes automated exposure control, adjustment of the mA and/or kV according to patient size and/or use of iterative reconstruction technique. COMPARISON:  None Available. FINDINGS: CT HEAD FINDINGS Brain: No  evidence of large-territorial acute infarction. No parenchymal hemorrhage. No mass lesion. No extra-axial collection. No mass effect or midline shift. No hydrocephalus. Basilar cisterns are patent. Vascular: No hyperdense vessel. Skull: No acute fracture or focal lesion. Other: None. CT MAXILLOFACIAL FINDINGS Osseous: Acute minimally displaced left sphenozygomatic suture fracture extending to the frontosphenoidal suture. No destructive process. Sinuses/Orbits: Mucosal thickening of the left maxillary sinus. Otherwise the visualized paranasal sinuses and mastoid air cells are clear. Bilateral lens replacement. Otherwise the orbits are unremarkable. Soft tissues: Left maxillary soft tissue hematoma formation. Left periorbital soft tissue hematoma and emphysema. IMPRESSION: 1. No acute intracranial abnormality. 2. Acute minimally displaced left sphenozygomatic suture fracture extending to the frontosphenoidal suture. 3. Left periorbital and maxillary subcutaneus soft tissue edema and emphysema. Electronically Signed: By: Thomasena Edis  Mckinley Jewel M.D. On: 04/12/2022 23:26    Procedures .Marland KitchenLaceration Repair  Date/Time: 04/13/2022 3:16 AM  Performed by: Tedd Sias, PA Authorized by: Tedd Sias, PA   Consent:    Consent obtained:  Verbal   Consent given by:  Parent and patient   Risks discussed:  Infection, pain, poor cosmetic result and poor wound healing Universal protocol:    Procedure explained and questions answered to patient or proxy's satisfaction: yes     Relevant documents present and verified: yes     Test results available: yes     Imaging studies available: yes     Required blood products, implants, devices, and special equipment available: yes     Site/side marked: yes     Immediately prior to procedure, a time out was called: yes     Patient identity confirmed:  Verbally with patient and arm band Laceration details:    Length (cm):  1.5   Laceration depth: deep. Exploration:     Hemostasis achieved with:  LET   Imaging obtained comment:  CT   Imaging outcome: foreign body not noted     Contaminated: no   Treatment:    Amount of cleaning:  Extensive   Irrigation solution:  Sterile water   Irrigation volume:  Copious   Visualized foreign bodies/material removed: no     Layers/structures repaired:  Deep dermal/superficial fascia Deep dermal/superficial fascia:    Suture size:  5-0   Deep dermal/superficial fascia suture material: Fast absorbing gut.   Number of sutures:  2 Skin repair:    Repair method:  Sutures   Suture size:  5-0   Suture material:  Prolene   Suture technique:  Simple interrupted   Number of sutures:  2 Approximation:    Approximation:  Close Post-procedure details:    Dressing:  Antibiotic ointment   Procedure completion:  Tolerated Comments:     Patient tolerated procedure well.  Bleeding stopped after suturing was completed.  Hemostasis confirmed on recheck     Medications Ordered in ED Medications  acetaminophen (TYLENOL) tablet 1,000 mg (1,000 mg Oral Given 04/13/22 0004)  lidocaine-EPINEPHrine-tetracaine (LET) topical gel (3 mLs Topical Given 04/13/22 0004)  lidocaine (PF) (XYLOCAINE) 1 % injection 10 mL (10 mLs Infiltration Given by Other 04/13/22 0149)    ED Course/ Medical Decision Making/ A&P Clinical Course as of 04/13/22 2315  Wed Apr 13, 2022  0019 LET at 12:05 -- ready at 12:35 [WF]  0231 2 5-0 prolene 2 5-0 gut [WF]    Clinical Course User Index [WF] Tedd Sias, PA                             Medical Decision Making Risk OTC drugs. Prescription drug management.   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  She states compliance of his medication.  She has a history of depression, hypertension, stroke, DM2  She presented emergency room today after she had a witnessed fall.  Per patient's daughter she fell at home after tripping on something for falls the  ground struck her forehead on the ground she did not lose consciousness denies currently nausea or vomiting.  No chest pain or difficulty breathing no areas of pain other than her face head.  She has no experience any lightheadedness or dizziness associated with this episode.  She is due for tetanus in 2 years.  Will  defer this.  Patient's physical exam is reassuring.  She does have swelling over her left eyebrow and bruising.  She does have a zygomatic arch fracture.  It is nondisplaced.  She will need to follow-up with her ENT.  I discussed this case with my today physician to sign this note who agrees my plan to discharge home with ENT follow-up, Keflex, Tylenol and return precautions.  CMP CBC unremarkable. CT head and CT C-spine unremarkable.  CT max face shows a zygomatic arch fracture.  On physical exam there is no evidence of entrapment EOMI.  No double vision I personally reviewed all CT scans and lab work.  Patient feels improved after Tylenol and after repair of her eyebrow.  She is well-appearing, ambulating with her daughter close at hand for support.  Will discharge home at this time.  Return precautions discussed   Final Clinical Impression(s) / ED Diagnoses Final diagnoses:  Laceration of left eyebrow, initial encounter  Fall, initial encounter  Contusion of face, initial encounter  Closed fracture of left zygomatic arch, initial encounter [facility])    Rx / DC Orders ED Discharge Orders          Ordered    cephALEXin (KEFLEX) 500 MG capsule  2 times daily        04/13/22 0243              Tedd Sias, PA 04/13/22 2317    Quintella Reichert, MD 04/15/22 1717

## 2022-04-28 DIAGNOSIS — L814 Other melanin hyperpigmentation: Secondary | ICD-10-CM | POA: Diagnosis not present

## 2022-04-28 DIAGNOSIS — L821 Other seborrheic keratosis: Secondary | ICD-10-CM | POA: Diagnosis not present

## 2022-04-28 DIAGNOSIS — Z86007 Personal history of in-situ neoplasm of skin: Secondary | ICD-10-CM | POA: Diagnosis not present

## 2022-04-28 DIAGNOSIS — D1801 Hemangioma of skin and subcutaneous tissue: Secondary | ICD-10-CM | POA: Diagnosis not present

## 2022-04-28 DIAGNOSIS — Z08 Encounter for follow-up examination after completed treatment for malignant neoplasm: Secondary | ICD-10-CM | POA: Diagnosis not present

## 2022-04-28 DIAGNOSIS — C44729 Squamous cell carcinoma of skin of left lower limb, including hip: Secondary | ICD-10-CM | POA: Diagnosis not present

## 2022-04-28 DIAGNOSIS — D485 Neoplasm of uncertain behavior of skin: Secondary | ICD-10-CM | POA: Diagnosis not present

## 2022-04-28 NOTE — Telephone Encounter (Signed)
Pt's daughter called to FU on PT request.  Pt states she has been waiting for a phone call or info since early December.  I see a referral for [facility] aka: [facility], but it says: Status = Closed. Pt said she would call them directly to follow up. Do they need a new referral or is the 03/14/22 referral still good?  Please advise.

## 2022-05-02 DIAGNOSIS — D485 Neoplasm of uncertain behavior of skin: Secondary | ICD-10-CM | POA: Diagnosis not present

## 2022-05-12 DIAGNOSIS — R350 Frequency of micturition: Secondary | ICD-10-CM | POA: Diagnosis not present

## 2022-05-22 ENCOUNTER — Other Ambulatory Visit: Payer: Self-pay | Admitting: Family Medicine

## 2022-05-22 DIAGNOSIS — F331 Major depressive disorder, recurrent, moderate: Secondary | ICD-10-CM

## 2022-05-22 DIAGNOSIS — G629 Polyneuropathy, unspecified: Secondary | ICD-10-CM

## 2022-06-10 DIAGNOSIS — D0472 Carcinoma in situ of skin of left lower limb, including hip: Secondary | ICD-10-CM | POA: Diagnosis not present

## 2022-06-14 IMAGING — CT CT ANGIO CHEST
3 of 7 series · 16 of 36 positions shown · IV contrast (OMNIPAQUE 350)
Comparison: Chest radiographs 1518 hours today.
COMPARISON: Chest radiographs 1518 hours today.

Addendum:
CLINICAL DATA: 80-year-old female with progressive, persistent
shortness of breath.

EXAM:
CT ANGIOGRAPHY CHEST WITH CONTRAST
TECHNIQUE: Multidetector CT imaging of the chest was performed using the
standard protocol during bolus administration of intravenous
contrast. Multiplanar CT image reconstructions and MIPs were
obtained to evaluate the vascular anatomy.
CONTRAST:  69mL OMNIPAQUE IOHEXOL 350 MG/ML SOLN

[Series 5: thins · axial · 0.66mm/px · z∈[-251,-42]mm · 12 of 249 slices shown]
[im 20/249  lung]
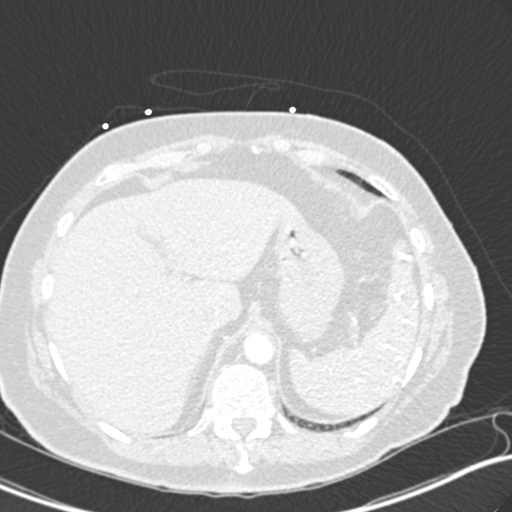
[im 39/249  mediastinal]
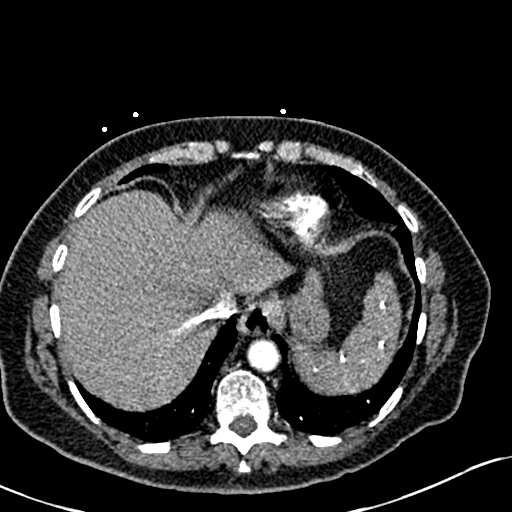
[im 58/249  lung]
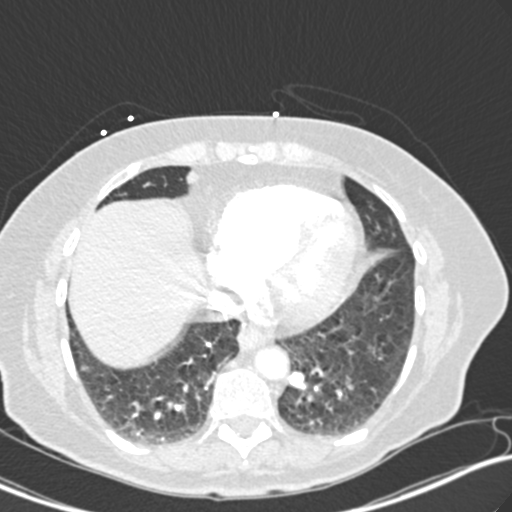
[im 77/249  mediastinal]
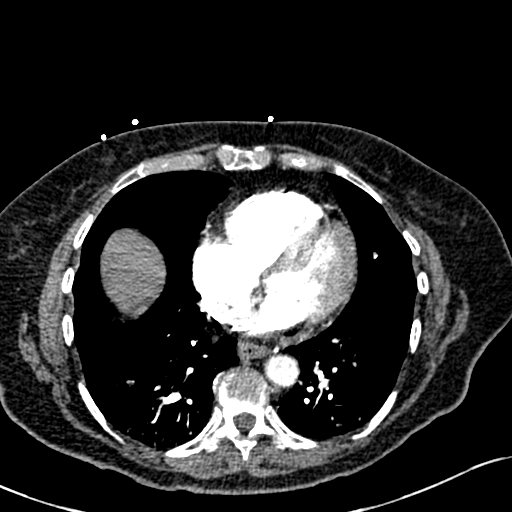
[im 96/249  lung]
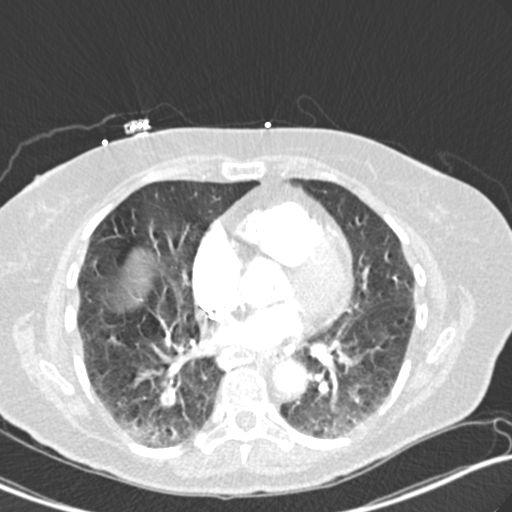
[im 115/249  mediastinal]
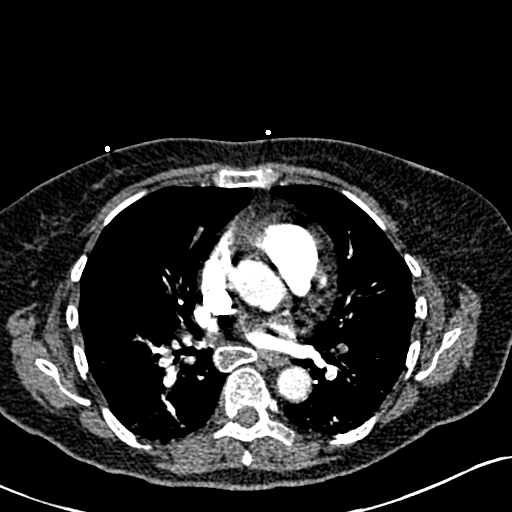
[im 134/249  lung]
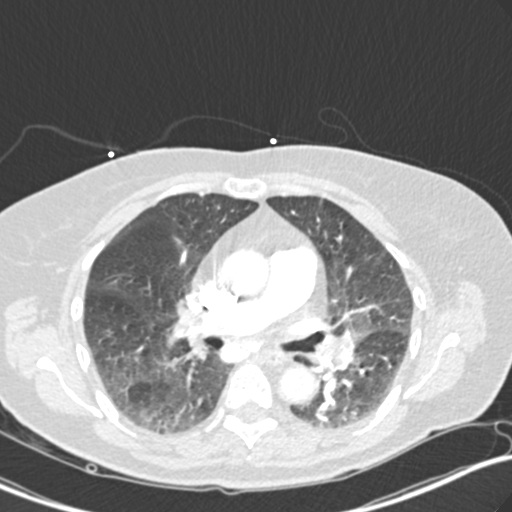
[im 153/249  mediastinal]
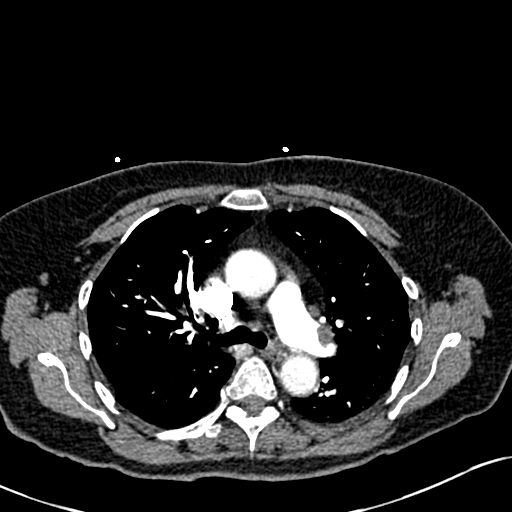
[im 172/249  lung]
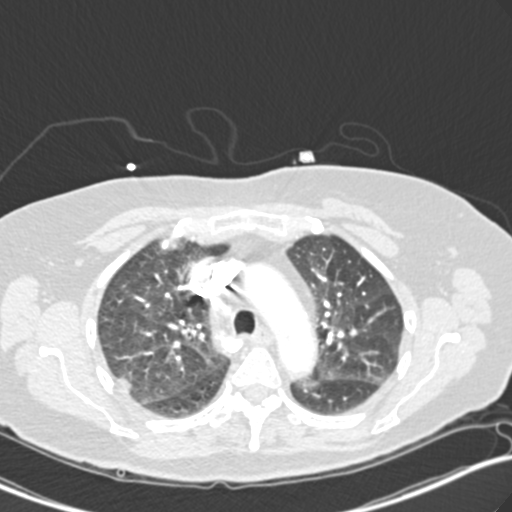
[im 191/249  mediastinal]
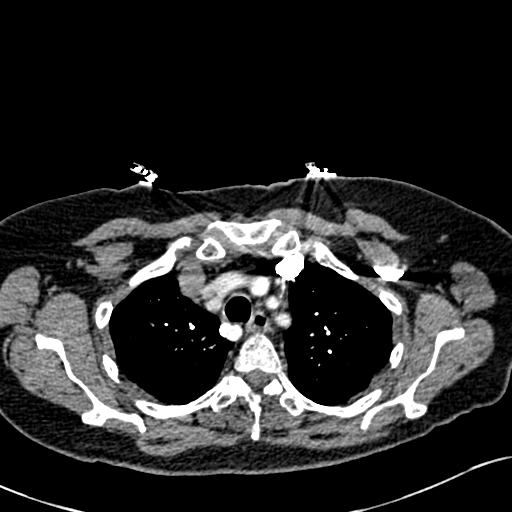
[im 210/249  lung]
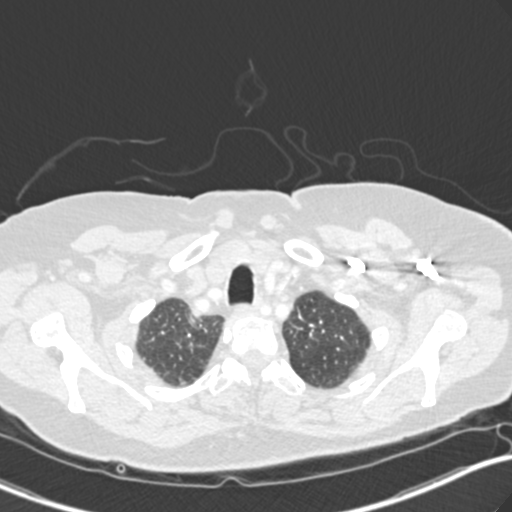
[im 229/249  mediastinal]
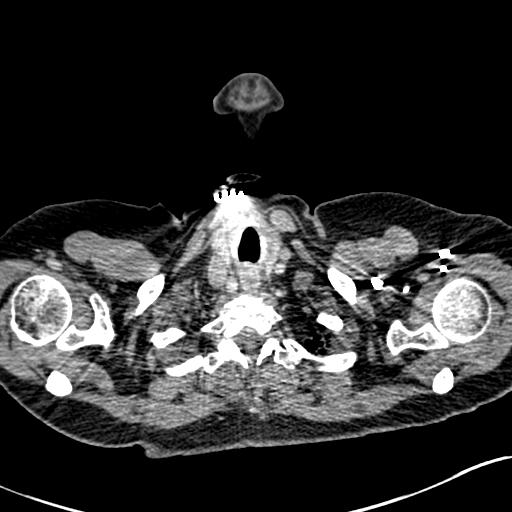

[Series 6: coronal mpr · coronal · 0.48mm/px · 1 of 148 slices shown]
[im 74/148  mediastinal]
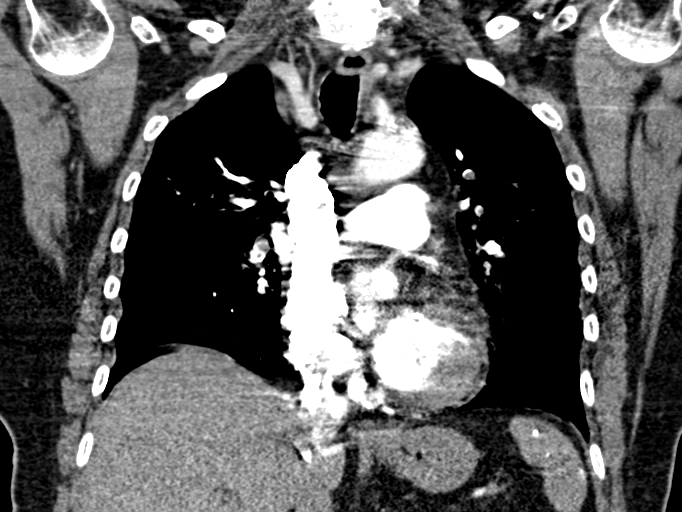

[Series 10: lung · axial · 0.66mm/px · z∈[-220,-140]mm · 3 of 119 slices shown]
[im 20/119  mediastinal]
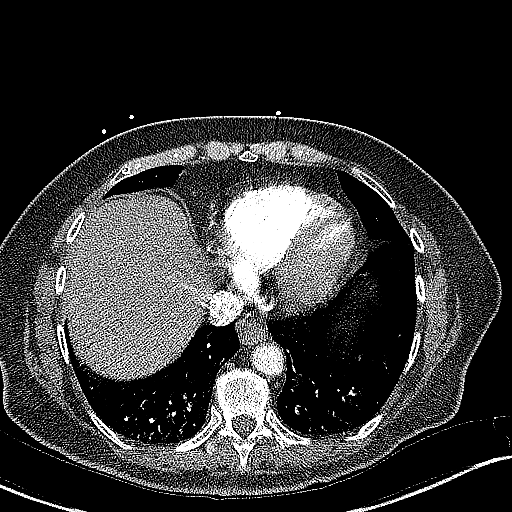
[im 40/119  mediastinal]
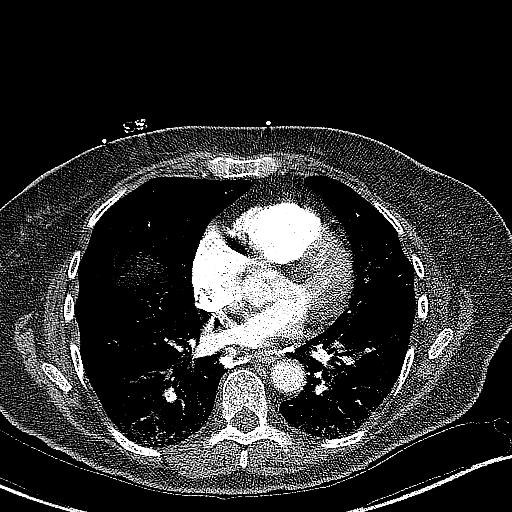
[im 60/119  mediastinal]
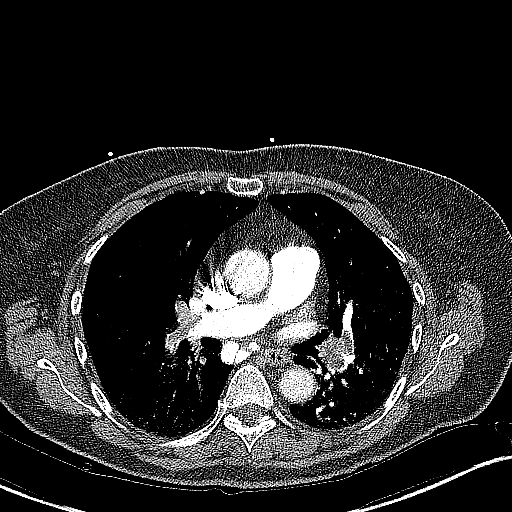

[16 of 36 positions shown; findings below may reference images not displayed]

FINDINGS: Cardiovascular: Excellent contrast bolus timing in the pulmonary
arterial tree. Positive bilateral pulmonary emboli with saddle
embolus on series 5, image 110. Lobar and segmental emboli
bilaterally. Superimposed respiratory motion artifact.

Abnormal RV/LV ratio > 1.  No pericardial effusion.

Calcified aortic atherosclerosis.

Mediastinum/Nodes: Numerous calcified mediastinal lymph nodes
compatible with prior granulomatous process. No suspicious
mediastinal lymphadenopathy. No axillary lymphadenopathy.

Lungs/Pleura: Major airways are patent. Low lung volumes with
atelectasis. Calcified left lower lobe medial basal segment
granuloma. Small posterior right upper lobe pulmonary infarct
suspected abutting the major fissure on series 10, image 43. No
other confluent pulmonary opacity. No pleural effusion.

Upper Abdomen: Negative visible liver, gallbladder, adrenal glands
and bowel. Numerous calcified splenic granulomas.

Musculoskeletal: Chronic appearing T11 superior endplate
compression. No acute osseous abnormality identified. No suspicious
osseous lesion identified.

Review of the MIP images confirms the above findings.
IMPRESSION: 1. Positive for acute PE with saddle embolus, bulky clot
bilaterally, and CT evidence of right heart strain (RV/LV Ratio >1)
consistent with at least submassive (intermediate risk) PE. The
presence of right heart strain has been associated with an increased
risk of morbidity and mortality. Please refer to the "PE Focused"
order set in [REDACTED].

2. Small peripheral right upper lobe pulmonary infarct which
corresponds to the earlier chest x-ray density. No pleural effusion.

3. Incidental sequelae of prior granulomatous disease in the chest
and abdomen.

4. Aortic Atherosclerosis (H1WEH-25I.I).

ADDENDUM:
Critical Value/emergent results were called by telephone at the time
of interpretation on 07/24/2020 at [DATE] to Dr. COLUMBA LUCIO ,
who verbally acknowledged these results.

*** End of Addendum ***
FINDINGS: Cardiovascular: Excellent contrast bolus timing in the pulmonary
arterial tree. Positive bilateral pulmonary emboli with saddle
embolus on series 5, image 110. Lobar and segmental emboli
bilaterally. Superimposed respiratory motion artifact.

Abnormal RV/LV ratio > 1.  No pericardial effusion.

Calcified aortic atherosclerosis.

Mediastinum/Nodes: Numerous calcified mediastinal lymph nodes
compatible with prior granulomatous process. No suspicious
mediastinal lymphadenopathy. No axillary lymphadenopathy.

Lungs/Pleura: Major airways are patent. Low lung volumes with
atelectasis. Calcified left lower lobe medial basal segment
granuloma. Small posterior right upper lobe pulmonary infarct
suspected abutting the major fissure on series 10, image 43. No
other confluent pulmonary opacity. No pleural effusion.

Upper Abdomen: Negative visible liver, gallbladder, adrenal glands
and bowel. Numerous calcified splenic granulomas.

Musculoskeletal: Chronic appearing T11 superior endplate
compression. No acute osseous abnormality identified. No suspicious
osseous lesion identified.

Review of the MIP images confirms the above findings.
IMPRESSION: 1. Positive for acute PE with saddle embolus, bulky clot
bilaterally, and CT evidence of right heart strain (RV/LV Ratio >1)
consistent with at least submassive (intermediate risk) PE. The
presence of right heart strain has been associated with an increased
risk of morbidity and mortality. Please refer to the "PE Focused"
order set in [REDACTED].

2. Small peripheral right upper lobe pulmonary infarct which
corresponds to the earlier chest x-ray density. No pleural effusion.

3. Incidental sequelae of prior granulomatous disease in the chest
and abdomen.

4. Aortic Atherosclerosis (H1WEH-25I.I).

## 2022-06-15 ENCOUNTER — Ambulatory Visit (INDEPENDENT_AMBULATORY_CARE_PROVIDER_SITE_OTHER): Payer: Medicare Other | Admitting: Podiatry

## 2022-06-15 DIAGNOSIS — E1142 Type 2 diabetes mellitus with diabetic polyneuropathy: Secondary | ICD-10-CM

## 2022-06-15 DIAGNOSIS — M79675 Pain in left toe(s): Secondary | ICD-10-CM | POA: Diagnosis not present

## 2022-06-15 DIAGNOSIS — M79674 Pain in right toe(s): Secondary | ICD-10-CM | POA: Diagnosis not present

## 2022-06-15 DIAGNOSIS — B351 Tinea unguium: Secondary | ICD-10-CM

## 2022-06-15 DIAGNOSIS — L84 Corns and callosities: Secondary | ICD-10-CM | POA: Diagnosis not present

## 2022-06-15 NOTE — Progress Notes (Unsigned)
  Subjective:  Patient ID: Alexa Brown, female    DOB: 1940-10-09,  MRN: [id number]  Makalie Bogue Brown presents to clinic today for {jgcomplaint:23593}  Chief Complaint  Patient presents with   Nail Problem    Prediabetic  BS-do not check often A1C-do not know PCP-[facility] PCP VST-4  Months ago   New problem(s): None. {jgcomplaint:23593}  PCP is Martinique, Torah G, MD.  No Known Allergies  Review of Systems: Negative except as noted in the HPI.  Objective: No changes noted in today's physical examination. There were no vitals filed for this visit. Alexa Brown is a pleasant 82 y.o. female {jgbodyhabitus:24098} AAO x 3.  Vascular CFT <3 seconds b/l LE. Palpable DP/PT pulses b/l LE. Digital hair absent b/l. Skin temperature gradient WNL b/l. No pain with calf compression b/l. No edema noted b/l. No cyanosis or clubbing noted b/l LE.  Neurologic Normal speech. Oriented to person, place, and time. Pt has subjective symptoms of neuropathy. Protective sensation intact 5/5 intact bilaterally with 10g monofilament b/l. Vibratory sensation intact b/l.  Dermatologic Pedal integument with normal turgor, texture and tone b/l LE. No open wounds b/l. No interdigital macerations b/l. Toenails 1-5 b/l elongated, thickened, discolored with subungual debris. +Tenderness with dorsal palpation of nailplates.   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  Orthopedic: Muscle strength 5/5 to all lower extremity muscle groups bilaterally. HAV with bunion deformity noted b/l LE. Utilizes cane for ambulation assistance.   Radiographs: None  Assessment/Plan: 1. Pain due to onychomycosis of toenails of both feet   2. 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   None {Jgplan:23602::"-Patient/POA to call should there be question/concern in the interim."}    Return in about 9 weeks (around 08/17/2022).  Marzetta Board, DPM

## 2022-06-16 ENCOUNTER — Encounter: Payer: Self-pay | Admitting: Podiatry

## 2022-07-13 ENCOUNTER — Other Ambulatory Visit: Payer: Self-pay | Admitting: Family Medicine

## 2022-07-14 ENCOUNTER — Telehealth: Payer: Self-pay

## 2022-07-14 NOTE — Progress Notes (Signed)
Care Management & Coordination Services Pharmacy Team  Reason for Encounter: Appointment Reminder  Contacted patient to confirm telephone appointment with Delano Metz, PharmD on 07/15/2022 at 11:00. Unsuccessful outreach. Left voicemail for patient to return call.  Do you have any problems getting your medications?   What is your top health concern you would like to discuss at your upcoming visit?   Have you seen any other providers since your last visit with PCP?   Care Gaps: AWV - completed 02/16/2022,  scheduled 02/21/2023 Last eye exam - 09/21/2020 Last foot exam - 08/17/2021 Last BP - 131/68 on 02/28/2022 Last A1C - 6.6 on 12/28/2021 Covid - overdue Urine ACR - overdue   Star Rating Drugs: Livalo 2 mg - last filled 07/13/2022 [facility]  Olmesartan 20 mg - last filled 07/14/2022 [facility] at [facility]  Inetta Fermo [employer] [facility]  Clinical Pharmacist Assistant [phone]

## 2022-07-14 NOTE — Progress Notes (Signed)
Care Management & Coordination Services Pharmacy Note  07/15/2022 Name:  Alexa Brown MRN:  [id number] DOB:  [birth year]  Summary: A1C at goal <7 LDL not at goal <70 Due for updated DEXA Scan  Recommendations/Changes made from today's visit: -Counseled to get an eye exam each year -Counseled on sugar goals and routine sugar monitoring -Counseled to limit/avoid any fried, fatty foods -ORDER updated DEXA scan with PCP approval  Follow up plan: General review in 3 months Pharmacist visit in Dec   Subjective: Alexa Brown is an 82 y.o. year old female who is a primary patient of [place], Timoteo Expose, MD.  The care coordination team was consulted for assistance with disease management and care coordination needs.    Engaged with patient by telephone for follow up visit.  Recent office visits: 02/16/2022 Alexa Mulligan LPN - Medicare annual wellness exam   Recent consult visits: 06/15/22 Alexa Brown, DPM - Pain due to onychomycosis of toenails of both feet. No med changes  02/28/2022 Alexa Brown DPM  - Patient was seen for Pain due to onychomycosis of toenails of both feet and an additional concern. No medication changes.   Hospital visits: None in previous 6 months   Objective:  Lab Results  Component Value Date   CREATININE 1.15 (H) 04/12/2022   BUN 18 04/12/2022   GFR 47.79 (L) 12/28/2021   GFRNONAA 48 (L) 04/12/2022   GFRAA 63 02/11/2020   NA 136 04/12/2022   K 4.1 04/12/2022   CALCIUM 9.9 04/12/2022   CO2 23 04/12/2022   GLUCOSE 179 (H) 04/12/2022    Lab Results  Component Value Date/Time   HGBA1C 6.6 (H) 12/28/2021 09:41 AM   HGBA1C 6.1 03/31/2021 10:05 AM   HGBA1C 5.5 11/02/2020 08:56 AM   HGBA1C 6.2 07/14/2020 09:17 AM   HGBA1C 5.7 07/27/2015 12:00 AM   HGBA1C 5.9 03/27/2015 12:00 AM   FRUCTOSAMINE 253 12/18/2018 09:23 AM   GFR 47.79 (L) 12/28/2021 09:41 AM   GFR 49.02 (L) 06/29/2021 09:23 AM   MICROALBUR <0.7 06/29/2021 09:23  AM   MICROALBUR 1.1 07/14/2020 09:17 AM    Last diabetic Eye exam: No results found for: "HMDIABEYEEXA"  Last diabetic Foot exam: No results found for: "HMDIABFOOTEX"   Lab Results  Component Value Date   CHOL 205 (H) 12/28/2021   HDL 73.40 12/28/2021   LDLCALC 94 12/28/2021   TRIG 190.0 (H) 12/28/2021   CHOLHDL 3 12/28/2021       Latest Ref Rng & Units 04/12/2022   10:57 PM 12/28/2021    9:41 AM 07/27/2020    4:35 AM  Hepatic Function  Total Protein 6.5 - 8.1 Brown/dL 6.9  7.5    Albumin 3.5 - 5.0 Brown/dL 4.0  4.4  3.4   AST 15 - 41 U/L 27  23    ALT 0 - 44 U/L 20  21    Alk Phosphatase 38 - 126 U/L 40  46    Total Bilirubin 0.3 - 1.2 mg/dL 0.4  0.4      Lab Results  Component Value Date/Time   TSH 2.87 12/18/2018 09:23 AM   TSH 1.41 10/30/2017 12:00 AM       Latest Ref Rng & Units 04/12/2022   10:57 PM 07/30/2020   11:58 AM 07/27/2020    4:35 AM  CBC  WBC 4.0 - 10.5 K/uL 7.3  5.3  5.2   Hemoglobin 12.0 - 15.0 Brown/dL 40.9  81.1  91.4   Hematocrit 36.0 - 46.0 %  44.3  42.8  41.1   Platelets 150 - 400 K/uL 251  253.0  223     Lab Results  Component Value Date/Time   VD25OH 67 07/21/2017 04:02 PM   VD25OH 43.34 01/23/2017 01:08 PM   VD25OH 54.05 01/14/2016 03:49 PM   VITAMINB12 951 10/30/2017 12:00 AM    Clinical ASCVD: Yes  The ASCVD Risk score (Arnett DK, et al., 2019) failed to calculate for the following reasons:   The 2019 ASCVD risk score is only valid for ages 65 to 32   The patient has a prior MI or stroke diagnosis    DEXA (11/17/2016) - Normal BMD     02/16/2022    1:25 PM 12/28/2021    9:03 AM 09/03/2021    8:38 AM  Depression screen PHQ 2/9  Decreased Interest 0 2 2  Down, Depressed, Hopeless 0 2 2  PHQ - 2 Score 0 4 4  Altered sleeping 0 2 2  Tired, decreased energy 0 2 2  Change in appetite 0 2 0  Feeling bad or failure about yourself  0 2 1  Trouble concentrating 0 1 0  Moving slowly or fidgety/restless 0 0 0  Suicidal thoughts 0 1 0  PHQ-9 Score 0  14 9  Difficult doing work/chores Not difficult at all Somewhat difficult Somewhat difficult     Social History   Tobacco Use  Smoking Status Never  Smokeless Tobacco Never   BP Readings from Last 3 Encounters:  04/13/22 (!) 126/58  02/28/22 131/68  02/16/22 124/62   Pulse Readings from Last 3 Encounters:  04/13/22 85  02/16/22 86  12/28/21 100   Wt Readings from Last 3 Encounters:  04/13/22 162 lb (73.5 kg)  02/16/22 168 lb 11.2 oz (76.5 kg)  12/28/21 166 lb 2 oz (75.4 kg)   BMI Readings from Last 3 Encounters:  04/13/22 23.92 kg/m  02/16/22 26.42 kg/m  12/28/21 26.02 kg/m    No Known Allergies  Medications Reviewed Today     Reviewed by Sherrill Raring, RPH (Pharmacist) on 07/15/22 at 1125  Med List Status: <None>   Medication Order Taking? Sig Documenting Provider Last Dose Status Informant  Accu-Chek Softclix Lancets lancets [id number] No Use to check blood sugars 1-2 times daily. Dx:E11.65 [place], Alexa G, MD Taking Active   acetaminophen (TYLENOL) 325 MG tablet [id number] No Take 650 mg by mouth every 6 (Brown) hours as needed for mild pain, fever or headache. [provider] Taking Active Child  allopurinol (ZYLOPRIM) 100 MG tablet [id number]  TAKE 1 TABLET BY MOUTH TWICE A DAY [place], Alexa G, MD  Active   Alpha-Lipoic Acid 600 MG TABS [id number] No Take 600 mg by mouth daily. [provider] Taking Active Child  b complex vitamins capsule [id number] No Take 1 capsule by mouth daily. [provider] Taking Active   Blood Glucose Monitoring Suppl (ACCU-CHEK GUIDE) w/Device KIT [id number] No Use to check blood sugars 1-2 times daily. Dx:E11.65 [place], Alexa G, MD Taking Active   buPROPion ER [facility] SR) 100 MG 12 hr tablet [id number]  TAKE 1 TABLET BY MOUTH TWICE A DAY [place], Alexa G, MD  Active   calcium carbonate (CALCIUM 600) 600 MG TABS tablet [id number] No Take 600 mg by mouth daily. [provider] Taking Active    DULoxetine (CYMBALTA) 60 MG capsule [id number]  TAKE 1 CAPSULE BY MOUTH EVERY DAY [place], Alexa G, MD  Active   ELIQUIS 5 MG TABS tablet [id number]  TAKE  1 TABLET BY MOUTH TWICE A DAY [place], Alexa G, MD  Active   ezetimibe (ZETIA) 10 MG tablet [id number]  Take 1 tablet (10 mg total) by mouth daily. [place], Arneshia G, MD  Active   glucose blood (ACCU-CHEK GUIDE) test strip [id number] No Use to test blood sugars 1-2 times daily. Dx:e11.65 [place], Vineta G, MD Taking Active   Homeopathic Products [facility] RELIEF EX) [id number] No Apply 1 application topically daily as needed (foot cramps). [provider] Taking Active Child  ketoconazole (NIZORAL) 2 % cream [id number] No SMARTSIG:1 Application Topical 1 to 2 Times Daily [provider] Taking Active   L-Lysine 500 MG TABS [id number] No Take 1 tablet by mouth daily. [provider] Taking Active   Lancet Devices [facility]) lancets [id number] No Use to test blood sugar once a day. [place], Karl G, MD Taking Active Child  metroNIDAZOLE (METROCREAM) 0.75 % cream [id number] No Apply topically 2 (two) times daily. Apply a thin layer twice a day to a clean dry face. Deeann Saint, MD Taking Active   Multiple Vitamin (MULTIVITAMIN) capsule [id number] No Take 1 capsule by mouth daily.  [provider] Taking Active Child  olmesartan (BENICAR) 20 MG tablet [id number]  TAKE 1 TABLET BY MOUTH EVERY DAY [place], Sumer G, MD  Active   pantoprazole (PROTONIX) 20 MG tablet [id number] No Take 1 tablet (20 mg total) by mouth daily. [place], Dailyn G, MD Taking Active   Pitavastatin Calcium (LIVALO) 2 MG TABS [id number]  Take 1 tablet (2 mg total) by mouth daily. [place], Bird G, MD  Active   Vibegron (GEMTESA PO) [id number] No Take by mouth once. [provider] Taking Active             SDOH:  (Social Determinants of Health) assessments and interventions performed: Yes SDOH Interventions    Flowsheet Row Care  Coordination from 07/15/2022 in [facility] Office Visit from 02/16/2022 in [facility] [place] Chronic Care Management from 01/14/2022 in [facility] [place] Office Visit from 12/28/2021 in [id number] [place] [facility] [place] Office Visit from 06/29/2021 in [facility] [place] Office Visit from 03/31/2021 in [facility] [place]  SDOH Interventions        Food Insecurity Interventions Intervention Not Indicated Intervention Not Indicated -- -- -- --  Housing Interventions -- Intervention Not Indicated -- -- -- --  Transportation Interventions -- Intervention Not Indicated -- -- -- --  Utilities Interventions -- Intervention Not Indicated -- -- -- --  Alcohol Usage Interventions -- Intervention Not Indicated (Score <7) -- -- -- --  Depression Interventions/Treatment  -- -- -- Currently on Treatment Currently on Treatment Medication  Financial Strain Interventions -- Intervention Not Indicated Intervention Not Indicated -- -- --  Physical Activity Interventions -- Intervention Not Indicated -- -- -- --  Stress Interventions -- Intervention Not Indicated -- -- -- --  Social Connections Interventions -- Intervention Not Indicated -- -- -- --       Medication Assistance: None required.  Patient affirms current coverage meets needs.  Medication Access: Within the past 30 days, how often has patient missed a dose of medication? None Is a pillbox or other method used to improve adherence? Yes  Factors that may affect medication adherence? no barriers identified Are meds synced by current pharmacy? No  Are meds delivered by current pharmacy? No  Does patient experience delays in picking up medications due to transportation  concerns? No   [facility] Reviewed: Is patient disadvantaged to use [facility]?: Yes  Current Rx insurance plan: UHC/Tricare Name and location of  Current pharmacy:  [id number] [place], [place] - [address] [place] [address] Phone: [phone] Fax: [phone]  [facility] [address] [place] [address] Phone: [phone] Fax: [phone]  [facility] services reviewed with patient today?: No  Patient requests to transfer care to [facility]?: No  Reason patient declined to change pharmacies: Disadvantaged due to insurance/mail order  Compliance/Adherence/Medication fill history: Care Gaps: AWV - completed 02/16/2022,  scheduled 02/21/2023 Last eye exam - 09/21/2020 Last foot exam - 08/17/2021 Last BP - 131/68 on 02/28/2022 Last A1C - 6.6 on 12/28/2021 Covid - overdue Urine ACR - overdue  [facility]: Livalo 2 mg - [facility]  Olmesartan 20 mg - [facility]22 [facility]   Assessment/Plan   Hyperlipidemia: (LDL goal < 70) -Uncontrolled -Current treatment: Livalo  1 qd Appropriate, Query Effective Zetia  1 qd -Medications previously tried: Lipitor, Pravastatin -Current dietary patterns: Mediterranean diet  -Current exercise habits: Admits to no particular routine or regimen but still does her own housework and walks quite a bit -Educated on Cholesterol goals;  Benefits of statin for ASCVD risk reduction; Importance of limiting foods high in cholesterol; -Counseled on diet and exercise extensively Recommended to continue current medication -Due for updated labs in October, would need to consider an increase in Livalo if LDL still not at goal  Diabetes (A1c goal <7%) -Controlled -Current medications: None Appropriate, Effective, Safe, Accessible -Medications previously tried: None  -Current home glucose readings Once a week checking -Denies hypoglycemic/hyperglycemic symptoms -Current meal patterns:  Follows Mediterranean diet drinks: water -Current exercise:  see above -Educated on A1c and blood sugar goals; Complications of diabetes including kidney damage, retinal damage, and cardiovascular disease; Exercise goal of 150 minutes per week; Benefits of routine self-monitoring of blood sugar; -Counseled to check feet daily and get yearly eye exams -Counseled on diet and exercise extensively  Sherrill Raring Clinical Pharmacist [phone]

## 2022-07-15 ENCOUNTER — Telehealth: Payer: Self-pay

## 2022-07-15 ENCOUNTER — Ambulatory Visit: Payer: Medicare Other

## 2022-07-15 DIAGNOSIS — E2839 Other primary ovarian failure: Secondary | ICD-10-CM

## 2022-07-15 NOTE — Telephone Encounter (Signed)
-----   Message from Sherrill Raring, [facility] sent at 07/15/2022 11:26 AM EDT ----- Regarding: DEXA Pt is requesting an order for an updated DEXA scan (last 2018), if an order could be placed please!  Thank you, Sherrill Raring Clinical Pharmacist [phone]

## 2022-08-17 ENCOUNTER — Encounter: Payer: Self-pay | Admitting: Podiatry

## 2022-08-17 ENCOUNTER — Ambulatory Visit (INDEPENDENT_AMBULATORY_CARE_PROVIDER_SITE_OTHER): Payer: Medicare Other | Admitting: Podiatry

## 2022-08-17 DIAGNOSIS — E1142 Type 2 diabetes mellitus with diabetic polyneuropathy: Secondary | ICD-10-CM | POA: Diagnosis not present

## 2022-08-17 DIAGNOSIS — B351 Tinea unguium: Secondary | ICD-10-CM | POA: Diagnosis not present

## 2022-08-17 DIAGNOSIS — M79675 Pain in left toe(s): Secondary | ICD-10-CM

## 2022-08-17 DIAGNOSIS — M79674 Pain in right toe(s): Secondary | ICD-10-CM | POA: Diagnosis not present

## 2022-08-17 NOTE — Progress Notes (Signed)
This patient returns to my office for at risk foot care.  This patient requires this care by a professional since this patient will be at risk due to having CKD, DM and neuropathy. This patient is unable to cut nails herself since the patient cannot reach her nails.These nails are painful walking and wearing shoes.  This patient presents for at risk foot care today.  General Appearance  Alert, conversant and in no acute stress.  Vascular  Dorsalis pedis and posterior tibial  pulses are palpable  bilaterally.  Capillary return is within normal limits  bilaterally. Temperature is within normal limits  bilaterally.  Neurologic  Senn-Weinstein monofilament wire test within normal limits  bilaterally. Muscle power within normal limits bilaterally.  Nails Thick disfigured discolored nails with subungual debris  from hallux to fifth toes bilaterally. No evidence of bacterial infection or drainage bilaterally.  Orthopedic  No limitations of motion  feet .  No crepitus or effusions noted.  No bony pathology or digital deformities noted.  Skin  normotropic skin with no porokeratosis noted bilaterally.  No signs of infections or ulcers noted.     Onychomycosis  Pain in right toes  Pain in left toes  Consent was obtained for treatment procedures.   Mechanical debridement of nails 1-5  bilaterally performed with a nail nipper.  Filed with dremel without incident.    Return office visit   3 months                   Told patient to return for periodic foot care and evaluation due to potential at risk complications.   Helane Gunther DPM

## 2022-09-05 ENCOUNTER — Other Ambulatory Visit: Payer: Self-pay

## 2022-09-05 ENCOUNTER — Emergency Department (HOSPITAL_BASED_OUTPATIENT_CLINIC_OR_DEPARTMENT_OTHER): Payer: Medicare Other

## 2022-09-05 ENCOUNTER — Emergency Department (HOSPITAL_BASED_OUTPATIENT_CLINIC_OR_DEPARTMENT_OTHER)
Admission: EM | Admit: 2022-09-05 | Discharge: 2022-09-05 | Disposition: A | Discharge status: Home / Self Care | Payer: Medicare Other | Attending: Emergency Medicine | Admitting: Emergency Medicine

## 2022-09-05 DIAGNOSIS — R079 Chest pain, unspecified: Secondary | ICD-10-CM

## 2022-09-05 DIAGNOSIS — R0789 Other chest pain: Secondary | ICD-10-CM | POA: Diagnosis not present

## 2022-09-05 DIAGNOSIS — E785 Hyperlipidemia, unspecified: Secondary | ICD-10-CM | POA: Diagnosis not present

## 2022-09-05 DIAGNOSIS — I10 Essential (primary) hypertension: Secondary | ICD-10-CM | POA: Diagnosis not present

## 2022-09-05 DIAGNOSIS — Z86711 Personal history of pulmonary embolism: Secondary | ICD-10-CM | POA: Insufficient documentation

## 2022-09-05 DIAGNOSIS — Z7901 Long term (current) use of anticoagulants: Secondary | ICD-10-CM | POA: Insufficient documentation

## 2022-09-05 DIAGNOSIS — R059 Cough, unspecified: Secondary | ICD-10-CM | POA: Insufficient documentation

## 2022-09-05 DIAGNOSIS — Z1152 Encounter for screening for COVID-19: Secondary | ICD-10-CM | POA: Insufficient documentation

## 2022-09-05 LAB — CBC WITH DIFFERENTIAL/PLATELET
Abs Immature Granulocytes: 0.01 10*3/uL (ref 0.00–0.07)
Basophils Absolute: 0 10*3/uL (ref 0.0–0.1)
Basophils Relative: 0 %
Eosinophils Absolute: 0 10*3/uL (ref 0.0–0.5)
Eosinophils Relative: 1 %
HCT: 40.7 % (ref 36.0–46.0)
Hemoglobin: 14.1 g/dL (ref 12.0–15.0)
Immature Granulocytes: 0 %
Lymphocytes Relative: 14 %
Lymphs Abs: 0.8 10*3/uL (ref 0.7–4.0)
MCH: 30.9 pg (ref 26.0–34.0)
MCHC: 34.6 g/dL (ref 30.0–36.0)
MCV: 89.3 fL (ref 80.0–100.0)
Monocytes Absolute: 0.5 10*3/uL (ref 0.1–1.0)
Monocytes Relative: 8 %
Neutro Abs: 4.7 10*3/uL (ref 1.7–7.7)
Neutrophils Relative %: 77 %
Platelets: 186 10*3/uL (ref 150–400)
RBC: 4.56 MIL/uL (ref 3.87–5.11)
RDW: 13 % (ref 11.5–15.5)
WBC: 6.2 10*3/uL (ref 4.0–10.5)
nRBC: 0 % (ref 0.0–0.2)

## 2022-09-05 LAB — TROPONIN I (HIGH SENSITIVITY)
Troponin I (High Sensitivity): 4 ng/L (ref <18)
Troponin I (High Sensitivity): 3 ng/L (ref <18)

## 2022-09-05 LAB — COMPREHENSIVE METABOLIC PANEL
ALT: 19 U/L (ref 0–44)
AST: 23 U/L (ref 15–41)
Albumin: 3.9 g/dL (ref 3.5–5.0)
Alkaline Phosphatase: 39 U/L (ref 38–126)
Anion gap: 10 (ref 5–15)
BUN: 18 mg/dL (ref 8–23)
CO2: 20 mmol/L — ABNORMAL LOW (ref 22–32)
Calcium: 9.3 mg/dL (ref 8.9–10.3)
Chloride: 104 mmol/L (ref 98–111)
Creatinine, Ser: 0.98 mg/dL (ref 0.44–1.00)
GFR, Estimated: 58 mL/min — ABNORMAL LOW (ref >60)
Glucose, Bld: 115 mg/dL — ABNORMAL HIGH (ref 70–99)
Potassium: 3.8 mmol/L (ref 3.5–5.1)
Sodium: 134 mmol/L — ABNORMAL LOW (ref 135–145)
Total Bilirubin: 0.5 mg/dL (ref 0.3–1.2)
Total Protein: 6.5 g/dL (ref 6.5–8.1)

## 2022-09-05 LAB — RESP PANEL BY RT-PCR (RSV, FLU A&B, COVID)  RVPGX2
Influenza A by PCR: NEGATIVE
Influenza B by PCR: NEGATIVE
Resp Syncytial Virus by PCR: NEGATIVE
SARS Coronavirus 2 by RT PCR: NEGATIVE

## 2022-09-05 NOTE — Discharge Instructions (Signed)
Please return for worsening pain especially upon exertion or if you start coughing up blood or if you pass out.

## 2022-09-05 NOTE — ED Provider Notes (Signed)
Received patient in turnover from Dr. Judd Lien.  Please see their note for further details of Hx, PE.  Briefly patient is a 82 y.o. female with a Chest Pain .  Started on night.  Reportedly atypical.  Plan for second troponin and if negative likely discharge home with outpatient cardiology follow-up.  Second troponin is resulted negative.  Discussed results with the patient.  She says is completely different than when she had a pulmonary embolism in the past.  Comfortable with discharge.  PCP follow-up.    Melene Plan, DO 09/05/22 [phone]

## 2022-09-05 NOTE — ED Provider Notes (Signed)
[place] EMERGENCY DEPARTMENT AT [facility] Provider Note   CSN: [id number] Arrival date & time: 09/05/22  0431     History  Chief Complaint  Patient presents with   Chest Pain    Alexa Brown is a 82 y.o. female.  Patient is an 82 year old female with past medical history of hypertension, prior pulmonary embolism on Eliquis, hyperlipidemia.  Patient presenting today for evaluation of chest discomfort.  This started approximately 2 hours prior to presentation while she was sleeping.  She describes the sensation of something sitting on her chest with no associated shortness of breath, nausea, diaphoresis, or radiation to the arm or jaw.  She denies any recent exertional symptoms.  Patient is here accompanied by her daughter and says that she has seemed somewhat weaker since yesterday and also had some loose stools.  Patient has no prior cardiac history, but did have a normal nuclear stress test in 2018.  No aggravating or alleviating factors.  The history is provided by the patient.       Home Medications Prior to Admission medications   Medication Sig Start Date End Date Taking? Authorizing Provider  oxybutynin (DITROPAN-XL) 10 MG 24 hr tablet Take 10 mg by mouth daily. 06/29/22  Yes [provider]  Accu-Chek Softclix Lancets lancets Use to check blood sugars 1-2 times daily. Dx:E11.65 10/18/21   [place], Heidy G, MD  acetaminophen (TYLENOL) 325 MG tablet Take 650 mg by mouth every 6 (six) hours as needed for mild pain, fever or headache.    [provider]  allopurinol (ZYLOPRIM) 100 MG tablet TAKE 1 TABLET BY MOUTH TWICE A DAY 04/13/22   [place], Brynlee G, MD  Alpha-Lipoic Acid 600 MG TABS Take 600 mg by mouth daily.    [provider]  b complex vitamins capsule Take 1 capsule by mouth daily.    [provider]  Blood Glucose Monitoring Suppl (ACCU-CHEK GUIDE) w/Device KIT Use to check blood sugars 1-2 times daily. Dx:E11.65  10/18/21   [place], Melenda G, MD  buPROPion ER [facility] SR) 100 MG 12 hr tablet TAKE 1 TABLET BY MOUTH TWICE A DAY 05/23/22   [place], Kayana G, MD  calcium carbonate (CALCIUM 600) 600 MG TABS tablet Take 600 mg by mouth daily.    [provider]  DULoxetine (CYMBALTA) 60 MG capsule TAKE 1 CAPSULE BY MOUTH EVERY DAY 05/23/22   [place], Millianna G, MD  ELIQUIS 5 MG TABS tablet TAKE 1 TABLET BY MOUTH TWICE A DAY 07/13/22   [place], Denora G, MD  ezetimibe (ZETIA) 10 MG tablet Take 1 tablet (10 mg total) by mouth daily. 01/02/22   [place], Eshani G, MD  glucose blood (ACCU-CHEK GUIDE) test strip Use to test blood sugars 1-2 times daily. Dx:e11.65 10/18/21   [place], Abbegail G, MD  Homeopathic Products [facility] RELIEF EX) Apply 1 application topically daily as needed (foot cramps).    [provider]  ketoconazole (NIZORAL) 2 % cream SMARTSIG:1 Application Topical 1 to 2 Times Daily 11/06/20   [provider]  L-Lysine 500 MG TABS Take 1 tablet by mouth daily.    [provider]  Lancet Devices [facility]) lancets Use to test blood sugar once a day. 03/31/16   [place], Janita G, MD  metroNIDAZOLE (METROCREAM) 0.75 % cream Apply topically 2 (two) times daily. Apply a thin layer twice a day to a clean dry face. 03/11/21   Deeann Saint, MD  Multiple Vitamin (MULTIVITAMIN) capsule Take 1 capsule  by mouth daily.     [provider]  olmesartan (BENICAR) 20 MG tablet TAKE 1 TABLET BY MOUTH EVERY DAY 04/13/22   [place], Kyan G, MD  pantoprazole (PROTONIX) 20 MG tablet Take 1 tablet (20 mg total) by mouth daily. 06/01/21   [place], Takayla G, MD  Pitavastatin Calcium (LIVALO) 2 MG TABS Take 1 tablet (2 mg total) by mouth daily. 01/02/22   [place], Rashanda G, MD      Allergies    Patient has no known allergies.    Review of Systems   Review of Systems  All other systems reviewed and are negative.   Physical Exam Updated Vital Signs BP (!) 140/59 (BP Location: Right Arm)    Pulse 77   Temp 98.6 F (37 C) (Oral)   Resp 20   Ht 5\' 7"  (1.702 m)   SpO2 93%   BMI 25.37 kg/m  Physical Exam Vitals and nursing note reviewed.  Constitutional:      General: She is not in acute distress.    Appearance: She is well-developed. She is not diaphoretic.  HENT:     Head: Normocephalic and atraumatic.  Cardiovascular:     Rate and Rhythm: Normal rate and regular rhythm.     Heart sounds: No murmur heard.    No friction rub. No gallop.  Pulmonary:     Effort: Pulmonary effort is normal. No respiratory distress.     Breath sounds: Normal breath sounds. No wheezing.  Abdominal:     General: Bowel sounds are normal. There is no distension.     Palpations: Abdomen is soft.     Tenderness: There is no abdominal tenderness.  Musculoskeletal:        General: Normal range of motion.     Cervical back: Normal range of motion and neck supple.     Right lower leg: No tenderness. No edema.     Left lower leg: No tenderness. No edema.  Skin:    General: Skin is warm and dry.  Neurological:     General: No focal deficit present.     Mental Status: She is alert and oriented to person, place, and time.     ED Results / Procedures / Treatments   Labs (all labs ordered are listed, but only abnormal results are displayed) Labs Reviewed - No data to display  EKG EKG Interpretation  Date/Time:  Monday September 05 2022 04:44:09 EDT Ventricular Rate:  77 PR Interval:  157 QRS Duration: 85 QT Interval:  362 QTC Calculation: 410 R Axis:   88 Text Interpretation: Sinus rhythm Borderline right axis deviation No significant change since 04/13/2022 Confirmed by Geoffery Lyons ([id number]) on 09/05/2022 5:05:26 AM  Radiology No results found.  Procedures Procedures  {Document cardiac monitor, telemetry assessment procedure when appropriate:1}  Medications Ordered in ED Medications - No data to display  ED Course/ Medical Decision Making/ A&P   {   Click here for ABCD2, HEART  and other calculatorsREFRESH Note before signing :1}                          Medical Decision Making  ***  {Document critical care time when appropriate:1} {Document review of labs and clinical decision tools ie heart score, Chads2Vasc2 etc:1}  {Document your independent review of radiology images, and any outside records:1} {Document your discussion with family members, caretakers, and with consultants:1} {Document social determinants of health affecting pt's care:1} {Document your decision making  why or why not admission, treatments were needed:1} Final Clinical Impression(s) / ED Diagnoses Final diagnoses:  None    Rx / DC Orders ED Discharge Orders     None

## 2022-09-05 NOTE — ED Notes (Signed)
MD at bedside. 

## 2022-09-05 NOTE — ED Notes (Signed)
Discharge instructions and follow up care reviewed and explained, pt verbalized understanding and had no further questions on d/c. Pt taken to daughter's POV via wheelchair.

## 2022-09-05 NOTE — ED Triage Notes (Addendum)
Pt arrived c/o midsternal chest pain that started yesterday. Pt describes as a constant pain and sharp.  Also c/o of diarrhea (3-4 times since yesterday). Family states she has been sob since yesterday as well (with exertion) Not sob at present.  Low grade fever at home. Pt c/o of general weakness.

## 2022-09-09 ENCOUNTER — Telehealth: Payer: Self-pay

## 2022-09-09 NOTE — Telephone Encounter (Signed)
Transition Care Management Follow-up Telephone Call Date of discharge and from where: [facility] 6/10 How have you been since you were released from the hospital? Much better Any questions or concerns? No  Items Reviewed: Did the pt receive and understand the discharge instructions provided? Yes  Medications obtained and verified? Yes  Other? No  Any new allergies since your discharge? No  Dietary orders reviewed? No Do you have support at home? Yes     Follow up appointments reviewed:  PCP Hospital f/u appt confirmed? No Scheduled to see  on  @ . Specialist Hospital f/u appt confirmed? No  Scheduled to see  on  @ . Are transportation arrangements needed? No  If their condition worsens, is the pt aware to call PCP or go to the Emergency Dept.? Yes Was the patient provided with contact information for the PCP's office or ED? Yes Was to pt encouraged to call back with questions or concerns? Yes

## 2022-09-12 ENCOUNTER — Emergency Department (HOSPITAL_COMMUNITY)
Admission: EM | Admit: 2022-09-12 | Discharge: 2022-09-12 | Disposition: A | Discharge status: Home / Self Care | Payer: Medicare Other | Attending: Emergency Medicine | Admitting: Emergency Medicine

## 2022-09-12 ENCOUNTER — Emergency Department (HOSPITAL_COMMUNITY): Payer: Medicare Other

## 2022-09-12 ENCOUNTER — Other Ambulatory Visit: Payer: Self-pay

## 2022-09-12 DIAGNOSIS — W01198A Fall on same level from slipping, tripping and stumbling with subsequent striking against other object, initial encounter: Secondary | ICD-10-CM | POA: Insufficient documentation

## 2022-09-12 DIAGNOSIS — Y92009 Unspecified place in unspecified non-institutional (private) residence as the place of occurrence of the external cause: Secondary | ICD-10-CM | POA: Insufficient documentation

## 2022-09-12 DIAGNOSIS — S0181XA Laceration without foreign body of other part of head, initial encounter: Secondary | ICD-10-CM | POA: Insufficient documentation

## 2022-09-12 DIAGNOSIS — W19XXXA Unspecified fall, initial encounter: Secondary | ICD-10-CM | POA: Diagnosis not present

## 2022-09-12 DIAGNOSIS — Z743 Need for continuous supervision: Secondary | ICD-10-CM | POA: Diagnosis not present

## 2022-09-12 DIAGNOSIS — M4312 Spondylolisthesis, cervical region: Secondary | ICD-10-CM | POA: Diagnosis not present

## 2022-09-12 DIAGNOSIS — M47812 Spondylosis without myelopathy or radiculopathy, cervical region: Secondary | ICD-10-CM | POA: Diagnosis not present

## 2022-09-12 DIAGNOSIS — S0990XA Unspecified injury of head, initial encounter: Secondary | ICD-10-CM | POA: Diagnosis not present

## 2022-09-12 DIAGNOSIS — Y9301 Activity, walking, marching and hiking: Secondary | ICD-10-CM | POA: Insufficient documentation

## 2022-09-12 DIAGNOSIS — S0993XA Unspecified injury of face, initial encounter: Secondary | ICD-10-CM | POA: Diagnosis present

## 2022-09-12 LAB — CBC WITH DIFFERENTIAL/PLATELET
Abs Immature Granulocytes: 0.05 10*3/uL (ref 0.00–0.07)
Basophils Absolute: 0 10*3/uL (ref 0.0–0.1)
Basophils Relative: 1 %
Eosinophils Absolute: 0.2 10*3/uL (ref 0.0–0.5)
Eosinophils Relative: 3 %
HCT: 40.1 % (ref 36.0–46.0)
Hemoglobin: 13.6 g/dL (ref 12.0–15.0)
Immature Granulocytes: 1 %
Lymphocytes Relative: 30 %
Lymphs Abs: 2.1 10*3/uL (ref 0.7–4.0)
MCH: 30.4 pg (ref 26.0–34.0)
MCHC: 33.9 g/dL (ref 30.0–36.0)
MCV: 89.5 fL (ref 80.0–100.0)
Monocytes Absolute: 0.6 10*3/uL (ref 0.1–1.0)
Monocytes Relative: 8 %
Neutro Abs: 4.2 10*3/uL (ref 1.7–7.7)
Neutrophils Relative %: 57 %
Platelets: 288 10*3/uL (ref 150–400)
RBC: 4.48 MIL/uL (ref 3.87–5.11)
RDW: 12.5 % (ref 11.5–15.5)
WBC: 7.2 10*3/uL (ref 4.0–10.5)
nRBC: 0 % (ref 0.0–0.2)

## 2022-09-12 LAB — BASIC METABOLIC PANEL
Anion gap: 11 (ref 5–15)
BUN: 17 mg/dL (ref 8–23)
CO2: 23 mmol/L (ref 22–32)
Calcium: 9.8 mg/dL (ref 8.9–10.3)
Chloride: 104 mmol/L (ref 98–111)
Creatinine, Ser: 1.13 mg/dL — ABNORMAL HIGH (ref 0.44–1.00)
GFR, Estimated: 49 mL/min — ABNORMAL LOW (ref >60)
Glucose, Bld: 180 mg/dL — ABNORMAL HIGH (ref 70–99)
Potassium: 4.6 mmol/L (ref 3.5–5.1)
Sodium: 138 mmol/L (ref 135–145)

## 2022-09-12 MED ORDER — ACETAMINOPHEN 500 MG PO TABS
1000.0000 mg | ORAL_TABLET | Freq: Once | ORAL | Status: AC
  Administered 2022-09-12: 1000 mg via ORAL
  Filled 2022-09-12: qty 2

## 2022-09-12 MED ORDER — LIDOCAINE HCL (PF) 1 % IJ SOLN
30.0000 mL | Freq: Once | INTRAMUSCULAR | Status: AC
  Administered 2022-09-12: 30 mL via INTRADERMAL
  Filled 2022-09-12: qty 30

## 2022-09-12 NOTE — ED Notes (Signed)
Family at bedside, Patient is A&Ox4 no confusion. No verbal c/o pain or discomfort at this time. Patient able to follow all verbal commands. Will continue to monitor

## 2022-09-12 NOTE — ED Notes (Addendum)
Patient is A&Ox4 no confusion.  Patient able to follow all verbal commands. Family at bedside. Will continue to monitor

## 2022-09-12 NOTE — ED Notes (Signed)
Patient back from CT.

## 2022-09-12 NOTE — ED Notes (Signed)
Patient is A&Ox4, able to follow verbal commands. Head bleeding to right side controlled. Will continue to monitor

## 2022-09-12 NOTE — ED Provider Notes (Signed)
[facility] AT [facility] Provider Note   CSN: [id number] Arrival date & time: 09/12/22  1143     History No chief complaint on file.   HPI Alexa Brown is a 82 y.o. female presenting for chief complaint ground-level fall.  States that she lost her balance with her walker and tilted over the side hitting her forehead on the concrete.  Has a large laceration to her forehead.  She denies fevers or chills, nausea or vomiting, syncope shortness of breath.  Otherwise states that nothing else is bothering her denies any hip or back leg extremity pain. Patient's recorded medical, surgical, social, medication list and allergies were reviewed in the Snapshot window as part of the initial history.   Review of Systems   Review of Systems  Constitutional:  Negative for chills and fever.  HENT:  Negative for ear pain and sore throat.   Eyes:  Negative for pain and visual disturbance.  Respiratory:  Negative for cough and shortness of breath.   Cardiovascular:  Negative for chest pain and palpitations.  Gastrointestinal:  Negative for abdominal pain and vomiting.  Genitourinary:  Negative for dysuria and hematuria.  Musculoskeletal:  Negative for arthralgias and back pain.  Skin:  Negative for color change and rash.  Neurological:  Negative for seizures and syncope.  All other systems reviewed and are negative.   Physical Exam Updated Vital Signs There were no vitals taken for this visit. Physical Exam Vitals and nursing note reviewed.  Constitutional:      General: She is not in acute distress.    Appearance: She is well-developed.  HENT:     Head: Normocephalic and atraumatic.  Eyes:     Conjunctiva/sclera: Conjunctivae normal.  Cardiovascular:     Rate and Rhythm: Normal rate and regular rhythm.     Heart sounds: No murmur heard. Pulmonary:     Effort: Pulmonary effort is normal. No respiratory distress.     Breath sounds: Normal breath  sounds.  Abdominal:     General: There is no distension.     Palpations: Abdomen is soft.     Tenderness: There is no abdominal tenderness. There is no right CVA tenderness or left CVA tenderness.  Musculoskeletal:        General: Signs of injury (2.75 cm curvilinear laceration to the right forehead.) present. No swelling or tenderness. Normal range of motion.     Cervical back: Neck supple.  Skin:    General: Skin is warm and dry.  Neurological:     General: No focal deficit present.     Mental Status: She is alert and oriented to person, place, and time. Mental status is at baseline.     Cranial Nerves: No cranial nerve deficit.      ED Course/ Medical Decision Making/ A&P    Procedures .Critical Care  Performed by: Glyn Ade, MD Authorized by: Glyn Ade, MD   Critical care provider statement:    Critical care time (minutes):  30   Critical care was necessary to treat or prevent imminent or life-threatening deterioration of the following conditions:  Trauma   Critical care was time spent personally by me on the following activities:  Development of treatment plan with patient or surrogate, discussions with consultants, evaluation of patient's response to treatment, examination of patient, ordering and review of laboratory studies, ordering and review of radiographic studies, ordering and performing treatments and interventions, pulse oximetry, re-evaluation of patient's condition and review of  old charts .Marland KitchenLaceration Repair  Date/Time: 09/12/2022 3:49 PM  Performed by: Glyn Ade, MD Authorized by: Glyn Ade, MD   Consent:    Consent obtained:  Verbal   Consent given by:  Patient   Risks, benefits, and alternatives were discussed: yes     Risks discussed:  Infection, pain and poor cosmetic result Laceration details:    Location:  Face   Face location:  Forehead   Length (cm):  2.8    Medications Ordered in ED Medications - No data to  display Medical Decision Making:    Alexa Brown is a 82 y.o. female who presented to the ED today with a moderate mechanisma trauma, detailed above.    Patient's presentation is complicated by their history of multiple comorbid medical problems.  Patient placed on continuous vitals and telemetry monitoring while in ED which was reviewed periodically.   Given this mechanism of trauma, a full physical exam was performed. Notably, patient was hemodynamically stable no acute distress.   Reviewed and confirmed nursing documentation for past medical history, family history, social history.    Initial Assessment/Plan:   This is a patient presenting with a moderate mechanism trauma.  As such, I have considered intracranial injuries including intracranial hemorrhage, intrathoracic injuries including blunt myocardial or blunt lung injury, blunt abdominal injuries including aortic dissection, bladder injury, spleen injury, liver injury and I have considered orthopedic injuries including extremity or spinal injury.  With the patient's presentation of moderate mechanism trauma but an otherwise reassuring exam, patient warrants targeted evaluation for potential traumatic injuries. Will proceed with targeted evaluation for potential injuries. Will proceed with CT head, CT C-spine. Objective evaluation resulted with no acute pathology.   Disposition:  I have considered need for hospitalization, however, considering all of the above, I believe this patient is stable for discharge at this time.  Patient/family educated about specific return precautions for given chief complaint and symptoms.  Patient/family educated about follow-up with PCP.     Patient/family expressed understanding of return precautions and need for follow-up. Patient spoken to regarding all imaging and laboratory results and appropriate follow up for these results. All education provided in verbal form with additional information in  written form. Time was allowed for answering of patient questions. Patient discharged.    Emergency Department Medication Summary:   Medications  lidocaine (PF) (XYLOCAINE) 1 % injection 30 mL (has no administration in time range)  acetaminophen (TYLENOL) tablet 1,000 mg (has no administration in time range)         Clinical Impression: No diagnosis found.   Data Unavailable   Final Clinical Impression(s) / ED Diagnoses Final diagnoses:  None    Rx / DC Orders ED Discharge Orders     None         Glyn Ade, MD 09/12/22 1550

## 2022-09-12 NOTE — Progress Notes (Signed)
   09/12/22 1210  Spiritual Encounters  Type of Visit Initial  Care provided to: Patient;Pt and family  Conversation partners present during encounter Nurse  Referral source Trauma page  Reason for visit Trauma  OnCall Visit No   Chaplain responded to [facility] on [place] call.  PT was alert and talkitive; she told me her daughter would be in thewaiting room.  I asked permission to share her condition with her daughter and she granted it.  I found daughter in the waiting area and updated her, letting her know that as soon as the PT was back fro CT scans they would come and get her.

## 2022-09-12 NOTE — ED Notes (Signed)
Patient transported to CT by nurse. 

## 2022-09-12 NOTE — ED Triage Notes (Signed)
Patient BIB EMS from home, while walking fall hit right side of head on sidewalk, bleeding controlled. A&Ox4,patient on Eliquis. VSS

## 2022-09-12 NOTE — Progress Notes (Signed)
Orthopedic Tech Progress Note Patient Details:  Alexa Brown March 15, 1941 [id number]  Level 2 trauma Patient ID: Alexa Brown, female   DOB: 01/25/1941, 82 y.o.   MRN: [id number]  Alexa Brown 09/12/2022, 1:54 PM

## 2022-09-12 NOTE — ED Notes (Signed)
Patient BIB EMS from home, while walking fall hit right side of head on sidewalk, bleeding controlled. A&Ox4,patient on Eliquis. VSS   

## 2022-09-14 NOTE — Progress Notes (Unsigned)
Chief Complaint  Patient presents with   Follow-up   HPI: [name] is a 82 y.o. female with past medical history significant for DM 2, peripheral neuropathy, PE on chronic anticoagulation, CVA, hyperlipidemia, hypertension, and CKD here today to follow on recent ED visit. She was evaluated in the ED on 09/05/2022 for chest pain and diarrhea and on 09/12/2022 after a fall.  Regarding the fall, she reports that it occurred while she was walking outside with a new care giver. She fell while attempting to navigate a dip and then go up another dip on a sidewalk without curb cuts. The fall resulted in a laceration above right eye brow that required stitches. She denies experiencing any unusual headache or visual changes since the incident.  09/05/22: High sensitive troponin in normal range (3 and 4).  CP has resolved. Influenza and COVID-19 testing negative. CKD III: She has not noted gross hematuria or foam in urine. E GFR was 49.  HFpEF: Negative for edema,orthopnea,or PND.  Lab Results  Component Value Date   CREATININE 1.13 (H) 09/12/2022   BUN 17 09/12/2022   NA 138 09/12/2022   K 4.6 09/12/2022   CL 104 09/12/2022   CO2 23 09/12/2022   Lab Results  Component Value Date   WBC 7.2 09/12/2022   HGB 13.6 09/12/2022   HCT 40.1 09/12/2022   MCV 89.5 09/12/2022   PLT 288 09/12/2022   Head CT done on 09/12/2022 was negative for acute intracranial abnormality, right periorbital/forehead laceration.  Parenchymal atrophy, chronic small vessel ischemic disease and chronic infarcts.  Minor paranasal sinus disease. Cervical CT was also negative for acute cervical spine fracture.  Mild grade 1 anterolisthesis at C3-C4 and C4-C5.  Review of Systems  Constitutional:  Negative for chills and fever.  HENT:  Negative for mouth sores and sore throat.   Respiratory:  Positive for cough (occasional non productive.). Negative for shortness of breath and wheezing.   Cardiovascular:   Negative for chest pain and palpitations.  Gastrointestinal:  Negative for abdominal pain, nausea and vomiting.  Genitourinary:  Negative for decreased urine volume, dysuria and hematuria.  Skin:  Negative for rash.  Neurological:  Negative for syncope, facial asymmetry and headaches.  See other pertinent positives and negatives in HPI.  Current Outpatient Medications on File Prior to Visit  Medication Sig Dispense Refill   Accu-Chek Softclix Lancets lancets Use to check blood sugars 1-2 times daily. Dx:E11.65 200 each 12   acetaminophen (TYLENOL) 325 MG tablet Take 650 mg by mouth every 6 (six) hours as needed for mild pain, fever or headache.     allopurinol (ZYLOPRIM) 100 MG tablet TAKE 1 TABLET BY MOUTH TWICE A DAY 180 tablet 1   Alpha-Lipoic Acid 600 MG TABS Take 600 mg by mouth daily.     b complex vitamins capsule Take 1 capsule by mouth daily.     Blood Glucose Monitoring Suppl (ACCU-CHEK GUIDE) w/Device KIT Use to check blood sugars 1-2 times daily. Dx:E11.65 1 kit 0   buPROPion ER (WELLBUTRIN SR) 100 MG 12 hr tablet TAKE 1 TABLET BY MOUTH TWICE A DAY 180 tablet 1   calcium carbonate (CALCIUM 600) 600 MG TABS tablet Take 600 mg by mouth daily.     DULoxetine (CYMBALTA) 60 MG capsule TAKE 1 CAPSULE BY MOUTH EVERY DAY 90 capsule 1   ELIQUIS 5 MG TABS tablet TAKE 1 TABLET BY MOUTH TWICE A DAY 180 tablet 3   ezetimibe (ZETIA) 10 MG tablet Take  1 tablet (10 mg total) by mouth daily. 90 tablet 3   glucose blood (ACCU-CHEK GUIDE) test strip Use to test blood sugars 1-2 times daily. Dx:e11.65 200 each 12   Homeopathic Products (THERAWORX RELIEF EX) Apply 1 application topically daily as needed (foot cramps).     ketoconazole (NIZORAL) 2 % cream SMARTSIG:1 Application Topical 1 to 2 Times Daily     L-Lysine 500 MG TABS Take 1 tablet by mouth daily.     Lancet Devices (ACCU-CHEK SOFTCLIX) lancets Use to test blood sugar once a day. 100 each 2   metroNIDAZOLE (METROCREAM) 0.75 % cream Apply  topically 2 (two) times daily. Apply a thin layer twice a day to a clean dry face. 45 g 0   Multiple Vitamin (MULTIVITAMIN) capsule Take 1 capsule by mouth daily.      olmesartan (BENICAR) 20 MG tablet TAKE 1 TABLET BY MOUTH EVERY DAY 90 tablet 2   oxybutynin (DITROPAN-XL) 10 MG 24 hr tablet Take 10 mg by mouth daily.     pantoprazole (PROTONIX) 20 MG tablet Take 1 tablet (20 mg total) by mouth daily. 90 tablet 2   Pitavastatin Calcium (LIVALO) 2 MG TABS Take 1 tablet (2 mg total) by mouth daily. 90 tablet 3   No current facility-administered medications on file prior to visit.    Past Medical History:  Diagnosis Date   Chicken pox    Depression    Diabetes mellitus without complication (HCC)    GERD (gastroesophageal reflux disease)    Hyperlipidemia    Hypertension    Stroke [facility])    UTI (urinary tract infection)    No Known Allergies  Social History   Socioeconomic History   Marital status: Widowed    Spouse name: Not on file   Number of children: Not on file   Years of education: Not on file   Highest education level: Not on file  Occupational History   Occupation: N/A  Tobacco Use   Smoking status: Never   Smokeless tobacco: Never  Vaping Use   Vaping Use: Never used  Substance and Sexual Activity   Alcohol use: Yes    Comment: occassional wine   Drug use: No   Sexual activity: Not Currently  Other Topics Concern   Not on file  Social History Narrative   Pt lives in single story home with her daughter   Has 2 children   Some college education   Retired Copywriter, advertising for [employer]   Social Determinants of Health   Financial Resource Strain: Low Risk  (02/16/2022)   Overall Financial Resource Strain (CARDIA)    Difficulty of Paying Living Expenses: Not hard at all  Food Insecurity: No Food Insecurity (07/15/2022)   Hunger Vital Sign    Worried About Running Out of Food in the Last Year: Never true    Ran Out of Food in the Last Year: Never  true  Transportation Needs: No Transportation Needs (02/16/2022)   PRAPARE - Administrator, Civil Service (Medical): No    Lack of Transportation (Non-Medical): No  Physical Activity: Inactive (02/16/2022)   Exercise Vital Sign    Days of Exercise per Week: 0 days    Minutes of Exercise per Session: 0 min  Stress: No Stress Concern Present (02/16/2022)   [employer] of Occupational Health - Occupational Stress Questionnaire    Feeling of Stress : Not at all  Social Connections: Moderately Integrated (02/16/2022)   Social Connection and Isolation Panel [  NHANES]    Frequency of Communication with Friends and Family: More than three times a week    Frequency of Social Gatherings with Friends and Family: More than three times a week    Attends Religious Services: More than 4 times per year    Active Member of [employer] or Organizations: Yes    Attends Banker Meetings: More than 4 times per year    Marital Status: Widowed    Vitals:   09/16/22 1356  BP: 120/60  Pulse: 96  Resp: 16  SpO2: 94%   Body mass index is 26 kg/m.  Physical Exam Vitals and nursing note reviewed.  Constitutional:      General: She is not in acute distress.    Appearance: She is well-developed.  HENT:     Head: Normocephalic and atraumatic.     Mouth/Throat:     Mouth: Mucous membranes are moist.  Eyes:     Conjunctiva/sclera: Conjunctivae normal.  Cardiovascular:     Rate and Rhythm: Normal rate and regular rhythm.     Heart sounds: No murmur heard. Pulmonary:     Effort: Pulmonary effort is normal. No respiratory distress.     Breath sounds: Normal breath sounds.  Abdominal:     Palpations: Abdomen is soft.     Tenderness: There is no abdominal tenderness.  Musculoskeletal:     Right lower leg: No edema.     Left lower leg: No edema.  Skin:    General: Skin is warm.     Findings: Ecchymosis (Right perioccular.) present. No erythema or rash.     Comments: Linear  oblique laceration above right eye brow with 5 stitches.  Neurological:     General: No focal deficit present.     Mental Status: She is alert and oriented to person, place, and time.     Cranial Nerves: No cranial nerve deficit.     Gait: Gait normal.     Comments: Gait assisted with a walker  Psychiatric:        Mood and Affect: Mood and affect normal.   ASSESSMENT AND PLAN:  Ms. Darice was seen today for follow-up.  Diagnoses and all orders for this visit:  Laceration of forehead, subsequent encounter No signs of infection, healing appropriately. Continue keeping wound clean with soap and water. Will plan on removing sutures Tuesday, day 8th.  (HFpEF) heart failure with preserved ejection fraction (HCC) Last echo in 06/2020 with LVEF 70-75% and grade I diastolic dysfunction. Euvolemic. Continue low salt diet.  Unstable gait She has a walker to help with transfer. Fall precautions discussed.  CKD (chronic kidney disease), stage III (HCC) Stable overall. Stressed the importance of adequate hydration. Continue low salt diet and avoidance of NSAID's. She is on Olmesartan.  Gastroesophageal reflux disease, unspecified whether esophagitis present  Could have caused chest discomfort, which has resolved. Continue Protonix 20 mg daily and GERD precautions.  Return in about 4 days (around 09/20/2022), or if symptoms worsen or fail to improve, for 15 min appt, keep next appointment.  Lynann G. [place], MD  [facility]. [place] office.

## 2022-09-16 ENCOUNTER — Ambulatory Visit (INDEPENDENT_AMBULATORY_CARE_PROVIDER_SITE_OTHER): Payer: Medicare Other | Admitting: Family Medicine

## 2022-09-16 ENCOUNTER — Encounter: Payer: Self-pay | Admitting: Family Medicine

## 2022-09-16 VITALS — BP 120/60 | HR 96 | Resp 16 | Ht 67.0 in | Wt 166.0 lb

## 2022-09-16 DIAGNOSIS — N1831 Chronic kidney disease, stage 3a: Secondary | ICD-10-CM | POA: Diagnosis not present

## 2022-09-16 DIAGNOSIS — I5032 Chronic diastolic (congestive) heart failure: Secondary | ICD-10-CM

## 2022-09-16 DIAGNOSIS — S0181XD Laceration without foreign body of other part of head, subsequent encounter: Secondary | ICD-10-CM

## 2022-09-16 DIAGNOSIS — R2681 Unsteadiness on feet: Secondary | ICD-10-CM

## 2022-09-16 DIAGNOSIS — K219 Gastro-esophageal reflux disease without esophagitis: Secondary | ICD-10-CM

## 2022-09-16 NOTE — Patient Instructions (Addendum)
A few things to remember from today's visit:  Unstable gait  Chronic heart failure with preserved ejection fraction (HCC), Chronic  Stage 3a chronic kidney disease (HCC), Chronic  No changes today. Adequate hydration. Fall precautions. Will remove stitches Tuesday.  If you need refills for medications you take chronically, please call your pharmacy. Do not use My Chart to request refills or for acute issues that need immediate attention. If you send a my chart message, it may take a few days to be addressed, specially if I am not in the office.  Please be sure medication list is accurate. If a new problem present, please set up appointment sooner than planned today.

## 2022-09-17 NOTE — Assessment & Plan Note (Addendum)
Stable overall. Stressed the importance of adequate hydration. Continue low salt diet and avoidance of NSAID's. She is on Olmesartan.

## 2022-09-17 NOTE — Assessment & Plan Note (Signed)
Could have caused chest discomfort, which has resolved. Continue Protonix 20 mg daily and GERD precautions.

## 2022-09-17 NOTE — Assessment & Plan Note (Signed)
She has a walker to help with transfer. Fall precautions discussed.

## 2022-09-17 NOTE — Assessment & Plan Note (Signed)
Last echo in 06/2020 with LVEF 70-75% and grade I diastolic dysfunction. Euvolemic. Continue low salt diet.

## 2022-09-19 ENCOUNTER — Telehealth: Payer: Self-pay

## 2022-09-19 NOTE — Telephone Encounter (Signed)
Transition Care Management Follow-up Telephone Call Date of discharge and from where: [facility] 6/17 How have you been since you were released from the hospital? Doing well  Any questions or concerns? No  Items Reviewed: Did the pt receive and understand the discharge instructions provided? Yes  Medications obtained and verified? Yes  Other? No  Any new allergies since your discharge? No  Dietary orders reviewed? No Do you have support at home? Yes    Follow up appointments reviewed: PCP Hospital f/u appt confirmed? Yes  Scheduled to see  on  @ . Specialist Hospital f/u appt confirmed? Yes  Scheduled to see  on  @ . Are transportation arrangements needed? No  If their condition worsens, is the pt aware to call PCP or go to the Emergency Dept.? Yes Was the patient provided with contact information for the PCP's office or ED? Yes Was to pt encouraged to call back with questions or concerns? Yes

## 2022-09-20 NOTE — Progress Notes (Unsigned)
ACUTE VISIT No chief complaint on file.  HPI: Alexa Brown is a 82 y.o. female, who is here today complaining of *** HPI  Review of Systems See other pertinent positives and negatives in HPI.  Current Outpatient Medications on File Prior to Visit  Medication Sig Dispense Refill   Accu-Chek Softclix Lancets lancets Use to check blood sugars 1-2 times daily. Dx:E11.65 200 each 12   acetaminophen (TYLENOL) 325 MG tablet Take 650 mg by mouth every 6 (six) hours as needed for mild pain, fever or headache.     allopurinol (ZYLOPRIM) 100 MG tablet TAKE 1 TABLET BY MOUTH TWICE A DAY 180 tablet 1   Alpha-Lipoic Acid 600 MG TABS Take 600 mg by mouth daily.     b complex vitamins capsule Take 1 capsule by mouth daily.     Blood Glucose Monitoring Suppl (ACCU-CHEK GUIDE) w/Device KIT Use to check blood sugars 1-2 times daily. Dx:E11.65 1 kit 0   buPROPion ER (WELLBUTRIN SR) 100 MG 12 hr tablet TAKE 1 TABLET BY MOUTH TWICE A DAY 180 tablet 1   calcium carbonate (CALCIUM 600) 600 MG TABS tablet Take 600 mg by mouth daily.     DULoxetine (CYMBALTA) 60 MG capsule TAKE 1 CAPSULE BY MOUTH EVERY DAY 90 capsule 1   ELIQUIS 5 MG TABS tablet TAKE 1 TABLET BY MOUTH TWICE A DAY 180 tablet 3   ezetimibe (ZETIA) 10 MG tablet Take 1 tablet (10 mg total) by mouth daily. 90 tablet 3   glucose blood (ACCU-CHEK GUIDE) test strip Use to test blood sugars 1-2 times daily. Dx:e11.65 200 each 12   Homeopathic Products (THERAWORX RELIEF EX) Apply 1 application topically daily as needed (foot cramps).     ketoconazole (NIZORAL) 2 % cream SMARTSIG:1 Application Topical 1 to 2 Times Daily     L-Lysine 500 MG TABS Take 1 tablet by mouth daily.     Lancet Devices (ACCU-CHEK SOFTCLIX) lancets Use to test blood sugar once a day. 100 each 2   metroNIDAZOLE (METROCREAM) 0.75 % cream Apply topically 2 (two) times daily. Apply a thin layer twice a day to a clean dry face. 45 g 0   Multiple Vitamin (MULTIVITAMIN)  capsule Take 1 capsule by mouth daily.      olmesartan (BENICAR) 20 MG tablet TAKE 1 TABLET BY MOUTH EVERY DAY 90 tablet 2   oxybutynin (DITROPAN-XL) 10 MG 24 hr tablet Take 10 mg by mouth daily.     pantoprazole (PROTONIX) 20 MG tablet Take 1 tablet (20 mg total) by mouth daily. 90 tablet 2   Pitavastatin Calcium (LIVALO) 2 MG TABS Take 1 tablet (2 mg total) by mouth daily. 90 tablet 3   No current facility-administered medications on file prior to visit.    Past Medical History:  Diagnosis Date   Chicken pox    Depression    Diabetes mellitus without complication (HCC)    GERD (gastroesophageal reflux disease)    Hyperlipidemia    Hypertension    Stroke [facility])    UTI (urinary tract infection)    No Known Allergies  Social History   Socioeconomic History   Marital status: Widowed    Spouse name: Not on file   Number of children: Not on file   Years of education: Not on file   Highest education level: Not on file  Occupational History   Occupation: N/A  Tobacco Use   Smoking status: Never   Smokeless tobacco: Never  Vaping Use  Vaping Use: Never used  Substance and Sexual Activity   Alcohol use: Yes    Comment: occassional wine   Drug use: No   Sexual activity: Not Currently  Other Topics Concern   Not on file  Social History Narrative   Pt lives in single story home with her daughter   Has 2 children   Some college education   Retired Copywriter, advertising for [employer]   Social Determinants of Health   Financial Resource Strain: Low Risk  (02/16/2022)   Overall Financial Resource Strain (CARDIA)    Difficulty of Paying Living Expenses: Not hard at all  Food Insecurity: No Food Insecurity (07/15/2022)   Hunger Vital Sign    Worried About Running Out of Food in the Last Year: Never true    Ran Out of Food in the Last Year: Never true  Transportation Needs: No Transportation Needs (02/16/2022)   PRAPARE - Administrator, Civil Service  (Medical): No    Lack of Transportation (Non-Medical): No  Physical Activity: Inactive (02/16/2022)   Exercise Vital Sign    Days of Exercise per Week: 0 days    Minutes of Exercise per Session: 0 min  Stress: No Stress Concern Present (02/16/2022)   [employer] of Occupational Health - Occupational Stress Questionnaire    Feeling of Stress : Not at all  Social Connections: Moderately Integrated (02/16/2022)   Social Connection and Isolation Panel [NHANES]    Frequency of Communication with Friends and Family: More than three times a week    Frequency of Social Gatherings with Friends and Family: More than three times a week    Attends Religious Services: More than 4 times per year    Active Member of [employer] or Organizations: Yes    Attends Banker Meetings: More than 4 times per year    Marital Status: Widowed    There were no vitals filed for this visit. There is no height or weight on file to calculate BMI.  Physical Exam  ASSESSMENT AND PLAN: There are no diagnoses linked to this encounter.  No follow-ups on file.  Rye G. [place], MD  [facility]. [place] [facility].  Discharge Instructions   None

## 2022-09-21 ENCOUNTER — Encounter: Payer: Self-pay | Admitting: Family Medicine

## 2022-09-21 ENCOUNTER — Ambulatory Visit (INDEPENDENT_AMBULATORY_CARE_PROVIDER_SITE_OTHER): Payer: Medicare Other | Admitting: Family Medicine

## 2022-09-21 VITALS — BP 120/70 | HR 91 | Resp 16 | Ht 67.0 in | Wt 166.0 lb

## 2022-09-21 DIAGNOSIS — M791 Myalgia, unspecified site: Secondary | ICD-10-CM

## 2022-09-21 DIAGNOSIS — S0181XD Laceration without foreign body of other part of head, subsequent encounter: Secondary | ICD-10-CM | POA: Diagnosis not present

## 2022-09-21 NOTE — Patient Instructions (Signed)
A few things to remember from today's visit:  Laceration of forehead, subsequent encounter  Body aches  For body aches Tylenol 500 mg 3-4 times per day as needed. Keep wound clean with soap and water. No sun on wound of areas with bruising.  If you need refills for medications you take chronically, please call your pharmacy. Do not use My Chart to request refills or for acute issues that need immediate attention. If you send a my chart message, it may take a few days to be addressed, specially if I am not in the office.  Please be sure medication list is accurate. If a new problem present, please set up appointment sooner than planned today.

## 2022-10-07 ENCOUNTER — Other Ambulatory Visit: Payer: Self-pay | Admitting: Family Medicine

## 2022-10-26 ENCOUNTER — Encounter: Payer: Self-pay | Admitting: Podiatry

## 2022-10-26 ENCOUNTER — Ambulatory Visit (INDEPENDENT_AMBULATORY_CARE_PROVIDER_SITE_OTHER): Payer: Medicare Other | Admitting: Podiatry

## 2022-10-26 DIAGNOSIS — B351 Tinea unguium: Secondary | ICD-10-CM | POA: Diagnosis not present

## 2022-10-26 DIAGNOSIS — M79675 Pain in left toe(s): Secondary | ICD-10-CM | POA: Diagnosis not present

## 2022-10-26 DIAGNOSIS — E1142 Type 2 diabetes mellitus with diabetic polyneuropathy: Secondary | ICD-10-CM | POA: Diagnosis not present

## 2022-10-26 DIAGNOSIS — M79674 Pain in right toe(s): Secondary | ICD-10-CM | POA: Diagnosis not present

## 2022-10-26 DIAGNOSIS — L84 Corns and callosities: Secondary | ICD-10-CM | POA: Diagnosis not present

## 2022-10-31 NOTE — Progress Notes (Signed)
  Subjective:  Patient ID: ZYLA ZARZA, female    DOB: November 07, 1940,  MRN: [id number]  Michalia Troxler Burnsed-Siders presents to clinic today for at risk foot care with history of diabetic neuropathy  Chief Complaint  Patient presents with   Nail Problem    rfc   New problem(s): None.   PCP is [place], Daneya G, MD.  No Known Allergies  Review of Systems: Negative except as noted in the HPI.  Objective: No changes noted in today's physical examination. There were no vitals filed for this visit. Aryal Jacobowitz Burnsed-Siders is a pleasant 82 y.o. female WD, WN in NAD. AAO x 3.  Vascular Examination: Capillary refill time immediate b/l. Vascular status intact b/l with palpable pedal pulses. Pedal hair present b/l. No pain with calf compression b/l. Skin temperature gradient WNL b/l. No cyanosis or clubbing b/l. No ischemia or gangrene noted b/l.   Neurological Examination: Sensation grossly intact b/l with 10 gram monofilament. Vibratory sensation intact b/l. Pt has subjective symptoms of neuropathy.  Dermatological Examination: Pedal skin with normal turgor, texture and tone b/l.  No open wounds. No interdigital macerations.   Toenails 1-5 b/l thick, discolored, elongated with subungual debris and pain on dorsal palpation.   Hyperkeratotic lesion(s) R hallux.  No erythema, no edema, no drainage, no fluctuance.  Musculoskeletal Examination: Muscle strength 5/5 to all lower extremity muscle groups bilaterally. HAV with bunion deformity noted b/l LE.  Radiographs: None  Last A1c:      Latest Ref Rng & Units 12/28/2021    9:41 AM  Hemoglobin A1C  Hemoglobin-A1c 4.6 - 6.5 % 6.6    Assessment/Plan: 1. Pain due to onychomycosis of toenails of both feet   2. Callus   3. Diabetic peripheral neuropathy associated with type 2 diabetes mellitus (HCC)     -Patient was evaluated and treated. All patient's and/or POA's questions/concerns answered on today's visit. -Continue foot and shoe  inspections daily. Monitor blood glucose per PCP/Endocrinologist's recommendations. -Continue supportive shoe gear daily. -Mycotic toenails 1-5 bilaterally were debrided in length and girth with sterile nail nippers and dremel without incident. -Callus(es) R hallux pared utilizing sharp debridement with sterile blade without complication or incident. Total number debrided =1. -Patient/POA to call should there be question/concern in the interim.   Return in about 9 weeks (around 12/28/2022).  Freddie Breech, DPM

## 2022-11-02 DIAGNOSIS — R350 Frequency of micturition: Secondary | ICD-10-CM | POA: Diagnosis not present

## 2022-11-07 ENCOUNTER — Other Ambulatory Visit: Payer: Self-pay | Admitting: Family Medicine

## 2022-11-07 DIAGNOSIS — Z1231 Encounter for screening mammogram for malignant neoplasm of breast: Secondary | ICD-10-CM

## 2022-11-18 ENCOUNTER — Other Ambulatory Visit: Payer: Self-pay | Admitting: Family Medicine

## 2022-11-18 DIAGNOSIS — G629 Polyneuropathy, unspecified: Secondary | ICD-10-CM

## 2022-11-18 DIAGNOSIS — F331 Major depressive disorder, recurrent, moderate: Secondary | ICD-10-CM

## 2022-12-07 ENCOUNTER — Ambulatory Visit: Payer: Medicare Other

## 2022-12-09 ENCOUNTER — Ambulatory Visit
Admission: RE | Admit: 2022-12-09 | Discharge: 2022-12-09 | Disposition: A | Discharge status: Home / Self Care | Payer: Medicare Other | Source: Ambulatory Visit | Attending: Family Medicine | Admitting: Family Medicine

## 2022-12-09 ENCOUNTER — Other Ambulatory Visit: Payer: Self-pay | Admitting: Family Medicine

## 2022-12-09 DIAGNOSIS — Z1231 Encounter for screening mammogram for malignant neoplasm of breast: Secondary | ICD-10-CM

## 2022-12-09 DIAGNOSIS — I674 Hypertensive encephalopathy: Secondary | ICD-10-CM

## 2022-12-09 DIAGNOSIS — E1169 Type 2 diabetes mellitus with other specified complication: Secondary | ICD-10-CM

## 2022-12-13 ENCOUNTER — Other Ambulatory Visit: Payer: Self-pay | Admitting: Family Medicine

## 2022-12-13 DIAGNOSIS — R928 Other abnormal and inconclusive findings on diagnostic imaging of breast: Secondary | ICD-10-CM

## 2022-12-29 ENCOUNTER — Other Ambulatory Visit: Payer: Self-pay | Admitting: Family Medicine

## 2022-12-29 ENCOUNTER — Ambulatory Visit
Admission: RE | Admit: 2022-12-29 | Discharge: 2022-12-29 | Disposition: A | Discharge status: Home / Self Care | Payer: Medicare Other | Source: Ambulatory Visit | Attending: Family Medicine | Admitting: Family Medicine

## 2022-12-29 DIAGNOSIS — R928 Other abnormal and inconclusive findings on diagnostic imaging of breast: Secondary | ICD-10-CM | POA: Diagnosis not present

## 2022-12-29 DIAGNOSIS — N6310 Unspecified lump in the right breast, unspecified quadrant: Secondary | ICD-10-CM

## 2022-12-29 DIAGNOSIS — N6011 Diffuse cystic mastopathy of right breast: Secondary | ICD-10-CM | POA: Diagnosis not present

## 2022-12-30 ENCOUNTER — Encounter: Payer: Medicare Other | Admitting: Family Medicine

## 2022-12-30 NOTE — Progress Notes (Unsigned)
HPI: Alexa Brown is a 82 y.o. female with a PMHx significant for DM II, peripheral neuropathy, PE on chronic anticoagulation, CVA, unstable gait, hyperlipidemia, hypertension, and CKD  who is here today for her routine physical.  Last CPE: 02/16/2022*** Last seen on 09/21/2022  Exercise: Diet:  Sleep:  Alcohol Use:  Smoking: Vision:  Dental:  Immunization History  Administered Date(s) Administered   Hepatitis B, ADULT 01/22/2018   Influenza Whole 02/01/2016   Influenza, High Dose Seasonal PF 01/14/2016, 11/17/2018   Influenza-Unspecified 01/12/2017, 12/26/2017, 12/13/2019   PFIZER(Purple Top)SARS-COV-2 Vaccination 05/03/2019, 05/28/2019, 01/11/2020   Pneumococcal Conjugate-13 12/27/2013   Pneumococcal Polysaccharide-23 12/15/2017   Tdap 12/24/2015   Zoster Recombinant(Shingrix) 08/16/2016, 01/12/2017   Health Maintenance  Topic Date Due   OPHTHALMOLOGY EXAM  09/21/2021   HEMOGLOBIN A1C  06/29/2022   Diabetic kidney evaluation - Urine ACR  06/30/2022   INFLUENZA VACCINE  10/27/2022   COVID-19 Vaccine (4 - 2023-24 season) 11/27/2022   Medicare Annual Wellness (AWV)  02/17/2023   FOOT EXAM  01/04/2023 (Originally 08/18/2022)   Diabetic kidney evaluation - eGFR measurement  09/12/2023   DTaP/Tdap/Td (2 - Td or Tdap) 12/23/2025   Pneumonia Vaccine 51+ Years old  Completed   DEXA SCAN  Completed   Zoster Vaccines- Shingrix  Completed   HPV VACCINES  Aged Out   Hepatitis C Screening  Discontinued   Chronic medical problems: *** DM II: *** Lab Results  Component Value Date   HGBA1C 6.6 (H) 12/28/2021   HTN: *** Lab Results  Component Value Date   NA 138 09/12/2022   CL 104 09/12/2022   K 4.6 09/12/2022   CO2 23 09/12/2022   BUN 17 09/12/2022   CREATININE 1.13 (H) 09/12/2022   GFRNONAA 49 (L) 09/12/2022   CALCIUM 9.8 09/12/2022   PHOS 2.9 07/27/2020   ALBUMIN 3.9 09/05/2022   GLUCOSE 180 (H) 09/12/2022   HLD: *** Lab Results  Component Value  Date   CHOL 205 (H) 12/28/2021   HDL 73.40 12/28/2021   LDLCALC 94 12/28/2021   TRIG 190.0 (H) 12/28/2021   CHOLHDL 3 12/28/2021    Concerns today: ***  Review of Systems  Current Outpatient Medications on File Prior to Visit  Medication Sig Dispense Refill   Accu-Chek Softclix Lancets lancets Use to check blood sugars 1-2 times daily. Dx:E11.65 200 each 12   acetaminophen (TYLENOL) 325 MG tablet Take 650 mg by mouth every 6 (six) hours as needed for mild pain, fever or headache.     allopurinol (ZYLOPRIM) 100 MG tablet TAKE 1 TABLET BY MOUTH TWICE A DAY 180 tablet 1   Alpha-Lipoic Acid 600 MG TABS Take 600 mg by mouth daily.     b complex vitamins capsule Take 1 capsule by mouth daily.     Blood Glucose Monitoring Suppl (ACCU-CHEK GUIDE) w/Device KIT Use to check blood sugars 1-2 times daily. Dx:E11.65 1 kit 0   buPROPion ER (WELLBUTRIN SR) 100 MG 12 hr tablet TAKE 1 TABLET BY MOUTH TWICE A DAY 180 tablet 1   calcium carbonate (CALCIUM 600) 600 MG TABS tablet Take 600 mg by mouth daily.     DULoxetine (CYMBALTA) 60 MG capsule TAKE 1 CAPSULE BY MOUTH EVERY DAY 90 capsule 1   ELIQUIS 5 MG TABS tablet TAKE 1 TABLET BY MOUTH TWICE A DAY 180 tablet 3   ezetimibe (ZETIA) 10 MG tablet TAKE 1 TABLET BY MOUTH EVERY DAY 90 tablet 3   glucose blood (ACCU-CHEK GUIDE) test  strip Use to test blood sugars 1-2 times daily. Dx:e11.65 200 each 12   Homeopathic Products (THERAWORX RELIEF EX) Apply 1 application topically daily as needed (foot cramps).     ketoconazole (NIZORAL) 2 % cream SMARTSIG:1 Application Topical 1 to 2 Times Daily     L-Lysine 500 MG TABS Take 1 tablet by mouth daily.     Lancet Devices (ACCU-CHEK SOFTCLIX) lancets Use to test blood sugar once a day. 100 each 2   LIVALO 2 MG TABS TAKE 1 TABLET BY MOUTH EVERY DAY 90 tablet 3   metroNIDAZOLE (METROCREAM) 0.75 % cream Apply topically 2 (two) times daily. Apply a thin layer twice a day to a clean dry face. 45 g 0   Multiple Vitamin  (MULTIVITAMIN) capsule Take 1 capsule by mouth daily.      olmesartan (BENICAR) 20 MG tablet TAKE 1 TABLET BY MOUTH EVERY DAY 90 tablet 2   oxybutynin (DITROPAN-XL) 10 MG 24 hr tablet Take 10 mg by mouth daily.     pantoprazole (PROTONIX) 20 MG tablet Take 1 tablet (20 mg total) by mouth daily. 90 tablet 2   No current facility-administered medications on file prior to visit.    Past Medical History:  Diagnosis Date   Chicken pox    Depression    Diabetes mellitus without complication (HCC)    GERD (gastroesophageal reflux disease)    Hyperlipidemia    Hypertension    Stroke (HCC)    UTI (urinary tract infection)     Past Surgical History:  Procedure Laterality Date   ABDOMINAL HYSTERECTOMY     APPENDECTOMY     BREAST BIOPSY Left 2014   BREAST SURGERY     biopsy    No Known Allergies  Family History  Problem Relation Age of Onset   Hyperlipidemia Mother    Heart disease Mother    Stroke Mother    Hypertension Mother    Hyperlipidemia Father    Heart disease Father    Stroke Father    Hypertension Father    Asthma Father    Parkinson's disease Sister     Social History   Socioeconomic History   Marital status: Widowed    Spouse name: Not on file   Number of children: Not on file   Years of education: Not on file   Highest education level: Not on file  Occupational History   Occupation: N/A  Tobacco Use   Smoking status: Never   Smokeless tobacco: Never  Vaping Use   Vaping status: Never Used  Substance and Sexual Activity   Alcohol use: Yes    Comment: occassional wine   Drug use: No   Sexual activity: Not Currently  Other Topics Concern   Not on file  Social History Narrative   Pt lives in single story home with her daughter   Has 2 children   Some college education   Retired Copywriter, advertising for [employer]   Social Determinants of Health   Financial Resource Strain: Low Risk  (02/16/2022)   Overall Financial Resource Strain  (CARDIA)    Difficulty of Paying Living Expenses: Not hard at all  Food Insecurity: No Food Insecurity (07/15/2022)   Hunger Vital Sign    Worried About Running Out of Food in the Last Year: Never true    Ran Out of Food in the Last Year: Never true  Transportation Needs: No Transportation Needs (02/16/2022)   PRAPARE - Administrator, Civil Service (Medical): No  Lack of Transportation (Non-Medical): No  Physical Activity: Inactive (02/16/2022)   Exercise Vital Sign    Days of Exercise per Week: 0 days    Minutes of Exercise per Session: 0 min  Stress: No Stress Concern Present (02/16/2022)   [employer] of Occupational Health - Occupational Stress Questionnaire    Feeling of Stress : Not at all  Social Connections: Moderately Integrated (02/16/2022)   Social Connection and Isolation Panel [NHANES]    Frequency of Communication with Friends and Family: More than three times a week    Frequency of Social Gatherings with Friends and Family: More than three times a week    Attends Religious Services: More than 4 times per year    Active Member of [employer] or Organizations: Yes    Attends Banker Meetings: More than 4 times per year    Marital Status: Widowed    There were no vitals filed for this visit. There is no height or weight on file to calculate BMI.  Wt Readings from Last 3 Encounters:  09/21/22 166 lb (75.3 kg)  09/16/22 166 lb (75.3 kg)  04/13/22 162 lb (73.5 kg)    Physical Exam  ASSESSMENT AND PLAN:  Ms. Alexa Brown was here today for her annual physical examination.  No orders of the defined types were placed in this encounter.   @ASSESSPLAN @  There are no diagnoses linked to this encounter.  No follow-ups on file.  I, Suanne Marker, acting as a scribe for Paitlyn Mcclatchey [place], MD., have documented all relevant documentation on the behalf of Genaro Bekker [place], MD, as directed by  Kenslei Hearty [place], MD while in the presence of Ellicia Alix  [place], MD.   I, Suanne Marker, have reviewed all documentation for this visit. The documentation on 12/30/22 for the exam, diagnosis, procedures, and orders are all accurate and complete.  Sami Roes G. [place], MD  [facility]. [place] office.

## 2022-12-30 NOTE — Progress Notes (Incomplete)
HPI: [name] is a 82 y.o. female with a PMHx significant for DM II, peripheral neuropathy, PE on chronic anticoagulation, CVA, unstable gait, HLD, HTN, and CKD, who is here today for her routine physical.  Last CPE: *** 01/30/2023 Last seen on 09/21/2022  Exercise: Diet:  Sleep:  Alcohol Use:  Smoking: Vision:  Dental:  Immunization History  Administered Date(s) Administered   Hepatitis B, ADULT 01/22/2018   Influenza Whole 02/01/2016   Influenza, High Dose Seasonal PF 01/14/2016, 11/17/2018   Influenza-Unspecified 01/12/2017, 12/26/2017, 12/13/2019   PFIZER(Purple Top)SARS-COV-2 Vaccination 05/03/2019, 05/28/2019, 01/11/2020   Pneumococcal Conjugate-13 12/27/2013   Pneumococcal Polysaccharide-23 12/15/2017   Tdap 12/24/2015   Zoster Recombinant(Shingrix) 08/16/2016, 01/12/2017   Health Maintenance  Topic Date Due   OPHTHALMOLOGY EXAM  09/21/2021   HEMOGLOBIN A1C  06/29/2022   Diabetic kidney evaluation - Urine ACR  06/30/2022   INFLUENZA VACCINE  10/27/2022   COVID-19 Vaccine (4 - 2023-24 season) 11/27/2022   Medicare Annual Wellness (AWV)  02/17/2023   FOOT EXAM  01/04/2023 (Originally 08/18/2022)   Diabetic kidney evaluation - eGFR measurement  09/12/2023   DTaP/Tdap/Td (2 - Td or Tdap) 12/23/2025   Pneumonia Vaccine 53+ Years old  Completed   DEXA SCAN  Completed   Zoster Vaccines- Shingrix  Completed   HPV VACCINES  Aged Out   Hepatitis C Screening  Discontinued   Chronic medical problems: ***  Concerns today: ***  Review of Systems  Current Outpatient Medications on File Prior to Visit  Medication Sig Dispense Refill   Accu-Chek Softclix Lancets lancets Use to check blood sugars 1-2 times daily. Dx:E11.65 200 each 12   acetaminophen (TYLENOL) 325 MG tablet Take 650 mg by mouth every 6 (six) hours as needed for mild pain, fever or headache.     allopurinol (ZYLOPRIM) 100 MG tablet TAKE 1 TABLET BY MOUTH TWICE A DAY 180 tablet 1    Alpha-Lipoic Acid 600 MG TABS Take 600 mg by mouth daily.     b complex vitamins capsule Take 1 capsule by mouth daily.     Blood Glucose Monitoring Suppl (ACCU-CHEK GUIDE) w/Device KIT Use to check blood sugars 1-2 times daily. Dx:E11.65 1 kit 0   buPROPion ER (WELLBUTRIN SR) 100 MG 12 hr tablet TAKE 1 TABLET BY MOUTH TWICE A DAY 180 tablet 1   calcium carbonate (CALCIUM 600) 600 MG TABS tablet Take 600 mg by mouth daily.     DULoxetine (CYMBALTA) 60 MG capsule TAKE 1 CAPSULE BY MOUTH EVERY DAY 90 capsule 1   ELIQUIS 5 MG TABS tablet TAKE 1 TABLET BY MOUTH TWICE A DAY 180 tablet 3   ezetimibe (ZETIA) 10 MG tablet TAKE 1 TABLET BY MOUTH EVERY DAY 90 tablet 3   glucose blood (ACCU-CHEK GUIDE) test strip Use to test blood sugars 1-2 times daily. Dx:e11.65 200 each 12   Homeopathic Products (THERAWORX RELIEF EX) Apply 1 application topically daily as needed (foot cramps).     ketoconazole (NIZORAL) 2 % cream SMARTSIG:1 Application Topical 1 to 2 Times Daily     L-Lysine 500 MG TABS Take 1 tablet by mouth daily.     Lancet Devices (ACCU-CHEK SOFTCLIX) lancets Use to test blood sugar once a day. 100 each 2   LIVALO 2 MG TABS TAKE 1 TABLET BY MOUTH EVERY DAY 90 tablet 3   metroNIDAZOLE (METROCREAM) 0.75 % cream Apply topically 2 (two) times daily. Apply a thin layer twice a day to a clean dry face. 45  g 0   Multiple Vitamin (MULTIVITAMIN) capsule Take 1 capsule by mouth daily.      olmesartan (BENICAR) 20 MG tablet TAKE 1 TABLET BY MOUTH EVERY DAY 90 tablet 2   oxybutynin (DITROPAN-XL) 10 MG 24 hr tablet Take 10 mg by mouth daily.     pantoprazole (PROTONIX) 20 MG tablet Take 1 tablet (20 mg total) by mouth daily. 90 tablet 2   No current facility-administered medications on file prior to visit.    Past Medical History:  Diagnosis Date   Chicken pox    Depression    Diabetes mellitus without complication (HCC)    GERD (gastroesophageal reflux disease)    Hyperlipidemia    Hypertension     Stroke (HCC)    UTI (urinary tract infection)     Past Surgical History:  Procedure Laterality Date   ABDOMINAL HYSTERECTOMY     APPENDECTOMY     BREAST BIOPSY Left 2014   BREAST SURGERY     biopsy    No Known Allergies  Family History  Problem Relation Age of Onset   Hyperlipidemia Mother    Heart disease Mother    Stroke Mother    Hypertension Mother    Hyperlipidemia Father    Heart disease Father    Stroke Father    Hypertension Father    Asthma Father    Parkinson's disease Sister     Social History   Socioeconomic History   Marital status: Widowed    Spouse name: Not on file   Number of children: Not on file   Years of education: Not on file   Highest education level: Not on file  Occupational History   Occupation: N/A  Tobacco Use   Smoking status: Never   Smokeless tobacco: Never  Vaping Use   Vaping status: Never Used  Substance and Sexual Activity   Alcohol use: Yes    Comment: occassional wine   Drug use: No   Sexual activity: Not Currently  Other Topics Concern   Not on file  Social History Narrative   Pt lives in single story home with her daughter   Has 2 children   Some college education   Retired Copywriter, advertising for [employer]   Social Determinants of Health   Financial Resource Strain: Low Risk  (02/16/2022)   Overall Financial Resource Strain (CARDIA)    Difficulty of Paying Living Expenses: Not hard at all  Food Insecurity: No Food Insecurity (07/15/2022)   Hunger Vital Sign    Worried About Running Out of Food in the Last Year: Never true    Ran Out of Food in the Last Year: Never true  Transportation Needs: No Transportation Needs (02/16/2022)   PRAPARE - Administrator, Civil Service (Medical): No    Lack of Transportation (Non-Medical): No  Physical Activity: Inactive (02/16/2022)   Exercise Vital Sign    Days of Exercise per Week: 0 days    Minutes of Exercise per Session: 0 min  Stress: No Stress  Concern Present (02/16/2022)   [employer] of Occupational Health - Occupational Stress Questionnaire    Feeling of Stress : Not at all  Social Connections: Moderately Integrated (02/16/2022)   Social Connection and Isolation Panel [NHANES]    Frequency of Communication with Friends and Family: More than three times a week    Frequency of Social Gatherings with Friends and Family: More than three times a week    Attends Religious Services: More than 4  times per year    Active Member of Clubs or Organizations: Yes    Attends Banker Meetings: More than 4 times per year    Marital Status: Widowed    There were no vitals filed for this visit. There is no height or weight on file to calculate BMI.  Wt Readings from Last 3 Encounters:  09/21/22 166 lb (75.3 kg)  09/16/22 166 lb (75.3 kg)  04/13/22 162 lb (73.5 kg)    Physical Exam  ASSESSMENT AND PLAN:  Ms. Alexa Brown was here today for her annual physical examination.  No orders of the defined types were placed in this encounter.   @ASSESSPLAN @  There are no diagnoses linked to this encounter.  No follow-ups on file.  I, Suanne Marker, acting as a scribe for Lavera [place], MD., have documented all relevant documentation on the behalf of Indea [place], MD, as directed by  Alyssah [place], MD while in the presence of Kennidy [place], MD.   I, Suanne Marker, have reviewed all documentation for this visit. The documentation on 12/30/22 for the exam, diagnosis, procedures, and orders are all accurate and complete.  Ayelet G. [place], MD  [facility]. [place] office.

## 2023-01-02 ENCOUNTER — Ambulatory Visit (INDEPENDENT_AMBULATORY_CARE_PROVIDER_SITE_OTHER): Payer: Medicare Other | Admitting: Family Medicine

## 2023-01-02 ENCOUNTER — Encounter: Payer: Self-pay | Admitting: Family Medicine

## 2023-01-02 VITALS — BP 104/60 | HR 96 | Temp 97.6°F | Resp 16 | Ht 67.0 in | Wt 162.0 lb

## 2023-01-02 DIAGNOSIS — I674 Hypertensive encephalopathy: Secondary | ICD-10-CM

## 2023-01-02 DIAGNOSIS — Z23 Encounter for immunization: Secondary | ICD-10-CM | POA: Diagnosis not present

## 2023-01-02 DIAGNOSIS — Z0000 Encounter for general adult medical examination without abnormal findings: Secondary | ICD-10-CM | POA: Diagnosis not present

## 2023-01-02 DIAGNOSIS — E785 Hyperlipidemia, unspecified: Secondary | ICD-10-CM

## 2023-01-02 DIAGNOSIS — E1169 Type 2 diabetes mellitus with other specified complication: Secondary | ICD-10-CM

## 2023-01-02 DIAGNOSIS — F3341 Major depressive disorder, recurrent, in partial remission: Secondary | ICD-10-CM

## 2023-01-02 DIAGNOSIS — E1149 Type 2 diabetes mellitus with other diabetic neurological complication: Secondary | ICD-10-CM

## 2023-01-02 DIAGNOSIS — N1831 Chronic kidney disease, stage 3a: Secondary | ICD-10-CM | POA: Diagnosis not present

## 2023-01-02 LAB — BASIC METABOLIC PANEL
BUN: 18 mg/dL (ref 6–23)
CO2: 24 meq/L (ref 19–32)
Calcium: 9.8 mg/dL (ref 8.4–10.5)
Chloride: 106 meq/L (ref 96–112)
Creatinine, Ser: 1.1 mg/dL (ref 0.40–1.20)
GFR: 47.45 mL/min — ABNORMAL LOW (ref >60.00)
Glucose, Bld: 125 mg/dL — ABNORMAL HIGH (ref 70–99)
Potassium: 3.6 meq/L (ref 3.5–5.1)
Sodium: 140 meq/L (ref 135–145)

## 2023-01-02 LAB — LIPID PANEL
Cholesterol: 152 mg/dL (ref 0–200)
HDL: 65.2 mg/dL (ref >39.00)
LDL Cholesterol: 63 mg/dL (ref 0–99)
NonHDL: 87.08
Total CHOL/HDL Ratio: 2
Triglycerides: 119 mg/dL (ref 0.0–149.0)
VLDL: 23.8 mg/dL (ref 0.0–40.0)

## 2023-01-02 LAB — HEMOGLOBIN A1C: Hgb A1c MFr Bld: 6.3 % (ref 4.6–6.5)

## 2023-01-02 NOTE — Assessment & Plan Note (Signed)
She has not tolerated different statins in the past. Continue Zetia 10 mg daily and Livalo 2 mg daily as well as low-fat diet. Further recommendation will be given according to lipid panel results.

## 2023-01-02 NOTE — Assessment & Plan Note (Signed)
We discussed the importance of regular physical activity and healthy diet for prevention of chronic illness and/or complications. Preventive guidelines reviewed. Fall precautions discussed. Vaccination after today. Next CPE in a year.

## 2023-01-02 NOTE — Assessment & Plan Note (Signed)
Problem has been adequately controlled, last hemoglobin A1c 6.6. Continue non pharmacologic treatment. Appropriate foot care to continue and periodic eye exam. Further recommendation will be given according to hemoglobin A1c result.

## 2023-01-02 NOTE — Assessment & Plan Note (Signed)
Overall stable. Currently on Duloxetine 60 mg daily and Wellbutrin SR 100 mg bid.

## 2023-01-02 NOTE — Assessment & Plan Note (Signed)
Problem has been stable. Last creatinine 1.13 and EGFR 49 in 08/2022. Continue adequate hydration, BP control, low-salt diet, and avoidance of NSAIDs. Continue olmesartan 20 mg daily.

## 2023-01-02 NOTE — Assessment & Plan Note (Addendum)
BP today on lower normal range, no changes for now. Continue monitoring BP at home regularly. Continue Benicar 20 mg daily and low salt diet. Eye exam is current.

## 2023-01-02 NOTE — Patient Instructions (Addendum)
A few things to remember from today's visit:  Routine general medical examination at a health care facility  DM (diabetes mellitus), type 2 with neurological complications (HCC) - Plan: Microalbumin / creatinine urine ratio, Basic metabolic panel, Hemoglobin A1c  Stage 3a chronic kidney disease (HCC) - Plan: Microalbumin / creatinine urine ratio, Basic metabolic panel  Systolic hypertension with cerebrovascular disease - Plan: Basic metabolic panel  Hyperlipidemia associated with type 2 diabetes mellitus (HCC) - Plan: Lipid panel  PAD (peripheral artery disease) (HCC), Chronic  If you need refills for medications you take chronically, please call your pharmacy. Do not use My Chart to request refills or for acute issues that need immediate attention. If you send a my chart message, it may take a few days to be addressed, specially if I am not in the office.  Please be sure medication list is accurate. If a new problem present, please set up appointment sooner than planned today.

## 2023-01-03 LAB — MICROALBUMIN / CREATININE URINE RATIO
Creatinine,U: 138.5 mg/dL
Microalb Creat Ratio: 4.1 mg/g (ref 0.0–30.0)
Microalb, Ur: 5.7 mg/dL — ABNORMAL HIGH (ref 0.0–1.9)

## 2023-01-04 ENCOUNTER — Encounter: Payer: Self-pay | Admitting: Podiatry

## 2023-01-04 ENCOUNTER — Ambulatory Visit (INDEPENDENT_AMBULATORY_CARE_PROVIDER_SITE_OTHER): Payer: Medicare Other | Admitting: Podiatry

## 2023-01-04 DIAGNOSIS — L84 Corns and callosities: Secondary | ICD-10-CM

## 2023-01-04 DIAGNOSIS — M79675 Pain in left toe(s): Secondary | ICD-10-CM | POA: Diagnosis not present

## 2023-01-04 DIAGNOSIS — B351 Tinea unguium: Secondary | ICD-10-CM

## 2023-01-04 DIAGNOSIS — M79674 Pain in right toe(s): Secondary | ICD-10-CM

## 2023-01-04 DIAGNOSIS — E1142 Type 2 diabetes mellitus with diabetic polyneuropathy: Secondary | ICD-10-CM

## 2023-01-08 NOTE — Progress Notes (Signed)
Subjective:  Patient ID: Alexa Brown, female    DOB: Apr 24, 1940,  MRN: [id number]  83 y.o. female presents at risk foot care with history of diabetic neuropathy and callus(es) right lower extremity and painful thick toenails that are difficult to trim. Painful toenails interfere with ambulation. Aggravating factors include wearing enclosed shoe gear. Pain is relieved with periodic professional debridement. Painful calluses are aggravated when weightbearing with and without shoegear. Pain is relieved with periodic professional debridement.  Chief Complaint  Patient presents with   RFC    New problem(s): None   PCP is [place], Robbin G, MD , and last visit was January 02, 2023.  No Known Allergies  Review of Systems: Negative except as noted in the HPI.   Objective:  [name] is a pleasant 82 y.o. female WD, WN in NAD.Marland Kitchen AAO x 3.  Vascular Examination: Vascular status intact b/l with palpable pedal pulses. CFT immediate b/l. Pedal hair present. No edema. No pain with calf compression b/l. Skin temperature gradient WNL b/l. No varicosities noted. No cyanosis or clubbing noted.  Neurological Examination: Pt has subjective symptoms of neuropathy. Sensation grossly intact b/l with 10 gram monofilament. Vibratory sensation intact b/l.  Dermatological Examination: Pedal skin with normal turgor, texture and tone b/l. No open wounds nor interdigital macerations noted. Toenails 1-5 b/l thick, discolored, elongated with subungual debris and pain on dorsal palpation. Hyperkeratotic lesion(s) right great toe.  No erythema, no edema, no drainage, no fluctuance.  Musculoskeletal Examination: Muscle strength 5/5 to b/l LE.  No pain, crepitus noted b/l. HAV with bunion b/l.  Radiographs: None  Last A1c:      Latest Ref Rng & Units 01/02/2023    9:04 AM  Hemoglobin A1C  Hemoglobin-A1c 4.6 - 6.5 % 6.3      Assessment:   1. Pain due to onychomycosis of toenails of both  feet   2. Callus   3. Diabetic peripheral neuropathy associated with type 2 diabetes mellitus (HCC)    Plan:  -Consent given for treatment as described below: -Examined patient. -Patient to continue soft, supportive shoe gear daily. -Toenails 1-5 b/l were debrided in length and girth with sterile nail nippers and dremel without iatrogenic bleeding.  -Callus(es) right great toe pared utilizing sterile scalpel blade without complication or incident. Total number debrided =1. -Patient/POA to call should there be question/concern in the interim.  Return in about 3 months (around 04/06/2023).  Freddie Breech, DPM

## 2023-01-27 DIAGNOSIS — R350 Frequency of micturition: Secondary | ICD-10-CM | POA: Diagnosis not present

## 2023-02-03 DIAGNOSIS — R350 Frequency of micturition: Secondary | ICD-10-CM | POA: Diagnosis not present

## 2023-02-10 DIAGNOSIS — R350 Frequency of micturition: Secondary | ICD-10-CM | POA: Diagnosis not present

## 2023-02-15 ENCOUNTER — Other Ambulatory Visit: Payer: Self-pay | Admitting: Family Medicine

## 2023-02-17 DIAGNOSIS — R350 Frequency of micturition: Secondary | ICD-10-CM | POA: Diagnosis not present

## 2023-02-21 ENCOUNTER — Encounter: Payer: Medicare Other | Admitting: Family Medicine

## 2023-02-21 DIAGNOSIS — R350 Frequency of micturition: Secondary | ICD-10-CM | POA: Diagnosis not present

## 2023-03-03 DIAGNOSIS — R350 Frequency of micturition: Secondary | ICD-10-CM | POA: Diagnosis not present

## 2023-03-10 DIAGNOSIS — R350 Frequency of micturition: Secondary | ICD-10-CM | POA: Diagnosis not present

## 2023-03-15 ENCOUNTER — Ambulatory Visit: Payer: Medicare Other | Admitting: Podiatry

## 2023-03-15 ENCOUNTER — Encounter: Payer: Self-pay | Admitting: Podiatry

## 2023-03-15 VITALS — Ht 67.0 in | Wt 162.0 lb

## 2023-03-15 DIAGNOSIS — B351 Tinea unguium: Secondary | ICD-10-CM

## 2023-03-15 DIAGNOSIS — M79675 Pain in left toe(s): Secondary | ICD-10-CM

## 2023-03-15 DIAGNOSIS — E1142 Type 2 diabetes mellitus with diabetic polyneuropathy: Secondary | ICD-10-CM | POA: Diagnosis not present

## 2023-03-15 DIAGNOSIS — M79674 Pain in right toe(s): Secondary | ICD-10-CM | POA: Diagnosis not present

## 2023-03-17 DIAGNOSIS — R350 Frequency of micturition: Secondary | ICD-10-CM | POA: Diagnosis not present

## 2023-03-23 ENCOUNTER — Encounter: Payer: Self-pay | Admitting: Podiatry

## 2023-03-23 NOTE — Progress Notes (Signed)
  Subjective:  Patient ID: Alexa Brown, female    DOB: 1940/09/07,  MRN: [id number]  82 y.o. female presents to clinic with  at risk foot care with history of diabetic neuropathy and painful elongated mycotic toenails 1-5 bilaterally which are tender when wearing enclosed shoe gear. Pain is relieved with periodic professional debridement.  Chief Complaint  Patient presents with   Nail Problem    Pt is here for RFC not a diabetic PCP is Dr [place] and LOV was 2 months ago.     New problem(s): None   PCP is [place], Esperansa G, MD.  No Known Allergies  Review of Systems: Negative except as noted in the HPI.   Objective:  Alexa Brown is a pleasant 82 y.o. female WD, WN in NAD.Marland Kitchen AAO x 3.  Vascular Examination: Vascular status intact b/l with palpable pedal pulses. CFT immediate b/l. No edema. No pain with calf compression b/l. Skin temperature gradient WNL b/l.   Neurological Examination: Sensation grossly intact b/l with 10 gram monofilament. Vibratory sensation intact b/l. Pt has subjective symptoms of neuropathy.  Dermatological Examination: Pedal skin with normal turgor, texture and tone b/l. Toenails 1-5 b/l thick, discolored, elongated with subungual debris and pain on dorsal palpation. No corns, calluses nor porokeratotic lesions noted.  Musculoskeletal Examination: Muscle strength 5/5 to b/l LE. HAV with bunion deformity noted b/l LE.  Radiographs: None  Last A1c:      Latest Ref Rng & Units 01/02/2023    9:04 AM  Hemoglobin A1C  Hemoglobin-A1c 4.6 - 6.5 % 6.3      Assessment:   1. Pain due to onychomycosis of toenails of both feet   2. Diabetic peripheral neuropathy associated with type 2 diabetes mellitus (HCC)     Plan:  Patient was evaluated and treated. All patient's and/or POA's questions/concerns addressed on today's visit. Toenails 1-5 debrided in length and girth without incident. Continue soft, supportive shoe gear daily. Report any pedal  injuries to medical professional. Call office if there are any questions/concerns. -Patient/POA to call should there be question/concern in the interim.  Return in about 3 months (around 06/13/2023).  Freddie Breech, DPM      [facility] LOCATION: [address], [place] [address]                   Office [phone]   [facility] LOCATION: [address] [place], [place] [address] Office [phone]

## 2023-03-24 DIAGNOSIS — R350 Frequency of micturition: Secondary | ICD-10-CM | POA: Diagnosis not present

## 2023-03-27 ENCOUNTER — Ambulatory Visit: Payer: Medicare Other | Admitting: Podiatry

## 2023-03-31 DIAGNOSIS — R350 Frequency of micturition: Secondary | ICD-10-CM | POA: Diagnosis not present

## 2023-04-06 ENCOUNTER — Encounter: Payer: Medicare Other | Admitting: Family Medicine

## 2023-04-07 DIAGNOSIS — R350 Frequency of micturition: Secondary | ICD-10-CM | POA: Diagnosis not present

## 2023-05-09 DIAGNOSIS — Z789 Other specified health status: Secondary | ICD-10-CM | POA: Diagnosis not present

## 2023-05-09 DIAGNOSIS — L814 Other melanin hyperpigmentation: Secondary | ICD-10-CM | POA: Diagnosis not present

## 2023-05-09 DIAGNOSIS — L820 Inflamed seborrheic keratosis: Secondary | ICD-10-CM | POA: Diagnosis not present

## 2023-05-09 DIAGNOSIS — D225 Melanocytic nevi of trunk: Secondary | ICD-10-CM | POA: Diagnosis not present

## 2023-05-09 DIAGNOSIS — Z86007 Personal history of in-situ neoplasm of skin: Secondary | ICD-10-CM | POA: Diagnosis not present

## 2023-05-09 DIAGNOSIS — R208 Other disturbances of skin sensation: Secondary | ICD-10-CM | POA: Diagnosis not present

## 2023-05-09 DIAGNOSIS — L821 Other seborrheic keratosis: Secondary | ICD-10-CM | POA: Diagnosis not present

## 2023-05-09 DIAGNOSIS — Z08 Encounter for follow-up examination after completed treatment for malignant neoplasm: Secondary | ICD-10-CM | POA: Diagnosis not present

## 2023-05-09 DIAGNOSIS — L2989 Other pruritus: Secondary | ICD-10-CM | POA: Diagnosis not present

## 2023-05-09 DIAGNOSIS — L538 Other specified erythematous conditions: Secondary | ICD-10-CM | POA: Diagnosis not present

## 2023-05-11 ENCOUNTER — Encounter: Payer: Self-pay | Admitting: Family Medicine

## 2023-05-11 ENCOUNTER — Encounter: Payer: Medicare Other | Admitting: Family Medicine

## 2023-05-11 ENCOUNTER — Ambulatory Visit: Payer: Medicare Other | Admitting: Family Medicine

## 2023-05-11 DIAGNOSIS — Z0000 Encounter for general adult medical examination without abnormal findings: Secondary | ICD-10-CM | POA: Diagnosis not present

## 2023-05-11 MED ORDER — TRIAMCINOLONE ACETONIDE 0.5 % EX CREA: 1.0000 | TOPICAL_CREAM | Freq: Three times a day (TID) | CUTANEOUS | 0 refills | Status: AC

## 2023-05-11 NOTE — Progress Notes (Signed)
Patient unable to obtain vital signs due to telehealth visit

## 2023-05-11 NOTE — Patient Instructions (Signed)
I really enjoyed getting to talk with you today! I am available on Tuesdays and Thursdays for virtual visits if you have any questions or concerns, or if I can be of any further assistance.   CHECKLIST FROM ANNUAL WELLNESS VISIT:  -Follow up (please call to schedule if not scheduled after visit):   -yearly for annual wellness visit with primary care office  Here is a list of your preventive care/health maintenance measures and the plan for each if any are due:  PLAN For any measures below that may be due:  -please schedule eye exam -if you wish to get the covid vaccine you can do so at the pharmcy  Health Maintenance  Topic Date Due   OPHTHALMOLOGY EXAM  09/21/2021   COVID-19 Vaccine (4 - 2024-25 season) 11/27/2022   HEMOGLOBIN A1C  07/03/2023   Diabetic kidney evaluation - eGFR measurement  01/02/2024   Diabetic kidney evaluation - Urine ACR  01/02/2024   FOOT EXAM  01/02/2024   Medicare Annual Wellness (AWV)  05/10/2024   DTaP/Tdap/Td (2 - Td or Tdap) 12/23/2025   Pneumonia Vaccine 36+ Years old  Completed   INFLUENZA VACCINE  Completed   DEXA SCAN  Completed   Zoster Vaccines- Shingrix  Completed   HPV VACCINES  Aged Out   Hepatitis C Screening  Discontinued    -See a dentist at least yearly  -Get your eyes checked and then per your eye specialist's recommendations  -Other issues addressed today:   -I have included below further information regarding a healthy whole foods based diet, physical activity guidelines for adults, stress management and opportunities for social connections. I hope you find this information useful.   -----------------------------------------------------------------------------------------------------------------------------------------------------------------------------------------------------------------------------------------------------------    NUTRITION: -eat real food: lots of colorful vegetables (half the plate) and fruits -5-7  servings of vegetables and fruits per day (fresh or steamed is best), exp. 2 servings of vegetables with lunch and dinner and 2 servings of fruit per day. Berries and greens such as kale and collards are great choices.  -consume on a regular basis:  fresh fruits, fresh veggies, fish, nuts, seeds, healthy oils (such as olive oil, avocado oil), whole grains (make sure first ingredient on label contains the word "whole"), -can eat small amounts of dairy and lean meat (no larger than the palm of your hand), but avoid processed meats such as ham, bacon, lunch meat, etc. -drink water -try to avoid fast food and pre-packaged foods, processed meat, ultra processed foods (donuts, candy, etc.) -most experts advise limiting sodium to < 2300mg  per day, should limit further is any chronic conditions such as high blood pressure, heart disease, diabetes, etc. The American Heart Association advised that < 1500mg  is is ideal -try to avoid foods that contain any ingredients with names you do not recognize  -try to avoid foods with added sugar or sweeteners/sweets  -try to avoid sweet drinks -try to avoid white rice, white bread, pasta (unless whole grain)  EXERCISE GUIDELINES FOR ADULTS: -if you wish to increase your physical activity, do so gradually and with the approval of your doctor -STOP and seek medical care immediately if you have any chest pain, chest discomfort or trouble breathing when starting or increasing exercise  -move and stretch your body, legs, feet and arms when sitting for long periods -Physical activity guidelines for optimal health in adults: -get at least 150 minutes per week of moderate exercise (can talk, but not sing); this is about 20-30 minutes of sustained activity 5-7 days per  week or two 10-15 minute episodes of sustained activity 5-7 days per week -do some muscle building/resistance training at least 2 days per week  -balance exercises 3+ days per week:   Stand somewhere where you  have something sturdy to hold onto if you lose balance.    1) lift up on toes, start with 5x per day and work up to 20x   2) stand and lift on leg straight out to the side so that foot is a few inches of the floor, start with 5x each side and work up to 20x each side   3) stand on one foot, start with 5 seconds each side and work up to 20 seconds on each side  If you need ideas or help with getting more active:  -Silver sneakers https://tools.silversneakers.com  -Walk with a Doc: http://www.duncan-williams.com/  -try to include resistance (weight lifting/strength building) and balance exercises twice per week: or the following link for ideas: http://castillo-powell.com/  BuyDucts.dk  STRESS MANAGEMENT: -can try meditating, or just sitting quietly with deep breathing while intentionally relaxing all parts of your body for 5 minutes daily -if you need further help with stress, anxiety or depression please follow up with your primary doctor or contact the wonderful folks at [facility]: [phone]  SOCIAL CONNECTIONS: -options in [place] if you wish to engage in more social and exercise related activities:  -Silver sneakers https://tools.silversneakers.com  -Walk with a Doc: http://www.duncan-williams.com/  -Check out the [facility] Active Adults 50+ section on the [place] [employer] (hiking clubs, book clubs, cards and games, chess, exercise classes, aquatic classes and much more) - see the website for details: https://www.-[place].gov/departments/parks-recreation/active-adults50  -YouTube has lots of exercise videos for different ages and abilities as well  -[facility] (a variety of indoor and outdoor inperson activities for adults). [phone]. [address]  -Virtual Online Classes (a variety of topics): see seniorplanet.org or call  [phone]  -consider volunteering at a school, hospice center, church, senior center or elsewhere    ADVANCED HEALTHCARE DIRECTIVES:  [place] Advanced Directives assistance:   ExpressWeek.com.cy  Everyone should have advanced health care directives in place. This is so that you get the care you want, should you ever be in a situation where you are unable to make your own medical decisions.   From the [place] Advanced Directive Website: "Advance Health Care Directives are legal documents in which you give written instructions about your health care if, in the future, you cannot speak for yourself.   A health care power of attorney allows you to name a person you trust to make your health care decisions if you cannot make them yourself. A declaration of a desire for a natural death (or living will) is document, which states that you desire not to have your life prolonged by extraordinary measures if you have a terminal or incurable illness or if you are in a vegetative state. An advance instruction for mental health treatment makes a declaration of instructions, information and preferences regarding your mental health treatment. It also states that you are aware that the advance instruction authorizes a mental health treatment provider to act according to your wishes. It may also outline your consent or refusal of mental health treatment. A declaration of an anatomical gift allows anyone over the age of 42 to make a gift by will, organ donor card or other document."   Please see the following website or an elder law attorney for forms, FAQs and for completion of  advanced directives: Secretary/administrator Health Care Directives Advance Health Care Directives (http://guzman.com/)  Or copy and paste the following to your web browser: PoshChat.fi

## 2023-05-11 NOTE — Progress Notes (Signed)
PATIENT CHECK-IN and HEALTH RISK ASSESSMENT QUESTIONNAIRE:  -completed by phone/video for upcoming Medicare Preventive Visit  -PLEASE SELECT "NOT IN PERSON" for the method of visit.    Pre-Visit Check-in: 1)Vitals (height, wt, BP, etc) - record in vitals section for visit on day of visit Request home vitals (wt, BP, etc.) and enter into vitals, THEN update Vital Signs SmartPhrase below at the top of the HPI. See below.  2)Review and Update Medications, Allergies PMH, Surgeries, Social history in Epic 3)Hospitalizations in the last year with date/reason? no  4)Review and Update Care Team (patient's specialists) in Epic 5) Complete PHQ9 in Epic  6) Complete Fall Screening in Epic 7)Review all Health Maintenance Due and order under PCP if not done.  Medicare Wellness Patient Questionnaire:  Answer theses question about your habits: How often do you have a drink containing alcohol?1 glass of wine hear and thre Have you ever smoked?No Quit date if applicable? N/A  How many packs a day do/did you smoke? N/a Do you use smokeless tobacco?No Do you use an illicit drugs?No On average, how many days per week do you engage in moderate to strenuous exercise (like a brisk walk)? none Typical breakfast: Varies - daughters take care of meals and she reports eats healthy meals Typical lunch: Varies  Typical dinner: Varies  Typical snacks: Varies   Beverages: Varies, water and bubbly, boost   Answer theses question about your everyday activities: Can you perform most household chores?yes Are you deaf or have significant trouble hearing? Yes  Do you feel that you have a problem with memory? Yes  Do you feel safe at home? Yes  Last dentist visit? 3 weeks ago 8. Do you have any difficulty performing your everyday activities? No Are you having any difficulty walking, taking medications on your own, and or difficulty managing daily home needs?No Do you have difficulty walking or climbing  stairs?No Do you have difficulty dressing or bathing? No Do you have difficulty doing errands alone such as visiting a doctor's office or shopping? Yes  Do you currently have any difficulty preparing food and eating? Yes  Do you currently have any difficulty using the toilet? No Do you have any difficulty managing your finances? No Do you have any difficulties with housekeeping of managing your housekeeping?No   Do you have Advanced Directives in place (Living Will, Healthcare Power or Attorney)? No - but she has the paperwork and plans to complete.   Last eye Exam and location? 03/2021 - has been awhile.    Do you currently use prescribed or non-prescribed narcotic or opioid pain medications? No  Do you have a history or close family history of breast, ovarian, tubal or peritoneal cancer or a family member with BRCA (breast cancer susceptibility 1 and 2) gene mutations? Yes Breast cancer    Nurse/Assistant Credentials/time stamp: MG 4:34 pm    ----------------------------------------------------------------------------------------------------------------------------------------------------------------------------------------------------------------------  Because this visit was a virtual/telehealth visit, some criteria may be missing or patient reported. Any vitals not documented were not able to be obtained and vitals that have been documented are patient reported.    MEDICARE ANNUAL PREVENTIVE VISIT WITH PROVIDER: (Welcome to Medicare, initial annual wellness or annual wellness exam)  Virtual Visit via Phone Note  I connected with Alexa Brown on 05/11/23 by phone and verified that I am speaking with the correct person using two identifiers. Pt prefers phone visit.  Location patient: home Location provider:work or home office Persons participating in the virtual visit: patient, provider  Concerns and/or follow up today: no concerns. Requests refill on triamcinolone  - uses rarely on face for itchy dry skin.    See HM section in Epic for other details of completed HM.    ROS: negative for report of fevers, unintentional weight loss, vision changes, vision loss, hearing loss or change, chest pain, sob, hemoptysis, melena, hematochezia, hematuria, falls, bleeding or bruising, thoughts of suicide or self harm, memory loss  Patient-completed extensive health risk assessment - reviewed and discussed with the patient: See Health Risk Assessment completed with patient prior to the visit either above or in recent phone note. This was reviewed in detailed with the patient today and appropriate recommendations, orders and referrals were placed as needed per Summary below and patient instructions.   Review of Medical History: -PMH, PSH, Family History and current specialty and care providers reviewed and updated and listed below   Patient Care Team: [place], Jayona G, MD as PCP - General (Family Medicine) Sherrill Raring, [facility] (Pharmacist)   Past Medical History:  Diagnosis Date   Chicken pox    Depression    Diabetes mellitus without complication (HCC)    GERD (gastroesophageal reflux disease)    Hyperlipidemia    Hypertension    Stroke [facility])    UTI (urinary tract infection)     Past Surgical History:  Procedure Laterality Date   ABDOMINAL HYSTERECTOMY     APPENDECTOMY     BREAST BIOPSY Left 2014   BREAST SURGERY     biopsy    Social History   Socioeconomic History   Marital status: Widowed    Spouse name: Not on file   Number of children: Not on file   Years of education: Not on file   Highest education level: 12th grade  Occupational History   Occupation: N/A  Tobacco Use   Smoking status: Never   Smokeless tobacco: Never  Vaping Use   Vaping status: Never Used  Substance and Sexual Activity   Alcohol use: Yes    Comment: occassional wine   Drug use: No   Sexual activity: Not Currently  Other Topics Concern   Not on file   Social History Narrative   Pt lives in single story home with her daughter   Has 2 children   Some college education   Retired Copywriter, advertising for [employer]   Social Drivers of Health   Financial Resource Strain: Low Risk  (05/11/2023)   Overall Financial Resource Strain (CARDIA)    Difficulty of Paying Living Expenses: Not hard at all  Food Insecurity: No Food Insecurity (05/11/2023)   Hunger Vital Sign    Worried About Running Out of Food in the Last Year: Never true    Ran Out of Food in the Last Year: Never true  Transportation Needs: No Transportation Needs (05/11/2023)   PRAPARE - Administrator, Civil Service (Medical): No    Lack of Transportation (Non-Medical): No  Physical Activity: Inactive (05/11/2023)   Exercise Vital Sign    Days of Exercise per Week: 0 days    Minutes of Exercise per Session: 0 min  Stress: No Stress Concern Present (05/11/2023)   [employer] of Occupational Health - Occupational Stress Questionnaire    Feeling of Stress : Not at all  Social Connections: Socially Isolated (05/11/2023)   Social Connection and Isolation Panel [NHANES]    Frequency of Communication with Friends and Family: More than three times a week    Frequency of Social  Gatherings with Friends and Family: More than three times a week    Attends Religious Services: Never    Database administrator or Organizations: No    Attends Banker Meetings: Never    Marital Status: Widowed  Intimate Partner Violence: Not At Risk (05/11/2023)   Humiliation, Afraid, Rape, and Kick questionnaire    Fear of Current or Ex-Partner: No    Emotionally Abused: No    Physically Abused: No    Sexually Abused: No    Family History  Problem Relation Age of Onset   Hyperlipidemia Mother    Heart disease Mother    Stroke Mother    Hypertension Mother    Hyperlipidemia Father    Heart disease Father    Stroke Father    Hypertension Father    Asthma Father     Parkinson's disease Sister     Current Outpatient Medications on File Prior to Visit  Medication Sig Dispense Refill   Accu-Chek Softclix Lancets lancets Use to check blood sugars 1-2 times daily. Dx:E11.65 200 each 12   acetaminophen (TYLENOL) 325 MG tablet Take 650 mg by mouth every 6 (six) hours as needed for mild pain, fever or headache.     allopurinol (ZYLOPRIM) 100 MG tablet TAKE 1 TABLET BY MOUTH TWICE A DAY 180 tablet 1   Alpha-Lipoic Acid 600 MG TABS Take 600 mg by mouth daily.     b complex vitamins capsule Take 1 capsule by mouth daily.     Blood Glucose Monitoring Suppl (ACCU-CHEK GUIDE) w/Device KIT Use to check blood sugars 1-2 times daily. Dx:E11.65 1 kit 0   buPROPion ER (WELLBUTRIN SR) 100 MG 12 hr tablet TAKE 1 TABLET BY MOUTH TWICE A DAY 180 tablet 1   calcium carbonate (CALCIUM 600) 600 MG TABS tablet Take 600 mg by mouth daily.     DULoxetine (CYMBALTA) 60 MG capsule TAKE 1 CAPSULE BY MOUTH EVERY DAY 90 capsule 1   ELIQUIS 5 MG TABS tablet TAKE 1 TABLET BY MOUTH TWICE A DAY 180 tablet 3   ezetimibe (ZETIA) 10 MG tablet TAKE 1 TABLET BY MOUTH EVERY DAY 90 tablet 3   glucose blood (ACCU-CHEK GUIDE) test strip Use to test blood sugars 1-2 times daily. Dx:e11.65 200 each 12   Homeopathic Products (THERAWORX RELIEF EX) Apply 1 application topically daily as needed (foot cramps).     ketoconazole (NIZORAL) 2 % cream SMARTSIG:1 Application Topical 1 to 2 Times Daily     L-Lysine 500 MG TABS Take 1 tablet by mouth daily.     Lancet Devices (ACCU-CHEK SOFTCLIX) lancets Use to test blood sugar once a day. 100 each 2   LIVALO 2 MG TABS TAKE 1 TABLET BY MOUTH EVERY DAY 90 tablet 3   metroNIDAZOLE (METROCREAM) 0.75 % cream Apply topically 2 (two) times daily. Apply a thin layer twice a day to a clean dry face. 45 g 0   Multiple Vitamin (MULTIVITAMIN) capsule Take 1 capsule by mouth daily.      olmesartan (BENICAR) 20 MG tablet TAKE 1 TABLET BY MOUTH EVERY DAY 90 tablet 2    oxybutynin (DITROPAN-XL) 10 MG 24 hr tablet Take 10 mg by mouth daily.     pantoprazole (PROTONIX) 20 MG tablet Take 1 tablet (20 mg total) by mouth daily. 90 tablet 2   No current facility-administered medications on file prior to visit.    No Known Allergies     Physical Exam Vitals requested from patient and  listed below if patient had equipment and was able to obtain at home for this virtual visit: There were no vitals filed for this visit. Estimated body mass index is 25.37 kg/m as calculated from the following:   Height as of 03/15/23: 5\' 7"  (1.702 m).   Weight as of 03/15/23: 162 lb (73.5 kg).  EKG (optional): deferred due to virtual visit  GENERAL: alert, oriented, no acute distress detected, full vision exam deferred due to pandemic and/or virtual encounter  PSYCH/NEURO: pleasant and cooperative, no obvious depression or anxiety, speech and thought processing grossly intact, Cognitive function grossly intact  Flowsheet Row Office Visit from 05/11/2023 in [facility] [place]  PHQ-9 Total Score 4           05/11/2023    4:22 PM 01/02/2023    8:34 AM 09/16/2022    2:03 PM 02/16/2022    1:25 PM 12/28/2021    9:03 AM  Depression screen PHQ 2/9  Decreased Interest 0 0 0 0 2  Down, Depressed, Hopeless 1 0 0 0 2  PHQ - 2 Score 1 0 0 0 4  Altered sleeping 0 0 0 0 2  Tired, decreased energy 0 0 0 0 2  Change in appetite 0 0 0 0 2  Feeling bad or failure about yourself  0 0 0 0 2  Trouble concentrating 3 0 0 0 1  Moving slowly or fidgety/restless 0 0 0 0 0  Suicidal thoughts 0 0 0 0 1  PHQ-9 Score 4 0 0 0 14  Difficult doing work/chores Not difficult at all Not difficult at all Not difficult at all Not difficult at all Somewhat difficult       09/03/2021    8:38 AM 09/18/2021    4:44 PM 12/28/2021    9:03 AM 02/16/2022    1:31 PM 05/11/2023    4:22 PM  Fall Risk  Falls in the past year? 1  1 1 1   Was there an injury with Fall? 1  1 1 1   Was  there an injury with Fall? - Comments    Dislocated rt elbow, followed by medical attention/Orthopedic   Fall Risk Category Calculator 3  3 3 3   Fall Risk Category (Retired) [employer]   (RETIRED) Patient Fall Risk Level High fall risk High fall risk High fall risk Low fall risk   Patient at Risk for Falls Due to History of fall(s)  History of fall(s) Impaired balance/gait No Fall Risks  Fall risk Follow up Falls evaluation completed  Falls evaluation completed;Education provided Falls prevention discussed Falls evaluation completed   Does fall at times. Balance issues. Did do PT in the past - but didn't feel like it helped.   SUMMARY AND PLAN:  Encounter for annual wellness exam in Medicare patient   Discussed applicable health maintenance/preventive health measures and advised and referred or ordered per patient preferences: -she agrees to schedule eye exam -she is not sure about getting the covid vaccine, discussed recommendations/risks/benefits and advised can get at the pharmacy if decides to do Health Maintenance  Topic Date Due   OPHTHALMOLOGY EXAM  09/21/2021   COVID-19 Vaccine (4 - 2024-25 season) 11/27/2022   HEMOGLOBIN A1C  07/03/2023   Diabetic kidney evaluation - eGFR measurement  01/02/2024   Diabetic kidney evaluation - Urine ACR  01/02/2024   FOOT EXAM  01/02/2024   Medicare Annual Wellness (AWV)  05/10/2024   DTaP/Tdap/Td (2 - Td or Tdap) 12/23/2025  Pneumonia Vaccine 72+ Years old  Completed   INFLUENZA VACCINE  Completed   DEXA SCAN  Completed   Zoster Vaccines- Shingrix  Completed   HPV VACCINES  Aged Out   Hepatitis C Screening  Discontinued      Education and counseling on the following was provided based on the above review of health and a plan/checklist for the patient, along with additional information discussed, was provided for the patient in the patient instructions :  -Advised on importance of completing advanced directives, discussed options  for completing and provided information in patient instructions as well -Provided counseling and plan for increased risk of falling if applicable per above screening. Reviewed and discussed safe balance exercises that can be done at home to improve balance and discussed exercise guidelines for adults with include balance exercises at least 3 days per week.  -Advised and counseled on a healthy lifestyle - including the importance of a healthy diet, regular physical activity -Reviewed patient's current diet. Advised and counseled on a whole foods based healthy diet. A summary of a healthy diet was provided in the Patient Instructions.  -reviewed patient's current physical activity level and discussed exercise guidelines for adults. Discussed community resources and ideas for safe exercise at home to assist in meeting exercise guideline recommendations in a safe and healthy way.  -Advise yearly dental visits at minimum and regular eye exams -Advised and counseled on alcohol safe limits, risks  Follow up: see patient instructions     Patient Instructions  I really enjoyed getting to talk with you today! I am available on Tuesdays and Thursdays for virtual visits if you have any questions or concerns, or if I can be of any further assistance.   CHECKLIST FROM ANNUAL WELLNESS VISIT:  -Follow up (please call to schedule if not scheduled after visit):   -yearly for annual wellness visit with primary care office  Here is a list of your preventive care/health maintenance measures and the plan for each if any are due:  PLAN For any measures below that may be due:  -please schedule eye exam -if you wish to get the covid vaccine you can do so at the pharmcy  Health Maintenance  Topic Date Due   OPHTHALMOLOGY EXAM  09/21/2021   COVID-19 Vaccine (4 - 2024-25 season) 11/27/2022   HEMOGLOBIN A1C  07/03/2023   Diabetic kidney evaluation - eGFR measurement  01/02/2024   Diabetic kidney evaluation -  Urine ACR  01/02/2024   FOOT EXAM  01/02/2024   Medicare Annual Wellness (AWV)  05/10/2024   DTaP/Tdap/Td (2 - Td or Tdap) 12/23/2025   Pneumonia Vaccine 41+ Years old  Completed   INFLUENZA VACCINE  Completed   DEXA SCAN  Completed   Zoster Vaccines- Shingrix  Completed   HPV VACCINES  Aged Out   Hepatitis C Screening  Discontinued    -See a dentist at least yearly  -Get your eyes checked and then per your eye specialist's recommendations  -Other issues addressed today:   -I have included below further information regarding a healthy whole foods based diet, physical activity guidelines for adults, stress management and opportunities for social connections. I hope you find this information useful.   -----------------------------------------------------------------------------------------------------------------------------------------------------------------------------------------------------------------------------------------------------------    NUTRITION: -eat real food: lots of colorful vegetables (half the plate) and fruits -5-7 servings of vegetables and fruits per day (fresh or steamed is best), exp. 2 servings of vegetables with lunch and dinner and 2 servings of fruit per day. Berries and greens  such as kale and collards are great choices.  -consume on a regular basis:  fresh fruits, fresh veggies, fish, nuts, seeds, healthy oils (such as olive oil, avocado oil), whole grains (make sure first ingredient on label contains the word "whole"), -can eat small amounts of dairy and lean meat (no larger than the palm of your hand), but avoid processed meats such as ham, bacon, lunch meat, etc. -drink water -try to avoid fast food and pre-packaged foods, processed meat, ultra processed foods (donuts, candy, etc.) -most experts advise limiting sodium to < 2300mg  per day, should limit further is any chronic conditions such as high blood pressure, heart disease, diabetes, etc. The  American Heart Association advised that < 1500mg  is is ideal -try to avoid foods that contain any ingredients with names you do not recognize  -try to avoid foods with added sugar or sweeteners/sweets  -try to avoid sweet drinks -try to avoid white rice, white bread, pasta (unless whole grain)  EXERCISE GUIDELINES FOR ADULTS: -if you wish to increase your physical activity, do so gradually and with the approval of your doctor -STOP and seek medical care immediately if you have any chest pain, chest discomfort or trouble breathing when starting or increasing exercise  -move and stretch your body, legs, feet and arms when sitting for long periods -Physical activity guidelines for optimal health in adults: -get at least 150 minutes per week of moderate exercise (can talk, but not sing); this is about 20-30 minutes of sustained activity 5-7 days per week or two 10-15 minute episodes of sustained activity 5-7 days per week -do some muscle building/resistance training at least 2 days per week  -balance exercises 3+ days per week:   Stand somewhere where you have something sturdy to hold onto if you lose balance.    1) lift up on toes, start with 5x per day and work up to 20x   2) stand and lift on leg straight out to the side so that foot is a few inches of the floor, start with 5x each side and work up to 20x each side   3) stand on one foot, start with 5 seconds each side and work up to 20 seconds on each side  If you need ideas or help with getting more active:  -Silver sneakers https://tools.silversneakers.com  -Walk with a Doc: http://www.duncan-williams.com/  -try to include resistance (weight lifting/strength building) and balance exercises twice per week: or the following link for ideas: http://castillo-powell.com/  BuyDucts.dk  STRESS MANAGEMENT: -can try meditating, or just sitting quietly with  deep breathing while intentionally relaxing all parts of your body for 5 minutes daily -if you need further help with stress, anxiety or depression please follow up with your primary doctor or contact the wonderful folks at [facility]: [phone]  SOCIAL CONNECTIONS: -options in [place] if you wish to engage in more social and exercise related activities:  -Silver sneakers https://tools.silversneakers.com  -Walk with a Doc: http://www.duncan-williams.com/  -Check out the [facility] Active Adults 50+ section on the [place] of [employer] (hiking clubs, book clubs, cards and games, chess, exercise classes, aquatic classes and much more) - see the website for details: https://www.[place].gov/departments/parks-recreation/active-adults50  -YouTube has lots of exercise videos for different ages and abilities as well  -[facility] (a variety of indoor and outdoor inperson activities for adults). [phone]. [address]  -Virtual Online Classes (a variety of topics): see seniorplanet.org or call [phone]  -consider volunteering at a school, hospice center, church,  senior center or elsewhere    ADVANCED HEALTHCARE DIRECTIVES:  [place] Advanced Directives assistance:   ExpressWeek.com.cy  Everyone should have advanced health care directives in place. This is so that you get the care you want, should you ever be in a situation where you are unable to make your own medical decisions.   From the [place] Advanced Directive Website: "Advance Health Care Directives are legal documents in which you give written instructions about your health care if, in the future, you cannot speak for yourself.   A health care power of attorney allows you to name a person you trust to make your health care decisions if you cannot make them yourself. A declaration of a desire for a natural death (or living will) is  document, which states that you desire not to have your life prolonged by extraordinary measures if you have a terminal or incurable illness or if you are in a vegetative state. An advance instruction for mental health treatment makes a declaration of instructions, information and preferences regarding your mental health treatment. It also states that you are aware that the advance instruction authorizes a mental health treatment provider to act according to your wishes. It may also outline your consent or refusal of mental health treatment. A declaration of an anatomical gift allows anyone over the age of 62 to make a gift by will, organ donor card or other document."   Please see the following website or an elder law attorney for forms, FAQs and for completion of advanced directives: [place] TEFL teacher Health Care Directives Advance Health Care Directives (http://guzman.com/)  Or copy and paste the following to your web browser: PoshChat.fi          Terressa Koyanagi, DO

## 2023-05-16 ENCOUNTER — Other Ambulatory Visit: Payer: Self-pay | Admitting: Family Medicine

## 2023-05-16 DIAGNOSIS — G629 Polyneuropathy, unspecified: Secondary | ICD-10-CM

## 2023-05-16 DIAGNOSIS — F331 Major depressive disorder, recurrent, moderate: Secondary | ICD-10-CM

## 2023-05-26 DIAGNOSIS — R350 Frequency of micturition: Secondary | ICD-10-CM | POA: Diagnosis not present

## 2023-06-21 ENCOUNTER — Ambulatory Visit (INDEPENDENT_AMBULATORY_CARE_PROVIDER_SITE_OTHER): Admitting: Internal Medicine

## 2023-06-21 ENCOUNTER — Ambulatory Visit: Payer: Medicare Other | Admitting: Podiatry

## 2023-06-21 ENCOUNTER — Encounter: Payer: Self-pay | Admitting: Internal Medicine

## 2023-06-21 ENCOUNTER — Ambulatory Visit: Payer: Self-pay

## 2023-06-21 VITALS — BP 110/70 | HR 76 | Temp 97.1°F | Wt 166.7 lb

## 2023-06-21 DIAGNOSIS — W19XXXA Unspecified fall, initial encounter: Secondary | ICD-10-CM | POA: Diagnosis not present

## 2023-06-21 DIAGNOSIS — S8012XA Contusion of left lower leg, initial encounter: Secondary | ICD-10-CM | POA: Diagnosis not present

## 2023-06-21 NOTE — Telephone Encounter (Signed)
 Copied from CRM [id number]. Topic: Clinical - Red Word Triage >> Jun 21, 2023 11:42 AM Marica Otter wrote: Red Word that prompted transfer to Nurse Triage: Patient fell 2 days ago, has know on left leg   Chief Complaint: Larey Seat outside 2 days ago watering plants. Injured left knee Symptoms: Knot under knee Frequency: 2 days ago Pertinent Negatives: Patient denies  Disposition: [] ED /[] Urgent Care (no appt availability in office) / [x] Appointment(In office/virtual)/ []  [facility] [facility]/ [] Home Care/ [] Refused Recommended Disposition /[]  Mobile Bus/ []  Follow-up with PCP Additional Notes: grees with appointment.  Reason for Disposition  MILD weakness (i.e., does not interfere with ability to work, go to school, normal activities)  (Exception: Mild weakness is a chronic symptom.)  Answer Assessment - Initial Assessment Questions 1. MECHANISM: "How did the fall happen?"     Outside and fell on concrete 2. DOMESTIC VIOLENCE AND ELDER ABUSE SCREENING: "Did you fall because someone pushed you or tried to hurt you?" If Yes, ask: "Are you safe now?"     No 3. ONSET: "When did the fall happen?" (e.g., minutes, hours, or days ago)     2 days ago 4. LOCATION: "What part of the body hit the ground?" (e.g., back, buttocks, head, hips, knees, hands, head, stomach)     Left knee 5. INJURY: "Did you hurt (injure) yourself when you fell?" If Yes, ask: "What did you injure? Tell me more about this?" (e.g., body area; type of injury; pain severity)"     Left knee 6. PAIN: "Is there any pain?" If Yes, ask: "How bad is the pain?" (e.g., Scale 1-10; or mild,  moderate, severe)   - NONE (0): No pain   - MILD (1-3): Doesn't interfere with normal activities    - MODERATE (4-7): Interferes with normal activities or awakens from sleep    - SEVERE (8-10): Excruciating pain, unable to do any normal activities      Moderate 7. SIZE: For cuts, bruises, or swelling, ask: "How large is it?" (e.g., inches  or centimeters)      N/A 8. PREGNANCY: "Is there any chance you are pregnant?" "When was your last menstrual period?"     No 9. OTHER SYMPTOMS: "Do you have any other symptoms?" (e.g., dizziness, fever, weakness; new onset or worsening).      No 10. CAUSE: "What do you think caused the fall (or falling)?" (e.g., tripped, dizzy spell)       Tripped.  Protocols used: Falls and [facility]

## 2023-06-21 NOTE — Progress Notes (Signed)
 Established Patient Office Visit     CC/Reason for Visit: Fall with bruise on leg  HPI: Alexa Brown is a 83 y.o. female who is coming in today for the above mentioned reasons.  She had a mechanical fall 2 days ago at her house where she tripped going up a step and fell on her knees.  She has been able to bear weight and is walking as normal with her walker.  She is on Eliquis and has noticed a small hematoma on the upper aspect of her left lower leg right below the knee.   Past Medical/Surgical History: Past Medical History:  Diagnosis Date   Chicken pox    Depression    Diabetes mellitus without complication (HCC)    GERD (gastroesophageal reflux disease)    Hyperlipidemia    Hypertension    Stroke [facility])    UTI (urinary tract infection)     Past Surgical History:  Procedure Laterality Date   ABDOMINAL HYSTERECTOMY     APPENDECTOMY     BREAST BIOPSY Left 2014   BREAST SURGERY     biopsy    Social History:  reports that she has never smoked. She has never used smokeless tobacco. She reports current alcohol use. She reports that she does not use drugs.  Allergies: No Known Allergies  Family History:  Family History  Problem Relation Age of Onset   Hyperlipidemia Mother    Heart disease Mother    Stroke Mother    Hypertension Mother    Hyperlipidemia Father    Heart disease Father    Stroke Father    Hypertension Father    Asthma Father    Parkinson's disease Sister      Current Outpatient Medications:    Accu-Chek Softclix Lancets lancets, Use to check blood sugars 1-2 times daily. Dx:E11.65, Disp: 200 each, Rfl: 12   acetaminophen (TYLENOL) 325 MG tablet, Take 650 mg by mouth every 6 (six) hours as needed for mild pain, fever or headache., Disp: , Rfl:    allopurinol (ZYLOPRIM) 100 MG tablet, TAKE 1 TABLET BY MOUTH TWICE A DAY, Disp: 180 tablet, Rfl: 1   Alpha-Lipoic Acid 600 MG TABS, Take 600 mg by mouth daily., Disp: , Rfl:    b complex  vitamins capsule, Take 1 capsule by mouth daily., Disp: , Rfl:    Blood Glucose Monitoring Suppl (ACCU-CHEK GUIDE) w/Device KIT, Use to check blood sugars 1-2 times daily. Dx:E11.65, Disp: 1 kit, Rfl: 0   buPROPion ER (WELLBUTRIN SR) 100 MG 12 hr tablet, TAKE 1 TABLET BY MOUTH TWICE A DAY, Disp: 180 tablet, Rfl: 1   calcium carbonate (CALCIUM 600) 600 MG TABS tablet, Take 600 mg by mouth daily., Disp: , Rfl:    DULoxetine (CYMBALTA) 60 MG capsule, TAKE 1 CAPSULE BY MOUTH EVERY DAY, Disp: 90 capsule, Rfl: 1   ELIQUIS 5 MG TABS tablet, TAKE 1 TABLET BY MOUTH TWICE A DAY, Disp: 180 tablet, Rfl: 3   ezetimibe (ZETIA) 10 MG tablet, TAKE 1 TABLET BY MOUTH EVERY DAY, Disp: 90 tablet, Rfl: 3   glucose blood (ACCU-CHEK GUIDE) test strip, Use to test blood sugars 1-2 times daily. Dx:e11.65, Disp: 200 each, Rfl: 12   Homeopathic Products (THERAWORX RELIEF EX), Apply 1 application topically daily as needed (foot cramps)., Disp: , Rfl:    ketoconazole (NIZORAL) 2 % cream, SMARTSIG:1 Application Topical 1 to 2 Times Daily, Disp: , Rfl:    L-Lysine 500 MG TABS, Take 1  tablet by mouth daily., Disp: , Rfl:    Lancet Devices (ACCU-CHEK SOFTCLIX) lancets, Use to test blood sugar once a day., Disp: 100 each, Rfl: 2   LIVALO 2 MG TABS, TAKE 1 TABLET BY MOUTH EVERY DAY, Disp: 90 tablet, Rfl: 3   metroNIDAZOLE (METROCREAM) 0.75 % cream, Apply topically 2 (two) times daily. Apply a thin layer twice a day to a clean dry face., Disp: 45 g, Rfl: 0   Multiple Vitamin (MULTIVITAMIN) capsule, Take 1 capsule by mouth daily. , Disp: , Rfl:    olmesartan (BENICAR) 20 MG tablet, TAKE 1 TABLET BY MOUTH EVERY DAY, Disp: 90 tablet, Rfl: 2   oxybutynin (DITROPAN-XL) 10 MG 24 hr tablet, Take 10 mg by mouth daily., Disp: , Rfl:    pantoprazole (PROTONIX) 20 MG tablet, Take 1 tablet (20 mg total) by mouth daily., Disp: 90 tablet, Rfl: 2   triamcinolone cream (KENALOG) 0.5 %, Apply 1 Application topically 3 (three) times daily., Disp: 30  g, Rfl: 0  Review of Systems:  Negative unless indicated in HPI.   Physical Exam: Vitals:   06/21/23 1518  BP: 110/70  Pulse: 76  Temp: (!) 97.1 F (36.2 C)  TempSrc: Oral  SpO2: 96%  Weight: 166 lb 11.2 oz (75.6 kg)    Body mass index is 26.11 kg/m.   Physical Exam Musculoskeletal:       Legs:     Comments: Small circular hematoma about 2 inches below the left knee.      Impression and Plan:  Fall, initial encounter  Hematoma of left lower leg  -For now have advised icing and observation, no x-ray needed.   Time spent:22 minutes reviewing chart, interviewing and examining patient and formulating plan of care.     Chaya Jan, MD [facility]

## 2023-06-23 DIAGNOSIS — R350 Frequency of micturition: Secondary | ICD-10-CM | POA: Diagnosis not present

## 2023-06-24 ENCOUNTER — Emergency Department (HOSPITAL_COMMUNITY)

## 2023-06-24 ENCOUNTER — Encounter (HOSPITAL_COMMUNITY): Payer: Self-pay | Admitting: Emergency Medicine

## 2023-06-24 ENCOUNTER — Other Ambulatory Visit: Payer: Self-pay

## 2023-06-24 ENCOUNTER — Emergency Department (HOSPITAL_COMMUNITY)
Admission: EM | Admit: 2023-06-24 | Discharge: 2023-06-24 | Disposition: A | Discharge status: Home / Self Care | Attending: Emergency Medicine | Admitting: Emergency Medicine

## 2023-06-24 DIAGNOSIS — Y92019 Unspecified place in single-family (private) house as the place of occurrence of the external cause: Secondary | ICD-10-CM | POA: Diagnosis not present

## 2023-06-24 DIAGNOSIS — W19XXXA Unspecified fall, initial encounter: Secondary | ICD-10-CM | POA: Diagnosis not present

## 2023-06-24 DIAGNOSIS — S0003XA Contusion of scalp, initial encounter: Secondary | ICD-10-CM | POA: Diagnosis not present

## 2023-06-24 DIAGNOSIS — Z7901 Long term (current) use of anticoagulants: Secondary | ICD-10-CM | POA: Diagnosis not present

## 2023-06-24 DIAGNOSIS — I10 Essential (primary) hypertension: Secondary | ICD-10-CM | POA: Diagnosis not present

## 2023-06-24 DIAGNOSIS — E119 Type 2 diabetes mellitus without complications: Secondary | ICD-10-CM | POA: Insufficient documentation

## 2023-06-24 DIAGNOSIS — Z043 Encounter for examination and observation following other accident: Secondary | ICD-10-CM | POA: Diagnosis not present

## 2023-06-24 DIAGNOSIS — S0990XA Unspecified injury of head, initial encounter: Secondary | ICD-10-CM | POA: Diagnosis present

## 2023-06-24 MED ORDER — ACETAMINOPHEN 325 MG PO TABS
650.0000 mg | ORAL_TABLET | Freq: Once | ORAL | Status: AC
  Administered 2023-06-24: 650 mg via ORAL
  Filled 2023-06-24: qty 2

## 2023-06-24 NOTE — ED Triage Notes (Signed)
 Pt to ED from home with daughter after having unwitnessed fall tonight in the hallway and hit head either on doorframe or hardwood floor.  Pt unsure why she fell.  Hematoma noted to left forehead.  Denies other pain, denies visual changes, denies LOC at time of fall.  Pt is on eliquis for prior PE.

## 2023-06-24 NOTE — ED Notes (Signed)
 Patient transported to CT

## 2023-06-24 NOTE — ED Provider Notes (Signed)
 [place] EMERGENCY DEPARTMENT AT [facility] Provider Note   CSN: [id number] Arrival date & time: 06/24/23  1914     History  Chief Complaint  Patient presents with   Marletta Lor    Alexa Brown is a 83 y.o. female.  The history is provided by the patient.  Fall  She has history of hypertension, diabetes, hyperlipidemia, stroke and comes in following a fall at home.  She does not remember falling, but noticed that she has a bump on her head on the left side.  Family member who is with her states that her mental status is normal except for not remembering the fall.  She also had another fall recently with a hematoma on her left leg.  She is anticoagulated on apixaban.   Home Medications Prior to Admission medications   Medication Sig Start Date End Date Taking? Authorizing Provider  Accu-Chek Softclix Lancets lancets Use to check blood sugars 1-2 times daily. Dx:E11.65 10/18/21   [place], Laurenashley G, MD  acetaminophen (TYLENOL) 325 MG tablet Take 650 mg by mouth every 6 (six) hours as needed for mild pain, fever or headache.    [provider]  allopurinol (ZYLOPRIM) 100 MG tablet TAKE 1 TABLET BY MOUTH TWICE A DAY 02/15/23   [place], Abella G, MD  Alpha-Lipoic Acid 600 MG TABS Take 600 mg by mouth daily.    [provider]  b complex vitamins capsule Take 1 capsule by mouth daily.    [provider]  Blood Glucose Monitoring Suppl (ACCU-CHEK GUIDE) w/Device KIT Use to check blood sugars 1-2 times daily. Dx:E11.65 10/18/21   [place], Chrysta G, MD  buPROPion ER [facility] SR) 100 MG 12 hr tablet TAKE 1 TABLET BY MOUTH TWICE A DAY 05/16/23   [place], Sherrie G, MD  calcium carbonate (CALCIUM 600) 600 MG TABS tablet Take 600 mg by mouth daily.    [provider]  DULoxetine (CYMBALTA) 60 MG capsule TAKE 1 CAPSULE BY MOUTH EVERY DAY 05/16/23   [place], Lenisha G, MD  ELIQUIS 5 MG TABS tablet TAKE 1 TABLET BY MOUTH TWICE A DAY 07/13/22   [place], Aralyn G,  MD  ezetimibe (ZETIA) 10 MG tablet TAKE 1 TABLET BY MOUTH EVERY DAY 12/12/22   [place], Jax G, MD  glucose blood (ACCU-CHEK GUIDE) test strip Use to test blood sugars 1-2 times daily. Dx:e11.65 10/18/21   [place], Akyia G, MD  Homeopathic Products [facility] RELIEF EX) Apply 1 application topically daily as needed (foot cramps).    [provider]  ketoconazole (NIZORAL) 2 % cream SMARTSIG:1 Application Topical 1 to 2 Times Daily 11/06/20   [provider]  L-Lysine 500 MG TABS Take 1 tablet by mouth daily.    [provider]  Lancet Devices [facility]) lancets Use to test blood sugar once a day. 03/31/16   [place], Shelley G, MD  LIVALO 2 MG TABS TAKE 1 TABLET BY MOUTH EVERY DAY 12/12/22   [place], Atlas G, MD  metroNIDAZOLE (METROCREAM) 0.75 % cream Apply topically 2 (two) times daily. Apply a thin layer twice a day to a clean dry face. 03/11/21   Deeann Saint, MD  Multiple Vitamin (MULTIVITAMIN) capsule Take 1 capsule by mouth daily.     [provider]  olmesartan (BENICAR) 20 MG tablet TAKE 1 TABLET BY MOUTH EVERY DAY 12/12/22   [place], Daryl G, MD  oxybutynin (DITROPAN-XL) 10 MG 24 hr tablet Take 10 mg by mouth daily. 06/29/22   [provider]  pantoprazole (PROTONIX) 20 MG tablet Take 1 tablet (20 mg total) by mouth daily. 06/01/21   [place], Pattye G, MD  triamcinolone cream (KENALOG) 0.5 % Apply 1 Application topically 3 (three) times daily. 05/11/23   Terressa Koyanagi, DO      Allergies    Patient has no known allergies.    Review of Systems   Review of Systems  All other systems reviewed and are negative.   Physical Exam Updated Vital Signs BP (!) 148/51 (BP Location: Right Arm)   Pulse 71   Temp 98.6 F (37 C) (Oral)   Resp 18   Ht 5\' 7"  (1.702 m)   Wt 75.3 kg   SpO2 93%   BMI 26.00 kg/m  Physical Exam Vitals and nursing note reviewed.   83 year old female, resting comfortably and in no acute distress. Vital signs are  significant for elevated systolic blood pressure. Oxygen saturation is 94%, which is normal. Head is normocephalic.  Hematoma present in the left frontoparietal area. PERRLA, EOMI.  Neck is nontender. Back is nontender. Lungs are clear without rales, wheezes, or rhonchi. Chest is nontender. Heart has regular rate and rhythm without murmur. Abdomen is soft, flat, nontender. Extremities: Hematoma present anterior aspect of the left lower leg just distal to the knee.  This is tender.  Full range of motion of all other joints without pain. Skin is warm and dry without rash. Neurologic: Awake and alert, cranial nerves are intact, moves all extremities equally.     ED Results / Procedures / Treatments    Radiology CT Head Wo Contrast Result Date: 06/24/2023 CLINICAL DATA:  Unwitnessed fall hitting head on door frame. EXAM: CT HEAD WITHOUT CONTRAST CT CERVICAL SPINE WITHOUT CONTRAST TECHNIQUE: Multidetector CT imaging of the head and cervical spine was performed following the standard protocol without intravenous contrast. Multiplanar CT image reconstructions of the cervical spine were also generated. RADIATION DOSE REDUCTION: This exam was performed according to the departmental dose-optimization program which includes automated exposure control, adjustment of the mA and/or kV according to patient size and/or use of iterative reconstruction technique. COMPARISON:  04/12/2022 FINDINGS: CT HEAD FINDINGS Brain: No evidence of acute infarction, hemorrhage, hydrocephalus, extra-axial collection or mass lesion/mass effect. Prominence of the sulci and ventricles compatible with age related brain atrophy. Vascular: No hyperdense vessel or unexpected calcification. Skull: Normal. Negative for fracture or focal lesion. Sinuses/Orbits: Paranasal sinuses and mastoid air cells are clear. Orbits appear intact. Other: Large left frontotemporal scalp hematoma measures 9.3 x 2.0 cm, image 23/3. CT CERVICAL SPINE  FINDINGS Alignment: Normal. Skull base and vertebrae: No acute fracture. No primary bone lesion or focal pathologic process. Soft tissues and spinal canal: No prevertebral fluid or swelling. No visible canal hematoma. Disc levels: Multilevel disc space narrowing and endplate spurring. Most severe at C5-6 and C6-7. Bilateral facet arthropathy. Upper chest: Negative. Other: None IMPRESSION: 1. No acute intracranial abnormalities. 2. Large left frontotemporal scalp hematoma. 3. No evidence for cervical spine fracture or subluxation. Electronically Signed   By: Signa Kell M.D.   On: 06/24/2023 05:05   CT Cervical Spine Wo Contrast Result Date: 06/24/2023 CLINICAL DATA:  Unwitnessed fall hitting head on door frame. EXAM: CT HEAD WITHOUT CONTRAST CT CERVICAL SPINE WITHOUT CONTRAST TECHNIQUE: Multidetector CT imaging of the head and cervical spine was performed following the standard protocol without intravenous contrast. Multiplanar CT image reconstructions of the cervical spine were also generated. RADIATION DOSE REDUCTION: This exam was performed according  to the departmental dose-optimization program which includes automated exposure control, adjustment of the mA and/or kV according to patient size and/or use of iterative reconstruction technique. COMPARISON:  04/12/2022 FINDINGS: CT HEAD FINDINGS Brain: No evidence of acute infarction, hemorrhage, hydrocephalus, extra-axial collection or mass lesion/mass effect. Prominence of the sulci and ventricles compatible with age related brain atrophy. Vascular: No hyperdense vessel or unexpected calcification. Skull: Normal. Negative for fracture or focal lesion. Sinuses/Orbits: Paranasal sinuses and mastoid air cells are clear. Orbits appear intact. Other: Large left frontotemporal scalp hematoma measures 9.3 x 2.0 cm, image 23/3. CT CERVICAL SPINE FINDINGS Alignment: Normal. Skull base and vertebrae: No acute fracture. No primary bone lesion or focal pathologic  process. Soft tissues and spinal canal: No prevertebral fluid or swelling. No visible canal hematoma. Disc levels: Multilevel disc space narrowing and endplate spurring. Most severe at C5-6 and C6-7. Bilateral facet arthropathy. Upper chest: Negative. Other: None IMPRESSION: 1. No acute intracranial abnormalities. 2. Large left frontotemporal scalp hematoma. 3. No evidence for cervical spine fracture or subluxation. Electronically Signed   By: Signa Kell M.D.   On: 06/24/2023 05:05   DG Tibia/Fibula Left Result Date: 06/24/2023 CLINICAL DATA:  Unwitnessed fall EXAM: LEFT TIBIA AND FIBULA - 2 VIEW COMPARISON:  None Available. FINDINGS: Soft tissue swelling to the anterior and upper tibia without opaque foreign body or fracture. No dislocation. IMPRESSION: Soft tissue swelling without fracture. Electronically Signed   By: Tiburcio Pea M.D.   On: 06/24/2023 05:00    Procedures Procedures    Medications Ordered in ED Medications  acetaminophen (TYLENOL) tablet 650 mg (650 mg Oral Given 06/24/23 0533)    ED Course/ Medical Decision Making/ A&P                                 Medical Decision Making Amount and/or Complexity of Data Reviewed Radiology: ordered.  Risk OTC drugs.   Fall inpatient who is anticoagulated.  I have ordered CT of head and cervical spine.  Also, I have reviewed her old records and note office visit on 06/21/2023 where she was evaluated for hematoma of her left lower leg but x-ray was not obtained.  Therefore I am ordering x-rays of the left lower leg as well.  CT scans show no intracranial abnormality or spinal fracture but there is a large left frontotemporal scalp hematoma consistent with exam.  X-rays of tibia/fibula on the left shows soft tissue swelling without fracture.  I have independently viewed all the images, and agree with radiologist's interpretation.  I am discharging her with instructions to apply ice.  On further review of past records, I note that  on office visit on 09/03/2021 her primary care provider stated she was receiving anticoagulation for pulmonary embolism.  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  Since then, she has had 6 ED visits for fall.  I am recommending that she discuss with her primary care provider whether the risks of anticoagulation outweighed the benefit.  Final Clinical Impression(s) / ED Diagnoses Final diagnoses:  Fall at home, initial encounter  Contusion of left temporofrontal scalp, initial encounter  Chronic anticoagulation    Rx / DC Orders ED Discharge Orders     None         Dione Booze, MD 06/24/23 [phone]

## 2023-06-24 NOTE — Discharge Instructions (Signed)
 Apply ice to help reduce swelling and help with pain.  Ice should be applied for 30 minutes at a time, 4 times a day.  You may take acetaminophen as needed for pain.  Do not take ibuprofen or naproxen because they increase your risk of bleeding while you are taking a blood thinner.  I am concerned about your frequent falls and the risk of serious injury while you are taking blood thinners.  Please discuss with your primary care provider whether the benefit of taking the blood thinner outweighs the risk.

## 2023-06-26 ENCOUNTER — Telehealth: Payer: Self-pay

## 2023-06-26 NOTE — Transitions of Care (Post Inpatient/ED Visit) (Signed)
 06/26/2023  Name: Alexa Brown MRN: [id number] DOB: 22-Mar-1941  Today's TOC FU Call Status: Today's TOC FU Call Status:: Successful TOC FU Call Completed TOC FU Call Complete Date: 06/26/23 Patient's Name and Date of Birth confirmed.  Transition Care Management Follow-up Telephone Call Date of Discharge: 06/24/23 Discharge Facility: [facility]) Type of Discharge: Emergency Department Reason for ED Visit: Other: (fall) How have you been since you were released from the hospital?: Better Any questions or concerns?: No  Items Reviewed: Did you receive and understand the discharge instructions provided?: Yes Medications obtained,verified, and reconciled?: Yes (Medications Reviewed) Any new allergies since your discharge?: No Dietary orders reviewed?: Yes Do you have support at home?: Yes People in Home: child(ren), adult  Medications Reviewed Today: Medications Reviewed Today     Reviewed by Karena Addison, LPN (Licensed Practical Nurse) on 06/26/23 at 1624  Med List Status: <None>   Medication Order Taking? Sig Documenting Provider Last Dose Status Informant  Accu-Chek Softclix Lancets lancets [id number] No Use to check blood sugars 1-2 times daily. Dx:E11.65 [place], Denora G, MD Taking Active   acetaminophen (TYLENOL) 325 MG tablet [id number] No Take 650 mg by mouth every 6 (six) hours as needed for mild pain, fever or headache. [provider] Taking Active Child  allopurinol (ZYLOPRIM) 100 MG tablet [id number] No TAKE 1 TABLET BY MOUTH TWICE A DAY [place], Nga G, MD Taking Active   Alpha-Lipoic Acid 600 MG TABS [id number] No Take 600 mg by mouth daily. [provider] Taking Active Child  b complex vitamins capsule [id number] No Take 1 capsule by mouth daily. [provider] Taking Active   Blood Glucose Monitoring Suppl (ACCU-CHEK GUIDE) w/Device KIT [id number] No Use to check blood sugars 1-2 times daily. Dx:E11.65 [place], Rilynn G, MD  Taking Active   buPROPion ER [facility] SR) 100 MG 12 hr tablet [id number] No TAKE 1 TABLET BY MOUTH TWICE A DAY [place], Tabria G, MD Taking Active   calcium carbonate (CALCIUM 600) 600 MG TABS tablet [id number] No Take 600 mg by mouth daily. [provider] Taking Active   DULoxetine (CYMBALTA) 60 MG capsule [id number] No TAKE 1 CAPSULE BY MOUTH EVERY DAY [place], Mercer G, MD Taking Active   ELIQUIS 5 MG TABS tablet [id number] No TAKE 1 TABLET BY MOUTH TWICE A DAY [place], Jihan G, MD Taking Active   ezetimibe (ZETIA) 10 MG tablet [id number] No TAKE 1 TABLET BY MOUTH EVERY DAY [place], Aniesa G, MD Taking Active   glucose blood (ACCU-CHEK GUIDE) test strip [id number] No Use to test blood sugars 1-2 times daily. Dx:e11.65 [place], Dorrene G, MD Taking Active   Homeopathic Products [facility] RELIEF EX) [id number] No Apply 1 application topically daily as needed (foot cramps). [provider] Taking Active Child  ketoconazole (NIZORAL) 2 % cream [id number] No SMARTSIG:1 Application Topical 1 to 2 Times Daily [provider] Taking Active   L-Lysine 500 MG TABS [id number] No Take 1 tablet by mouth daily. [provider] Taking Active   Lancet Devices [facility]) lancets [id number] No Use to test blood sugar once a day. [place], Layza G, MD Taking Active Child  LIVALO 2 MG TABS [id number] No TAKE 1 TABLET BY MOUTH EVERY DAY [place], Aleiyah G, MD Taking Active   metroNIDAZOLE (METROCREAM) 0.75 % cream [id number] No Apply topically 2 (two) times daily. Apply a thin layer twice a day to a clean dry face. Deeann Saint, MD Taking Active   Multiple Vitamin (MULTIVITAMIN) capsule [id number]  No Take 1 capsule by mouth daily.  [provider] Taking Active Child  olmesartan (BENICAR) 20 MG tablet [id number] No TAKE 1 TABLET BY MOUTH EVERY DAY [place], Kathia G, MD Taking Active   oxybutynin (DITROPAN-XL) 10 MG 24 hr tablet [id number] No Take 10 mg by mouth daily. [provider] Taking Active   pantoprazole (PROTONIX) 20 MG tablet [id number] No Take 1 tablet (20 mg total) by mouth daily. [place], Dura G, MD Taking Active   triamcinolone cream (KENALOG) 0.5 % [id number] No Apply 1 Application topically 3 (three) times daily. Terressa Koyanagi, DO Taking Active             Home Care and Equipment/Supplies: Were Home Health Services Ordered?: NA Any new equipment or medical supplies ordered?: NA  Functional Questionnaire: Do you need assistance with bathing/showering or dressing?: No Do you need assistance with meal preparation?: No Do you need assistance with eating?: No Do you have difficulty maintaining continence: No Do you need assistance with getting out of bed/getting out of a chair/moving?: No Do you have difficulty managing or taking your medications?: No  Follow up appointments reviewed: PCP Follow-up appointment confirmed?: NA Specialist Hospital Follow-up appointment confirmed?: NA Do you need transportation to your follow-up appointment?: No Do you understand care options if your condition(s) worsen?: Yes-patient verbalized understanding    SIGNATURE Karena Addison, LPN [facility] Nurse Health Advisor Direct Dial [phone]

## 2023-06-29 ENCOUNTER — Other Ambulatory Visit: Payer: Medicare Other

## 2023-07-04 ENCOUNTER — Encounter: Payer: Self-pay | Admitting: Podiatry

## 2023-07-04 ENCOUNTER — Ambulatory Visit: Admitting: Podiatry

## 2023-07-04 DIAGNOSIS — M79674 Pain in right toe(s): Secondary | ICD-10-CM

## 2023-07-04 DIAGNOSIS — B351 Tinea unguium: Secondary | ICD-10-CM | POA: Diagnosis not present

## 2023-07-04 DIAGNOSIS — M79675 Pain in left toe(s): Secondary | ICD-10-CM

## 2023-07-04 DIAGNOSIS — E1142 Type 2 diabetes mellitus with diabetic polyneuropathy: Secondary | ICD-10-CM | POA: Diagnosis not present

## 2023-07-04 DIAGNOSIS — L84 Corns and callosities: Secondary | ICD-10-CM

## 2023-07-04 NOTE — Progress Notes (Signed)
  Subjective:  Patient ID: Alexa Brown, female    DOB: Apr 26, 1940,  MRN: [id number]  83 y.o. female presents at risk foot care with history of diabetic neuropathy and callus(es) right lower extremity and painful mycotic toenails that are difficult to trim. Painful toenails interfere with ambulation. Aggravating factors include wearing enclosed shoe gear. Pain is relieved with periodic professional debridement. Painful calluses are aggravated when weightbearing with and without shoegear. Pain is relieved with periodic professional debridement. She is recovering from a fall sustained at the end of March. Her daughter is present during today's visit.  Chief Complaint  Patient presents with   Nail Problem    Patient states her PCP name is Dr. [place] Brown and the last time she had a visit with her was November of 2024     New problem(s): None   PCP is Alexa, Alexa G, MD.  No Known Allergies  Review of Systems: Negative except as noted in the HPI.   Objective:  Alexa Brown is a pleasant 83 y.o. female WD, WN in NAD. AAO x 3.  Vascular Examination: Vascular status intact b/l with palpable pedal pulses. CFT immediate b/l. Pedal hair present. No edema. No pain with calf compression b/l. Skin temperature gradient WNL b/l. No varicosities noted. No cyanosis or clubbing noted.  Neurological Examination: Pt has subjective symptoms of neuropathy. Sensation grossly intact b/l with 10 gram monofilament. Vibratory sensation intact b/l.  Dermatological Examination: Pedal skin with normal turgor, texture and tone b/l. No open wounds nor interdigital macerations noted. Toenails 1-5 b/l thick, discolored, elongated with subungual debris and pain on dorsal palpation. Hyperkeratotic lesion(s) right great toe.  No erythema, no edema, no drainage, no fluctuance.  Musculoskeletal Examination: Muscle strength 5/5 to b/l LE.  No pain, crepitus noted b/l. HAV with bunion deformity noted b/l  LE.  Radiographs: None  Last A1c:      Latest Ref Rng & Units 01/02/2023    9:04 AM  Hemoglobin A1C  Hemoglobin-A1c 4.6 - 6.5 % 6.3    Assessment:   1. Pain due to onychomycosis of toenails of both feet   2. Callus   3. Diabetic peripheral neuropathy associated with type 2 diabetes mellitus (HCC)    Plan:  Consent given for treatment. Diabetic foot examination performed. All patient's and/or POA's questions/concerns addressed on today's visit. Mycotic toenails 1-5 debrided in length and girth without incident. Callus(es) right great toe pared with sharp debridement without incident. Continue foot and shoe inspections daily. Monitor blood glucose per PCP/Endocrinologist's recommendations.Continue soft, supportive shoe gear daily. Report any pedal injuries to medical professional. Call office if there are any quesitons/concerns. -Patient/POA to call should there be question/concern in the interim.  Return in about 3 months (around 10/03/2023).  Alexa Brown, DPM      Alexa Brown LOCATION: [address], [place] [address]                   Office [phone]   [facility] LOCATION: [address] [place], [place] [address] Office [phone]

## 2023-07-06 ENCOUNTER — Other Ambulatory Visit: Payer: Self-pay | Admitting: Family Medicine

## 2023-07-06 ENCOUNTER — Ambulatory Visit
Admission: RE | Admit: 2023-07-06 | Discharge: 2023-07-06 | Disposition: A | Discharge status: Home / Self Care | Payer: Medicare Other | Source: Ambulatory Visit | Attending: Family Medicine | Admitting: Family Medicine

## 2023-07-06 DIAGNOSIS — N6310 Unspecified lump in the right breast, unspecified quadrant: Secondary | ICD-10-CM

## 2023-07-06 DIAGNOSIS — R928 Other abnormal and inconclusive findings on diagnostic imaging of breast: Secondary | ICD-10-CM

## 2023-07-06 DIAGNOSIS — N6314 Unspecified lump in the right breast, lower inner quadrant: Secondary | ICD-10-CM | POA: Diagnosis not present

## 2023-07-20 ENCOUNTER — Emergency Department (HOSPITAL_COMMUNITY)
Admission: EM | Admit: 2023-07-20 | Discharge: 2023-07-21 | Disposition: A | Discharge status: Home / Self Care | Attending: Emergency Medicine | Admitting: Emergency Medicine

## 2023-07-20 ENCOUNTER — Emergency Department (HOSPITAL_COMMUNITY)

## 2023-07-20 ENCOUNTER — Other Ambulatory Visit: Payer: Self-pay

## 2023-07-20 ENCOUNTER — Encounter (HOSPITAL_COMMUNITY): Payer: Self-pay | Admitting: Emergency Medicine

## 2023-07-20 DIAGNOSIS — S0101XA Laceration without foreign body of scalp, initial encounter: Secondary | ICD-10-CM | POA: Diagnosis not present

## 2023-07-20 DIAGNOSIS — Y92009 Unspecified place in unspecified non-institutional (private) residence as the place of occurrence of the external cause: Secondary | ICD-10-CM | POA: Insufficient documentation

## 2023-07-20 DIAGNOSIS — S161XXA Strain of muscle, fascia and tendon at neck level, initial encounter: Secondary | ICD-10-CM | POA: Insufficient documentation

## 2023-07-20 DIAGNOSIS — I10 Essential (primary) hypertension: Secondary | ICD-10-CM | POA: Diagnosis not present

## 2023-07-20 DIAGNOSIS — S060X0A Concussion without loss of consciousness, initial encounter: Secondary | ICD-10-CM | POA: Insufficient documentation

## 2023-07-20 DIAGNOSIS — Z7901 Long term (current) use of anticoagulants: Secondary | ICD-10-CM | POA: Diagnosis not present

## 2023-07-20 DIAGNOSIS — I6782 Cerebral ischemia: Secondary | ICD-10-CM | POA: Diagnosis not present

## 2023-07-20 DIAGNOSIS — Z79899 Other long term (current) drug therapy: Secondary | ICD-10-CM | POA: Diagnosis not present

## 2023-07-20 DIAGNOSIS — E119 Type 2 diabetes mellitus without complications: Secondary | ICD-10-CM | POA: Diagnosis not present

## 2023-07-20 DIAGNOSIS — W01198A Fall on same level from slipping, tripping and stumbling with subsequent striking against other object, initial encounter: Secondary | ICD-10-CM | POA: Diagnosis not present

## 2023-07-20 DIAGNOSIS — G319 Degenerative disease of nervous system, unspecified: Secondary | ICD-10-CM | POA: Diagnosis not present

## 2023-07-20 DIAGNOSIS — S0990XA Unspecified injury of head, initial encounter: Secondary | ICD-10-CM | POA: Diagnosis not present

## 2023-07-20 DIAGNOSIS — S199XXA Unspecified injury of neck, initial encounter: Secondary | ICD-10-CM | POA: Diagnosis not present

## 2023-07-20 LAB — CBG MONITORING, ED: Glucose-Capillary: 125 mg/dL — ABNORMAL HIGH (ref 70–99)

## 2023-07-20 LAB — BASIC METABOLIC PANEL WITH GFR
Anion gap: 7 (ref 5–15)
BUN: 18 mg/dL (ref 8–23)
CO2: 26 mmol/L (ref 22–32)
Calcium: 9.9 mg/dL (ref 8.9–10.3)
Chloride: 107 mmol/L (ref 98–111)
Creatinine, Ser: 1.11 mg/dL — ABNORMAL HIGH (ref 0.44–1.00)
GFR, Estimated: 50 mL/min — ABNORMAL LOW (ref >60)
Glucose, Bld: 129 mg/dL — ABNORMAL HIGH (ref 70–99)
Potassium: 4 mmol/L (ref 3.5–5.1)
Sodium: 140 mmol/L (ref 135–145)

## 2023-07-20 LAB — CBC
HCT: 40.3 % (ref 36.0–46.0)
Hemoglobin: 13.2 g/dL (ref 12.0–15.0)
MCH: 30.7 pg (ref 26.0–34.0)
MCHC: 32.8 g/dL (ref 30.0–36.0)
MCV: 93.7 fL (ref 80.0–100.0)
Platelets: 242 10*3/uL (ref 150–400)
RBC: 4.3 MIL/uL (ref 3.87–5.11)
RDW: 12.8 % (ref 11.5–15.5)
WBC: 5.4 10*3/uL (ref 4.0–10.5)
nRBC: 0 % (ref 0.0–0.2)

## 2023-07-20 MED ORDER — LIDOCAINE-EPINEPHRINE-TETRACAINE (LET) TOPICAL GEL
3.0000 mL | Freq: Once | TOPICAL | Status: AC
  Administered 2023-07-21: 3 mL via TOPICAL
  Filled 2023-07-20: qty 3

## 2023-07-20 NOTE — ED Provider Notes (Signed)
 [place] EMERGENCY DEPARTMENT AT [facility] Provider Note   CSN: [id number] Arrival date & time: 07/20/23  2228     History {Add pertinent medical, surgical, social history, OB history to HPI:1} Chief Complaint  Patient presents with   Fall on Eliquis    Head Injury    Alexa Brown is a 83 y.o. female.  The history is provided by the patient and a relative.  Patient with history of hypertension, hyperlipidemia, CVA, on Eliquis  presents after a fall.  Patient lives with daughter.  Daughter reports that patient got up quickly out of bed and was moving too fast and fell hitting her head.  No LOC.  Patient denies any complaints.  She does have a laceration to the left side of her head and a small abrasion to her left wrist  Patient has otherwise been at her baseline. No acute issues.  No other falls in the past week.  Has PCP follow-up tomorrow    Past Medical History:  Diagnosis Date   Chicken pox    Depression    Diabetes mellitus without complication (HCC)    GERD (gastroesophageal reflux disease)    Hyperlipidemia    Hypertension    Stroke [facility])    UTI (urinary tract infection)     Home Medications Prior to Admission medications   Medication Sig Start Date End Date Taking? Authorizing Provider  Accu-Chek Softclix Lancets lancets Use to check blood sugars 1-2 times daily. Dx:E11.65 10/18/21   [place], Thamar G, MD  acetaminophen  (TYLENOL ) 325 MG tablet Take 650 mg by mouth every 6 (six) hours as needed for mild pain, fever or headache.    [provider]  allopurinol  (ZYLOPRIM ) 100 MG tablet TAKE 1 TABLET BY MOUTH TWICE A DAY 02/15/23   [place], Falana G, MD  Alpha-Lipoic Acid 600 MG TABS Take 600 mg by mouth daily.    [provider]  b complex vitamins capsule Take 1 capsule by mouth daily.    [provider]  Blood Glucose Monitoring Suppl (ACCU-CHEK GUIDE) w/Device KIT Use to check blood sugars 1-2 times daily. Dx:E11.65  10/18/21   [place], Marjarie G, MD  buPROPion  ER (WELLBUTRIN  SR) 100 MG 12 hr tablet TAKE 1 TABLET BY MOUTH TWICE A DAY 05/16/23   [place], Selene G, MD  calcium  carbonate (CALCIUM  600) 600 MG TABS tablet Take 600 mg by mouth daily.    [provider]  DULoxetine  (CYMBALTA ) 60 MG capsule TAKE 1 CAPSULE BY MOUTH EVERY DAY 05/16/23   [place], Nadeen G, MD  ELIQUIS  5 MG TABS tablet TAKE 1 TABLET BY MOUTH TWICE A DAY 07/06/23   [place], Tanza G, MD  ezetimibe  (ZETIA ) 10 MG tablet TAKE 1 TABLET BY MOUTH EVERY DAY 12/12/22   [place], Rainelle G, MD  glucose blood (ACCU-CHEK GUIDE) test strip Use to test blood sugars 1-2 times daily. Dx:e11.65 10/18/21   [place], Gerilynn G, MD  Homeopathic Products [facility] RELIEF EX) Apply 1 application topically daily as needed (foot cramps).    [provider]  ketoconazole  (NIZORAL ) 2 % cream SMARTSIG:1 Application Topical 1 to 2 Times Daily 11/06/20   [provider]  L-Lysine  500 MG TABS Take 1 tablet by mouth daily.    [provider]  Lancet Devices [facility]) lancets Use to test blood sugar once a day. 03/31/16   [place], Tynleigh G, MD  LIVALO  2 MG TABS TAKE 1 TABLET BY MOUTH EVERY DAY 12/12/22   [place], Yehudit G, MD  metroNIDAZOLE  (METROCREAM ) 0.75 % cream Apply topically 2 (two) times daily. Apply a thin layer twice a day to a clean dry face. 03/11/21   Viola Greulich, MD  Multiple Vitamin (MULTIVITAMIN) capsule Take 1 capsule by mouth daily.     [provider]  olmesartan  (BENICAR ) 20 MG tablet TAKE 1 TABLET BY MOUTH EVERY DAY 12/12/22   [place], Rendy G, MD  oxybutynin (DITROPAN-XL) 10 MG 24 hr tablet Take 10 mg by mouth daily. 06/29/22   [provider]  pantoprazole  (PROTONIX ) 20 MG tablet Take 1 tablet (20 mg total) by mouth daily. 06/01/21   [place], Satoria G, MD  triamcinolone  cream (KENALOG ) 0.5 % Apply 1 Application topically 3 (three) times daily. 05/11/23   Maurie Southern, DO      Allergies    Patient has no known  allergies.    Review of Systems   Review of Systems  Musculoskeletal:  Negative for back pain and neck pain.  Skin:  Positive for wound.    Physical Exam Updated Vital Signs BP (!) 156/76   Pulse 85   Temp 98.4 F (36.9 C)   Resp 18   Wt 75.3 kg   SpO2 98%   BMI 26.00 kg/m  Physical Exam CONSTITUTIONAL: Elderly, no acute distress HEAD: Laceration noted to left side of her forehead EYES: EOMI/PERRL ENMT: Mucous membranes moist, no dental nasal trauma SPINE/BACK:entire spine nontender No bruising/crepitance/stepoffs noted to spine CV: S1/S2 noted, no murmurs/rubs/gallops noted LUNGS: Lungs are clear to auscultation bilaterally, no apparent distress ABDOMEN: soft, nontender NEURO: Pt is awake/alert/appropriate, moves all extremitiesx4.  No facial droop.   EXTREMITIES: pulses normal/equal, full ROM Small abrasion noted to the left wrist, no tenderness Pelvis stable Old bruise noted to below the left knee that is healing well All other extremities/joints palpated/ranged and nontender SKIN: warm, color normal PSYCH: no abnormalities of mood noted, alert and oriented to situation  ED Results / Procedures / Treatments   Labs (all labs ordered are listed, but only abnormal results are displayed) Labs Reviewed  CBG MONITORING, ED - Abnormal; Notable for the following components:      Result Value   Glucose-Capillary 125 (*)    All other components within normal limits  BASIC METABOLIC PANEL WITH GFR  CBC  URINALYSIS, ROUTINE W REFLEX MICROSCOPIC    EKG ED ECG REPORT   Date: 07/20/2023 2250  Rate: 64  Rhythm: normal sinus rhythm  QRS Axis: normal  Intervals: normal  ST/T Wave abnormalities: normal  Conduction Disutrbances:none    I have personally reviewed the EKG tracing and agree with the computerized printout as noted.   Radiology No results found.  Procedures Procedures  {Document cardiac monitor, telemetry assessment procedure when  appropriate:1}  Medications Ordered in ED Medications - No data to display  ED Course/ Medical Decision Making/ A&P   {   Click here for ABCD2, HEART and other calculatorsREFRESH Note before signing :1}                              Medical Decision Making Amount and/or Complexity of Data Reviewed Labs: ordered. Radiology: ordered.   This patient presents to the ED for concern of head trauma, this involves an extensive number of treatment options, and is a complaint that carries with it a high risk of complications and morbidity.  The differential diagnosis includes but is not limited to subdural hematoma, subarachnoid hemorrhage, skull fracture, concussion  Comorbidities that complicate the patient evaluation: Patient's presentation is complicated by their history of CVA  Social Determinants of Health: Patient's  poor mobility   increases the complexity of managing their presentation  Additional history obtained: Additional history obtained from family Records reviewed Primary Care Documents  Lab Tests: I Ordered, and personally interpreted labs.  The pertinent results include:  ***  Imaging Studies ordered: I ordered imaging studies including CT scan head and C-spine   I independently visualized and interpreted imaging which showed *** I agree with the radiologist interpretation  Cardiac Monitoring: The patient was maintained on a cardiac monitor.  I personally viewed and interpreted the cardiac monitor which showed an underlying rhythm of:  {cardiac monitor:26849}  Medicines ordered and prescription drug management: I ordered medication including ***  for ***  Reevaluation of the patient after these medicines showed that the patient    {resolved/improved/worsened:23923::"improved"}  Test Considered: Patient is low risk / negative by ***, therefore do not feel that *** is indicated.  Critical Interventions:  ***  Consultations Obtained: I requested consultation  with the {consultation:26851}, and discussed  findings as well as pertinent plan - they recommend: ***  Reevaluation: After the interventions noted above, I reevaluated the patient and found that they have :{resolved/improved/worsened:23923::"improved"}  Complexity of problems addressed: Patient's presentation is most consistent with  {QMVH:84696}  Disposition: After consideration of the diagnostic results and the patient's response to treatment,  I feel that the patent would benefit from {disposition:26850}.     {Document critical care time when appropriate:1} {Document review of labs and clinical decision tools ie heart score, Chads2Vasc2 etc:1}  {Document your independent review of radiology images, and any outside records:1} {Document your discussion with family members, caretakers, and with consultants:1} {Document social determinants of health affecting pt's care:1} {Document your decision making why or why not admission, treatments were needed:1} Final Clinical Impression(s) / ED Diagnoses Final diagnoses:  None    Rx / DC Orders ED Discharge Orders     None

## 2023-07-20 NOTE — ED Triage Notes (Addendum)
 Pt in from home after trip fall - takes Eliquis , arrives with 2in lac to L forehead. Quick-clot bandage applied from home, bleeding controlled on ED arrival. Pt states no LOC, denies any neck pain. Pt has small skin tear to L wrist, bleeding controlled without gauze. A&ox4

## 2023-07-21 ENCOUNTER — Ambulatory Visit: Admitting: Family Medicine

## 2023-07-21 DIAGNOSIS — G319 Degenerative disease of nervous system, unspecified: Secondary | ICD-10-CM | POA: Diagnosis not present

## 2023-07-21 DIAGNOSIS — S060X0A Concussion without loss of consciousness, initial encounter: Secondary | ICD-10-CM | POA: Diagnosis not present

## 2023-07-21 DIAGNOSIS — S0990XA Unspecified injury of head, initial encounter: Secondary | ICD-10-CM | POA: Diagnosis not present

## 2023-07-21 DIAGNOSIS — I6782 Cerebral ischemia: Secondary | ICD-10-CM | POA: Diagnosis not present

## 2023-07-21 DIAGNOSIS — S199XXA Unspecified injury of neck, initial encounter: Secondary | ICD-10-CM | POA: Diagnosis not present

## 2023-07-21 MED ORDER — LIDOCAINE-EPINEPHRINE (PF) 2 %-1:200000 IJ SOLN
INTRAMUSCULAR | Status: AC
  Filled 2023-07-21: qty 20

## 2023-07-21 NOTE — ED Notes (Addendum)
 Pt forehead wound bandaged with non adherent dressing and gauze roll pt ambulatory with steady gait. MD notified

## 2023-07-24 ENCOUNTER — Telehealth: Payer: Self-pay

## 2023-07-24 NOTE — Transitions of Care (Post Inpatient/ED Visit) (Signed)
 07/24/2023  Name: Alexa Brown MRN: [id number] DOB: Jun 13, 1940  Today's TOC FU Call Status: Today's TOC FU Call Status:: Successful TOC FU Call Completed TOC FU Call Complete Date: 07/24/23 Patient's Name and Date of Birth confirmed.  Transition Care Management Follow-up Telephone Call Date of Discharge: 07/21/23 Discharge Facility: [facility]) Type of Discharge: Emergency Department Reason for ED Visit: Other: (concussion) How have you been since you were released from the hospital?: Better Any questions or concerns?: No  Items Reviewed: Did you receive and understand the discharge instructions provided?: Yes Medications obtained,verified, and reconciled?: Yes (Medications Reviewed) Any new allergies since your discharge?: No Dietary orders reviewed?: Yes Do you have support at home?: Yes People in Home [RPT]: child(ren), adult  Medications Reviewed Today: Medications Reviewed Today     Reviewed by Darrall Ellison, LPN (Licensed Practical Nurse) on 07/24/23 at 1616  Med List Status: <None>   Medication Order Taking? Sig Documenting Provider Last Dose Status Informant  Accu-Chek Softclix Lancets lancets [id number] No Use to check blood sugars 1-2 times daily. Dx:E11.65 [place], Marcel G, MD Taking Active   acetaminophen  (TYLENOL ) 325 MG tablet [id number] No Take 650 mg by mouth every 6 (six) hours as needed for mild pain, fever or headache. [provider] Taking Active Child  allopurinol  (ZYLOPRIM ) 100 MG tablet [id number] No TAKE 1 TABLET BY MOUTH TWICE A DAY [place], Robyn G, MD Taking Active   Alpha-Lipoic Acid 600 MG TABS [id number] No Take 600 mg by mouth daily. [provider] Taking Active Child  b complex vitamins capsule [id number] No Take 1 capsule by mouth daily. [provider] Taking Active   Blood Glucose Monitoring Suppl (ACCU-CHEK GUIDE) w/Device KIT [id number] No Use to check blood sugars 1-2 times daily. Dx:E11.65 [place],  Kacia G, MD Taking Active   buPROPion  ER (WELLBUTRIN  SR) 100 MG 12 hr tablet [id number] No TAKE 1 TABLET BY MOUTH TWICE A DAY [place], Blakelynn G, MD Taking Active   calcium  carbonate (CALCIUM  600) 600 MG TABS tablet [id number] No Take 600 mg by mouth daily. [provider] Taking Active   DULoxetine  (CYMBALTA ) 60 MG capsule [id number] No TAKE 1 CAPSULE BY MOUTH EVERY DAY [place], Jacalyn G, MD Taking Active   ELIQUIS  5 MG TABS tablet [id number]  TAKE 1 TABLET BY MOUTH TWICE A DAY [place], Elissia G, MD  Active   ezetimibe  (ZETIA ) 10 MG tablet [id number] No TAKE 1 TABLET BY MOUTH EVERY DAY [place], Layal G, MD Taking Active   glucose blood (ACCU-CHEK GUIDE) test strip [id number] No Use to test blood sugars 1-2 times daily. Dx:e11.65 [place], Saya G, MD Taking Active   Homeopathic Products [facility] RELIEF EX) [id number] No Apply 1 application topically daily as needed (foot cramps). [provider] Taking Active Child  ketoconazole  (NIZORAL ) 2 % cream [id number] No SMARTSIG:1 Application Topical 1 to 2 Times Daily [provider] Taking Active   L-Lysine  500 MG TABS [id number] No Take 1 tablet by mouth daily. [provider] Taking Active   Lancet Devices [facility]) lancets [id number] No Use to test blood sugar once a day. [place], Kelcee G, MD Taking Active Child  LIVALO  2 MG TABS [id number] No TAKE 1 TABLET BY MOUTH EVERY DAY [place], Maci G, MD Taking Active   metroNIDAZOLE  (METROCREAM ) 0.75 % cream [id number] No Apply topically 2 (two) times daily. Apply a thin layer twice a day to a clean dry face. Viola Greulich, MD Taking Active   Multiple Vitamin (MULTIVITAMIN) capsule  [id number] No Take 1 capsule by mouth daily.  [provider] Taking Active Child  olmesartan  (BENICAR ) 20 MG tablet [id number] No TAKE 1 TABLET BY MOUTH EVERY DAY [place], Cornie G, MD Taking Active   oxybutynin (DITROPAN-XL) 10 MG 24 hr tablet [id number] No Take 10 mg by mouth daily. [provider] Taking Active   pantoprazole  (PROTONIX ) 20 MG tablet [id number] No Take 1 tablet (20 mg total) by mouth daily. [place], Aslee G, MD Taking Active   triamcinolone  cream (KENALOG ) 0.5 % [id number] No Apply 1 Application topically 3 (three) times daily. Maurie Southern, DO Taking Active             Home Care and Equipment/Supplies: Were Home Health Services Ordered?: NA Any new equipment or medical supplies ordered?: NA  Functional Questionnaire: Do you need assistance with bathing/showering or dressing?: Yes Do you need assistance with meal preparation?: Yes Do you need assistance with eating?: No Do you have difficulty maintaining continence: No Do you need assistance with getting out of bed/getting out of a chair/moving?: No Do you have difficulty managing or taking your medications?: Yes  Follow up appointments reviewed: PCP Follow-up appointment confirmed?: Yes Date of PCP follow-up appointment?: 07/26/23 [facility]-up Provider[facility] [place] [facility] Follow-up appointment confirmed?: NA Do you need transportation to your follow-up appointment?: No Do you understand care options if your condition(s) worsen?: Yes-patient verbalized understanding    SIGNATURE Darrall Ellison, LPN [facility] Nurse Health Advisor Direct Dial [phone]

## 2023-07-25 NOTE — Progress Notes (Signed)
 ACUTE VISIT No chief complaint on file.  HPI: Alexa Brown is a 83 y.o. female with a PMHx significant for DM II, peripheral neuropathy, PE on chronic anticoagulation, CVA, unstable gait, HLD, HTN, GERD, and CKD, who is here today for hospital follow up.   Patient was hospitalized from 4/24 - 4/25 after a fall.   Review of Systems See other pertinent positives and negatives in HPI.  Current Outpatient Medications on File Prior to Visit  Medication Sig Dispense Refill   Accu-Chek Softclix Lancets lancets Use to check blood sugars 1-2 times daily. Dx:E11.65 200 each 12   acetaminophen  (TYLENOL ) 325 MG tablet Take 650 mg by mouth every 6 (six) hours as needed for mild pain, fever or headache.     allopurinol  (ZYLOPRIM ) 100 MG tablet TAKE 1 TABLET BY MOUTH TWICE A DAY 180 tablet 1   Alpha-Lipoic Acid 600 MG TABS Take 600 mg by mouth daily.     b complex vitamins capsule Take 1 capsule by mouth daily.     Blood Glucose Monitoring Suppl (ACCU-CHEK GUIDE) w/Device KIT Use to check blood sugars 1-2 times daily. Dx:E11.65 1 kit 0   buPROPion  ER (WELLBUTRIN  SR) 100 MG 12 hr tablet TAKE 1 TABLET BY MOUTH TWICE A DAY 180 tablet 1   calcium  carbonate (CALCIUM  600) 600 MG TABS tablet Take 600 mg by mouth daily.     DULoxetine  (CYMBALTA ) 60 MG capsule TAKE 1 CAPSULE BY MOUTH EVERY DAY 90 capsule 1   ELIQUIS  5 MG TABS tablet TAKE 1 TABLET BY MOUTH TWICE A DAY 180 tablet 3   ezetimibe  (ZETIA ) 10 MG tablet TAKE 1 TABLET BY MOUTH EVERY DAY 90 tablet 3   glucose blood (ACCU-CHEK GUIDE) test strip Use to test blood sugars 1-2 times daily. Dx:e11.65 200 each 12   Homeopathic Products (THERAWORX RELIEF EX) Apply 1 application topically daily as needed (foot cramps).     ketoconazole  (NIZORAL ) 2 % cream SMARTSIG:1 Application Topical 1 to 2 Times Daily     L-Lysine  500 MG TABS Take 1 tablet by mouth daily.     Lancet Devices (ACCU-CHEK SOFTCLIX) lancets Use to test blood sugar once a day. 100  each 2   LIVALO  2 MG TABS TAKE 1 TABLET BY MOUTH EVERY DAY 90 tablet 3   metroNIDAZOLE  (METROCREAM ) 0.75 % cream Apply topically 2 (two) times daily. Apply a thin layer twice a day to a clean dry face. 45 g 0   Multiple Vitamin (MULTIVITAMIN) capsule Take 1 capsule by mouth daily.      olmesartan  (BENICAR ) 20 MG tablet TAKE 1 TABLET BY MOUTH EVERY DAY 90 tablet 2   oxybutynin (DITROPAN-XL) 10 MG 24 hr tablet Take 10 mg by mouth daily.     pantoprazole  (PROTONIX ) 20 MG tablet Take 1 tablet (20 mg total) by mouth daily. 90 tablet 2   triamcinolone  cream (KENALOG ) 0.5 % Apply 1 Application topically 3 (three) times daily. 30 g 0   No current facility-administered medications on file prior to visit.    Past Medical History:  Diagnosis Date   Chicken pox    Depression    Diabetes mellitus without complication (HCC)    GERD (gastroesophageal reflux disease)    Hyperlipidemia    Hypertension    Stroke [facility])    UTI (urinary tract infection)    No Known Allergies  Social History   Socioeconomic History   Marital status: Widowed    Spouse name: Not on file  Number of children: Not on file   Years of education: Not on file   Highest education level: 12th grade  Occupational History   Occupation: N/A  Tobacco Use   Smoking status: Never   Smokeless tobacco: Never  Vaping Use   Vaping status: Never Used  Substance and Sexual Activity   Alcohol use: Yes    Comment: occassional wine   Drug use: No   Sexual activity: Not Currently  Other Topics Concern   Not on file  Social History Narrative   Pt lives in single story home with her daughter   Has 2 children   Some college education   Retired Copywriter, advertising for [employer]   Social Drivers of Health   Financial Resource Strain: Low Risk  (05/11/2023)   Overall Financial Resource Strain (CARDIA)    Difficulty of Paying Living Expenses: Not hard at all  Food Insecurity: No Food Insecurity (05/11/2023)   Hunger Vital  Sign    Worried About Running Out of Food in the Last Year: Never true    Ran Out of Food in the Last Year: Never true  Transportation Needs: No Transportation Needs (05/11/2023)   PRAPARE - Administrator, Civil Service (Medical): No    Lack of Transportation (Non-Medical): No  Physical Activity: Inactive (05/11/2023)   Exercise Vital Sign    Days of Exercise per Week: 0 days    Minutes of Exercise per Session: 0 min  Stress: No Stress Concern Present (05/11/2023)   [employer] of Occupational Health - Occupational Stress Questionnaire    Feeling of Stress : Not at all  Social Connections: Socially Isolated (05/11/2023)   Social Connection and Isolation Panel [NHANES]    Frequency of Communication with Friends and Family: More than three times a week    Frequency of Social Gatherings with Friends and Family: More than three times a week    Attends Religious Services: Never    Database administrator or Organizations: No    Attends Banker Meetings: Never    Marital Status: Widowed    There were no vitals filed for this visit. There is no height or weight on file to calculate BMI.  Physical Exam  ASSESSMENT AND PLAN:  Alexa Brown was seen today for hospital follow up.   There are no diagnoses linked to this encounter.  No follow-ups on file.  I, Fritz Jewel Wierda, acting as a scribe for Jaxin Fulfer [place], MD., have documented all relevant documentation on the behalf of Alexa Levett [place], MD, as directed by  Zygmund Passero [place], MD while in the presence of Kayela Humphres [place], MD.   I, Carmichael Burdette [place], MD, have reviewed all documentation for this visit. The documentation on 07/25/23 for the exam, diagnosis, procedures, and orders are all accurate and complete.  Quanisha Drewry G. [place], MD  [facility]. [place] office.  Discharge Instructions   None

## 2023-07-26 ENCOUNTER — Ambulatory Visit (INDEPENDENT_AMBULATORY_CARE_PROVIDER_SITE_OTHER): Admitting: Family Medicine

## 2023-07-26 ENCOUNTER — Encounter: Payer: Self-pay | Admitting: Family Medicine

## 2023-07-26 VITALS — BP 140/70 | HR 88 | Temp 98.2°F | Resp 16 | Ht 67.0 in | Wt 166.0 lb

## 2023-07-26 DIAGNOSIS — R296 Repeated falls: Secondary | ICD-10-CM | POA: Diagnosis not present

## 2023-07-26 DIAGNOSIS — S0181XD Laceration without foreign body of other part of head, subsequent encounter: Secondary | ICD-10-CM | POA: Diagnosis not present

## 2023-07-26 DIAGNOSIS — I63429 Cerebral infarction due to embolism of unspecified anterior cerebral artery: Secondary | ICD-10-CM

## 2023-07-26 DIAGNOSIS — Z8673 Personal history of transient ischemic attack (TIA), and cerebral infarction without residual deficits: Secondary | ICD-10-CM

## 2023-07-26 DIAGNOSIS — N1831 Chronic kidney disease, stage 3a: Secondary | ICD-10-CM | POA: Diagnosis not present

## 2023-07-26 DIAGNOSIS — I674 Hypertensive encephalopathy: Secondary | ICD-10-CM

## 2023-07-26 DIAGNOSIS — E1149 Type 2 diabetes mellitus with other diabetic neurological complication: Secondary | ICD-10-CM

## 2023-07-26 DIAGNOSIS — G629 Polyneuropathy, unspecified: Secondary | ICD-10-CM

## 2023-07-26 DIAGNOSIS — R4189 Other symptoms and signs involving cognitive functions and awareness: Secondary | ICD-10-CM

## 2023-07-26 DIAGNOSIS — F3341 Major depressive disorder, recurrent, in partial remission: Secondary | ICD-10-CM

## 2023-07-26 DIAGNOSIS — Z86711 Personal history of pulmonary embolism: Secondary | ICD-10-CM

## 2023-07-26 LAB — POCT GLYCOSYLATED HEMOGLOBIN (HGB A1C): HbA1c, POC (prediabetic range): 6 % (ref 5.7–6.4)

## 2023-07-26 MED ORDER — DULOXETINE HCL 30 MG PO CPEP: 30.0000 mg | ORAL_CAPSULE | Freq: Every day | ORAL | 1 refills | Status: DC

## 2023-07-26 MED ORDER — CLOPIDOGREL BISULFATE 75 MG PO TABS: 75.0000 mg | ORAL_TABLET | Freq: Every day | ORAL | 2 refills | Status: DC

## 2023-07-26 NOTE — Assessment & Plan Note (Signed)
 Minimal residual deficit. Plavix  75 mg daily resumed today. We discussed some side effects. She is also on Livalo  and Zetia .

## 2023-07-26 NOTE — Assessment & Plan Note (Addendum)
 Problem has been adequately controlled with no pharmacologic treatment. Her Hg A1c today was 6.0. Continue appropriate foot care and periodic eye exam.

## 2023-07-26 NOTE — Assessment & Plan Note (Signed)
 Fall precautions discussed. A few of her chronic co-morbilities and medications can increase the risk for falls. We reviewed all her medications and side effects. Duloxetine  and Oxybutynin side effects discussed, we decreased dose of Duloxetine . She does not want to change Oxybutynin because it is helping with urinary frequency/urgency. PT though HH will be arranged.

## 2023-07-26 NOTE — Assessment & Plan Note (Signed)
 Currently on Duloxetine  60 mg daily, which she has been taking for years.Duloxetine  dose decreased from 60 mg to 30 mg. Continue appropriate foot care and fall precautions.

## 2023-07-26 NOTE — Assessment & Plan Note (Signed)
 06/2020, no prior events. Because extension and no known trigger factors through to be better to continue Eliquis  5 mg bid lifelong. She has had frequent falls with head trauma and a few ED visits that required lacerations repair. Her daughter is very concerned about bleeding after trauma, difficult to stop. After long discussion of pros and cons, we decided to stop Eliquis . Instructed about warning signs.

## 2023-07-26 NOTE — Assessment & Plan Note (Signed)
 Problem has been stable. Continue adequate hydration, BP control, low-salt diet, and avoidance of NSAIDs. Continue olmesartan  20 mg daily.

## 2023-07-26 NOTE — Assessment & Plan Note (Signed)
 Problem has improved, she feels like we can try to decrease dose of Duloxetine . Will slowly decrease dose of Duloxetine  from 60 mg to 30 mg. Continue Wellbutrin  SR 100 mg bid. Instructed about warning signs. F/U in 3-4 months, before if needed.

## 2023-07-26 NOTE — Patient Instructions (Addendum)
 A few things to remember from today's visit:  Laceration of forehead, subsequent encounter  Frequent falls - Plan: Ambulatory referral to Home Health  DM (diabetes mellitus), type 2 with neurological complications (HCC) - Plan: POC HgB A1c  Stage 3a chronic kidney disease (HCC)  Recurrent major depressive disorder, in partial remission (HCC) - Plan: DULoxetine  (CYMBALTA ) 30 MG capsule  Peripheral polyneuropathy - Plan: DULoxetine  (CYMBALTA ) 30 MG capsule, Ambulatory referral to Home Health  Cerebrovascular accident (CVA) due to embolism of anterior cerebral artery, unspecified blood vessel laterality (HCC) - Plan: clopidogrel  (PLAVIX ) 75 MG tablet  Alternate Duloxetine  60 mg and 30 mg for 2 weeks then continue 30 mg daily. Eliquis  discontinued. Plavix  resumed.  If you need refills for medications you take chronically, please call your pharmacy. Do not use My Chart to request refills or for acute issues that need immediate attention. If you send a my chart message, it may take a few days to be addressed, specially if I am not in the office.  Please be sure medication list is accurate. If a new problem present, please set up appointment sooner than planned today.

## 2023-07-28 DIAGNOSIS — R350 Frequency of micturition: Secondary | ICD-10-CM | POA: Diagnosis not present

## 2023-08-01 ENCOUNTER — Ambulatory Visit: Admitting: Family Medicine

## 2023-08-04 ENCOUNTER — Other Ambulatory Visit: Payer: Self-pay

## 2023-08-04 ENCOUNTER — Emergency Department (HOSPITAL_COMMUNITY)

## 2023-08-04 ENCOUNTER — Encounter (HOSPITAL_COMMUNITY): Payer: Self-pay

## 2023-08-04 ENCOUNTER — Emergency Department (HOSPITAL_COMMUNITY)
Admission: EM | Admit: 2023-08-04 | Discharge: 2023-08-05 | Disposition: A | Discharge status: Home / Self Care | Attending: Emergency Medicine | Admitting: Emergency Medicine

## 2023-08-04 DIAGNOSIS — S0990XA Unspecified injury of head, initial encounter: Secondary | ICD-10-CM | POA: Insufficient documentation

## 2023-08-04 DIAGNOSIS — E119 Type 2 diabetes mellitus without complications: Secondary | ICD-10-CM | POA: Diagnosis not present

## 2023-08-04 DIAGNOSIS — W01198A Fall on same level from slipping, tripping and stumbling with subsequent striking against other object, initial encounter: Secondary | ICD-10-CM | POA: Insufficient documentation

## 2023-08-04 DIAGNOSIS — Y92009 Unspecified place in unspecified non-institutional (private) residence as the place of occurrence of the external cause: Secondary | ICD-10-CM | POA: Diagnosis not present

## 2023-08-04 DIAGNOSIS — M545 Low back pain, unspecified: Secondary | ICD-10-CM | POA: Diagnosis not present

## 2023-08-04 DIAGNOSIS — Z79899 Other long term (current) drug therapy: Secondary | ICD-10-CM | POA: Diagnosis not present

## 2023-08-04 DIAGNOSIS — Y9301 Activity, walking, marching and hiking: Secondary | ICD-10-CM | POA: Diagnosis not present

## 2023-08-04 DIAGNOSIS — S32020A Wedge compression fracture of second lumbar vertebra, initial encounter for closed fracture: Secondary | ICD-10-CM | POA: Diagnosis not present

## 2023-08-04 DIAGNOSIS — M898X8 Other specified disorders of bone, other site: Secondary | ICD-10-CM | POA: Diagnosis not present

## 2023-08-04 DIAGNOSIS — I10 Essential (primary) hypertension: Secondary | ICD-10-CM | POA: Diagnosis not present

## 2023-08-04 DIAGNOSIS — S32029A Unspecified fracture of second lumbar vertebra, initial encounter for closed fracture: Secondary | ICD-10-CM | POA: Diagnosis not present

## 2023-08-04 DIAGNOSIS — S3992XA Unspecified injury of lower back, initial encounter: Secondary | ICD-10-CM | POA: Diagnosis not present

## 2023-08-04 DIAGNOSIS — W19XXXA Unspecified fall, initial encounter: Secondary | ICD-10-CM

## 2023-08-04 DIAGNOSIS — I700 Atherosclerosis of aorta: Secondary | ICD-10-CM | POA: Diagnosis not present

## 2023-08-04 DIAGNOSIS — R29890 Loss of height: Secondary | ICD-10-CM | POA: Diagnosis not present

## 2023-08-04 MED ORDER — LIDOCAINE 5 % EX PTCH
1.0000 | MEDICATED_PATCH | CUTANEOUS | Status: DC
  Administered 2023-08-04: 1 via TRANSDERMAL
  Filled 2023-08-04: qty 1

## 2023-08-04 NOTE — ED Provider Notes (Incomplete)
 [place] EMERGENCY DEPARTMENT AT [facility] Provider Note   CSN: [id number] Arrival date & time: 08/04/23  2004     History  Chief Complaint  Patient presents with  . Fall    Alexa Brown is a 83 y.o. female with medical history significant for diabetes, GERD, hypertension, PE currently on Plavix .  Patient presents to ED for evaluation of fall.  Patient reports that she has frequent falls secondary to a gait abnormality that she has had for quite some time.  States that tonight she was walking from the living room to her kitchen when she had a fall and struck her forehead on the ground.  Denies loss of consciousness.  Takes Plavix  for PE, recently was switched from Eliquis  to Plavix  due to frequent falls.  Denies chest pain or shortness of breath prior to the fall.  Reports that this fall was very similar to past instances of fall.  States that she was immediately helped to her feet.  Here denying any headache, neck pain, nausea, vomiting, chest pain, shortness of breath.  She is endorsing low back pain in her lumbar spine which she states immediately began after falling.  Denies any bowel or bladder incontinence, groin numbness or distal weakness.   Fall       Home Medications Prior to Admission medications   Medication Sig Start Date End Date Taking? Authorizing Provider  Accu-Chek Softclix Lancets lancets Use to check blood sugars 1-2 times daily. Dx:E11.65 10/18/21   [place], Qiara G, MD  acetaminophen  (TYLENOL ) 325 MG tablet Take 650 mg by mouth every 6 (six) hours as needed for mild pain, fever or headache.    [provider]  allopurinol  (ZYLOPRIM ) 100 MG tablet TAKE 1 TABLET BY MOUTH TWICE A DAY 02/15/23   [place], Aixa G, MD  Alpha-Lipoic Acid 600 MG TABS Take 600 mg by mouth daily.    [provider]  b complex vitamins capsule Take 1 capsule by mouth daily.    [provider]  Blood Glucose Monitoring Suppl (ACCU-CHEK GUIDE)  w/Device KIT Use to check blood sugars 1-2 times daily. Dx:E11.65 10/18/21   [place], Kierria G, MD  buPROPion  ER (WELLBUTRIN  SR) 100 MG 12 hr tablet TAKE 1 TABLET BY MOUTH TWICE A DAY 05/16/23   [place], Lerline G, MD  calcium  carbonate (CALCIUM  600) 600 MG TABS tablet Take 600 mg by mouth daily.    [provider]  clopidogrel  (PLAVIX ) 75 MG tablet Take 1 tablet (75 mg total) by mouth daily. 07/26/23   [place], Eleaner G, MD  DULoxetine  (CYMBALTA ) 30 MG capsule Take 1 capsule (30 mg total) by mouth daily. 07/26/23   [place], Almyra G, MD  ezetimibe  (ZETIA ) 10 MG tablet TAKE 1 TABLET BY MOUTH EVERY DAY 12/12/22   [place], Lydiana G, MD  glucose blood (ACCU-CHEK GUIDE) test strip Use to test blood sugars 1-2 times daily. Dx:e11.65 10/18/21   [place], Faiza G, MD  Homeopathic Products [facility] RELIEF EX) Apply 1 application topically daily as needed (foot cramps).    [provider]  ketoconazole  (NIZORAL ) 2 % cream SMARTSIG:1 Application Topical 1 to 2 Times Daily 11/06/20   [provider]  L-Lysine  500 MG TABS Take 1 tablet by mouth daily.    [provider]  Lancet Devices [facility]) lancets Use to test blood sugar once a day. 03/31/16   [place], Aubrynn G, MD  LIVALO  2 MG TABS TAKE 1 TABLET BY MOUTH EVERY DAY 12/12/22  [place], Kyna G, MD  metroNIDAZOLE  (METROCREAM ) 0.75 % cream Apply topically 2 (two) times daily. Apply a thin layer twice a day to a clean dry face. 03/11/21   Viola Greulich, MD  Multiple Vitamin (MULTIVITAMIN) capsule Take 1 capsule by mouth daily.     [provider]  olmesartan  (BENICAR ) 20 MG tablet TAKE 1 TABLET BY MOUTH EVERY DAY 12/12/22   [place], Hydee G, MD  oxybutynin (DITROPAN-XL) 10 MG 24 hr tablet Take 10 mg by mouth daily. 06/29/22   [provider]  pantoprazole  (PROTONIX ) 20 MG tablet Take 1 tablet (20 mg total) by mouth daily. 06/01/21   [place], Eurika G, MD  triamcinolone  cream (KENALOG ) 0.5 % Apply 1 Application topically 3  (three) times daily. 05/11/23   Maurie Southern, DO      Allergies    Patient has no known allergies.    Review of Systems   Review of Systems  All other systems reviewed and are negative.   Physical Exam Updated Vital Signs BP 121/78   Pulse (!) 101   Temp 98.5 F (36.9 C) (Oral)   Resp 17   Ht 5\' 7"  (1.702 m)   Wt 75.3 kg   SpO2 92%   BMI 26.00 kg/m  Physical Exam Vitals and nursing note reviewed.  Constitutional:      General: She is not in acute distress.    Appearance: She is well-developed. She is not toxic-appearing.  HENT:     Head: Normocephalic and atraumatic.  Eyes:     Conjunctiva/sclera: Conjunctivae normal.  Cardiovascular:     Rate and Rhythm: Normal rate and regular rhythm.     Heart sounds: No murmur heard. Pulmonary:     Effort: Pulmonary effort is normal. No respiratory distress.     Breath sounds: Normal breath sounds.  Abdominal:     Palpations: Abdomen is soft.     Tenderness: There is no abdominal tenderness.  Musculoskeletal:        General: No swelling.     Cervical back: Neck supple.       Back:  Skin:    General: Skin is warm and dry.     Capillary Refill: Capillary refill takes less than 2 seconds.     Coloration: Skin is not jaundiced or pale.  Neurological:     Mental Status: She is alert and oriented to person, place, and time.  Psychiatric:        Mood and Affect: Mood normal.        Behavior: Behavior normal.     ED Results / Procedures / Treatments   Labs (all labs ordered are listed, but only abnormal results are displayed) Labs Reviewed - No data to display  EKG None  Radiology DG Lumbar Spine Complete Result Date: 08/04/2023 CLINICAL DATA:  pain fall EXAM: LUMBAR SPINE - COMPLETE 4+ VIEW COMPARISON:  CT angio chest 07/24/2020 FINDINGS: Possible slight interval worsening of a chronic T11 compression fracture. There is no evidence of lumbar spine fracture. Alignment is normal. Intervertebral disc spaces are maintained.  Atherosclerotic plaque. Redemonstration of scattered punctate calcifications of the spleen likely sequelae of prior granulomatous disease. IMPRESSION: 1. No acute displaced fracture or traumatic listhesis of the lumbar spine. Limited evaluation due to overlapping osseous structures and overlying soft tissues. 2. Possible slight interval worsening of a chronic T11 compression fracture. Correlate with point tenderness to palpation to evaluate for an acute component 3.  Aortic Atherosclerosis (ICD10-I70.0). Electronically Signed   By:  Morgane  Naveau M.D.   On: 08/04/2023 22:10   CT Head Wo Contrast Result Date: 08/04/2023 CLINICAL DATA:  Head trauma, coagulopathy (Age 75-66y) Pt fell today, hit back of head, is on plavix . C/o lower back pain. Pt is AANDOx4, no cognitive impairment. EXAM: CT HEAD WITHOUT CONTRAST TECHNIQUE: Contiguous axial images were obtained from the base of the skull through the vertex without intravenous contrast. RADIATION DOSE REDUCTION: This exam was performed according to the departmental dose-optimization program which includes automated exposure control, adjustment of the mA and/or kV according to patient size and/or use of iterative reconstruction technique. COMPARISON:  CT head 07/21/2023 FINDINGS: Brain: Cerebral ventricle sizes are concordant with the degree of cerebral volume loss. Stable patchy and confluent areas of decreased attenuation are noted throughout the deep and periventricular white matter of the cerebral hemispheres bilaterally, compatible with chronic microvascular ischemic disease. No evidence of large-territorial acute infarction. No parenchymal hemorrhage. No mass lesion. No extra-axial collection. No mass effect or midline shift. No hydrocephalus. Basilar cisterns are patent. Vascular: No hyperdense vessel. Atherosclerotic calcifications are present within the cavernous internal carotid and vertebral arteries. Skull: No acute fracture or focal lesion. Sinuses/Orbits:  Paranasal sinuses and mastoid air cells are clear. Bilateral lens replacement. Otherwise the orbits are unremarkable. Other: Right parieto-occipital scalp 6 mm hematoma formation. IMPRESSION: 1. No acute intracranial abnormality. 2.  Right parieto-occipital scalp 6 mm hematoma formation. Electronically Signed   By: Morgane  Naveau M.D.   On: 08/04/2023 21:43    Procedures Procedures  {Document cardiac monitor, telemetry assessment procedure when appropriate:1}  Medications Ordered in ED Medications  lidocaine  (LIDODERM ) 5 % 1 patch (1 patch Transdermal Patch Applied 08/04/23 2126)    ED Course/ Medical Decision Making/ A&P   {   Click here for ABCD2, HEART and other calculatorsREFRESH Note before signing :1}                              Medical Decision Making Amount and/or Complexity of Data Reviewed Radiology: ordered.   ***  {Document critical care time when appropriate:1} {Document review of labs and clinical decision tools ie heart score, Chads2Vasc2 etc:1}  {Document your independent review of radiology images, and any outside records:1} {Document your discussion with family members, caretakers, and with consultants:1} {Document social determinants of health affecting pt's care:1} {Document your decision making why or why not admission, treatments were needed:1} Final Clinical Impression(s) / ED Diagnoses Final diagnoses:  None    Rx / DC Orders ED Discharge Orders     None

## 2023-08-04 NOTE — ED Triage Notes (Signed)
 Pt fell today, hit back of head, is on plavix . C/o lower back pain. Pt is A&Ox4, no cognitive impairment.

## 2023-08-04 NOTE — ED Provider Notes (Signed)
 [place] EMERGENCY DEPARTMENT AT [facility] Provider Note   CSN: [id number] Arrival date & time: 08/04/23  2004     History  Chief Complaint  Patient presents with   Alexa Brown is a 83 y.o. female with medical history significant for diabetes, GERD, hypertension, PE currently on Plavix .  Patient presents to ED for evaluation of fall.  Patient reports that she has frequent falls secondary to a gait abnormality that she has had for quite some time.  States that tonight she was walking from the living room to her kitchen when she had a fall and struck her forehead on the ground.  Denies loss of consciousness.  Takes Plavix  for PE, recently was switched from Eliquis  to Plavix  due to frequent falls.  Denies chest pain or shortness of breath prior to the fall.  Reports that this fall was very similar to past instances of fall.  States that she was immediately helped to her feet.  Here denying any headache, neck pain, nausea, vomiting, chest pain, shortness of breath.  She is endorsing low back pain in her lumbar spine which she states immediately began after falling.  Denies any bowel or bladder incontinence, groin numbness or distal weakness.   Fall       Home Medications Prior to Admission medications   Medication Sig Start Date End Date Taking? Authorizing Provider  Accu-Chek Softclix Lancets lancets Use to check blood sugars 1-2 times daily. Dx:E11.65 10/18/21   [place], Calyse G, MD  acetaminophen  (TYLENOL ) 325 MG tablet Take 650 mg by mouth every 6 (six) hours as needed for mild pain, fever or headache.    [provider]  allopurinol  (ZYLOPRIM ) 100 MG tablet TAKE 1 TABLET BY MOUTH TWICE A DAY 02/15/23   [place], Christne G, MD  Alpha-Lipoic Acid 600 MG TABS Take 600 mg by mouth daily.    [provider]  b complex vitamins capsule Take 1 capsule by mouth daily.    [provider]  Blood Glucose Monitoring Suppl (ACCU-CHEK GUIDE)  w/Device KIT Use to check blood sugars 1-2 times daily. Dx:E11.65 10/18/21   [place], Gweneth G, MD  buPROPion  ER (WELLBUTRIN  SR) 100 MG 12 hr tablet TAKE 1 TABLET BY MOUTH TWICE A DAY 05/16/23   [place], Helyn G, MD  calcium  carbonate (CALCIUM  600) 600 MG TABS tablet Take 600 mg by mouth daily.    [provider]  clopidogrel  (PLAVIX ) 75 MG tablet Take 1 tablet (75 mg total) by mouth daily. 07/26/23   [place], Notnamed G, MD  DULoxetine  (CYMBALTA ) 30 MG capsule Take 1 capsule (30 mg total) by mouth daily. 07/26/23   [place], Johari G, MD  ezetimibe  (ZETIA ) 10 MG tablet TAKE 1 TABLET BY MOUTH EVERY DAY 12/12/22   [place], Zhaniya G, MD  glucose blood (ACCU-CHEK GUIDE) test strip Use to test blood sugars 1-2 times daily. Dx:e11.65 10/18/21   [place], Tanaysia G, MD  Homeopathic Products [facility] RELIEF EX) Apply 1 application topically daily as needed (foot cramps).    [provider]  ketoconazole  (NIZORAL ) 2 % cream SMARTSIG:1 Application Topical 1 to 2 Times Daily 11/06/20   [provider]  L-Lysine  500 MG TABS Take 1 tablet by mouth daily.    [provider]  Lancet Devices [facility]) lancets Use to test blood sugar once a day. 03/31/16   [place], Aubrynn G, MD  LIVALO  2 MG TABS TAKE 1 TABLET BY MOUTH EVERY DAY 12/12/22  [place], Dorri G, MD  metroNIDAZOLE  (METROCREAM ) 0.75 % cream Apply topically 2 (two) times daily. Apply a thin layer twice a day to a clean dry face. 03/11/21   Viola Greulich, MD  Multiple Vitamin (MULTIVITAMIN) capsule Take 1 capsule by mouth daily.     [provider]  olmesartan  (BENICAR ) 20 MG tablet TAKE 1 TABLET BY MOUTH EVERY DAY 12/12/22   [place], Joyce G, MD  oxybutynin (DITROPAN-XL) 10 MG 24 hr tablet Take 10 mg by mouth daily. 06/29/22   [provider]  pantoprazole  (PROTONIX ) 20 MG tablet Take 1 tablet (20 mg total) by mouth daily. 06/01/21   [place], Meka G, MD  triamcinolone  cream (KENALOG ) 0.5 % Apply 1 Application topically 3  (three) times daily. 05/11/23   Maurie Southern, DO      Allergies    Patient has no known allergies.    Review of Systems   Review of Systems  All other systems reviewed and are negative.   Physical Exam Updated Vital Signs BP (!) 150/65   Pulse 84   Temp 98.6 F (37 C) (Oral)   Resp 14   Ht 5\' 7"  (1.702 m)   Wt 75.3 kg   SpO2 93%   BMI 26.00 kg/m  Physical Exam Vitals and nursing note reviewed.  Constitutional:      General: She is not in acute distress.    Appearance: She is well-developed. She is not toxic-appearing.  HENT:     Head: Normocephalic and atraumatic.  Eyes:     Conjunctiva/sclera: Conjunctivae normal.  Cardiovascular:     Rate and Rhythm: Normal rate and regular rhythm.     Heart sounds: No murmur heard. Pulmonary:     Effort: Pulmonary effort is normal. No respiratory distress.     Breath sounds: Normal breath sounds.  Abdominal:     Palpations: Abdomen is soft.     Tenderness: There is no abdominal tenderness.  Musculoskeletal:        General: No swelling.     Cervical back: Neck supple.       Back:  Skin:    General: Skin is warm and dry.     Capillary Refill: Capillary refill takes less than 2 seconds.     Coloration: Skin is not jaundiced or pale.  Neurological:     Mental Status: She is alert and oriented to person, place, and time.     GCS: GCS eye subscore is 4. GCS verbal subscore is 5. GCS motor subscore is 6.     Cranial Nerves: Cranial nerves 2-12 are intact.     Sensory: Sensation is intact.     Motor: Motor function is intact. No weakness.     Comments: 5 out of 5 strength bilateral lower extremities.  DTRs intact.  Psychiatric:        Mood and Affect: Mood normal.        Behavior: Behavior normal.     ED Results / Procedures / Treatments   Labs (all labs ordered are listed, but only abnormal results are displayed) Labs Reviewed - No data to display  EKG None  Radiology CT Lumbar Spine Wo Contrast Result Date:  08/05/2023 CLINICAL DATA:  Initial evaluation for acute trauma. EXAM: CT LUMBAR SPINE WITHOUT CONTRAST TECHNIQUE: Multidetector CT imaging of the lumbar spine was performed without intravenous contrast administration. Multiplanar CT image reconstructions were also generated. RADIATION DOSE REDUCTION: This exam was performed according to the departmental dose-optimization program which includes automated  exposure control, adjustment of the mA and/or kV according to patient size and/or use of iterative reconstruction technique. COMPARISON:  Of radiograph from earlier the same day. FINDINGS: Segmentation: Standard. Lowest well-formed disc space labeled the L5-S1 level. Alignment: Physiologic with preservation of the normal lumbar lordosis. No listhesis. Vertebrae: Compression deformity involving the superior endplate of L2 with mild 20% height loss without significant bony retropulsion, likely acute given the history of trauma and back pain. Vertebral body height otherwise maintained with no other acute or chronic fracture. Visualized sacrum and pelvis intact. Benign bone island noted at the posterior left iliac wing. No worrisome osseous lesions. Paraspinal and other soft tissues: Paraspinous soft tissues demonstrate no acute finding. Aorto bi-iliac atherosclerotic disease. Disc levels: Mild for age degenerative disc disease with minor noncompressive disc bulging at L3-4 through L5-S1. Mild-to-moderate multilevel facet hypertrophy. No significant spinal stenosis by CT. IMPRESSION: 1. Compression deformity involving the superior endplate of L2 with mild 20% height loss without significant bony retropulsion, likely acute in nature given the history of trauma and back pain. Correlation with physical exam recommended. 2. No other acute osseous abnormality within the lumbar spine. 3.  Aortic Atherosclerosis (ICD10-I70.0). Electronically Signed   By: Virgia Griffins M.D.   On: 08/05/2023 01:45   DG Lumbar Spine  Complete Result Date: 08/04/2023 CLINICAL DATA:  pain fall EXAM: LUMBAR SPINE - COMPLETE 4+ VIEW COMPARISON:  CT angio chest 07/24/2020 FINDINGS: Possible slight interval worsening of a chronic T11 compression fracture. There is no evidence of lumbar spine fracture. Alignment is normal. Intervertebral disc spaces are maintained. Atherosclerotic plaque. Redemonstration of scattered punctate calcifications of the spleen likely sequelae of prior granulomatous disease. IMPRESSION: 1. No acute displaced fracture or traumatic listhesis of the lumbar spine. Limited evaluation due to overlapping osseous structures and overlying soft tissues. 2. Possible slight interval worsening of a chronic T11 compression fracture. Correlate with point tenderness to palpation to evaluate for an acute component 3.  Aortic Atherosclerosis (ICD10-I70.0). Electronically Signed   By: Morgane  Naveau M.D.   On: 08/04/2023 22:10   CT Head Wo Contrast Result Date: 08/04/2023 CLINICAL DATA:  Head trauma, coagulopathy (Age 87-64y) Pt fell today, hit back of head, is on plavix . C/o lower back pain. Pt is AANDOx4, no cognitive impairment. EXAM: CT HEAD WITHOUT CONTRAST TECHNIQUE: Contiguous axial images were obtained from the base of the skull through the vertex without intravenous contrast. RADIATION DOSE REDUCTION: This exam was performed according to the departmental dose-optimization program which includes automated exposure control, adjustment of the mA and/or kV according to patient size and/or use of iterative reconstruction technique. COMPARISON:  CT head 07/21/2023 FINDINGS: Brain: Cerebral ventricle sizes are concordant with the degree of cerebral volume loss. Stable patchy and confluent areas of decreased attenuation are noted throughout the deep and periventricular white matter of the cerebral hemispheres bilaterally, compatible with chronic microvascular ischemic disease. No evidence of large-territorial acute infarction. No  parenchymal hemorrhage. No mass lesion. No extra-axial collection. No mass effect or midline shift. No hydrocephalus. Basilar cisterns are patent. Vascular: No hyperdense vessel. Atherosclerotic calcifications are present within the cavernous internal carotid and vertebral arteries. Skull: No acute fracture or focal lesion. Sinuses/Orbits: Paranasal sinuses and mastoid air cells are clear. Bilateral lens replacement. Otherwise the orbits are unremarkable. Other: Right parieto-occipital scalp 6 mm hematoma formation. IMPRESSION: 1. No acute intracranial abnormality. 2.  Right parieto-occipital scalp 6 mm hematoma formation. Electronically Signed   By: Morgane  Naveau M.D.   On: 08/04/2023 21:43  Procedures Procedures   Medications Ordered in ED Medications  lidocaine  (LIDODERM ) 5 % 1 patch (1 patch Transdermal Patch Applied 08/04/23 2126)    ED Course/ Medical Decision Making/ A&P  Medical Decision Making Amount and/or Complexity of Data Reviewed Radiology: ordered.   83 year old female presents for evaluation.  Please see HPI for further details.  On examination the patient is afebrile and nontachycardic.  Her lung sounds are clear bilaterally, she is not hypoxic.  Abdomen soft and compressible.  Neurological examination is at baseline.  5 out of 5 strength bilateral lower extremities.  DTRs intact.   Patient recommended she had it in triage include CT head, x-ray imaging of lumbar spine.  CT head unremarkable.  Lumbar spine x-ray shows no acute fracture however they do note that that is a limited evaluation due to overlapping osseous structures and overlying soft tissue.  Patient does have midline lumbar spinal tenderness so we will assess with CT lumbar spine.  CT lumbar spine shows a compression deformity involving superior endplate of the L2 with mild 20% height loss without significant bony retropulsion.  Likely acute in nature.  Physical exam correlates with injury.  Patient has 5  out of 5 strength bilateral lower extremities.  DTRs are intact.  Discussed with attending Dr. Palombo.  No indication for TLSO brace at this time.  Will refer patient to neurosurgery as well as her PCP for further management and care.  These findings were discussed with the patient and her daughter at the bedside.  All questions were answered to their satisfaction.  Patient is stable to discharge at this time.    Final Clinical Impression(s) / ED Diagnoses Final diagnoses:  Fall, initial encounter  Compression fracture of L2 vertebra, initial encounter [facility])    Rx / DC Orders ED Discharge Orders     None         Kristin Peyer 08/05/23 7829    Arvilla Birmingham, MD 08/05/23 1310

## 2023-08-04 NOTE — ED Provider Triage Note (Signed)
 Emergency Medicine Provider Triage Evaluation Note  Alexa POINDEXTER , a 83 y.o. female  was evaluated in triage.  Pt complains of fall. Patient tripped and fell hitting the back of her head. Endorses LOC and low back pain. Stopped Eliquis  last week due to her fall history. Currently on plavix . Sutures at left temple lac from fall last week.  Review of Systems  Positive: Hematoma, LOC Negative: Neck pain, weakness, vision changes, confusion  Physical Exam  BP 121/78   Pulse (!) 101   Temp 98.5 F (36.9 C) (Oral)   Resp 17   Ht 5\' 7"  (1.702 m)   Wt 75.3 kg   SpO2 92%   BMI 26.00 kg/m  Gen:   Awake, no distress   Resp:  Normal effort  MSK:   Moves extremities without difficulty  Other:    Medical Decision Making  Medically screening exam initiated at 9:04 PM.  Appropriate orders placed.  Levetta Aran Burnsed-Siders was informed that the remainder of the evaluation will be completed by another provider, this initial triage assessment does not replace that evaluation, and the importance of remaining in the ED until their evaluation is complete.   Sonnie Dusky, PA-C 08/04/23 2108

## 2023-08-05 NOTE — Discharge Instructions (Signed)
 It was a pleasure taking part in your care.  As discussed, the scans of your head are unremarkable and do not show any kind of acute process.  The scan of your low back does show a possible compression fracture.  Please follow-up with Dr. Benedetta Bradley of neurosurgery.  Please call his office on Monday make an appointment to be seen.  Please also follow-up with PCP to discuss bisphosphonate therapy.  Please return to the ED with any new or worsening symptoms.  Take Tylenol  at home for pain control every 6 hours as needed.

## 2023-08-07 ENCOUNTER — Telehealth: Payer: Self-pay | Admitting: Family Medicine

## 2023-08-07 NOTE — Telephone Encounter (Unsigned)
 Copied from CRM [id number]. Topic: Referral - Status >> Aug 07, 2023  2:46 PM Danae Duncans wrote: Reason for CRM: Pt daughter Jullie Oiler called to check status of what agency will be accepting pt for home health, pt daughter can be reached at [phone]

## 2023-08-09 ENCOUNTER — Encounter: Payer: Self-pay | Admitting: Family Medicine

## 2023-08-09 ENCOUNTER — Ambulatory Visit (INDEPENDENT_AMBULATORY_CARE_PROVIDER_SITE_OTHER): Admitting: Family Medicine

## 2023-08-09 VITALS — BP 124/72 | HR 89 | Temp 98.6°F | Resp 16 | Wt 165.4 lb

## 2023-08-09 DIAGNOSIS — R296 Repeated falls: Secondary | ICD-10-CM | POA: Diagnosis not present

## 2023-08-09 DIAGNOSIS — G629 Polyneuropathy, unspecified: Secondary | ICD-10-CM

## 2023-08-09 DIAGNOSIS — S32020D Wedge compression fracture of second lumbar vertebra, subsequent encounter for fracture with routine healing: Secondary | ICD-10-CM | POA: Diagnosis not present

## 2023-08-09 DIAGNOSIS — F3341 Major depressive disorder, recurrent, in partial remission: Secondary | ICD-10-CM | POA: Diagnosis not present

## 2023-08-09 NOTE — Assessment & Plan Note (Signed)
 She is now on Duloxetine  30 mg daily, decreased from 60 mg and reports no mood changes. She is also on Wellbutrin  SR 100 mg twice daily. No changes today.

## 2023-08-09 NOTE — Assessment & Plan Note (Signed)
 She reports problem as stable after decreasing duloxetine  from 60 mg to 30 mg. Continue appropriate foot/skin care.

## 2023-08-09 NOTE — Progress Notes (Signed)
 ACUTE VISIT Chief Complaint  Patient presents with   Follow-up    ED f/u   HPI: Alexa Brown is a 83 y.o. female with a PMHx significant for DM II, peripheral neuropathy, PE on chronic anticoagulation, CVA, unstable gait, HLD, HTN, GERD, and CKD III, who is here today with her daughter for hospital follow up.  She was last seen on 07/26/2023 for hospital follow-up after a fall.  She presented to the ED on 08/04/2023 for evaluation after a fall at home.    Daughter states she fell while moving from the kitchen to the living room, she was holding a cup and not using her walker.  Lumbar CT revealed a compression L2 fracture. Her appointment with neurosurgery is 5/21.  Daughter reports she has not had any acute episodes of confusion or headaches.  Since returning home, she says she is having significant pain in her back, especially when standing up or sitting down. She rates the pain as a 9/10 when getting up, and says it is in the middle of her lower back.  Also has pain when breathing deeply.  Has been taking tylenol  and using a heating pad, but they have not helped.   Daughter also mentions she has not been called by home health yet, referral was placed last visit to start PT for fall prevention.  CT lumbar spine from 08/04/2023 Impression:  1.) Compression deformity involving the superior endplate of L2 with mild 20% height loss without significant bony retropulsion, likely acute in nature given the history of trauma and back pain. Correlation with physical exam recommended.  2.) No other acute osseous abnormality within the lumbar spine.  3.) Aortic Atherosclerosis (ICD 10-I70.0).   CT Head from 08/04/2023 Impression:  1.) No acute intracranial abnormality.  2.) Right parieto-occipital scalp 6 mm hematoma formation.   She mentions that her constipation has improved with the use of Chia a few times per week.  Depression:  Last visit duloxetine  was decreased from 60 mg to  30 mg, she is also on Wellbutrin  SR 100 mg twice daily. She has not noticed changes in her mood since lowering her dose of duloxetine .  Peripheral neuropathy: She has not noticed changes after duloxetine  dose decreased.  Review of Systems  Constitutional:  Negative for appetite change, chills and fever.  HENT:  Negative for sore throat and trouble swallowing.   Respiratory:  Negative for shortness of breath.   Cardiovascular:  Negative for chest pain and palpitations.  Gastrointestinal:  Negative for abdominal pain, nausea and vomiting.  Genitourinary:  Negative for decreased urine volume, dysuria and hematuria.  Musculoskeletal:  Positive for back pain and gait problem.  Neurological:  Negative for syncope and facial asymmetry.  Psychiatric/Behavioral:  Negative for confusion and hallucinations.   See other pertinent positives and negatives in HPI.  Current Outpatient Medications on File Prior to Visit  Medication Sig Dispense Refill   Accu-Chek Softclix Lancets lancets Use to check blood sugars 1-2 times daily. Dx:E11.65 200 each 12   acetaminophen  (TYLENOL ) 325 MG tablet Take 650 mg by mouth every 6 (six) hours as needed for mild pain, fever or headache.     allopurinol  (ZYLOPRIM ) 100 MG tablet TAKE 1 TABLET BY MOUTH TWICE A DAY 180 tablet 1   Alpha-Lipoic Acid 600 MG TABS Take 600 mg by mouth daily.     b complex vitamins capsule Take 1 capsule by mouth daily.     Blood Glucose Monitoring Suppl (ACCU-CHEK GUIDE) w/Device KIT  Use to check blood sugars 1-2 times daily. Dx:E11.65 1 kit 0   buPROPion  ER (WELLBUTRIN  SR) 100 MG 12 hr tablet TAKE 1 TABLET BY MOUTH TWICE A DAY 180 tablet 1   calcium  carbonate (CALCIUM  600) 600 MG TABS tablet Take 600 mg by mouth daily.     clopidogrel  (PLAVIX ) 75 MG tablet Take 1 tablet (75 mg total) by mouth daily. 90 tablet 2   DULoxetine  (CYMBALTA ) 30 MG capsule Take 1 capsule (30 mg total) by mouth daily. 90 capsule 1   ezetimibe  (ZETIA ) 10 MG tablet TAKE  1 TABLET BY MOUTH EVERY DAY 90 tablet 3   glucose blood (ACCU-CHEK GUIDE) test strip Use to test blood sugars 1-2 times daily. Dx:e11.65 200 each 12   Homeopathic Products (THERAWORX RELIEF EX) Apply 1 application topically daily as needed (foot cramps).     ketoconazole  (NIZORAL ) 2 % cream SMARTSIG:1 Application Topical 1 to 2 Times Daily     L-Lysine  500 MG TABS Take 1 tablet by mouth daily.     Lancet Devices (ACCU-CHEK SOFTCLIX) lancets Use to test blood sugar once a day. 100 each 2   LIVALO  2 MG TABS TAKE 1 TABLET BY MOUTH EVERY DAY 90 tablet 3   metroNIDAZOLE  (METROCREAM ) 0.75 % cream Apply topically 2 (two) times daily. Apply a thin layer twice a day to a clean dry face. 45 g 0   Multiple Vitamin (MULTIVITAMIN) capsule Take 1 capsule by mouth daily.      olmesartan  (BENICAR ) 20 MG tablet TAKE 1 TABLET BY MOUTH EVERY DAY 90 tablet 2   oxybutynin (DITROPAN-XL) 10 MG 24 hr tablet Take 10 mg by mouth daily.     pantoprazole  (PROTONIX ) 20 MG tablet Take 1 tablet (20 mg total) by mouth daily. 90 tablet 2   triamcinolone  cream (KENALOG ) 0.5 % Apply 1 Application topically 3 (three) times daily. 30 g 0   No current facility-administered medications on file prior to visit.   Past Medical History:  Diagnosis Date   Chicken pox    Depression    Diabetes mellitus without complication (HCC)    GERD (gastroesophageal reflux disease)    Hyperlipidemia    Hypertension    Stroke [facility])    UTI (urinary tract infection)    No Known Allergies  Social History   Socioeconomic History   Marital status: Widowed    Spouse name: Not on file   Number of children: Not on file   Years of education: Not on file   Highest education level: 12th grade  Occupational History   Occupation: N/A  Tobacco Use   Smoking status: Never   Smokeless tobacco: Never  Vaping Use   Vaping status: Never Used  Substance and Sexual Activity   Alcohol use: Yes    Comment: occassional wine   Drug use: No   Sexual  activity: Not Currently  Other Topics Concern   Not on file  Social History Narrative   Pt lives in single story home with her daughter   Has 2 children   Some college education   Retired Copywriter, advertising for [employer]   Social Drivers of Health   Financial Resource Strain: Low Risk  (05/11/2023)   Overall Financial Resource Strain (CARDIA)    Difficulty of Paying Living Expenses: Not hard at all  Food Insecurity: No Food Insecurity (05/11/2023)   Hunger Vital Sign    Worried About Running Out of Food in the Last Year: Never true    Ran  Out of Food in the Last Year: Never true  Transportation Needs: No Transportation Needs (05/11/2023)   PRAPARE - Administrator, Civil Service (Medical): No    Lack of Transportation (Non-Medical): No  Physical Activity: Inactive (05/11/2023)   Exercise Vital Sign    Days of Exercise per Week: 0 days    Minutes of Exercise per Session: 0 min  Stress: No Stress Concern Present (05/11/2023)   [employer] of Occupational Health - Occupational Stress Questionnaire    Feeling of Stress : Not at all  Social Connections: Socially Isolated (05/11/2023)   Social Connection and Isolation Panel [NHANES]    Frequency of Communication with Friends and Family: More than three times a week    Frequency of Social Gatherings with Friends and Family: More than three times a week    Attends Religious Services: Never    Database administrator or Organizations: No    Attends Banker Meetings: Never    Marital Status: Widowed   Vitals:   08/09/23 1553  BP: 124/72  Pulse: 89  Resp: 16  Temp: 98.6 F (37 C)  SpO2: 92%   Body mass index is 25.91 kg/m.  Physical Exam Vitals and nursing note reviewed.  Constitutional:      General: She is not in acute distress.    Appearance: She is well-developed.  HENT:     Head: Normocephalic and atraumatic.   Eyes:     Conjunctiva/sclera: Conjunctivae normal.  Cardiovascular:      Rate and Rhythm: Normal rate and regular rhythm.     Heart sounds: No murmur heard. Pulmonary:     Effort: Pulmonary effort is normal. No respiratory distress.     Breath sounds: Normal breath sounds.  Abdominal:     Palpations: Abdomen is soft.     Tenderness: There is no abdominal tenderness.  Skin:    General: Skin is warm.     Findings: No erythema or rash.  Neurological:     Mental Status: She is alert and oriented to person, place, and time.     Comments: Unstable gait assisted with a walker. Difficulty standing up from sitting position while holding walker.  Psychiatric:        Mood and Affect: Mood and affect normal.   ASSESSMENT AND PLAN:  Ms. Camhi was seen today for hospital follow up.   Closed compression fracture of L2 lumbar vertebra with routine healing, subsequent encounter Continue pain management with acetaminophen  500 mg 3-4 times per day, she can also try IcyHot patch. Due to risk for falls or other side effects, I do not recommend opioid meds for pain management. She has an appointment with neurosurgeon on 08/16/2023.  Frequent falls Assessment & Plan: Several risk factors for falls. Fall precautions discussed. Pending PT through [facility], while here in the office our scheduler has contacted agency and they said they will contact pt tomorrow.  Recurrent major depressive disorder, in partial remission (HCC) Assessment & Plan: She is now on Duloxetine  30 mg daily, decreased from 60 mg and reports no mood changes. She is also on Wellbutrin  SR 100 mg twice daily. No changes today.  Peripheral polyneuropathy Assessment & Plan: She reports problem as stable after decreasing duloxetine  from 60 mg to 30 mg. Continue appropriate foot/skin care.  I spent a total of 34 minutes in both face to face and non face to face activities for this visit on the date of this encounter. During this time history  was obtained and documented, examination was performed, prior  labs/imaging reviewed, and assessment/plan discussed.  Return if symptoms worsen or fail to improve, for keep next appointment.  I, Fritz Jewel Wierda, acting as a scribe for Anaia Frith [place], MD., have documented all relevant documentation on the behalf of Federico Maiorino [place], MD, as directed by  Daman Steffenhagen [place], MD while in the presence of Dareth Andrew [place], MD.   I, Laury Huizar [place], MD, have reviewed all documentation for this visit. The documentation on 08/09/23 for the exam, diagnosis, procedures, and orders are all accurate and complete.  Carlie Corp[place] G. [place], MD  [facility]. [place] office.

## 2023-08-09 NOTE — Assessment & Plan Note (Addendum)
 Several risk factors for falls. Fall precautions discussed. Pending PT through [facility], while here in the office our scheduler has contacted agency and they said they will contact pt tomorrow.

## 2023-08-09 NOTE — Patient Instructions (Addendum)
 A few things to remember from today's visit:  Closed compression fracture of L2 lumbar vertebra with routine healing, subsequent encounter  Frequent falls No changes. We are trying to call agency and have PT at home. Keep appt with neurosurgeon.  If you need refills for medications you take chronically, please call your pharmacy. Do not use My Chart to request refills or for acute issues that need immediate attention. If you send a my chart message, it may take a few days to be addressed, specially if I am not in the office.  Please be sure medication list is accurate. If a new problem present, please set up appointment sooner than planned today.

## 2023-08-13 ENCOUNTER — Other Ambulatory Visit: Payer: Self-pay | Admitting: Family Medicine

## 2023-08-14 DIAGNOSIS — E785 Hyperlipidemia, unspecified: Secondary | ICD-10-CM | POA: Diagnosis not present

## 2023-08-14 DIAGNOSIS — Z8673 Personal history of transient ischemic attack (TIA), and cerebral infarction without residual deficits: Secondary | ICD-10-CM | POA: Diagnosis not present

## 2023-08-14 DIAGNOSIS — W19XXXD Unspecified fall, subsequent encounter: Secondary | ICD-10-CM | POA: Diagnosis not present

## 2023-08-14 DIAGNOSIS — K219 Gastro-esophageal reflux disease without esophagitis: Secondary | ICD-10-CM | POA: Diagnosis not present

## 2023-08-14 DIAGNOSIS — N183 Chronic kidney disease, stage 3 unspecified: Secondary | ICD-10-CM | POA: Diagnosis not present

## 2023-08-14 DIAGNOSIS — E1140 Type 2 diabetes mellitus with diabetic neuropathy, unspecified: Secondary | ICD-10-CM | POA: Diagnosis not present

## 2023-08-14 DIAGNOSIS — Z86711 Personal history of pulmonary embolism: Secondary | ICD-10-CM | POA: Diagnosis not present

## 2023-08-14 DIAGNOSIS — M4856XD Collapsed vertebra, not elsewhere classified, lumbar region, subsequent encounter for fracture with routine healing: Secondary | ICD-10-CM | POA: Diagnosis not present

## 2023-08-14 DIAGNOSIS — E1122 Type 2 diabetes mellitus with diabetic chronic kidney disease: Secondary | ICD-10-CM | POA: Diagnosis not present

## 2023-08-14 DIAGNOSIS — Z7902 Long term (current) use of antithrombotics/antiplatelets: Secondary | ICD-10-CM | POA: Diagnosis not present

## 2023-08-14 DIAGNOSIS — I129 Hypertensive chronic kidney disease with stage 1 through stage 4 chronic kidney disease, or unspecified chronic kidney disease: Secondary | ICD-10-CM | POA: Diagnosis not present

## 2023-08-14 DIAGNOSIS — Z87440 Personal history of urinary (tract) infections: Secondary | ICD-10-CM | POA: Diagnosis not present

## 2023-08-14 DIAGNOSIS — Z9181 History of falling: Secondary | ICD-10-CM | POA: Diagnosis not present

## 2023-08-16 DIAGNOSIS — S32020A Wedge compression fracture of second lumbar vertebra, initial encounter for closed fracture: Secondary | ICD-10-CM | POA: Diagnosis not present

## 2023-08-16 DIAGNOSIS — R350 Frequency of micturition: Secondary | ICD-10-CM | POA: Diagnosis not present

## 2023-08-17 ENCOUNTER — Telehealth: Payer: Self-pay | Admitting: *Deleted

## 2023-08-17 DIAGNOSIS — N183 Chronic kidney disease, stage 3 unspecified: Secondary | ICD-10-CM | POA: Diagnosis not present

## 2023-08-17 DIAGNOSIS — Z87440 Personal history of urinary (tract) infections: Secondary | ICD-10-CM | POA: Diagnosis not present

## 2023-08-17 DIAGNOSIS — I129 Hypertensive chronic kidney disease with stage 1 through stage 4 chronic kidney disease, or unspecified chronic kidney disease: Secondary | ICD-10-CM | POA: Diagnosis not present

## 2023-08-17 DIAGNOSIS — Z7902 Long term (current) use of antithrombotics/antiplatelets: Secondary | ICD-10-CM | POA: Diagnosis not present

## 2023-08-17 DIAGNOSIS — E1140 Type 2 diabetes mellitus with diabetic neuropathy, unspecified: Secondary | ICD-10-CM | POA: Diagnosis not present

## 2023-08-17 DIAGNOSIS — E785 Hyperlipidemia, unspecified: Secondary | ICD-10-CM | POA: Diagnosis not present

## 2023-08-17 DIAGNOSIS — Z8673 Personal history of transient ischemic attack (TIA), and cerebral infarction without residual deficits: Secondary | ICD-10-CM | POA: Diagnosis not present

## 2023-08-17 DIAGNOSIS — W19XXXD Unspecified fall, subsequent encounter: Secondary | ICD-10-CM | POA: Diagnosis not present

## 2023-08-17 DIAGNOSIS — Z86711 Personal history of pulmonary embolism: Secondary | ICD-10-CM | POA: Diagnosis not present

## 2023-08-17 DIAGNOSIS — Z9181 History of falling: Secondary | ICD-10-CM | POA: Diagnosis not present

## 2023-08-17 DIAGNOSIS — M4856XD Collapsed vertebra, not elsewhere classified, lumbar region, subsequent encounter for fracture with routine healing: Secondary | ICD-10-CM | POA: Diagnosis not present

## 2023-08-17 DIAGNOSIS — K219 Gastro-esophageal reflux disease without esophagitis: Secondary | ICD-10-CM | POA: Diagnosis not present

## 2023-08-17 DIAGNOSIS — E1122 Type 2 diabetes mellitus with diabetic chronic kidney disease: Secondary | ICD-10-CM | POA: Diagnosis not present

## 2023-08-17 NOTE — Telephone Encounter (Signed)
 Copied from CRM [id number]. Topic: Clinical - Home Health Verbal Orders >> Aug 17, 2023 11:52 AM Chuck Crater wrote: Caller/Agency: [facility] Callback Number: [phone] confidential voicemail Service Requested: Occupational Therapy Frequency: 1 week 8  Any new concerns about the patient? No

## 2023-08-18 DIAGNOSIS — Z9181 History of falling: Secondary | ICD-10-CM | POA: Diagnosis not present

## 2023-08-18 DIAGNOSIS — E1140 Type 2 diabetes mellitus with diabetic neuropathy, unspecified: Secondary | ICD-10-CM | POA: Diagnosis not present

## 2023-08-18 DIAGNOSIS — E1122 Type 2 diabetes mellitus with diabetic chronic kidney disease: Secondary | ICD-10-CM | POA: Diagnosis not present

## 2023-08-18 DIAGNOSIS — Z7902 Long term (current) use of antithrombotics/antiplatelets: Secondary | ICD-10-CM | POA: Diagnosis not present

## 2023-08-18 DIAGNOSIS — Z86711 Personal history of pulmonary embolism: Secondary | ICD-10-CM | POA: Diagnosis not present

## 2023-08-18 DIAGNOSIS — N183 Chronic kidney disease, stage 3 unspecified: Secondary | ICD-10-CM | POA: Diagnosis not present

## 2023-08-18 DIAGNOSIS — W19XXXD Unspecified fall, subsequent encounter: Secondary | ICD-10-CM | POA: Diagnosis not present

## 2023-08-18 DIAGNOSIS — M4856XD Collapsed vertebra, not elsewhere classified, lumbar region, subsequent encounter for fracture with routine healing: Secondary | ICD-10-CM | POA: Diagnosis not present

## 2023-08-18 DIAGNOSIS — I129 Hypertensive chronic kidney disease with stage 1 through stage 4 chronic kidney disease, or unspecified chronic kidney disease: Secondary | ICD-10-CM | POA: Diagnosis not present

## 2023-08-18 DIAGNOSIS — Z87440 Personal history of urinary (tract) infections: Secondary | ICD-10-CM | POA: Diagnosis not present

## 2023-08-18 DIAGNOSIS — K219 Gastro-esophageal reflux disease without esophagitis: Secondary | ICD-10-CM | POA: Diagnosis not present

## 2023-08-18 DIAGNOSIS — E785 Hyperlipidemia, unspecified: Secondary | ICD-10-CM | POA: Diagnosis not present

## 2023-08-18 DIAGNOSIS — Z8673 Personal history of transient ischemic attack (TIA), and cerebral infarction without residual deficits: Secondary | ICD-10-CM | POA: Diagnosis not present

## 2023-08-18 NOTE — Telephone Encounter (Signed)
 Verbal given to [place].

## 2023-08-22 DIAGNOSIS — E1140 Type 2 diabetes mellitus with diabetic neuropathy, unspecified: Secondary | ICD-10-CM | POA: Diagnosis not present

## 2023-08-22 DIAGNOSIS — Z8673 Personal history of transient ischemic attack (TIA), and cerebral infarction without residual deficits: Secondary | ICD-10-CM | POA: Diagnosis not present

## 2023-08-22 DIAGNOSIS — Z87440 Personal history of urinary (tract) infections: Secondary | ICD-10-CM | POA: Diagnosis not present

## 2023-08-22 DIAGNOSIS — E785 Hyperlipidemia, unspecified: Secondary | ICD-10-CM | POA: Diagnosis not present

## 2023-08-22 DIAGNOSIS — Z7902 Long term (current) use of antithrombotics/antiplatelets: Secondary | ICD-10-CM | POA: Diagnosis not present

## 2023-08-22 DIAGNOSIS — M4856XD Collapsed vertebra, not elsewhere classified, lumbar region, subsequent encounter for fracture with routine healing: Secondary | ICD-10-CM | POA: Diagnosis not present

## 2023-08-22 DIAGNOSIS — W19XXXD Unspecified fall, subsequent encounter: Secondary | ICD-10-CM | POA: Diagnosis not present

## 2023-08-22 DIAGNOSIS — N183 Chronic kidney disease, stage 3 unspecified: Secondary | ICD-10-CM | POA: Diagnosis not present

## 2023-08-22 DIAGNOSIS — E1122 Type 2 diabetes mellitus with diabetic chronic kidney disease: Secondary | ICD-10-CM | POA: Diagnosis not present

## 2023-08-22 DIAGNOSIS — K219 Gastro-esophageal reflux disease without esophagitis: Secondary | ICD-10-CM | POA: Diagnosis not present

## 2023-08-22 DIAGNOSIS — Z86711 Personal history of pulmonary embolism: Secondary | ICD-10-CM | POA: Diagnosis not present

## 2023-08-22 DIAGNOSIS — Z9181 History of falling: Secondary | ICD-10-CM | POA: Diagnosis not present

## 2023-08-22 DIAGNOSIS — I129 Hypertensive chronic kidney disease with stage 1 through stage 4 chronic kidney disease, or unspecified chronic kidney disease: Secondary | ICD-10-CM | POA: Diagnosis not present

## 2023-08-23 DIAGNOSIS — E1140 Type 2 diabetes mellitus with diabetic neuropathy, unspecified: Secondary | ICD-10-CM | POA: Diagnosis not present

## 2023-08-23 DIAGNOSIS — Z7902 Long term (current) use of antithrombotics/antiplatelets: Secondary | ICD-10-CM | POA: Diagnosis not present

## 2023-08-23 DIAGNOSIS — Z8673 Personal history of transient ischemic attack (TIA), and cerebral infarction without residual deficits: Secondary | ICD-10-CM | POA: Diagnosis not present

## 2023-08-23 DIAGNOSIS — I129 Hypertensive chronic kidney disease with stage 1 through stage 4 chronic kidney disease, or unspecified chronic kidney disease: Secondary | ICD-10-CM | POA: Diagnosis not present

## 2023-08-23 DIAGNOSIS — K219 Gastro-esophageal reflux disease without esophagitis: Secondary | ICD-10-CM | POA: Diagnosis not present

## 2023-08-23 DIAGNOSIS — W19XXXD Unspecified fall, subsequent encounter: Secondary | ICD-10-CM | POA: Diagnosis not present

## 2023-08-23 DIAGNOSIS — Z9181 History of falling: Secondary | ICD-10-CM | POA: Diagnosis not present

## 2023-08-23 DIAGNOSIS — E1122 Type 2 diabetes mellitus with diabetic chronic kidney disease: Secondary | ICD-10-CM | POA: Diagnosis not present

## 2023-08-23 DIAGNOSIS — M4856XD Collapsed vertebra, not elsewhere classified, lumbar region, subsequent encounter for fracture with routine healing: Secondary | ICD-10-CM | POA: Diagnosis not present

## 2023-08-23 DIAGNOSIS — N183 Chronic kidney disease, stage 3 unspecified: Secondary | ICD-10-CM | POA: Diagnosis not present

## 2023-08-23 DIAGNOSIS — E785 Hyperlipidemia, unspecified: Secondary | ICD-10-CM | POA: Diagnosis not present

## 2023-08-23 DIAGNOSIS — Z86711 Personal history of pulmonary embolism: Secondary | ICD-10-CM | POA: Diagnosis not present

## 2023-08-23 DIAGNOSIS — Z87440 Personal history of urinary (tract) infections: Secondary | ICD-10-CM | POA: Diagnosis not present

## 2023-08-25 DIAGNOSIS — K219 Gastro-esophageal reflux disease without esophagitis: Secondary | ICD-10-CM | POA: Diagnosis not present

## 2023-08-25 DIAGNOSIS — Z87440 Personal history of urinary (tract) infections: Secondary | ICD-10-CM | POA: Diagnosis not present

## 2023-08-25 DIAGNOSIS — W19XXXD Unspecified fall, subsequent encounter: Secondary | ICD-10-CM | POA: Diagnosis not present

## 2023-08-25 DIAGNOSIS — Z9181 History of falling: Secondary | ICD-10-CM | POA: Diagnosis not present

## 2023-08-25 DIAGNOSIS — E1140 Type 2 diabetes mellitus with diabetic neuropathy, unspecified: Secondary | ICD-10-CM | POA: Diagnosis not present

## 2023-08-25 DIAGNOSIS — I129 Hypertensive chronic kidney disease with stage 1 through stage 4 chronic kidney disease, or unspecified chronic kidney disease: Secondary | ICD-10-CM | POA: Diagnosis not present

## 2023-08-25 DIAGNOSIS — N183 Chronic kidney disease, stage 3 unspecified: Secondary | ICD-10-CM | POA: Diagnosis not present

## 2023-08-25 DIAGNOSIS — E1122 Type 2 diabetes mellitus with diabetic chronic kidney disease: Secondary | ICD-10-CM | POA: Diagnosis not present

## 2023-08-25 DIAGNOSIS — M4856XD Collapsed vertebra, not elsewhere classified, lumbar region, subsequent encounter for fracture with routine healing: Secondary | ICD-10-CM | POA: Diagnosis not present

## 2023-08-25 DIAGNOSIS — Z86711 Personal history of pulmonary embolism: Secondary | ICD-10-CM | POA: Diagnosis not present

## 2023-08-25 DIAGNOSIS — Z8673 Personal history of transient ischemic attack (TIA), and cerebral infarction without residual deficits: Secondary | ICD-10-CM | POA: Diagnosis not present

## 2023-08-25 DIAGNOSIS — Z7902 Long term (current) use of antithrombotics/antiplatelets: Secondary | ICD-10-CM | POA: Diagnosis not present

## 2023-08-25 DIAGNOSIS — R350 Frequency of micturition: Secondary | ICD-10-CM | POA: Diagnosis not present

## 2023-08-25 DIAGNOSIS — E785 Hyperlipidemia, unspecified: Secondary | ICD-10-CM | POA: Diagnosis not present

## 2023-08-29 DIAGNOSIS — K219 Gastro-esophageal reflux disease without esophagitis: Secondary | ICD-10-CM | POA: Diagnosis not present

## 2023-08-29 DIAGNOSIS — I129 Hypertensive chronic kidney disease with stage 1 through stage 4 chronic kidney disease, or unspecified chronic kidney disease: Secondary | ICD-10-CM | POA: Diagnosis not present

## 2023-08-29 DIAGNOSIS — Z86711 Personal history of pulmonary embolism: Secondary | ICD-10-CM | POA: Diagnosis not present

## 2023-08-29 DIAGNOSIS — E1140 Type 2 diabetes mellitus with diabetic neuropathy, unspecified: Secondary | ICD-10-CM | POA: Diagnosis not present

## 2023-08-29 DIAGNOSIS — Z8673 Personal history of transient ischemic attack (TIA), and cerebral infarction without residual deficits: Secondary | ICD-10-CM | POA: Diagnosis not present

## 2023-08-29 DIAGNOSIS — E1122 Type 2 diabetes mellitus with diabetic chronic kidney disease: Secondary | ICD-10-CM | POA: Diagnosis not present

## 2023-08-29 DIAGNOSIS — N183 Chronic kidney disease, stage 3 unspecified: Secondary | ICD-10-CM | POA: Diagnosis not present

## 2023-08-29 DIAGNOSIS — M4856XD Collapsed vertebra, not elsewhere classified, lumbar region, subsequent encounter for fracture with routine healing: Secondary | ICD-10-CM | POA: Diagnosis not present

## 2023-08-29 DIAGNOSIS — Z9181 History of falling: Secondary | ICD-10-CM | POA: Diagnosis not present

## 2023-08-29 DIAGNOSIS — E785 Hyperlipidemia, unspecified: Secondary | ICD-10-CM | POA: Diagnosis not present

## 2023-08-29 DIAGNOSIS — W19XXXD Unspecified fall, subsequent encounter: Secondary | ICD-10-CM | POA: Diagnosis not present

## 2023-08-29 DIAGNOSIS — Z7902 Long term (current) use of antithrombotics/antiplatelets: Secondary | ICD-10-CM | POA: Diagnosis not present

## 2023-08-29 DIAGNOSIS — Z87440 Personal history of urinary (tract) infections: Secondary | ICD-10-CM | POA: Diagnosis not present

## 2023-08-30 DIAGNOSIS — W19XXXD Unspecified fall, subsequent encounter: Secondary | ICD-10-CM | POA: Diagnosis not present

## 2023-08-30 DIAGNOSIS — Z7902 Long term (current) use of antithrombotics/antiplatelets: Secondary | ICD-10-CM | POA: Diagnosis not present

## 2023-08-30 DIAGNOSIS — E1140 Type 2 diabetes mellitus with diabetic neuropathy, unspecified: Secondary | ICD-10-CM | POA: Diagnosis not present

## 2023-08-30 DIAGNOSIS — Z86711 Personal history of pulmonary embolism: Secondary | ICD-10-CM | POA: Diagnosis not present

## 2023-08-30 DIAGNOSIS — I129 Hypertensive chronic kidney disease with stage 1 through stage 4 chronic kidney disease, or unspecified chronic kidney disease: Secondary | ICD-10-CM | POA: Diagnosis not present

## 2023-08-30 DIAGNOSIS — E1122 Type 2 diabetes mellitus with diabetic chronic kidney disease: Secondary | ICD-10-CM | POA: Diagnosis not present

## 2023-08-30 DIAGNOSIS — Z9181 History of falling: Secondary | ICD-10-CM | POA: Diagnosis not present

## 2023-08-30 DIAGNOSIS — K219 Gastro-esophageal reflux disease without esophagitis: Secondary | ICD-10-CM | POA: Diagnosis not present

## 2023-08-30 DIAGNOSIS — M4856XD Collapsed vertebra, not elsewhere classified, lumbar region, subsequent encounter for fracture with routine healing: Secondary | ICD-10-CM | POA: Diagnosis not present

## 2023-08-30 DIAGNOSIS — Z8673 Personal history of transient ischemic attack (TIA), and cerebral infarction without residual deficits: Secondary | ICD-10-CM | POA: Diagnosis not present

## 2023-08-30 DIAGNOSIS — E785 Hyperlipidemia, unspecified: Secondary | ICD-10-CM | POA: Diagnosis not present

## 2023-08-30 DIAGNOSIS — Z87440 Personal history of urinary (tract) infections: Secondary | ICD-10-CM | POA: Diagnosis not present

## 2023-08-30 DIAGNOSIS — N183 Chronic kidney disease, stage 3 unspecified: Secondary | ICD-10-CM | POA: Diagnosis not present

## 2023-09-04 DIAGNOSIS — K219 Gastro-esophageal reflux disease without esophagitis: Secondary | ICD-10-CM | POA: Diagnosis not present

## 2023-09-04 DIAGNOSIS — Z86711 Personal history of pulmonary embolism: Secondary | ICD-10-CM | POA: Diagnosis not present

## 2023-09-04 DIAGNOSIS — E785 Hyperlipidemia, unspecified: Secondary | ICD-10-CM | POA: Diagnosis not present

## 2023-09-04 DIAGNOSIS — E1140 Type 2 diabetes mellitus with diabetic neuropathy, unspecified: Secondary | ICD-10-CM | POA: Diagnosis not present

## 2023-09-04 DIAGNOSIS — I129 Hypertensive chronic kidney disease with stage 1 through stage 4 chronic kidney disease, or unspecified chronic kidney disease: Secondary | ICD-10-CM | POA: Diagnosis not present

## 2023-09-04 DIAGNOSIS — W19XXXD Unspecified fall, subsequent encounter: Secondary | ICD-10-CM | POA: Diagnosis not present

## 2023-09-04 DIAGNOSIS — N183 Chronic kidney disease, stage 3 unspecified: Secondary | ICD-10-CM | POA: Diagnosis not present

## 2023-09-04 DIAGNOSIS — Z87440 Personal history of urinary (tract) infections: Secondary | ICD-10-CM | POA: Diagnosis not present

## 2023-09-04 DIAGNOSIS — Z9181 History of falling: Secondary | ICD-10-CM | POA: Diagnosis not present

## 2023-09-04 DIAGNOSIS — Z7902 Long term (current) use of antithrombotics/antiplatelets: Secondary | ICD-10-CM | POA: Diagnosis not present

## 2023-09-04 DIAGNOSIS — E1122 Type 2 diabetes mellitus with diabetic chronic kidney disease: Secondary | ICD-10-CM | POA: Diagnosis not present

## 2023-09-04 DIAGNOSIS — Z8673 Personal history of transient ischemic attack (TIA), and cerebral infarction without residual deficits: Secondary | ICD-10-CM | POA: Diagnosis not present

## 2023-09-04 DIAGNOSIS — M4856XD Collapsed vertebra, not elsewhere classified, lumbar region, subsequent encounter for fracture with routine healing: Secondary | ICD-10-CM | POA: Diagnosis not present

## 2023-09-05 DIAGNOSIS — K219 Gastro-esophageal reflux disease without esophagitis: Secondary | ICD-10-CM | POA: Diagnosis not present

## 2023-09-05 DIAGNOSIS — I129 Hypertensive chronic kidney disease with stage 1 through stage 4 chronic kidney disease, or unspecified chronic kidney disease: Secondary | ICD-10-CM | POA: Diagnosis not present

## 2023-09-05 DIAGNOSIS — E785 Hyperlipidemia, unspecified: Secondary | ICD-10-CM | POA: Diagnosis not present

## 2023-09-05 DIAGNOSIS — E1122 Type 2 diabetes mellitus with diabetic chronic kidney disease: Secondary | ICD-10-CM | POA: Diagnosis not present

## 2023-09-05 DIAGNOSIS — M4856XD Collapsed vertebra, not elsewhere classified, lumbar region, subsequent encounter for fracture with routine healing: Secondary | ICD-10-CM | POA: Diagnosis not present

## 2023-09-05 DIAGNOSIS — W19XXXD Unspecified fall, subsequent encounter: Secondary | ICD-10-CM | POA: Diagnosis not present

## 2023-09-05 DIAGNOSIS — E1140 Type 2 diabetes mellitus with diabetic neuropathy, unspecified: Secondary | ICD-10-CM | POA: Diagnosis not present

## 2023-09-05 DIAGNOSIS — N183 Chronic kidney disease, stage 3 unspecified: Secondary | ICD-10-CM | POA: Diagnosis not present

## 2023-09-05 DIAGNOSIS — Z8673 Personal history of transient ischemic attack (TIA), and cerebral infarction without residual deficits: Secondary | ICD-10-CM | POA: Diagnosis not present

## 2023-09-05 DIAGNOSIS — Z87440 Personal history of urinary (tract) infections: Secondary | ICD-10-CM | POA: Diagnosis not present

## 2023-09-05 DIAGNOSIS — Z7902 Long term (current) use of antithrombotics/antiplatelets: Secondary | ICD-10-CM | POA: Diagnosis not present

## 2023-09-05 DIAGNOSIS — Z86711 Personal history of pulmonary embolism: Secondary | ICD-10-CM | POA: Diagnosis not present

## 2023-09-05 DIAGNOSIS — Z9181 History of falling: Secondary | ICD-10-CM | POA: Diagnosis not present

## 2023-09-06 DIAGNOSIS — S32020G Wedge compression fracture of second lumbar vertebra, subsequent encounter for fracture with delayed healing: Secondary | ICD-10-CM | POA: Diagnosis not present

## 2023-09-06 DIAGNOSIS — S32020A Wedge compression fracture of second lumbar vertebra, initial encounter for closed fracture: Secondary | ICD-10-CM | POA: Diagnosis not present

## 2023-09-08 ENCOUNTER — Telehealth: Payer: Self-pay

## 2023-09-08 NOTE — Telephone Encounter (Signed)
 Yes, it is ok to stop Plavix  a week before procedure. Thanks, BJ

## 2023-09-08 NOTE — Telephone Encounter (Signed)
 Copied from CRM [id number]. Topic: Clinical - Medication Question >> Sep 08, 2023  8:52 AM [place] wrote: Reason for CRM: Patient is getting a procedure done on her back in 2 weeks and the doctor doing the procedure wants to know if she can come off of the Plavix  1 week prior to the procedure, please contact as soon as possible

## 2023-09-08 NOTE — Telephone Encounter (Signed)
I spoke with pt's daughter, she is aware of message below.  

## 2023-09-11 DIAGNOSIS — Z87440 Personal history of urinary (tract) infections: Secondary | ICD-10-CM | POA: Diagnosis not present

## 2023-09-11 DIAGNOSIS — K219 Gastro-esophageal reflux disease without esophagitis: Secondary | ICD-10-CM | POA: Diagnosis not present

## 2023-09-11 DIAGNOSIS — Z7902 Long term (current) use of antithrombotics/antiplatelets: Secondary | ICD-10-CM | POA: Diagnosis not present

## 2023-09-11 DIAGNOSIS — E1122 Type 2 diabetes mellitus with diabetic chronic kidney disease: Secondary | ICD-10-CM | POA: Diagnosis not present

## 2023-09-11 DIAGNOSIS — W19XXXD Unspecified fall, subsequent encounter: Secondary | ICD-10-CM | POA: Diagnosis not present

## 2023-09-11 DIAGNOSIS — Z9181 History of falling: Secondary | ICD-10-CM | POA: Diagnosis not present

## 2023-09-11 DIAGNOSIS — N183 Chronic kidney disease, stage 3 unspecified: Secondary | ICD-10-CM | POA: Diagnosis not present

## 2023-09-11 DIAGNOSIS — E785 Hyperlipidemia, unspecified: Secondary | ICD-10-CM | POA: Diagnosis not present

## 2023-09-11 DIAGNOSIS — E1140 Type 2 diabetes mellitus with diabetic neuropathy, unspecified: Secondary | ICD-10-CM | POA: Diagnosis not present

## 2023-09-11 DIAGNOSIS — I129 Hypertensive chronic kidney disease with stage 1 through stage 4 chronic kidney disease, or unspecified chronic kidney disease: Secondary | ICD-10-CM | POA: Diagnosis not present

## 2023-09-11 DIAGNOSIS — Z86711 Personal history of pulmonary embolism: Secondary | ICD-10-CM | POA: Diagnosis not present

## 2023-09-11 DIAGNOSIS — M4856XD Collapsed vertebra, not elsewhere classified, lumbar region, subsequent encounter for fracture with routine healing: Secondary | ICD-10-CM | POA: Diagnosis not present

## 2023-09-11 DIAGNOSIS — Z8673 Personal history of transient ischemic attack (TIA), and cerebral infarction without residual deficits: Secondary | ICD-10-CM | POA: Diagnosis not present

## 2023-09-15 DIAGNOSIS — W19XXXD Unspecified fall, subsequent encounter: Secondary | ICD-10-CM | POA: Diagnosis not present

## 2023-09-15 DIAGNOSIS — Z8673 Personal history of transient ischemic attack (TIA), and cerebral infarction without residual deficits: Secondary | ICD-10-CM | POA: Diagnosis not present

## 2023-09-15 DIAGNOSIS — K219 Gastro-esophageal reflux disease without esophagitis: Secondary | ICD-10-CM | POA: Diagnosis not present

## 2023-09-15 DIAGNOSIS — I129 Hypertensive chronic kidney disease with stage 1 through stage 4 chronic kidney disease, or unspecified chronic kidney disease: Secondary | ICD-10-CM | POA: Diagnosis not present

## 2023-09-15 DIAGNOSIS — E1140 Type 2 diabetes mellitus with diabetic neuropathy, unspecified: Secondary | ICD-10-CM | POA: Diagnosis not present

## 2023-09-15 DIAGNOSIS — Z9181 History of falling: Secondary | ICD-10-CM | POA: Diagnosis not present

## 2023-09-15 DIAGNOSIS — E1122 Type 2 diabetes mellitus with diabetic chronic kidney disease: Secondary | ICD-10-CM | POA: Diagnosis not present

## 2023-09-15 DIAGNOSIS — N183 Chronic kidney disease, stage 3 unspecified: Secondary | ICD-10-CM | POA: Diagnosis not present

## 2023-09-15 DIAGNOSIS — Z7902 Long term (current) use of antithrombotics/antiplatelets: Secondary | ICD-10-CM | POA: Diagnosis not present

## 2023-09-15 DIAGNOSIS — M4856XD Collapsed vertebra, not elsewhere classified, lumbar region, subsequent encounter for fracture with routine healing: Secondary | ICD-10-CM | POA: Diagnosis not present

## 2023-09-15 DIAGNOSIS — Z87440 Personal history of urinary (tract) infections: Secondary | ICD-10-CM | POA: Diagnosis not present

## 2023-09-15 DIAGNOSIS — Z86711 Personal history of pulmonary embolism: Secondary | ICD-10-CM | POA: Diagnosis not present

## 2023-09-15 DIAGNOSIS — E785 Hyperlipidemia, unspecified: Secondary | ICD-10-CM | POA: Diagnosis not present

## 2023-09-19 DIAGNOSIS — W19XXXD Unspecified fall, subsequent encounter: Secondary | ICD-10-CM | POA: Diagnosis not present

## 2023-09-19 DIAGNOSIS — Z8673 Personal history of transient ischemic attack (TIA), and cerebral infarction without residual deficits: Secondary | ICD-10-CM | POA: Diagnosis not present

## 2023-09-19 DIAGNOSIS — M4856XD Collapsed vertebra, not elsewhere classified, lumbar region, subsequent encounter for fracture with routine healing: Secondary | ICD-10-CM | POA: Diagnosis not present

## 2023-09-19 DIAGNOSIS — K219 Gastro-esophageal reflux disease without esophagitis: Secondary | ICD-10-CM | POA: Diagnosis not present

## 2023-09-19 DIAGNOSIS — Z86711 Personal history of pulmonary embolism: Secondary | ICD-10-CM | POA: Diagnosis not present

## 2023-09-19 DIAGNOSIS — N183 Chronic kidney disease, stage 3 unspecified: Secondary | ICD-10-CM | POA: Diagnosis not present

## 2023-09-19 DIAGNOSIS — E785 Hyperlipidemia, unspecified: Secondary | ICD-10-CM | POA: Diagnosis not present

## 2023-09-19 DIAGNOSIS — E1122 Type 2 diabetes mellitus with diabetic chronic kidney disease: Secondary | ICD-10-CM | POA: Diagnosis not present

## 2023-09-19 DIAGNOSIS — E1140 Type 2 diabetes mellitus with diabetic neuropathy, unspecified: Secondary | ICD-10-CM | POA: Diagnosis not present

## 2023-09-19 DIAGNOSIS — Z9181 History of falling: Secondary | ICD-10-CM | POA: Diagnosis not present

## 2023-09-19 DIAGNOSIS — Z7902 Long term (current) use of antithrombotics/antiplatelets: Secondary | ICD-10-CM | POA: Diagnosis not present

## 2023-09-19 DIAGNOSIS — Z87440 Personal history of urinary (tract) infections: Secondary | ICD-10-CM | POA: Diagnosis not present

## 2023-09-19 DIAGNOSIS — I129 Hypertensive chronic kidney disease with stage 1 through stage 4 chronic kidney disease, or unspecified chronic kidney disease: Secondary | ICD-10-CM | POA: Diagnosis not present

## 2023-09-21 DIAGNOSIS — S32020A Wedge compression fracture of second lumbar vertebra, initial encounter for closed fracture: Secondary | ICD-10-CM | POA: Diagnosis not present

## 2023-09-21 DIAGNOSIS — M8008XG Age-related osteoporosis with current pathological fracture, vertebra(e), subsequent encounter for fracture with delayed healing: Secondary | ICD-10-CM | POA: Diagnosis not present

## 2023-09-21 DIAGNOSIS — M4716 Other spondylosis with myelopathy, lumbar region: Secondary | ICD-10-CM | POA: Diagnosis not present

## 2023-09-21 DIAGNOSIS — M8008XA Age-related osteoporosis with current pathological fracture, vertebra(e), initial encounter for fracture: Secondary | ICD-10-CM | POA: Diagnosis not present

## 2023-09-25 DIAGNOSIS — E1122 Type 2 diabetes mellitus with diabetic chronic kidney disease: Secondary | ICD-10-CM | POA: Diagnosis not present

## 2023-09-25 DIAGNOSIS — Z7902 Long term (current) use of antithrombotics/antiplatelets: Secondary | ICD-10-CM | POA: Diagnosis not present

## 2023-09-25 DIAGNOSIS — Z87440 Personal history of urinary (tract) infections: Secondary | ICD-10-CM | POA: Diagnosis not present

## 2023-09-25 DIAGNOSIS — Z86711 Personal history of pulmonary embolism: Secondary | ICD-10-CM | POA: Diagnosis not present

## 2023-09-25 DIAGNOSIS — I129 Hypertensive chronic kidney disease with stage 1 through stage 4 chronic kidney disease, or unspecified chronic kidney disease: Secondary | ICD-10-CM | POA: Diagnosis not present

## 2023-09-25 DIAGNOSIS — E785 Hyperlipidemia, unspecified: Secondary | ICD-10-CM | POA: Diagnosis not present

## 2023-09-25 DIAGNOSIS — Z8673 Personal history of transient ischemic attack (TIA), and cerebral infarction without residual deficits: Secondary | ICD-10-CM | POA: Diagnosis not present

## 2023-09-25 DIAGNOSIS — W19XXXD Unspecified fall, subsequent encounter: Secondary | ICD-10-CM | POA: Diagnosis not present

## 2023-09-25 DIAGNOSIS — Z9181 History of falling: Secondary | ICD-10-CM | POA: Diagnosis not present

## 2023-09-25 DIAGNOSIS — E1140 Type 2 diabetes mellitus with diabetic neuropathy, unspecified: Secondary | ICD-10-CM | POA: Diagnosis not present

## 2023-09-25 DIAGNOSIS — M4856XD Collapsed vertebra, not elsewhere classified, lumbar region, subsequent encounter for fracture with routine healing: Secondary | ICD-10-CM | POA: Diagnosis not present

## 2023-09-25 DIAGNOSIS — N183 Chronic kidney disease, stage 3 unspecified: Secondary | ICD-10-CM | POA: Diagnosis not present

## 2023-09-25 DIAGNOSIS — K219 Gastro-esophageal reflux disease without esophagitis: Secondary | ICD-10-CM | POA: Diagnosis not present

## 2023-09-26 DIAGNOSIS — M4856XD Collapsed vertebra, not elsewhere classified, lumbar region, subsequent encounter for fracture with routine healing: Secondary | ICD-10-CM | POA: Diagnosis not present

## 2023-09-26 DIAGNOSIS — Z8673 Personal history of transient ischemic attack (TIA), and cerebral infarction without residual deficits: Secondary | ICD-10-CM | POA: Diagnosis not present

## 2023-09-26 DIAGNOSIS — Z87440 Personal history of urinary (tract) infections: Secondary | ICD-10-CM | POA: Diagnosis not present

## 2023-09-26 DIAGNOSIS — Z9181 History of falling: Secondary | ICD-10-CM | POA: Diagnosis not present

## 2023-09-26 DIAGNOSIS — E1140 Type 2 diabetes mellitus with diabetic neuropathy, unspecified: Secondary | ICD-10-CM | POA: Diagnosis not present

## 2023-09-26 DIAGNOSIS — E1122 Type 2 diabetes mellitus with diabetic chronic kidney disease: Secondary | ICD-10-CM | POA: Diagnosis not present

## 2023-09-26 DIAGNOSIS — Z86711 Personal history of pulmonary embolism: Secondary | ICD-10-CM | POA: Diagnosis not present

## 2023-09-26 DIAGNOSIS — E785 Hyperlipidemia, unspecified: Secondary | ICD-10-CM | POA: Diagnosis not present

## 2023-09-26 DIAGNOSIS — Z7902 Long term (current) use of antithrombotics/antiplatelets: Secondary | ICD-10-CM | POA: Diagnosis not present

## 2023-09-26 DIAGNOSIS — N183 Chronic kidney disease, stage 3 unspecified: Secondary | ICD-10-CM | POA: Diagnosis not present

## 2023-09-26 DIAGNOSIS — I129 Hypertensive chronic kidney disease with stage 1 through stage 4 chronic kidney disease, or unspecified chronic kidney disease: Secondary | ICD-10-CM | POA: Diagnosis not present

## 2023-09-26 DIAGNOSIS — W19XXXD Unspecified fall, subsequent encounter: Secondary | ICD-10-CM | POA: Diagnosis not present

## 2023-09-26 DIAGNOSIS — K219 Gastro-esophageal reflux disease without esophagitis: Secondary | ICD-10-CM | POA: Diagnosis not present

## 2023-10-04 DIAGNOSIS — Z8673 Personal history of transient ischemic attack (TIA), and cerebral infarction without residual deficits: Secondary | ICD-10-CM | POA: Diagnosis not present

## 2023-10-04 DIAGNOSIS — I129 Hypertensive chronic kidney disease with stage 1 through stage 4 chronic kidney disease, or unspecified chronic kidney disease: Secondary | ICD-10-CM | POA: Diagnosis not present

## 2023-10-04 DIAGNOSIS — N183 Chronic kidney disease, stage 3 unspecified: Secondary | ICD-10-CM | POA: Diagnosis not present

## 2023-10-04 DIAGNOSIS — K219 Gastro-esophageal reflux disease without esophagitis: Secondary | ICD-10-CM | POA: Diagnosis not present

## 2023-10-04 DIAGNOSIS — M4856XD Collapsed vertebra, not elsewhere classified, lumbar region, subsequent encounter for fracture with routine healing: Secondary | ICD-10-CM | POA: Diagnosis not present

## 2023-10-04 DIAGNOSIS — W19XXXD Unspecified fall, subsequent encounter: Secondary | ICD-10-CM | POA: Diagnosis not present

## 2023-10-04 DIAGNOSIS — Z87440 Personal history of urinary (tract) infections: Secondary | ICD-10-CM | POA: Diagnosis not present

## 2023-10-04 DIAGNOSIS — E785 Hyperlipidemia, unspecified: Secondary | ICD-10-CM | POA: Diagnosis not present

## 2023-10-04 DIAGNOSIS — E1122 Type 2 diabetes mellitus with diabetic chronic kidney disease: Secondary | ICD-10-CM | POA: Diagnosis not present

## 2023-10-04 DIAGNOSIS — E1140 Type 2 diabetes mellitus with diabetic neuropathy, unspecified: Secondary | ICD-10-CM | POA: Diagnosis not present

## 2023-10-04 DIAGNOSIS — Z86711 Personal history of pulmonary embolism: Secondary | ICD-10-CM | POA: Diagnosis not present

## 2023-10-04 DIAGNOSIS — Z9181 History of falling: Secondary | ICD-10-CM | POA: Diagnosis not present

## 2023-10-04 DIAGNOSIS — Z7902 Long term (current) use of antithrombotics/antiplatelets: Secondary | ICD-10-CM | POA: Diagnosis not present

## 2023-10-05 DIAGNOSIS — R350 Frequency of micturition: Secondary | ICD-10-CM | POA: Diagnosis not present

## 2023-10-09 DIAGNOSIS — Z7902 Long term (current) use of antithrombotics/antiplatelets: Secondary | ICD-10-CM | POA: Diagnosis not present

## 2023-10-09 DIAGNOSIS — M4856XD Collapsed vertebra, not elsewhere classified, lumbar region, subsequent encounter for fracture with routine healing: Secondary | ICD-10-CM | POA: Diagnosis not present

## 2023-10-09 DIAGNOSIS — N183 Chronic kidney disease, stage 3 unspecified: Secondary | ICD-10-CM | POA: Diagnosis not present

## 2023-10-09 DIAGNOSIS — K219 Gastro-esophageal reflux disease without esophagitis: Secondary | ICD-10-CM | POA: Diagnosis not present

## 2023-10-09 DIAGNOSIS — Z87440 Personal history of urinary (tract) infections: Secondary | ICD-10-CM | POA: Diagnosis not present

## 2023-10-09 DIAGNOSIS — I129 Hypertensive chronic kidney disease with stage 1 through stage 4 chronic kidney disease, or unspecified chronic kidney disease: Secondary | ICD-10-CM | POA: Diagnosis not present

## 2023-10-09 DIAGNOSIS — W19XXXD Unspecified fall, subsequent encounter: Secondary | ICD-10-CM | POA: Diagnosis not present

## 2023-10-09 DIAGNOSIS — E785 Hyperlipidemia, unspecified: Secondary | ICD-10-CM | POA: Diagnosis not present

## 2023-10-09 DIAGNOSIS — Z9181 History of falling: Secondary | ICD-10-CM | POA: Diagnosis not present

## 2023-10-09 DIAGNOSIS — E1122 Type 2 diabetes mellitus with diabetic chronic kidney disease: Secondary | ICD-10-CM | POA: Diagnosis not present

## 2023-10-09 DIAGNOSIS — Z86711 Personal history of pulmonary embolism: Secondary | ICD-10-CM | POA: Diagnosis not present

## 2023-10-09 DIAGNOSIS — E1140 Type 2 diabetes mellitus with diabetic neuropathy, unspecified: Secondary | ICD-10-CM | POA: Diagnosis not present

## 2023-10-09 DIAGNOSIS — Z8673 Personal history of transient ischemic attack (TIA), and cerebral infarction without residual deficits: Secondary | ICD-10-CM | POA: Diagnosis not present

## 2023-10-10 DIAGNOSIS — E785 Hyperlipidemia, unspecified: Secondary | ICD-10-CM | POA: Diagnosis not present

## 2023-10-10 DIAGNOSIS — Z86711 Personal history of pulmonary embolism: Secondary | ICD-10-CM | POA: Diagnosis not present

## 2023-10-10 DIAGNOSIS — W19XXXD Unspecified fall, subsequent encounter: Secondary | ICD-10-CM | POA: Diagnosis not present

## 2023-10-10 DIAGNOSIS — Z9181 History of falling: Secondary | ICD-10-CM | POA: Diagnosis not present

## 2023-10-10 DIAGNOSIS — I129 Hypertensive chronic kidney disease with stage 1 through stage 4 chronic kidney disease, or unspecified chronic kidney disease: Secondary | ICD-10-CM | POA: Diagnosis not present

## 2023-10-10 DIAGNOSIS — Z7902 Long term (current) use of antithrombotics/antiplatelets: Secondary | ICD-10-CM | POA: Diagnosis not present

## 2023-10-10 DIAGNOSIS — N183 Chronic kidney disease, stage 3 unspecified: Secondary | ICD-10-CM | POA: Diagnosis not present

## 2023-10-10 DIAGNOSIS — Z8673 Personal history of transient ischemic attack (TIA), and cerebral infarction without residual deficits: Secondary | ICD-10-CM | POA: Diagnosis not present

## 2023-10-10 DIAGNOSIS — M4856XD Collapsed vertebra, not elsewhere classified, lumbar region, subsequent encounter for fracture with routine healing: Secondary | ICD-10-CM | POA: Diagnosis not present

## 2023-10-10 DIAGNOSIS — E1122 Type 2 diabetes mellitus with diabetic chronic kidney disease: Secondary | ICD-10-CM | POA: Diagnosis not present

## 2023-10-10 DIAGNOSIS — Z87440 Personal history of urinary (tract) infections: Secondary | ICD-10-CM | POA: Diagnosis not present

## 2023-10-10 DIAGNOSIS — K219 Gastro-esophageal reflux disease without esophagitis: Secondary | ICD-10-CM | POA: Diagnosis not present

## 2023-10-10 DIAGNOSIS — E1140 Type 2 diabetes mellitus with diabetic neuropathy, unspecified: Secondary | ICD-10-CM | POA: Diagnosis not present

## 2023-10-11 ENCOUNTER — Other Ambulatory Visit: Payer: Self-pay | Admitting: Family Medicine

## 2023-10-11 ENCOUNTER — Ambulatory Visit (INDEPENDENT_AMBULATORY_CARE_PROVIDER_SITE_OTHER): Admitting: Podiatry

## 2023-10-11 ENCOUNTER — Encounter: Payer: Self-pay | Admitting: Podiatry

## 2023-10-11 DIAGNOSIS — M79675 Pain in left toe(s): Secondary | ICD-10-CM

## 2023-10-11 DIAGNOSIS — M79674 Pain in right toe(s): Secondary | ICD-10-CM | POA: Diagnosis not present

## 2023-10-11 DIAGNOSIS — I674 Hypertensive encephalopathy: Secondary | ICD-10-CM

## 2023-10-11 DIAGNOSIS — E1142 Type 2 diabetes mellitus with diabetic polyneuropathy: Secondary | ICD-10-CM

## 2023-10-11 DIAGNOSIS — L84 Corns and callosities: Secondary | ICD-10-CM

## 2023-10-11 DIAGNOSIS — B351 Tinea unguium: Secondary | ICD-10-CM

## 2023-10-11 DIAGNOSIS — M8008XG Age-related osteoporosis with current pathological fracture, vertebra(e), subsequent encounter for fracture with delayed healing: Secondary | ICD-10-CM | POA: Diagnosis not present

## 2023-10-16 DIAGNOSIS — E785 Hyperlipidemia, unspecified: Secondary | ICD-10-CM | POA: Diagnosis not present

## 2023-10-16 DIAGNOSIS — Z86711 Personal history of pulmonary embolism: Secondary | ICD-10-CM | POA: Diagnosis not present

## 2023-10-16 DIAGNOSIS — K219 Gastro-esophageal reflux disease without esophagitis: Secondary | ICD-10-CM | POA: Diagnosis not present

## 2023-10-16 DIAGNOSIS — Z7902 Long term (current) use of antithrombotics/antiplatelets: Secondary | ICD-10-CM | POA: Diagnosis not present

## 2023-10-16 DIAGNOSIS — Z9181 History of falling: Secondary | ICD-10-CM | POA: Diagnosis not present

## 2023-10-16 DIAGNOSIS — N183 Chronic kidney disease, stage 3 unspecified: Secondary | ICD-10-CM | POA: Diagnosis not present

## 2023-10-16 DIAGNOSIS — E1140 Type 2 diabetes mellitus with diabetic neuropathy, unspecified: Secondary | ICD-10-CM | POA: Diagnosis not present

## 2023-10-16 DIAGNOSIS — E1122 Type 2 diabetes mellitus with diabetic chronic kidney disease: Secondary | ICD-10-CM | POA: Diagnosis not present

## 2023-10-16 DIAGNOSIS — W19XXXD Unspecified fall, subsequent encounter: Secondary | ICD-10-CM | POA: Diagnosis not present

## 2023-10-16 DIAGNOSIS — I129 Hypertensive chronic kidney disease with stage 1 through stage 4 chronic kidney disease, or unspecified chronic kidney disease: Secondary | ICD-10-CM | POA: Diagnosis not present

## 2023-10-16 DIAGNOSIS — Z8673 Personal history of transient ischemic attack (TIA), and cerebral infarction without residual deficits: Secondary | ICD-10-CM | POA: Diagnosis not present

## 2023-10-16 DIAGNOSIS — Z87440 Personal history of urinary (tract) infections: Secondary | ICD-10-CM | POA: Diagnosis not present

## 2023-10-16 DIAGNOSIS — M4856XD Collapsed vertebra, not elsewhere classified, lumbar region, subsequent encounter for fracture with routine healing: Secondary | ICD-10-CM | POA: Diagnosis not present

## 2023-10-16 NOTE — Progress Notes (Signed)
  Subjective:  Patient ID: Alexa Brown, female    DOB: 09-Apr-1940,  MRN: [id number]  Alexa Brown presents to clinic today for at risk foot care with history of diabetic neuropathy and callus(es) right foot and painful mycotic toenails that are difficult to trim. Painful toenails interfere with ambulation. Aggravating factors include wearing enclosed shoe gear. Pain is relieved with periodic professional debridement. Painful calluses are aggravated when weightbearing with and without shoegear. Pain is relieved with periodic professional debridement.  She is accompanied by her daughter on today's visit. Chief Complaint  Patient presents with   RFc    Rm15 RFC/ Dr. [place]   New problem(s): None.   PCP is [place], [place] G, MD. Alexa Brown 08/09/2023.  No Known Allergies  Review of Systems: Negative except as noted in the HPI.  Objective: No changes noted in today's physical examination. There were no vitals filed for this visit. Alexa Brown is a pleasant 83 y.o. female WD, WN in NAD. AAO x 3.  Vascular Examination: Capillary refill time immediate b/l. Vascular status intact b/l with palpable pedal pulses. Pedal hair present b/l. No pain with calf compression b/l. Skin temperature gradient WNL b/l. No cyanosis or clubbing b/l. No ischemia or gangrene noted b/l. No edema noted b/l LE.  Neurological Examination: Sensation grossly intact b/l with 10 gram monofilament. Vibratory sensation intact b/l. Pt has subjective symptoms of neuropathy.  Dermatological Examination: Pedal skin with normal turgor, texture and tone b/l.  No open wounds. No interdigital macerations.   Toenails 1-5 b/l thick, discolored, elongated with subungual debris and pain on dorsal palpation.   Hyperkeratotic lesion(s) medial IPJ of right great toe.  No erythema, no edema, no drainage, no fluctuance.  Musculoskeletal Examination: Normal muscle strength 5/5 to all lower extremity muscle groups  bilaterally. HAV with bunion deformity noted b/l LE.Alexa Brown No pain, crepitus or joint limitation noted with ROM b/l LE.  Patient ambulates independently without assistive aids.  Radiographs: None  Last A1c:      Latest Ref Rng & Units 07/26/2023    9:00 AM 01/02/2023    9:04 AM  Hemoglobin A1C  Hemoglobin-A1c 5.7 - 6.4 % 6.0  6.3    Assessment/Plan: 1. Pain due to onychomycosis of toenails of both feet   2. Callus   3. Diabetic peripheral neuropathy associated with type 2 diabetes mellitus (HCC)     Patient was evaluated and treated. All patient's and/or POA's questions/concerns addressed on today's visit. Toenails 1-5 debrided in length and girth without incident. Callus(es) medial IPJ of right great toe was gently filed without incident. Treatment was provided by assistant Alexa Brown under my supervision. Continue daily foot inspections and monitor blood glucose per PCP/Endocrinologist's recommendations. Continue soft, supportive shoe gear daily. Report any pedal injuries to medical professional. Call office if there are any questions/concerns.  Return in about 3 months (around 01/11/2024).  Alexa Brown, DPM      [place] LOCATION: [address], [place] [address]                   Office [phone]   [facility] LOCATION: [address] [place], [place] [address] Office [phone]

## 2023-10-23 DIAGNOSIS — M4856XD Collapsed vertebra, not elsewhere classified, lumbar region, subsequent encounter for fracture with routine healing: Secondary | ICD-10-CM | POA: Diagnosis not present

## 2023-10-23 DIAGNOSIS — Z7902 Long term (current) use of antithrombotics/antiplatelets: Secondary | ICD-10-CM | POA: Diagnosis not present

## 2023-10-23 DIAGNOSIS — N183 Chronic kidney disease, stage 3 unspecified: Secondary | ICD-10-CM | POA: Diagnosis not present

## 2023-10-23 DIAGNOSIS — W19XXXD Unspecified fall, subsequent encounter: Secondary | ICD-10-CM | POA: Diagnosis not present

## 2023-10-23 DIAGNOSIS — Z8673 Personal history of transient ischemic attack (TIA), and cerebral infarction without residual deficits: Secondary | ICD-10-CM | POA: Diagnosis not present

## 2023-10-23 DIAGNOSIS — Z87440 Personal history of urinary (tract) infections: Secondary | ICD-10-CM | POA: Diagnosis not present

## 2023-10-23 DIAGNOSIS — E785 Hyperlipidemia, unspecified: Secondary | ICD-10-CM | POA: Diagnosis not present

## 2023-10-23 DIAGNOSIS — K219 Gastro-esophageal reflux disease without esophagitis: Secondary | ICD-10-CM | POA: Diagnosis not present

## 2023-10-23 DIAGNOSIS — E1122 Type 2 diabetes mellitus with diabetic chronic kidney disease: Secondary | ICD-10-CM | POA: Diagnosis not present

## 2023-10-23 DIAGNOSIS — Z9181 History of falling: Secondary | ICD-10-CM | POA: Diagnosis not present

## 2023-10-23 DIAGNOSIS — E1140 Type 2 diabetes mellitus with diabetic neuropathy, unspecified: Secondary | ICD-10-CM | POA: Diagnosis not present

## 2023-10-23 DIAGNOSIS — I129 Hypertensive chronic kidney disease with stage 1 through stage 4 chronic kidney disease, or unspecified chronic kidney disease: Secondary | ICD-10-CM | POA: Diagnosis not present

## 2023-10-23 DIAGNOSIS — Z86711 Personal history of pulmonary embolism: Secondary | ICD-10-CM | POA: Diagnosis not present

## 2023-10-30 DIAGNOSIS — Z87440 Personal history of urinary (tract) infections: Secondary | ICD-10-CM | POA: Diagnosis not present

## 2023-10-30 DIAGNOSIS — I129 Hypertensive chronic kidney disease with stage 1 through stage 4 chronic kidney disease, or unspecified chronic kidney disease: Secondary | ICD-10-CM | POA: Diagnosis not present

## 2023-10-30 DIAGNOSIS — M4856XD Collapsed vertebra, not elsewhere classified, lumbar region, subsequent encounter for fracture with routine healing: Secondary | ICD-10-CM | POA: Diagnosis not present

## 2023-10-30 DIAGNOSIS — Z9181 History of falling: Secondary | ICD-10-CM | POA: Diagnosis not present

## 2023-10-30 DIAGNOSIS — W19XXXD Unspecified fall, subsequent encounter: Secondary | ICD-10-CM | POA: Diagnosis not present

## 2023-10-30 DIAGNOSIS — N183 Chronic kidney disease, stage 3 unspecified: Secondary | ICD-10-CM | POA: Diagnosis not present

## 2023-10-30 DIAGNOSIS — Z86711 Personal history of pulmonary embolism: Secondary | ICD-10-CM | POA: Diagnosis not present

## 2023-10-30 DIAGNOSIS — Z7902 Long term (current) use of antithrombotics/antiplatelets: Secondary | ICD-10-CM | POA: Diagnosis not present

## 2023-10-30 DIAGNOSIS — E1122 Type 2 diabetes mellitus with diabetic chronic kidney disease: Secondary | ICD-10-CM | POA: Diagnosis not present

## 2023-10-30 DIAGNOSIS — E1140 Type 2 diabetes mellitus with diabetic neuropathy, unspecified: Secondary | ICD-10-CM | POA: Diagnosis not present

## 2023-10-30 DIAGNOSIS — Z8673 Personal history of transient ischemic attack (TIA), and cerebral infarction without residual deficits: Secondary | ICD-10-CM | POA: Diagnosis not present

## 2023-10-30 DIAGNOSIS — K219 Gastro-esophageal reflux disease without esophagitis: Secondary | ICD-10-CM | POA: Diagnosis not present

## 2023-10-30 DIAGNOSIS — E785 Hyperlipidemia, unspecified: Secondary | ICD-10-CM | POA: Diagnosis not present

## 2023-11-02 DIAGNOSIS — R350 Frequency of micturition: Secondary | ICD-10-CM | POA: Diagnosis not present

## 2023-11-06 DIAGNOSIS — N183 Chronic kidney disease, stage 3 unspecified: Secondary | ICD-10-CM | POA: Diagnosis not present

## 2023-11-06 DIAGNOSIS — E1140 Type 2 diabetes mellitus with diabetic neuropathy, unspecified: Secondary | ICD-10-CM | POA: Diagnosis not present

## 2023-11-06 DIAGNOSIS — Z86711 Personal history of pulmonary embolism: Secondary | ICD-10-CM | POA: Diagnosis not present

## 2023-11-06 DIAGNOSIS — I129 Hypertensive chronic kidney disease with stage 1 through stage 4 chronic kidney disease, or unspecified chronic kidney disease: Secondary | ICD-10-CM | POA: Diagnosis not present

## 2023-11-06 DIAGNOSIS — Z87440 Personal history of urinary (tract) infections: Secondary | ICD-10-CM | POA: Diagnosis not present

## 2023-11-06 DIAGNOSIS — Z7902 Long term (current) use of antithrombotics/antiplatelets: Secondary | ICD-10-CM | POA: Diagnosis not present

## 2023-11-06 DIAGNOSIS — Z8673 Personal history of transient ischemic attack (TIA), and cerebral infarction without residual deficits: Secondary | ICD-10-CM | POA: Diagnosis not present

## 2023-11-06 DIAGNOSIS — K219 Gastro-esophageal reflux disease without esophagitis: Secondary | ICD-10-CM | POA: Diagnosis not present

## 2023-11-06 DIAGNOSIS — M4856XD Collapsed vertebra, not elsewhere classified, lumbar region, subsequent encounter for fracture with routine healing: Secondary | ICD-10-CM | POA: Diagnosis not present

## 2023-11-06 DIAGNOSIS — E1122 Type 2 diabetes mellitus with diabetic chronic kidney disease: Secondary | ICD-10-CM | POA: Diagnosis not present

## 2023-11-06 DIAGNOSIS — Z9181 History of falling: Secondary | ICD-10-CM | POA: Diagnosis not present

## 2023-11-06 DIAGNOSIS — E785 Hyperlipidemia, unspecified: Secondary | ICD-10-CM | POA: Diagnosis not present

## 2023-11-06 DIAGNOSIS — W19XXXD Unspecified fall, subsequent encounter: Secondary | ICD-10-CM | POA: Diagnosis not present

## 2023-11-10 ENCOUNTER — Other Ambulatory Visit: Payer: Self-pay | Admitting: Family Medicine

## 2023-11-13 DIAGNOSIS — Z9181 History of falling: Secondary | ICD-10-CM | POA: Diagnosis not present

## 2023-11-13 DIAGNOSIS — W19XXXD Unspecified fall, subsequent encounter: Secondary | ICD-10-CM | POA: Diagnosis not present

## 2023-11-13 DIAGNOSIS — Z86711 Personal history of pulmonary embolism: Secondary | ICD-10-CM | POA: Diagnosis not present

## 2023-11-13 DIAGNOSIS — Z8673 Personal history of transient ischemic attack (TIA), and cerebral infarction without residual deficits: Secondary | ICD-10-CM | POA: Diagnosis not present

## 2023-11-13 DIAGNOSIS — N183 Chronic kidney disease, stage 3 unspecified: Secondary | ICD-10-CM | POA: Diagnosis not present

## 2023-11-13 DIAGNOSIS — M4856XD Collapsed vertebra, not elsewhere classified, lumbar region, subsequent encounter for fracture with routine healing: Secondary | ICD-10-CM | POA: Diagnosis not present

## 2023-11-13 DIAGNOSIS — Z87440 Personal history of urinary (tract) infections: Secondary | ICD-10-CM | POA: Diagnosis not present

## 2023-11-13 DIAGNOSIS — K219 Gastro-esophageal reflux disease without esophagitis: Secondary | ICD-10-CM | POA: Diagnosis not present

## 2023-11-13 DIAGNOSIS — E785 Hyperlipidemia, unspecified: Secondary | ICD-10-CM | POA: Diagnosis not present

## 2023-11-13 DIAGNOSIS — I129 Hypertensive chronic kidney disease with stage 1 through stage 4 chronic kidney disease, or unspecified chronic kidney disease: Secondary | ICD-10-CM | POA: Diagnosis not present

## 2023-11-13 DIAGNOSIS — E1140 Type 2 diabetes mellitus with diabetic neuropathy, unspecified: Secondary | ICD-10-CM | POA: Diagnosis not present

## 2023-11-13 DIAGNOSIS — Z7902 Long term (current) use of antithrombotics/antiplatelets: Secondary | ICD-10-CM | POA: Diagnosis not present

## 2023-11-13 DIAGNOSIS — E1122 Type 2 diabetes mellitus with diabetic chronic kidney disease: Secondary | ICD-10-CM | POA: Diagnosis not present

## 2023-11-20 DIAGNOSIS — K219 Gastro-esophageal reflux disease without esophagitis: Secondary | ICD-10-CM | POA: Diagnosis not present

## 2023-11-20 DIAGNOSIS — E1122 Type 2 diabetes mellitus with diabetic chronic kidney disease: Secondary | ICD-10-CM | POA: Diagnosis not present

## 2023-11-20 DIAGNOSIS — Z87440 Personal history of urinary (tract) infections: Secondary | ICD-10-CM | POA: Diagnosis not present

## 2023-11-20 DIAGNOSIS — Z86711 Personal history of pulmonary embolism: Secondary | ICD-10-CM | POA: Diagnosis not present

## 2023-11-20 DIAGNOSIS — E1140 Type 2 diabetes mellitus with diabetic neuropathy, unspecified: Secondary | ICD-10-CM | POA: Diagnosis not present

## 2023-11-20 DIAGNOSIS — M4856XD Collapsed vertebra, not elsewhere classified, lumbar region, subsequent encounter for fracture with routine healing: Secondary | ICD-10-CM | POA: Diagnosis not present

## 2023-11-20 DIAGNOSIS — Z7902 Long term (current) use of antithrombotics/antiplatelets: Secondary | ICD-10-CM | POA: Diagnosis not present

## 2023-11-20 DIAGNOSIS — Z9181 History of falling: Secondary | ICD-10-CM | POA: Diagnosis not present

## 2023-11-20 DIAGNOSIS — N183 Chronic kidney disease, stage 3 unspecified: Secondary | ICD-10-CM | POA: Diagnosis not present

## 2023-11-20 DIAGNOSIS — E785 Hyperlipidemia, unspecified: Secondary | ICD-10-CM | POA: Diagnosis not present

## 2023-11-20 DIAGNOSIS — I129 Hypertensive chronic kidney disease with stage 1 through stage 4 chronic kidney disease, or unspecified chronic kidney disease: Secondary | ICD-10-CM | POA: Diagnosis not present

## 2023-11-20 DIAGNOSIS — W19XXXD Unspecified fall, subsequent encounter: Secondary | ICD-10-CM | POA: Diagnosis not present

## 2023-11-20 DIAGNOSIS — Z8673 Personal history of transient ischemic attack (TIA), and cerebral infarction without residual deficits: Secondary | ICD-10-CM | POA: Diagnosis not present

## 2023-11-22 ENCOUNTER — Ambulatory Visit (INDEPENDENT_AMBULATORY_CARE_PROVIDER_SITE_OTHER): Admitting: Family Medicine

## 2023-11-22 ENCOUNTER — Ambulatory Visit: Payer: Self-pay | Admitting: Family Medicine

## 2023-11-22 ENCOUNTER — Encounter: Payer: Self-pay | Admitting: Family Medicine

## 2023-11-22 VITALS — BP 128/80 | HR 93 | Resp 16 | Ht 67.0 in | Wt 167.5 lb

## 2023-11-22 DIAGNOSIS — E559 Vitamin D deficiency, unspecified: Secondary | ICD-10-CM

## 2023-11-22 DIAGNOSIS — E1149 Type 2 diabetes mellitus with other diabetic neurological complication: Secondary | ICD-10-CM | POA: Diagnosis not present

## 2023-11-22 DIAGNOSIS — I5032 Chronic diastolic (congestive) heart failure: Secondary | ICD-10-CM | POA: Diagnosis not present

## 2023-11-22 DIAGNOSIS — I674 Hypertensive encephalopathy: Secondary | ICD-10-CM

## 2023-11-22 DIAGNOSIS — F3341 Major depressive disorder, recurrent, in partial remission: Secondary | ICD-10-CM

## 2023-11-22 DIAGNOSIS — N1831 Chronic kidney disease, stage 3a: Secondary | ICD-10-CM | POA: Diagnosis not present

## 2023-11-22 DIAGNOSIS — G629 Polyneuropathy, unspecified: Secondary | ICD-10-CM | POA: Diagnosis not present

## 2023-11-22 DIAGNOSIS — K219 Gastro-esophageal reflux disease without esophagitis: Secondary | ICD-10-CM

## 2023-11-22 DIAGNOSIS — R296 Repeated falls: Secondary | ICD-10-CM | POA: Diagnosis not present

## 2023-11-22 LAB — COMPREHENSIVE METABOLIC PANEL WITH GFR
ALT: 15 U/L (ref 0–35)
AST: 17 U/L (ref 0–37)
Albumin: 4 g/dL (ref 3.5–5.2)
Alkaline Phosphatase: 51 U/L (ref 39–117)
BUN: 17 mg/dL (ref 6–23)
CO2: 26 meq/L (ref 19–32)
Calcium: 9.4 mg/dL (ref 8.4–10.5)
Chloride: 106 meq/L (ref 96–112)
Creatinine, Ser: 0.99 mg/dL (ref 0.40–1.20)
GFR: 53.51 mL/min — ABNORMAL LOW (ref >60.00)
Glucose, Bld: 120 mg/dL — ABNORMAL HIGH (ref 70–99)
Potassium: 4.4 meq/L (ref 3.5–5.1)
Sodium: 143 meq/L (ref 135–145)
Total Bilirubin: 0.4 mg/dL (ref 0.2–1.2)
Total Protein: 6.9 g/dL (ref 6.0–8.3)

## 2023-11-22 LAB — VITAMIN D 25 HYDROXY (VIT D DEFICIENCY, FRACTURES): VITD: 47.18 ng/mL (ref 30.00–100.00)

## 2023-11-22 LAB — HEMOGLOBIN A1C: Hgb A1c MFr Bld: 6.6 % — ABNORMAL HIGH (ref 4.6–6.5)

## 2023-11-22 MED ORDER — PANTOPRAZOLE SODIUM 20 MG PO TBEC: 20.0000 mg | DELAYED_RELEASE_TABLET | Freq: Every day | ORAL | 2 refills | Status: AC

## 2023-11-22 NOTE — Assessment & Plan Note (Signed)
 BP adequately controlled, reporting similar readings at home. Continue Olmesartan  20 mg daily and low-salt diet.

## 2023-11-22 NOTE — Assessment & Plan Note (Signed)
 Stable. Continue duloxetine  30 mg daily and Wellbutrin  SR 100 mg twice daily.

## 2023-11-22 NOTE — Patient Instructions (Addendum)
 A few things to remember from today's visit:  DM (diabetes mellitus), type 2 with neurological complications (HCC) - Plan: Hemoglobin A1c, Microalbumin / creatinine urine ratio  Gastroesophageal reflux disease, unspecified whether esophagitis present - Plan: pantoprazole  (PROTONIX ) 20 MG tablet  Systolic hypertension with cerebrovascular disease  Vitamin D  deficiency  Stage 3a chronic kidney disease (HCC) - Plan: Comprehensive metabolic panel with GFR, Microalbumin / creatinine urine ratio  No changes today.  If you need refills for medications you take chronically, please call your pharmacy. Do not use My Chart to request refills or for acute issues that need immediate attention. If you send a my chart message, it may take a few days to be addressed, specially if I am not in the office.  Please be sure medication list is accurate. If a new problem present, please set up appointment sooner than planned today.

## 2023-11-22 NOTE — Progress Notes (Signed)
 Chief Complaint  Patient presents with   Medical Management of Chronic Issues   HPI: [name] is a 83 y.o. female,  with a PMHx significant for DM II, peripheral neuropathy, PE on chronic anticoagulation, CVA, unstable gait, HLD, HTN, GERD, and CKD III, who is here today, accompanied by her daughter, for chronic disease management.  Since last being seen on 08/09/2023, she has not presented to ED or been admitted to the hospital, has been followed by physical therapy once weekly, and on 7/16 she was seen by DPM, Delon Merlin. She has not had a fall.  Hypertension:  Medications: Olmesartan  20 mg daily BP readings at home: yes, WNL reportedly. Side effects: none mentioned  Negative for unusual or severe headache, visual changes, exertional chest pain, dyspnea, new focal weakness, or edema. BP Readings from Last 3 Encounters:  11/22/23 128/80  08/09/23 124/72  08/05/23 (!) 143/59   Diabetes Mellitus II: Dx'ed in 2016. - Medications: non-pharmacological treatment - eye exam: 03/2021 - foot exam: 01/02/2024 - Negative for symptoms of hypoglycemia, polyuria, polydipsia, foot ulcers/trauma Hx of Peripheral Neuropathy, which today she says she does still have symptoms but did not get worse after decreasing dose of Cymbalta .  Lab Results  Component Value Date   HGBA1C 6.0 07/26/2023   Hyperlipidemia: Currently on Zetia  10 mg daily and Livalo  2 mg  daily. Lab Results  Component Value Date   CHOL 152 01/02/2023   HDL 65.20 01/02/2023   LDLCALC 63 01/02/2023   TRIG 119.0 01/02/2023   CHOLHDL 2 01/02/2023   Chronic Kidney Disease, stage III: Negative for gross hematuria, foam in urine, or decreased urine output. Lab Results  Component Value Date   NA 140 07/20/2023   CL 107 07/20/2023   K 4.0 07/20/2023   CO2 26 07/20/2023   BUN 18 07/20/2023   CREATININE 1.11 (H) 07/20/2023   GFRNONAA 50 (L) 07/20/2023   CALCIUM  9.9 07/20/2023   PHOS 2.9 07/27/2020    ALBUMIN 3.9 09/05/2022   GLUCOSE 129 (H) 07/20/2023   Hx of CVA in 1994. She is on Plavix  75 mg daily.  HFpEF: Negative for orthopnea or PND. 4/30/222 Echo:  estimated LV EF is 70 to 75%. LV has hyperdynamic function, and no regional wall motion abnormalities, although there is mild left ventricular hypertrophy. LV diastolic parameters consistent with Grade I diastolic dysfunction ( impaired relaxation).  RV systolic function is mildly reduced, and size is mildly enlarged. Mitral Valve is grossly normal w/ trivial mitral valve regurgitation noted.  The aortic valve is tricuspid and regurgitation not visualized.  The inferior vena cava is normal in size with >50% respiratory variability, suggesting right atrial pressure of 3 mmHg.  Depression managed with Cymbalta  30 mg daily and Wellbutrin -SR 100 mg twice daily.  GERD managed with Pantoprazole  20 mg daily. Started with heartburn after stopping medication.  Overactive Bladder now managed with Myrbetriq 50 mg reportedly. Followed by Urology  Review of Systems  Constitutional:  Negative for activity change, appetite change and fever.  HENT:  Negative for mouth sores and sore throat.   Respiratory:  Negative for cough and wheezing.   Gastrointestinal:  Negative for abdominal pain, nausea and vomiting.  Genitourinary:  Negative for dysuria.  Musculoskeletal:  Positive for arthralgias and gait problem.  Skin:  Negative for rash.  Neurological:  Negative for syncope and facial asymmetry.  Psychiatric/Behavioral:  Negative for hallucinations.   See other pertinent positives and negatives in HPI.  Current Outpatient Medications  on File Prior to Visit  Medication Sig Dispense Refill   Accu-Chek Softclix Lancets lancets Use to check blood sugars 1-2 times daily. Dx:E11.65 200 each 12   acetaminophen  (TYLENOL ) 325 MG tablet Take 650 mg by mouth every 6 (six) hours as needed for mild pain, fever or headache.     allopurinol  (ZYLOPRIM ) 100 MG  tablet TAKE 1 TABLET BY MOUTH TWICE A DAY 180 tablet 3   Alpha-Lipoic Acid 600 MG TABS Take 600 mg by mouth daily.     b complex vitamins capsule Take 1 capsule by mouth daily.     Blood Glucose Monitoring Suppl (ACCU-CHEK GUIDE) w/Device KIT Use to check blood sugars 1-2 times daily. Dx:E11.65 1 kit 0   buPROPion  ER (WELLBUTRIN  SR) 100 MG 12 hr tablet TAKE 1 TABLET BY MOUTH TWICE A DAY 180 tablet 1   calcium  carbonate (CALCIUM  600) 600 MG TABS tablet Take 600 mg by mouth daily.     clopidogrel  (PLAVIX ) 75 MG tablet Take 1 tablet (75 mg total) by mouth daily. 90 tablet 2   DULoxetine  (CYMBALTA ) 30 MG capsule Take 1 capsule (30 mg total) by mouth daily. 90 capsule 1   ezetimibe  (ZETIA ) 10 MG tablet TAKE 1 TABLET BY MOUTH EVERY DAY 90 tablet 3   glucose blood (ACCU-CHEK GUIDE) test strip Use to test blood sugars 1-2 times daily. Dx:e11.65 200 each 12   Homeopathic Products (THERAWORX RELIEF EX) Apply 1 application topically daily as needed (foot cramps).     ketoconazole  (NIZORAL ) 2 % cream SMARTSIG:1 Application Topical 1 to 2 Times Daily     L-Lysine  500 MG TABS Take 1 tablet by mouth daily.     Lancet Devices (ACCU-CHEK SOFTCLIX) lancets Use to test blood sugar once a day. 100 each 2   LIVALO  2 MG TABS TAKE 1 TABLET BY MOUTH EVERY DAY 90 tablet 3   metroNIDAZOLE  (METROCREAM ) 0.75 % cream Apply topically 2 (two) times daily. Apply a thin layer twice a day to a clean dry face. 45 g 0   mirabegron ER (MYRBETRIQ) 50 MG TB24 tablet Take 50 mg by mouth daily.     Multiple Vitamin (MULTIVITAMIN) capsule Take 1 capsule by mouth daily.      olmesartan  (BENICAR ) 20 MG tablet TAKE 1 TABLET BY MOUTH EVERY DAY 90 tablet 2   triamcinolone  cream (KENALOG ) 0.5 % Apply 1 Application topically 3 (three) times daily. 30 g 0   No current facility-administered medications on file prior to visit.   Past Medical History:  Diagnosis Date   Arthritis Knees, fingers   Cataract I think starting   Chicken pox     Depression    Diabetes mellitus without complication (HCC)    GERD (gastroesophageal reflux disease)    Glaucoma    Hyperlipidemia    Hypertension    Neuromuscular disorder (HCC) Neuropathy   Sleep apnea Once used a CPAP   Stroke (HCC)    Thyroid  disease Would like a complete panel   UTI (urinary tract infection)    No Known Allergies  Social History   Socioeconomic History   Marital status: Widowed    Spouse name: Not on file   Number of children: Not on file   Years of education: Not on file   Highest education level: 12th grade  Occupational History   Occupation: N/A  Tobacco Use   Smoking status: Never   Smokeless tobacco: Never  Vaping Use   Vaping status: Never Used  Substance and Sexual  Activity   Alcohol use: Yes    Comment: occassional wine   Drug use: No   Sexual activity: Not Currently  Other Topics Concern   Not on file  Social History Narrative   Pt lives in single story home with her daughter   Has 2 children   Some college education   Retired Copywriter, advertising for [employer]   Social Drivers of Health   Financial Resource Strain: Low Risk  (05/11/2023)   Overall Financial Resource Strain (CARDIA)    Difficulty of Paying Living Expenses: Not hard at all  Food Insecurity: No Food Insecurity (05/11/2023)   Hunger Vital Sign    Worried About Running Out of Food in the Last Year: Never true    Ran Out of Food in the Last Year: Never true  Transportation Needs: No Transportation Needs (05/11/2023)   PRAPARE - Administrator, Civil Service (Medical): No    Lack of Transportation (Non-Medical): No  Physical Activity: Inactive (05/11/2023)   Exercise Vital Sign    Days of Exercise per Week: 0 days    Minutes of Exercise per Session: 0 min  Stress: No Stress Concern Present (05/11/2023)   [employer] of Occupational Health - Occupational Stress Questionnaire    Feeling of Stress : Not at all  Social Connections: Socially Isolated  (05/11/2023)   Social Connection and Isolation Panel    Frequency of Communication with Friends and Family: More than three times a week    Frequency of Social Gatherings with Friends and Family: More than three times a week    Attends Religious Services: Never    Database administrator or Organizations: No    Attends Banker Meetings: Never    Marital Status: Widowed   Today's Vitals   11/22/23 0818  BP: 128/80  Pulse: 93  Resp: 16  SpO2: 95%  Weight: 167 lb 8 oz (76 kg)  Height: 5' 7 (1.702 m)  Body mass index is 26.23 kg/m.  Physical Exam Vitals and nursing note reviewed.  Constitutional:      General: She is not in acute distress.    Appearance: She is well-developed.  HENT:     Head: Normocephalic and atraumatic.     Mouth/Throat:     Mouth: Mucous membranes are moist.  Eyes:     Conjunctiva/sclera: Conjunctivae normal.  Cardiovascular:     Rate and Rhythm: Normal rate and regular rhythm.     Heart sounds: No murmur heard.    Comments: DP pulses palpable. Pulmonary:     Effort: Pulmonary effort is normal. No respiratory distress.     Breath sounds: Normal breath sounds.  Abdominal:     Palpations: Abdomen is soft. There is no mass.     Tenderness: There is no abdominal tenderness.  Musculoskeletal:     Right lower leg: No edema.     Left lower leg: No edema.  Skin:    General: Skin is warm.     Findings: No erythema or rash.  Neurological:     General: No focal deficit present.     Mental Status: She is alert. Mental status is at baseline.     Comments: Oriented to year Disoriented to month (April) and day (Tuesday)  Unstable gait assisted with a walker.  Psychiatric:        Mood and Affect: Mood and affect normal.   ASSESSMENT AND PLAN: [name] was seen here today for chronic disease management.  Orders Placed This Encounter  Procedures   Comprehensive metabolic panel with GFR   Hemoglobin A1c   Microalbumin /  creatinine urine ratio   VITAMIN D  25 Hydroxy (Vit-D Deficiency, Fractures)   Lab Results  Component Value Date   NA 143 11/22/2023   CL 106 11/22/2023   K 4.4 11/22/2023   CO2 26 11/22/2023   BUN 17 11/22/2023   CREATININE 0.99 11/22/2023   GFR 53.51 (L) 11/22/2023   CALCIUM  9.4 11/22/2023   PHOS 2.9 07/27/2020   ALBUMIN 4.0 11/22/2023   GLUCOSE 120 (H) 11/22/2023   Lab Results  Component Value Date   ALT 15 11/22/2023   AST 17 11/22/2023   ALKPHOS 51 11/22/2023   BILITOT 0.4 11/22/2023   Lab Results  Component Value Date   HGBA1C 6.6 (H) 11/22/2023   Lab Results  Component Value Date   VD25OH 47.18 11/22/2023   DM (diabetes mellitus), type 2 with neurological complications [facility]) Assessment & Plan: Problem has been adequately controlled. Last hemoglobin A1c 6.0 in 06/2023. Continue nonpharmacologic treatment. Continue appropriate footcare and periodic eye exams. Further recommendation will be given according to hemoglobin A1c result.  Orders: -     Hemoglobin A1c; Future -     Microalbumin / creatinine urine ratio; Future  Gastroesophageal reflux disease, unspecified whether esophagitis present Assessment & Plan: Heartburn reoccurs after weaning off pantoprazole , so resume pantoprazole  20 mg 30 minutes before breakfast. Continue GERD precautions.  Orders: -     Pantoprazole  Sodium; Take 1 tablet (20 mg total) by mouth daily.  Dispense: 90 tablet; Refill: 2  Systolic hypertension with cerebrovascular disease Assessment & Plan: BP adequately controlled, reporting similar readings at home. Continue Olmesartan  20 mg daily and low-salt diet.     Stage 3a chronic kidney disease (HCC) Assessment & Plan: Stable. Continue adequate hydration, BP control, low-salt diet, and avoidance of NSAIDs. Continue olmesartan  20 mg daily.  Orders: -     Comprehensive metabolic panel with GFR; Future -     Microalbumin / creatinine urine ratio; Future  Chronic heart  failure with preserved ejection fraction (HCC) Assessment & Plan: Asymptomatic. Last echo in 06/2020 with LVEF 70-75% and grade I diastolic dysfunction. Currently on olmesartan  20 mg daily. Continue low-salt diet.   Frequent falls Assessment & Plan: She has not had a fall since her last visit. Continue PT,which her daughter feels like has helped tremendously. She will let me know if she needs a new referral. Fall precaution discussed.   Peripheral polyneuropathy Assessment & Plan: Symptoms are stable after decreasing duloxetine  from 60 mg to 30 mg, for now no changes. Continue appropriate skin care.   Recurrent major depressive disorder, in partial remission (HCC) Assessment & Plan: Stable. Continue duloxetine  30 mg daily and Wellbutrin  SR 100 mg twice daily.   Vitamin D  deficiency, unspecified Assessment & Plan: Currently she is not on vitamin D  supplementation. Further recommendation will be given according to 25 OH vitamin D  result.  Orders: -     VITAMIN D  25 Hydroxy (Vit-D Deficiency, Fractures); Future   Return in about 6 months (around 05/24/2024) for chronic problems.  I,Alexa Brown,acting as a Neurosurgeon for Anguel Delapena [place], MD.,have documented all relevant documentation on the behalf of Alexa Roel [place], MD,as directed by  Alexa Kashani [place], MD while in the presence of Alexa Rasberry [place], MD.  I, Otilia Kareem [place], MD, have reviewed all documentation for this visit. The documentation on 11/22/23 for the exam, diagnosis, procedures, and orders are all accurate and complete.  Kynzee Devinney [place], MD [facility]. [place] office.

## 2023-11-22 NOTE — Assessment & Plan Note (Signed)
 Problem has been adequately controlled. Last hemoglobin A1c 6.0 in 06/2023. Continue nonpharmacologic treatment. Continue appropriate footcare and periodic eye exams. Further recommendation will be given according to hemoglobin A1c result.

## 2023-11-22 NOTE — Assessment & Plan Note (Signed)
 Heartburn reoccurs after weaning off pantoprazole , so resume pantoprazole  20 mg 30 minutes before breakfast. Continue GERD precautions.

## 2023-11-22 NOTE — Assessment & Plan Note (Addendum)
 She has not had a fall since her last visit. Continue PT,which her daughter feels like has helped tremendously. She will let me know if she needs a new referral. Fall precaution discussed.

## 2023-11-22 NOTE — Assessment & Plan Note (Signed)
 Asymptomatic. Last echo in 06/2020 with LVEF 70-75% and grade I diastolic dysfunction. Currently on olmesartan  20 mg daily. Continue low-salt diet.

## 2023-11-22 NOTE — Assessment & Plan Note (Signed)
 Symptoms are stable after decreasing duloxetine  from 60 mg to 30 mg, for now no changes. Continue appropriate skin care.

## 2023-11-22 NOTE — Assessment & Plan Note (Signed)
Currently she is not on vitamin D supplementation. Further recommendation will be given according to 25 OH vitamin D result. 

## 2023-11-22 NOTE — Assessment & Plan Note (Signed)
 Stable. Continue adequate hydration, BP control, low-salt diet, and avoidance of NSAIDs. Continue olmesartan  20 mg daily.

## 2023-11-23 DIAGNOSIS — L814 Other melanin hyperpigmentation: Secondary | ICD-10-CM | POA: Diagnosis not present

## 2023-11-23 DIAGNOSIS — Z08 Encounter for follow-up examination after completed treatment for malignant neoplasm: Secondary | ICD-10-CM | POA: Diagnosis not present

## 2023-11-23 DIAGNOSIS — D225 Melanocytic nevi of trunk: Secondary | ICD-10-CM | POA: Diagnosis not present

## 2023-11-23 DIAGNOSIS — Z86007 Personal history of in-situ neoplasm of skin: Secondary | ICD-10-CM | POA: Diagnosis not present

## 2023-11-23 DIAGNOSIS — L821 Other seborrheic keratosis: Secondary | ICD-10-CM | POA: Diagnosis not present

## 2023-11-23 DIAGNOSIS — Z85828 Personal history of other malignant neoplasm of skin: Secondary | ICD-10-CM | POA: Diagnosis not present

## 2023-11-23 LAB — MICROALBUMIN / CREATININE URINE RATIO
Creatinine,U: 109.8 mg/dL
Microalb Creat Ratio: UNDETERMINED mg/g (ref 0.0–30.0)
Microalb, Ur: <0.7 mg/dL

## 2023-11-24 ENCOUNTER — Ambulatory Visit: Admitting: Family Medicine

## 2023-11-26 ENCOUNTER — Emergency Department (HOSPITAL_COMMUNITY)
Admission: EM | Admit: 2023-11-26 | Discharge: 2023-11-26 | Disposition: A | Discharge status: Home / Self Care | Attending: Emergency Medicine | Admitting: Emergency Medicine

## 2023-11-26 ENCOUNTER — Other Ambulatory Visit: Payer: Self-pay

## 2023-11-26 ENCOUNTER — Encounter (HOSPITAL_COMMUNITY): Payer: Self-pay

## 2023-11-26 ENCOUNTER — Emergency Department (HOSPITAL_COMMUNITY)

## 2023-11-26 DIAGNOSIS — R531 Weakness: Secondary | ICD-10-CM | POA: Diagnosis not present

## 2023-11-26 DIAGNOSIS — R29890 Loss of height: Secondary | ICD-10-CM | POA: Diagnosis not present

## 2023-11-26 DIAGNOSIS — S0990XA Unspecified injury of head, initial encounter: Secondary | ICD-10-CM | POA: Diagnosis not present

## 2023-11-26 DIAGNOSIS — I6789 Other cerebrovascular disease: Secondary | ICD-10-CM | POA: Insufficient documentation

## 2023-11-26 DIAGNOSIS — W1839XA Other fall on same level, initial encounter: Secondary | ICD-10-CM | POA: Insufficient documentation

## 2023-11-26 DIAGNOSIS — S3992XA Unspecified injury of lower back, initial encounter: Secondary | ICD-10-CM | POA: Diagnosis present

## 2023-11-26 DIAGNOSIS — S32030A Wedge compression fracture of third lumbar vertebra, initial encounter for closed fracture: Secondary | ICD-10-CM

## 2023-11-26 DIAGNOSIS — S32038A Other fracture of third lumbar vertebra, initial encounter for closed fracture: Secondary | ICD-10-CM | POA: Diagnosis not present

## 2023-11-26 DIAGNOSIS — G9389 Other specified disorders of brain: Secondary | ICD-10-CM | POA: Diagnosis not present

## 2023-11-26 DIAGNOSIS — M4856XA Collapsed vertebra, not elsewhere classified, lumbar region, initial encounter for fracture: Secondary | ICD-10-CM | POA: Diagnosis not present

## 2023-11-26 DIAGNOSIS — Z7401 Bed confinement status: Secondary | ICD-10-CM | POA: Diagnosis not present

## 2023-11-26 DIAGNOSIS — M549 Dorsalgia, unspecified: Secondary | ICD-10-CM | POA: Diagnosis not present

## 2023-11-26 DIAGNOSIS — I10 Essential (primary) hypertension: Secondary | ICD-10-CM | POA: Diagnosis not present

## 2023-11-26 DIAGNOSIS — E119 Type 2 diabetes mellitus without complications: Secondary | ICD-10-CM | POA: Diagnosis not present

## 2023-11-26 DIAGNOSIS — Z8673 Personal history of transient ischemic attack (TIA), and cerebral infarction without residual deficits: Secondary | ICD-10-CM | POA: Insufficient documentation

## 2023-11-26 DIAGNOSIS — R519 Headache, unspecified: Secondary | ICD-10-CM | POA: Diagnosis not present

## 2023-11-26 DIAGNOSIS — M545 Low back pain, unspecified: Secondary | ICD-10-CM | POA: Diagnosis not present

## 2023-11-26 DIAGNOSIS — Z743 Need for continuous supervision: Secondary | ICD-10-CM | POA: Diagnosis not present

## 2023-11-26 DIAGNOSIS — W19XXXA Unspecified fall, initial encounter: Secondary | ICD-10-CM | POA: Diagnosis not present

## 2023-11-26 NOTE — Discharge Instructions (Signed)
 You were evaluated in the Emergency Department and after careful evaluation, we did not find any emergent condition requiring admission or further testing in the hospital.  Your exam/testing today is overall reassuring.  Your CT shows a new compression fracture.  Recommend Tylenol  at home for pain, follow-up with your spinal expert.  Please return to the Emergency Department if you experience any worsening of your condition.   Thank you for allowing us  to be a part of your care.

## 2023-11-26 NOTE — ED Notes (Signed)
[facility] here to transport patient 

## 2023-11-26 NOTE — ED Triage Notes (Addendum)
 PT BIB EMS FROM HOME WITH C/O BACK PAIN RESULTING FROM A FALL EARLIER THIS DAY. PT IS ON BLOOD THINNERS BUT DENIES HEAD TRAUMA OR LOC

## 2023-11-26 NOTE — ED Provider Notes (Signed)
 [facility] DEPT [facility] Emergency Department Provider Note MRN:  [id number]  Arrival date & time: 11/26/23     Chief Complaint   Fall   History of Present Illness   Alexa Brown is a 83 y.o. year-old female with a history of diabetes, hypertension presenting to the ED with chief complaint of fall.  Patient thinks that she lost her balance and fell on her way to the bathroom.  Fell onto her tailbone and lower back.  Does not think she hit her head.  No neck pain, no upper back pain, no chest pain or shortness of breath, no abdominal pain, no injuries to the arms or legs.  No numbness or weakness.  Recent cement procedure to the lower back a few months ago.  Review of Systems  A thorough review of systems was obtained and all systems are negative except as noted in the HPI and PMH.   Patient's Health History    Past Medical History:  Diagnosis Date   Arthritis Knees, fingers   Cataract I think starting   Chicken pox    Depression    Diabetes mellitus without complication (HCC)    GERD (gastroesophageal reflux disease)    Glaucoma    Hyperlipidemia    Hypertension    Neuromuscular disorder (HCC) Neuropathy   Sleep apnea Once used a CPAP   Stroke (HCC)    Thyroid  disease Would like a complete panel   UTI (urinary tract infection)     Past Surgical History:  Procedure Laterality Date   ABDOMINAL HYSTERECTOMY     APPENDECTOMY     BREAST BIOPSY Left 2014   BREAST SURGERY     biopsy   TUBAL LIGATION  1990's    Family History  Problem Relation Age of Onset   Hyperlipidemia Mother    Heart disease Mother    Stroke Mother    Hypertension Mother    Hyperlipidemia Father    Heart disease Father    Stroke Father    Hypertension Father    Asthma Father    Cancer Father    Parkinson's disease Sister    Hearing loss Paternal Grandmother    ADD / ADHD Son    Learning disabilities Son    Learning disabilities Daughter    Obesity Sister     Social  History   Socioeconomic History   Marital status: Widowed    Spouse name: Not on file   Number of children: Not on file   Years of education: Not on file   Highest education level: 12th grade  Occupational History   Occupation: N/A  Tobacco Use   Smoking status: Never   Smokeless tobacco: Never  Vaping Use   Vaping status: Never Used  Substance and Sexual Activity   Alcohol use: Yes    Comment: occassional wine   Drug use: No   Sexual activity: Not Currently  Other Topics Concern   Not on file  Social History Narrative   Pt lives in single story home with her daughter   Has 2 children   Some college education   Retired Copywriter, advertising for [employer]   Social Drivers of Health   Financial Resource Strain: Low Risk  (05/11/2023)   Overall Financial Resource Strain (CARDIA)    Difficulty of Paying Living Expenses: Not hard at all  Food Insecurity: No Food Insecurity (05/11/2023)   Hunger Vital Sign    Worried About Running Out of Food in the Last Year: Never true  Ran Out of Food in the Last Year: Never true  Transportation Needs: No Transportation Needs (05/11/2023)   PRAPARE - Administrator, Civil Service (Medical): No    Lack of Transportation (Non-Medical): No  Physical Activity: Inactive (05/11/2023)   Exercise Vital Sign    Days of Exercise per Week: 0 days    Minutes of Exercise per Session: 0 min  Stress: No Stress Concern Present (05/11/2023)   [employer] of Occupational Health - Occupational Stress Questionnaire    Feeling of Stress : Not at all  Social Connections: Socially Isolated (05/11/2023)   Social Connection and Isolation Panel    Frequency of Communication with Friends and Family: More than three times a week    Frequency of Social Gatherings with Friends and Family: More than three times a week    Attends Religious Services: Never    Database administrator or Organizations: No    Attends Banker Meetings:  Never    Marital Status: Widowed  Intimate Partner Violence: Not At Risk (05/11/2023)   Humiliation, Afraid, Rape, and Kick questionnaire    Fear of Current or Ex-Partner: No    Emotionally Abused: No    Physically Abused: No    Sexually Abused: No     Physical Exam   Vitals:   11/26/23 0527 11/26/23 0530  BP: (!) 145/58 (!) 142/57  Pulse: 74 73  Resp: 18 (!) 21  Temp: 98.3 F (36.8 C)   SpO2: 96% 96%    CONSTITUTIONAL: Well-appearing, NAD NEURO/PSYCH:  Alert and oriented x 3, no focal deficits EYES:  eyes equal and reactive ENT/NECK:  no LAD, no JVD CARDIO: Regular rate, well-perfused, normal S1 and S2 PULM:  CTAB no wheezing or rhonchi GI/GU:  non-distended, non-tender MSK/SPINE:  No gross deformities, no edema SKIN:  no rash, atraumatic   *Additional and/or pertinent findings included in MDM below  Diagnostic and Interventional Summary    EKG Interpretation Date/Time:    Ventricular Rate:    PR Interval:    QRS Duration:    QT Interval:    QTC Calculation:   R Axis:      Text Interpretation:         Labs Reviewed - No data to display  CT HEAD WO CONTRAST ( )  Final Result    CT Lumbar Spine Wo Contrast  Final Result      Medications - No data to display   Procedures  /  Critical Care Procedures  ED Course and Medical Decision Making  Initial Impression and Ddx Mechanical fall, takes Plavix , lower back pain, history of prior instrumentation/procedure.  Awaiting CT imaging.  Past medical/surgical history that increases complexity of ED encounter: History of compression fracture  Interpretation of Diagnostics CT imaging showing new L3 compression fracture, mild  Patient Reassessment and Ultimate Disposition/Management     Patient continues to feel well on reassessment, very mild pain, no neurological deficits, normal and symmetric strength and sensation, normal coordination.  Appropriate for discharge.  Patient management required  discussion with the following services or consulting groups:  None  Complexity of Problems Addressed Acute illness or injury that poses threat of life of bodily function  Additional Data Reviewed and Analyzed Further history obtained from: Further history from spouse/family member  Additional Factors Impacting ED Encounter Risk None  Ozell HERO. Theadore, MD [facility] Emergency Medicine [facility] [email or web address]  Final Clinical Impressions(s) / ED Diagnoses     ICD-10-CM  1. Closed compression fracture of L3 lumbar vertebra, initial encounter [facility])  S32.030A       ED Discharge Orders     None        Discharge Instructions Discussed with and Provided to Patient:     Discharge Instructions      You were evaluated in the Emergency Department and after careful evaluation, we did not find any emergent condition requiring admission or further testing in the hospital.  Your exam/testing today is overall reassuring.  Your CT shows a new compression fracture.  Recommend Tylenol  at home for pain, follow-up with your spinal expert.  Please return to the Emergency Department if you experience any worsening of your condition.   Thank you for allowing us  to be a part of your care.       Theadore Ozell HERO, MD 11/26/23 [phone]

## 2023-11-28 ENCOUNTER — Telehealth (INDEPENDENT_AMBULATORY_CARE_PROVIDER_SITE_OTHER): Admitting: Family Medicine

## 2023-11-28 ENCOUNTER — Encounter: Payer: Self-pay | Admitting: Family Medicine

## 2023-11-28 ENCOUNTER — Ambulatory Visit: Payer: Self-pay

## 2023-11-28 VITALS — Ht 67.0 in

## 2023-11-28 DIAGNOSIS — Z9181 History of falling: Secondary | ICD-10-CM | POA: Diagnosis not present

## 2023-11-28 DIAGNOSIS — S32030G Wedge compression fracture of third lumbar vertebra, subsequent encounter for fracture with delayed healing: Secondary | ICD-10-CM

## 2023-11-28 DIAGNOSIS — W19XXXD Unspecified fall, subsequent encounter: Secondary | ICD-10-CM

## 2023-11-28 MED ORDER — HYDROCODONE-ACETAMINOPHEN 5-325 MG PO TABS: 1.0000 | ORAL_TABLET | Freq: Three times a day (TID) | ORAL | 0 refills | Status: AC | PRN

## 2023-11-28 MED ORDER — HYDROCODONE-ACETAMINOPHEN 5-325 MG PO TABS: 1.0000 | ORAL_TABLET | Freq: Three times a day (TID) | ORAL | 0 refills | Status: DC | PRN

## 2023-11-28 NOTE — Progress Notes (Signed)
 Virtual Visit via Video Note I connected with Alexa Brown on 11/28/23 by a video enabled telemedicine application and verified that I am speaking with the correct person using two identifiers. Location patient: home Location provider:work office Persons participating in the virtual visit: patient, provider,daughter.  I discussed the limitations of evaluation and management by telemedicine and the availability of in person appointments. The patient expressed understanding and agreed to proceed.  Chief Complaint  Patient presents with   Back Pain    Seen in the ED after fall, declined pain medicine at that time but would like some now due to pain.   HPI: Alexa Brown is a 83 year old female with past medical history significant for DM II, peripheral neuropathy, PE on chronic anticoagulation, CVA, unstable gait, HLD, HTN, GERD, and CKD III, being seen virtually today to follow-up on recent ED visit.  She was last seen on 11/26/23. Presented to the ED after a fall, lost balance while walking to the bathroom. No head injury or LOC. Unstable gait, uses a walker for transfer. Frequent falls, doing home PT, which has helped.  Declined pain management at the ED but pain is getting worse. Tylenol  is not helping.  Middle lower back severe pain, not radiated.  It is exacerbated by movement and alleviated by rest. Her daughter describes pain as excruciating, limiting mobility.  Negative for fever, chills, abdominal pain, nausea, vomiting, changes in bowel habits, dysuria, gross hematuria, skin rash, or MS changes.  She has seen orthopedics in the past,L2 compression fracture on 09/06/23. Her daughter has tried to arrange an appointment but has not been able to speak with somebody in the office.  Lumbar CT:  1. L3 compression fracture with 20% loss of height is new since 08/04/2023, suspicious for acute fracture in this setting. No retropulsion or complicating features. 2. No other  acute osseous abnormality in the Lumbar spine. Interval augmentation of L2 compression fracture. Mild for age Lumbar spine degeneration.  3.  Aortic Atherosclerosis (ICD10-I70.0).  Head CT:  1. No acute intracranial abnormality or acute traumatic injury identified. 2. Stable non contrast CT appearance of chronic small vessel disease.  ROS: See pertinent positives and negatives per HPI.  Past Medical History:  Diagnosis Date   Arthritis Knees, fingers   Cataract I think starting   Chicken pox    Depression    Diabetes mellitus without complication (HCC)    GERD (gastroesophageal reflux disease)    Glaucoma    Hyperlipidemia    Hypertension    Neuromuscular disorder (HCC) Neuropathy   Sleep apnea Once used a CPAP   Stroke (HCC)    Thyroid  disease Would like a complete panel   UTI (urinary tract infection)     Past Surgical History:  Procedure Laterality Date   ABDOMINAL HYSTERECTOMY     APPENDECTOMY     BREAST BIOPSY Left 2014   BREAST SURGERY     biopsy   TUBAL LIGATION  1990's    Family History  Problem Relation Age of Onset   Hyperlipidemia Mother    Heart disease Mother    Stroke Mother    Hypertension Mother    Hyperlipidemia Father    Heart disease Father    Stroke Father    Hypertension Father    Asthma Father    Cancer Father    Parkinson's disease Sister    Hearing loss Paternal Grandmother    ADD / ADHD Son    Learning disabilities Son    Learning disabilities  Daughter    Obesity Sister     Social History   Socioeconomic History   Marital status: Widowed    Spouse name: Not on file   Number of children: Not on file   Years of education: Not on file   Highest education level: 12th grade  Occupational History   Occupation: N/A  Tobacco Use   Smoking status: Never   Smokeless tobacco: Never  Vaping Use   Vaping status: Never Used  Substance and Sexual Activity   Alcohol use: Yes    Comment: occassional wine   Drug use: No   Sexual  activity: Not Currently  Other Topics Concern   Not on file  Social History Narrative   Pt lives in single story home with her daughter   Has 2 children   Some college education   Retired Copywriter, advertising for [employer]   Social Drivers of Health   Financial Resource Strain: Low Risk  (05/11/2023)   Overall Financial Resource Strain (CARDIA)    Difficulty of Paying Living Expenses: Not hard at all  Food Insecurity: No Food Insecurity (05/11/2023)   Hunger Vital Sign    Worried About Running Out of Food in the Last Year: Never true    Ran Out of Food in the Last Year: Never true  Transportation Needs: No Transportation Needs (05/11/2023)   PRAPARE - Administrator, Civil Service (Medical): No    Lack of Transportation (Non-Medical): No  Physical Activity: Inactive (05/11/2023)   Exercise Vital Sign    Days of Exercise per Week: 0 days    Minutes of Exercise per Session: 0 min  Stress: No Stress Concern Present (05/11/2023)   [employer] of Occupational Health - Occupational Stress Questionnaire    Feeling of Stress : Not at all  Social Connections: Socially Isolated (05/11/2023)   Social Connection and Isolation Panel    Frequency of Communication with Friends and Family: More than three times a week    Frequency of Social Gatherings with Friends and Family: More than three times a week    Attends Religious Services: Never    Database administrator or Organizations: No    Attends Banker Meetings: Never    Marital Status: Widowed  Intimate Partner Violence: Not At Risk (05/11/2023)   Humiliation, Afraid, Rape, and Kick questionnaire    Fear of Current or Ex-Partner: No    Emotionally Abused: No    Physically Abused: No    Sexually Abused: No     Current Outpatient Medications:    Accu-Chek Softclix Lancets lancets, Use to check blood sugars 1-2 times daily. Dx:E11.65, Disp: 200 each, Rfl: 12   acetaminophen  (TYLENOL ) 325 MG tablet, Take  650 mg by mouth every 6 (six) hours as needed for mild pain, fever or headache., Disp: , Rfl:    allopurinol  (ZYLOPRIM ) 100 MG tablet, TAKE 1 TABLET BY MOUTH TWICE A DAY, Disp: 180 tablet, Rfl: 3   Alpha-Lipoic Acid 600 MG TABS, Take 600 mg by mouth daily., Disp: , Rfl:    b complex vitamins capsule, Take 1 capsule by mouth daily., Disp: , Rfl:    Blood Glucose Monitoring Suppl (ACCU-CHEK GUIDE) w/Device KIT, Use to check blood sugars 1-2 times daily. Dx:E11.65, Disp: 1 kit, Rfl: 0   buPROPion  ER (WELLBUTRIN  SR) 100 MG 12 hr tablet, TAKE 1 TABLET BY MOUTH TWICE A DAY, Disp: 180 tablet, Rfl: 1   calcium  carbonate (CALCIUM  600) 600 MG TABS tablet,  Take 600 mg by mouth daily., Disp: , Rfl:    clopidogrel  (PLAVIX ) 75 MG tablet, Take 1 tablet (75 mg total) by mouth daily., Disp: 90 tablet, Rfl: 2   DULoxetine  (CYMBALTA ) 30 MG capsule, Take 1 capsule (30 mg total) by mouth daily., Disp: 90 capsule, Rfl: 1   ezetimibe  (ZETIA ) 10 MG tablet, TAKE 1 TABLET BY MOUTH EVERY DAY, Disp: 90 tablet, Rfl: 3   glucose blood (ACCU-CHEK GUIDE) test strip, Use to test blood sugars 1-2 times daily. Dx:e11.65, Disp: 200 each, Rfl: 12   Homeopathic Products (THERAWORX RELIEF EX), Apply 1 application topically daily as needed (foot cramps)., Disp: , Rfl:    ketoconazole  (NIZORAL ) 2 % cream, SMARTSIG:1 Application Topical 1 to 2 Times Daily, Disp: , Rfl:    L-Lysine  500 MG TABS, Take 1 tablet by mouth daily., Disp: , Rfl:    Lancet Devices (ACCU-CHEK SOFTCLIX) lancets, Use to test blood sugar once a day., Disp: 100 each, Rfl: 2   LIVALO  2 MG TABS, TAKE 1 TABLET BY MOUTH EVERY DAY, Disp: 90 tablet, Rfl: 3   metroNIDAZOLE  (METROCREAM ) 0.75 % cream, Apply topically 2 (two) times daily. Apply a thin layer twice a day to a clean dry face., Disp: 45 g, Rfl: 0   mirabegron ER (MYRBETRIQ) 50 MG TB24 tablet, Take 50 mg by mouth daily., Disp: , Rfl:    Multiple Vitamin (MULTIVITAMIN) capsule, Take 1 capsule by mouth daily. , Disp: ,  Rfl:    olmesartan  (BENICAR ) 20 MG tablet, TAKE 1 TABLET BY MOUTH EVERY DAY, Disp: 90 tablet, Rfl: 2   pantoprazole  (PROTONIX ) 20 MG tablet, Take 1 tablet (20 mg total) by mouth daily., Disp: 90 tablet, Rfl: 2   triamcinolone  cream (KENALOG ) 0.5 %, Apply 1 Application topically 3 (three) times daily., Disp: 30 g, Rfl: 0   HYDROcodone -acetaminophen  (NORCO/VICODIN) 5-325 MG tablet, Take 1 tablet by mouth every 8 (eight) hours as needed for up to 7 days for moderate pain (pain score 4-6)., Disp: 21 tablet, Rfl: 0  EXAM:  VITALS per patient if applicable:Ht 5' 7 (1.702 m)   BMI 26.23 kg/m   GENERAL: alert, at her baseline, appears well and in no acute distress.She is lying on the couch.  HEENT: atraumatic, conjunctiva clear, no obvious abnormalities on inspection of external nose and ears  NECK: normal movements of the head and neck  LUNGS: on inspection no signs of respiratory distress, breathing rate appears normal, no obvious gross SOB, gasping or wheezing  CV: no obvious cyanosis  MS: moves all visible extremities without noticeable abnormality  PSYCH/NEURO: pleasant and cooperative, no obvious depression or anxiety, speech and thought processing grossly intact  ASSESSMENT AND PLAN:  Discussed the following assessment and plan:  Closed compression fracture of L3 lumbar vertebra with delayed healing, subsequent encounter After fall on 11/26/23. Pain is severe, Tylenol  is not helping. Due to comorbilities and risk of med interaction, NSAID's are not indicated. She has taken Hydrocodone -Acetaminophen  in the past and has tolerated well. Hydrocodone -Acetaminophen  5-325 mg tid prn, stop Tylenol  while on this regimen. Side effects discussed, including risk for falls. She has seen Dr Val for L2 compression fracture, her daughter has not been able to arrange, appt, so referral placed.  -     Ambulatory referral to Orthopedics -     HYDROcodone -Acetaminophen ; Take 1 tablet by mouth  every 8 (eight) hours as needed for up to 7 days for moderate pain (pain score 4-6).  Dispense: 21 tablet; Refill: 0  Fall, subsequent encounter Hx of frequent falls. Co morbilities and some of her medications are contributing factris. Continue PT and fall precautions.  We discussed possible serious and likely etiologies, options for evaluation and workup, limitations of telemedicine visit vs in person visit, treatment, treatment risks and precautions. The patient was advised to call back or seek an in-person evaluation if the symptoms worsen or if the condition fails to improve as anticipated. I discussed the assessment and treatment plan with the patient. The patient was provided an opportunity to ask questions and all were answered. The patient agreed with the plan and demonstrated an understanding of the instructions.  Return if symptoms worsen or fail to improve, for keep next appointment.  Alexa Simerly [place], MD

## 2023-11-28 NOTE — Telephone Encounter (Addendum)
 FYI Only or Action Required?: FYI only for provider.  Patient was last seen in primary care on 11/28/2023 by [place], Clemma G, MD.  Called Nurse Triage reporting Back Pain.  Symptoms began 2 days ago.  Symptoms are: unchanged.  Triage Disposition: See HCP Within 4 Hours (Or PCP Triage)  Patient/caregiver understands and will follow disposition?: Yes       Copied from CRM [id number]. Topic: Clinical - Red Word Triage >> Nov 28, 2023 12:18 PM Alexa Brown wrote: Red Word that prompted transfer to Nurse Triage:Patients duaghter called in Alexa Brown because patient had fall on Sunday morning and currently has L3 compression fracture and is experiencing extreme pain         Reason for Disposition  [1] SEVERE back pain (e.g., excruciating, unable to do any normal activities) AND [2] not improved 2 hours after pain medicine  Answer Assessment - Initial Assessment Questions Patient's daughter reports that due the pain she does not believe the patient would be able to come in for an appointment. Video appointment scheduled to discuss pain and medication options. Patient turned down pain medication at the ED but states that she now needs something prescribed for pain.        1. ONSET: When did the pain begin? (e.g., minutes, hours, days)     2 days ago  2. LOCATION: Where does it hurt? (upper, mid or lower back)     Lower back  3. SEVERITY: How bad is the pain?  (e.g., Scale 1-10; mild, moderate, or severe)     Moderate to severe  4. PATTERN: Is the pain constant? (e.g., yes, no; constant, intermittent)      Constant  5. RADIATION: Does the pain shoot into your legs or somewhere else?     Waist and right arm  6. CAUSE:  What do you think is causing the back pain?      Fall 2 days,, seen in the ED, L3 compression fracture  7. BACK OVERUSE:  Any recent lifting of heavy objects, strenuous work or exercise?     No 8. MEDICINES: What have you taken so far for  the pain? (e.g., nothing, acetaminophen , NSAIDS)     Tylenol   9. NEUROLOGIC SYMPTOMS: Do you have any weakness, numbness, or problems with bowel/bladder control?     No 10. OTHER SYMPTOMS: Do you have any other symptoms? (e.g., fever, abdomen pain, burning with urination, blood in urine)       No  Protocols used: Back Pain-A-AH

## 2023-11-29 ENCOUNTER — Emergency Department (HOSPITAL_COMMUNITY)
Admission: EM | Admit: 2023-11-29 | Discharge: 2023-11-29 | Disposition: A | Discharge status: Home / Self Care | Attending: Emergency Medicine | Admitting: Emergency Medicine

## 2023-11-29 ENCOUNTER — Encounter (HOSPITAL_COMMUNITY): Payer: Self-pay

## 2023-11-29 ENCOUNTER — Emergency Department (HOSPITAL_COMMUNITY)

## 2023-11-29 ENCOUNTER — Other Ambulatory Visit: Payer: Self-pay

## 2023-11-29 DIAGNOSIS — Z7901 Long term (current) use of anticoagulants: Secondary | ICD-10-CM | POA: Diagnosis not present

## 2023-11-29 DIAGNOSIS — K5900 Constipation, unspecified: Secondary | ICD-10-CM | POA: Diagnosis present

## 2023-11-29 DIAGNOSIS — K5903 Drug induced constipation: Secondary | ICD-10-CM | POA: Diagnosis not present

## 2023-11-29 LAB — COMPREHENSIVE METABOLIC PANEL WITH GFR
ALT: 21 U/L (ref 0–44)
AST: 31 U/L (ref 15–41)
Albumin: 3.8 g/dL (ref 3.5–5.0)
Alkaline Phosphatase: 59 U/L (ref 38–126)
Anion gap: 12 (ref 5–15)
BUN: 16 mg/dL (ref 8–23)
CO2: 21 mmol/L — ABNORMAL LOW (ref 22–32)
Calcium: 9.7 mg/dL (ref 8.9–10.3)
Chloride: 108 mmol/L (ref 98–111)
Creatinine, Ser: 0.83 mg/dL (ref 0.44–1.00)
GFR, Estimated: >60 mL/min (ref >60)
Glucose, Bld: 124 mg/dL — ABNORMAL HIGH (ref 70–99)
Potassium: 4.3 mmol/L (ref 3.5–5.1)
Sodium: 141 mmol/L (ref 135–145)
Total Bilirubin: 0.3 mg/dL (ref 0.0–1.2)
Total Protein: 6.5 g/dL (ref 6.5–8.1)

## 2023-11-29 LAB — CBC WITH DIFFERENTIAL/PLATELET
Abs Immature Granulocytes: 0.02 K/uL (ref 0.00–0.07)
Basophils Absolute: 0 K/uL (ref 0.0–0.1)
Basophils Relative: 0 %
Eosinophils Absolute: 0.4 K/uL (ref 0.0–0.5)
Eosinophils Relative: 5 %
HCT: 38.7 % (ref 36.0–46.0)
Hemoglobin: 12.4 g/dL (ref 12.0–15.0)
Immature Granulocytes: 0 %
Lymphocytes Relative: 20 %
Lymphs Abs: 1.3 K/uL (ref 0.7–4.0)
MCH: 29.9 pg (ref 26.0–34.0)
MCHC: 32 g/dL (ref 30.0–36.0)
MCV: 93.3 fL (ref 80.0–100.0)
Monocytes Absolute: 0.8 K/uL (ref 0.1–1.0)
Monocytes Relative: 11 %
Neutro Abs: 4.3 K/uL (ref 1.7–7.7)
Neutrophils Relative %: 64 %
Platelets: 203 K/uL (ref 150–400)
RBC: 4.15 MIL/uL (ref 3.87–5.11)
RDW: 12.7 % (ref 11.5–15.5)
WBC: 6.7 K/uL (ref 4.0–10.5)
nRBC: 0 % (ref 0.0–0.2)

## 2023-11-29 MED ORDER — IOHEXOL 300 MG/ML  SOLN: 100.0000 mL | Freq: Once | INTRAMUSCULAR | Status: DC | PRN

## 2023-11-29 NOTE — ED Provider Notes (Addendum)
 [name] EMERGENCY DEPARTMENT AT [facility] Provider Note   CSN: [id number] Arrival date & time: 11/29/23  0207     Patient presents with: Constipation   Alexa Brown is a 83 y.o. female.   The history is provided by the patient and medical records.  Constipation Alexa Brown is a 83 y.o. female who presents to the Emergency Department complaining of constipation. She had a fall on Monday and was diagnosed with a compression fracture. She has been taking Tylenol  for her pain since then. She reports that she has developed right lower quadrant abdominal pain since the fall with associated rectal pain, constipation. She was able to have a very small, hard bowel movement on Monday. No associated fever, nausea, vomiting, dysuria. She also reports bilateral lower extremity weakness for the last few days. No bladder incontinence. She does have a history of PE but is no longer on eloquence due to frequent falls. She does take Plavix . She ambulates with a rollater.      Prior to Admission medications   Medication Sig Start Date End Date Taking? Authorizing Provider  acetaminophen  (TYLENOL ) 325 MG tablet Take 650 mg by mouth every 6 (six) hours as needed for mild pain, fever or headache.   Yes [provider]  allopurinol  (ZYLOPRIM ) 100 MG tablet TAKE 1 TABLET BY MOUTH TWICE A DAY 08/14/23  Yes [place], Josie G, MD  Alpha-Lipoic Acid 600 MG TABS Take 600 mg by mouth daily.   Yes [provider]  b complex vitamins capsule Take 1 capsule by mouth daily.   Yes [provider]  buPROPion  ER (WELLBUTRIN  SR) 100 MG 12 hr tablet TAKE 1 TABLET BY MOUTH TWICE A DAY 11/10/23  Yes [place], Maha G, MD  calcium  carbonate (CALCIUM  600) 600 MG TABS tablet Take 600 mg by mouth daily.   Yes [provider]  clopidogrel  (PLAVIX ) 75 MG tablet Take 1 tablet (75 mg total) by mouth daily. 07/26/23  Yes [place], Maghen G, MD  DULoxetine  (CYMBALTA )  30 MG capsule Take 1 capsule (30 mg total) by mouth daily. 07/26/23  Yes [place], Mosetta G, MD  ezetimibe  (ZETIA ) 10 MG tablet TAKE 1 TABLET BY MOUTH EVERY DAY 12/12/22  Yes [place], Aili G, MD  Homeopathic Products [facility] RELIEF Brown) Apply 1 application topically daily as needed (foot cramps).   Yes [provider]  HYDROcodone -acetaminophen  (NORCO/VICODIN) 5-325 MG tablet Take 1 tablet by mouth every 8 (eight) hours as needed for up to 7 days for moderate pain (pain score 4-6). 11/28/23 12/05/23 Yes [place], Amiyrah G, MD  L-Lysine  500 MG TABS Take 1 tablet by mouth daily.   Yes [provider]  LIVALO  2 MG TABS TAKE 1 TABLET BY MOUTH EVERY DAY 12/12/22  Yes [place], Yaileen G, MD  mirabegron ER (MYRBETRIQ) 50 MG TB24 tablet Take 50 mg by mouth daily.   Yes [provider]  Multiple Vitamin (MULTIVITAMIN) capsule Take 1 capsule by mouth daily.    Yes [provider]  olmesartan  (BENICAR ) 20 MG tablet TAKE 1 TABLET BY MOUTH EVERY DAY 10/11/23  Yes [place], Cia G, MD  pantoprazole  (PROTONIX ) 20 MG tablet Take 1 tablet (20 mg total) by mouth daily. 11/22/23  Yes [place], Shakayla G, MD  triamcinolone  cream (KENALOG ) 0.5 % Apply 1 Application topically 3 (three) times daily. Patient taking differently: Apply 1 Application topically 3 (three) times daily as needed. 05/11/23  Yes Alexa Lacks R, DO  Accu-Chek Softclix Lancets lancets  Use to check blood sugars 1-2 times daily. Dx:E11.65 10/18/21   [place], Anisah G, MD  Blood Glucose Monitoring Suppl (ACCU-CHEK GUIDE) w/Device KIT Use to check blood sugars 1-2 times daily. Dx:E11.65 10/18/21   [place], Lundon G, MD  glucose blood (ACCU-CHEK GUIDE) test strip Use to test blood sugars 1-2 times daily. Dx:e11.65 10/18/21   [place], Taraoluwa G, MD  Lancet Devices [facility]) lancets Use to test blood sugar once a day. 03/31/16   [place], Jocabed G, MD  metroNIDAZOLE  (METROCREAM ) 0.75 % cream Apply topically 2 (two) times daily. Apply a thin layer  twice a day to a clean dry face. Patient not taking: Reported on 11/29/2023 03/11/21   Alexa Clotilda SAUNDERS, MD    Allergies: Patient has no known allergies.    Review of Systems  Gastrointestinal:  Positive for constipation.  All other systems reviewed and are negative.   Updated Vital Signs BP (!) 145/77   Pulse 93   Temp 98.4 F (36.9 C) (Oral)   Resp 18   Ht 5' 7 (1.702 m)   Wt 75.3 kg   SpO2 93%   BMI 26.00 kg/m   Physical Exam Vitals and nursing note reviewed.  Constitutional:      Appearance: She is well-developed.  HENT:     Head: Normocephalic and atraumatic.  Cardiovascular:     Rate and Rhythm: Normal rate and regular rhythm.     Heart sounds: No murmur heard. Pulmonary:     Effort: Pulmonary effort is normal. No respiratory distress.     Breath sounds: Normal breath sounds.  Abdominal:     Palpations: Abdomen is soft.     Tenderness: There is no guarding or rebound.     Comments: Moderate right sided abdominal tenderness  Genitourinary:    Comments: Small amount of firm stool in the rectal vault. Normal rectal tone. Musculoskeletal:        General: No tenderness.  Skin:    General: Skin is warm and dry.  Neurological:     Mental Status: She is alert and oriented to person, place, and time.     Comments: Five out of five strengthened bilateral lower extremities with sensationally touch intact and bilateral lower extremities  Psychiatric:        Behavior: Behavior normal.     (all labs ordered are listed, but only abnormal results are displayed) Labs Reviewed  COMPREHENSIVE METABOLIC PANEL WITH GFR - Abnormal; Notable for the following components:      Result Value   CO2 21 (*)    Glucose, Bld 124 (*)    All other components within normal limits  CBC WITH DIFFERENTIAL/PLATELET  URINALYSIS, ROUTINE W REFLEX MICROSCOPIC    EKG: None  Radiology: No results found.   Procedures   Medications Ordered in the ED - No data to display                                   Medical Decision Making Amount and/or Complexity of Data Reviewed Labs: ordered.   Patient with recent compression fractures secondary to a fall here for evaluation of constipation, does have some right side abdominal pain as well. No neurologic deficits on evaluation. Her constipation was treated in the emergency department with an enema and had a large bowel movement with significant relief of her discomfort. Discussed with patient recommendation to obtain a CT scan to further evaluate her abdominal pain  and to rule out additional internal injuries given her symptoms. Patient declines to stay for CT scan. Discussed potential missed injuries and complications from these and she acknowledges these. On repeat evaluation of her abdominal pain is significantly improved. Labs are near her baseline. Discussed with patient at home care for constipation. Discussed close outpatient follow-up as well as return precautions for new or concerning symptoms.     Final diagnoses:  Drug-induced constipation    ED Discharge Orders     None          Griselda Norris, MD 11/29/23 [id number]    Griselda Norris, MD 11/29/23 [id number]

## 2023-11-29 NOTE — ED Triage Notes (Signed)
 Constipation for unknown amount of time. Pt states she had BM yesterday, but it was small. C/o abdominal pain and rectal pressure.

## 2023-12-04 DIAGNOSIS — S32030A Wedge compression fracture of third lumbar vertebra, initial encounter for closed fracture: Secondary | ICD-10-CM | POA: Diagnosis not present

## 2023-12-06 ENCOUNTER — Encounter: Payer: Self-pay | Admitting: Family Medicine

## 2023-12-06 ENCOUNTER — Other Ambulatory Visit: Payer: Self-pay | Admitting: Family Medicine

## 2023-12-06 DIAGNOSIS — G629 Polyneuropathy, unspecified: Secondary | ICD-10-CM

## 2023-12-06 DIAGNOSIS — E1169 Type 2 diabetes mellitus with other specified complication: Secondary | ICD-10-CM

## 2023-12-06 DIAGNOSIS — F3341 Major depressive disorder, recurrent, in partial remission: Secondary | ICD-10-CM

## 2023-12-08 ENCOUNTER — Other Ambulatory Visit: Payer: Self-pay | Admitting: Family Medicine

## 2023-12-08 ENCOUNTER — Telehealth: Payer: Self-pay

## 2023-12-08 DIAGNOSIS — S32030G Wedge compression fracture of third lumbar vertebra, subsequent encounter for fracture with delayed healing: Secondary | ICD-10-CM

## 2023-12-08 DIAGNOSIS — N183 Chronic kidney disease, stage 3 unspecified: Secondary | ICD-10-CM | POA: Diagnosis not present

## 2023-12-08 DIAGNOSIS — I129 Hypertensive chronic kidney disease with stage 1 through stage 4 chronic kidney disease, or unspecified chronic kidney disease: Secondary | ICD-10-CM | POA: Diagnosis not present

## 2023-12-08 DIAGNOSIS — E785 Hyperlipidemia, unspecified: Secondary | ICD-10-CM | POA: Diagnosis not present

## 2023-12-08 DIAGNOSIS — Z87440 Personal history of urinary (tract) infections: Secondary | ICD-10-CM | POA: Diagnosis not present

## 2023-12-08 DIAGNOSIS — Z8673 Personal history of transient ischemic attack (TIA), and cerebral infarction without residual deficits: Secondary | ICD-10-CM | POA: Diagnosis not present

## 2023-12-08 DIAGNOSIS — E1140 Type 2 diabetes mellitus with diabetic neuropathy, unspecified: Secondary | ICD-10-CM | POA: Diagnosis not present

## 2023-12-08 DIAGNOSIS — Z9181 History of falling: Secondary | ICD-10-CM | POA: Diagnosis not present

## 2023-12-08 DIAGNOSIS — Z86711 Personal history of pulmonary embolism: Secondary | ICD-10-CM | POA: Diagnosis not present

## 2023-12-08 DIAGNOSIS — Z7902 Long term (current) use of antithrombotics/antiplatelets: Secondary | ICD-10-CM | POA: Diagnosis not present

## 2023-12-08 DIAGNOSIS — M4856XD Collapsed vertebra, not elsewhere classified, lumbar region, subsequent encounter for fracture with routine healing: Secondary | ICD-10-CM | POA: Diagnosis not present

## 2023-12-08 DIAGNOSIS — K219 Gastro-esophageal reflux disease without esophagitis: Secondary | ICD-10-CM | POA: Diagnosis not present

## 2023-12-08 DIAGNOSIS — E1122 Type 2 diabetes mellitus with diabetic chronic kidney disease: Secondary | ICD-10-CM | POA: Diagnosis not present

## 2023-12-08 DIAGNOSIS — W19XXXD Unspecified fall, subsequent encounter: Secondary | ICD-10-CM | POA: Diagnosis not present

## 2023-12-08 MED ORDER — HYDROCODONE-ACETAMINOPHEN 5-325 MG PO TABS
1.0000 | ORAL_TABLET | Freq: Three times a day (TID) | ORAL | 0 refills | Status: AC | PRN
Start: 2023-12-08 — End: 2023-12-13

## 2023-12-08 MED ORDER — HYDROCODONE-ACETAMINOPHEN 5-325 MG PO TABS: 1.0000 | ORAL_TABLET | Freq: Three times a day (TID) | ORAL | 0 refills | Status: DC | PRN

## 2023-12-08 NOTE — Telephone Encounter (Signed)
 Copied from CRM [id number]. Topic: Clinical - Medication Question >> Dec 08, 2023  9:45 AM Burnard DEL wrote: Reason for CRM: Patients daughter Suzen called in stating that her mom will not be able to get procedure on back until Tuesday and she will run out of the hydrocodone  that she was prescribed by then.She would like to know if she could be prescribed more to get her through until then?  [id number] [place], [place] - [address]  Phone: [phone] Fax: [phone]

## 2023-12-08 NOTE — Telephone Encounter (Signed)
 Pt daughter called in again to receive an update about medication request. States that mother will be out of meds as of tomorrow morning and will not make it through the weekend. Call back [phone] to update status please

## 2023-12-08 NOTE — Telephone Encounter (Signed)
Prescription was sent to her pharmacy. Thanks, BJ

## 2023-12-12 DIAGNOSIS — M8008XA Age-related osteoporosis with current pathological fracture, vertebra(e), initial encounter for fracture: Secondary | ICD-10-CM | POA: Diagnosis not present

## 2023-12-12 DIAGNOSIS — S32030A Wedge compression fracture of third lumbar vertebra, initial encounter for closed fracture: Secondary | ICD-10-CM | POA: Diagnosis not present

## 2023-12-19 DIAGNOSIS — Z87440 Personal history of urinary (tract) infections: Secondary | ICD-10-CM | POA: Diagnosis not present

## 2023-12-19 DIAGNOSIS — E785 Hyperlipidemia, unspecified: Secondary | ICD-10-CM | POA: Diagnosis not present

## 2023-12-19 DIAGNOSIS — M4856XD Collapsed vertebra, not elsewhere classified, lumbar region, subsequent encounter for fracture with routine healing: Secondary | ICD-10-CM | POA: Diagnosis not present

## 2023-12-19 DIAGNOSIS — Z7902 Long term (current) use of antithrombotics/antiplatelets: Secondary | ICD-10-CM | POA: Diagnosis not present

## 2023-12-19 DIAGNOSIS — E1122 Type 2 diabetes mellitus with diabetic chronic kidney disease: Secondary | ICD-10-CM | POA: Diagnosis not present

## 2023-12-19 DIAGNOSIS — Z9181 History of falling: Secondary | ICD-10-CM | POA: Diagnosis not present

## 2023-12-19 DIAGNOSIS — Z8673 Personal history of transient ischemic attack (TIA), and cerebral infarction without residual deficits: Secondary | ICD-10-CM | POA: Diagnosis not present

## 2023-12-19 DIAGNOSIS — E1140 Type 2 diabetes mellitus with diabetic neuropathy, unspecified: Secondary | ICD-10-CM | POA: Diagnosis not present

## 2023-12-19 DIAGNOSIS — K219 Gastro-esophageal reflux disease without esophagitis: Secondary | ICD-10-CM | POA: Diagnosis not present

## 2023-12-19 DIAGNOSIS — N183 Chronic kidney disease, stage 3 unspecified: Secondary | ICD-10-CM | POA: Diagnosis not present

## 2023-12-19 DIAGNOSIS — W19XXXD Unspecified fall, subsequent encounter: Secondary | ICD-10-CM | POA: Diagnosis not present

## 2023-12-19 DIAGNOSIS — Z86711 Personal history of pulmonary embolism: Secondary | ICD-10-CM | POA: Diagnosis not present

## 2023-12-19 DIAGNOSIS — I129 Hypertensive chronic kidney disease with stage 1 through stage 4 chronic kidney disease, or unspecified chronic kidney disease: Secondary | ICD-10-CM | POA: Diagnosis not present

## 2023-12-20 DIAGNOSIS — K219 Gastro-esophageal reflux disease without esophagitis: Secondary | ICD-10-CM | POA: Diagnosis not present

## 2023-12-20 DIAGNOSIS — N183 Chronic kidney disease, stage 3 unspecified: Secondary | ICD-10-CM | POA: Diagnosis not present

## 2023-12-20 DIAGNOSIS — E785 Hyperlipidemia, unspecified: Secondary | ICD-10-CM | POA: Diagnosis not present

## 2023-12-20 DIAGNOSIS — Z8673 Personal history of transient ischemic attack (TIA), and cerebral infarction without residual deficits: Secondary | ICD-10-CM | POA: Diagnosis not present

## 2023-12-20 DIAGNOSIS — I129 Hypertensive chronic kidney disease with stage 1 through stage 4 chronic kidney disease, or unspecified chronic kidney disease: Secondary | ICD-10-CM | POA: Diagnosis not present

## 2023-12-20 DIAGNOSIS — Z7902 Long term (current) use of antithrombotics/antiplatelets: Secondary | ICD-10-CM | POA: Diagnosis not present

## 2023-12-20 DIAGNOSIS — Z87440 Personal history of urinary (tract) infections: Secondary | ICD-10-CM | POA: Diagnosis not present

## 2023-12-20 DIAGNOSIS — W19XXXD Unspecified fall, subsequent encounter: Secondary | ICD-10-CM | POA: Diagnosis not present

## 2023-12-20 DIAGNOSIS — Z86711 Personal history of pulmonary embolism: Secondary | ICD-10-CM | POA: Diagnosis not present

## 2023-12-20 DIAGNOSIS — E1140 Type 2 diabetes mellitus with diabetic neuropathy, unspecified: Secondary | ICD-10-CM | POA: Diagnosis not present

## 2023-12-20 DIAGNOSIS — E1122 Type 2 diabetes mellitus with diabetic chronic kidney disease: Secondary | ICD-10-CM | POA: Diagnosis not present

## 2023-12-20 DIAGNOSIS — Z9181 History of falling: Secondary | ICD-10-CM | POA: Diagnosis not present

## 2023-12-20 DIAGNOSIS — M4856XD Collapsed vertebra, not elsewhere classified, lumbar region, subsequent encounter for fracture with routine healing: Secondary | ICD-10-CM | POA: Diagnosis not present

## 2023-12-21 DIAGNOSIS — E1140 Type 2 diabetes mellitus with diabetic neuropathy, unspecified: Secondary | ICD-10-CM | POA: Diagnosis not present

## 2023-12-21 DIAGNOSIS — Z86711 Personal history of pulmonary embolism: Secondary | ICD-10-CM | POA: Diagnosis not present

## 2023-12-21 DIAGNOSIS — Z7902 Long term (current) use of antithrombotics/antiplatelets: Secondary | ICD-10-CM | POA: Diagnosis not present

## 2023-12-21 DIAGNOSIS — Z9181 History of falling: Secondary | ICD-10-CM | POA: Diagnosis not present

## 2023-12-21 DIAGNOSIS — E785 Hyperlipidemia, unspecified: Secondary | ICD-10-CM | POA: Diagnosis not present

## 2023-12-21 DIAGNOSIS — I129 Hypertensive chronic kidney disease with stage 1 through stage 4 chronic kidney disease, or unspecified chronic kidney disease: Secondary | ICD-10-CM | POA: Diagnosis not present

## 2023-12-21 DIAGNOSIS — Z8673 Personal history of transient ischemic attack (TIA), and cerebral infarction without residual deficits: Secondary | ICD-10-CM | POA: Diagnosis not present

## 2023-12-21 DIAGNOSIS — M4856XD Collapsed vertebra, not elsewhere classified, lumbar region, subsequent encounter for fracture with routine healing: Secondary | ICD-10-CM | POA: Diagnosis not present

## 2023-12-21 DIAGNOSIS — N183 Chronic kidney disease, stage 3 unspecified: Secondary | ICD-10-CM | POA: Diagnosis not present

## 2023-12-21 DIAGNOSIS — K219 Gastro-esophageal reflux disease without esophagitis: Secondary | ICD-10-CM | POA: Diagnosis not present

## 2023-12-21 DIAGNOSIS — W19XXXD Unspecified fall, subsequent encounter: Secondary | ICD-10-CM | POA: Diagnosis not present

## 2023-12-21 DIAGNOSIS — Z87440 Personal history of urinary (tract) infections: Secondary | ICD-10-CM | POA: Diagnosis not present

## 2023-12-21 DIAGNOSIS — E1122 Type 2 diabetes mellitus with diabetic chronic kidney disease: Secondary | ICD-10-CM | POA: Diagnosis not present

## 2023-12-25 ENCOUNTER — Telehealth: Payer: Self-pay

## 2023-12-25 NOTE — Telephone Encounter (Signed)
Verbal authorization can be given for requested services. Thanks, BJ 

## 2023-12-25 NOTE — Telephone Encounter (Signed)
 Copied from CRM #[id number]. Topic: Clinical - Home Health Verbal Orders >> Dec 20, 2023  3:07 PM Revonda D wrote: Caller/Agency: Ronnald with [facility]  Callback Number: [phone]([email or web address] [phone])  Service Requested: Occupational Therapy Frequency: once a week for 8 weeks  Any new concerns about the patient? No

## 2023-12-25 NOTE — Telephone Encounter (Signed)
 Verbal authorization given to party listed in documentation per Dr. Gib order.

## 2023-12-26 DIAGNOSIS — E785 Hyperlipidemia, unspecified: Secondary | ICD-10-CM | POA: Diagnosis not present

## 2023-12-26 DIAGNOSIS — Z9181 History of falling: Secondary | ICD-10-CM | POA: Diagnosis not present

## 2023-12-26 DIAGNOSIS — I129 Hypertensive chronic kidney disease with stage 1 through stage 4 chronic kidney disease, or unspecified chronic kidney disease: Secondary | ICD-10-CM | POA: Diagnosis not present

## 2023-12-26 DIAGNOSIS — Z8673 Personal history of transient ischemic attack (TIA), and cerebral infarction without residual deficits: Secondary | ICD-10-CM | POA: Diagnosis not present

## 2023-12-26 DIAGNOSIS — Z86711 Personal history of pulmonary embolism: Secondary | ICD-10-CM | POA: Diagnosis not present

## 2023-12-26 DIAGNOSIS — E1140 Type 2 diabetes mellitus with diabetic neuropathy, unspecified: Secondary | ICD-10-CM | POA: Diagnosis not present

## 2023-12-26 DIAGNOSIS — Z87440 Personal history of urinary (tract) infections: Secondary | ICD-10-CM | POA: Diagnosis not present

## 2023-12-26 DIAGNOSIS — E1122 Type 2 diabetes mellitus with diabetic chronic kidney disease: Secondary | ICD-10-CM | POA: Diagnosis not present

## 2023-12-26 DIAGNOSIS — K219 Gastro-esophageal reflux disease without esophagitis: Secondary | ICD-10-CM | POA: Diagnosis not present

## 2023-12-26 DIAGNOSIS — M4856XD Collapsed vertebra, not elsewhere classified, lumbar region, subsequent encounter for fracture with routine healing: Secondary | ICD-10-CM | POA: Diagnosis not present

## 2023-12-26 DIAGNOSIS — N183 Chronic kidney disease, stage 3 unspecified: Secondary | ICD-10-CM | POA: Diagnosis not present

## 2023-12-26 DIAGNOSIS — Z7902 Long term (current) use of antithrombotics/antiplatelets: Secondary | ICD-10-CM | POA: Diagnosis not present

## 2023-12-26 DIAGNOSIS — W19XXXD Unspecified fall, subsequent encounter: Secondary | ICD-10-CM | POA: Diagnosis not present

## 2023-12-28 DIAGNOSIS — Z8673 Personal history of transient ischemic attack (TIA), and cerebral infarction without residual deficits: Secondary | ICD-10-CM | POA: Diagnosis not present

## 2023-12-28 DIAGNOSIS — Z7902 Long term (current) use of antithrombotics/antiplatelets: Secondary | ICD-10-CM | POA: Diagnosis not present

## 2023-12-28 DIAGNOSIS — M4856XD Collapsed vertebra, not elsewhere classified, lumbar region, subsequent encounter for fracture with routine healing: Secondary | ICD-10-CM | POA: Diagnosis not present

## 2023-12-28 DIAGNOSIS — N183 Chronic kidney disease, stage 3 unspecified: Secondary | ICD-10-CM | POA: Diagnosis not present

## 2023-12-28 DIAGNOSIS — Z86711 Personal history of pulmonary embolism: Secondary | ICD-10-CM | POA: Diagnosis not present

## 2023-12-28 DIAGNOSIS — I129 Hypertensive chronic kidney disease with stage 1 through stage 4 chronic kidney disease, or unspecified chronic kidney disease: Secondary | ICD-10-CM | POA: Diagnosis not present

## 2023-12-28 DIAGNOSIS — Z87440 Personal history of urinary (tract) infections: Secondary | ICD-10-CM | POA: Diagnosis not present

## 2023-12-28 DIAGNOSIS — E785 Hyperlipidemia, unspecified: Secondary | ICD-10-CM | POA: Diagnosis not present

## 2023-12-28 DIAGNOSIS — W19XXXD Unspecified fall, subsequent encounter: Secondary | ICD-10-CM | POA: Diagnosis not present

## 2023-12-28 DIAGNOSIS — E1140 Type 2 diabetes mellitus with diabetic neuropathy, unspecified: Secondary | ICD-10-CM | POA: Diagnosis not present

## 2023-12-28 DIAGNOSIS — K219 Gastro-esophageal reflux disease without esophagitis: Secondary | ICD-10-CM | POA: Diagnosis not present

## 2023-12-28 DIAGNOSIS — Z9181 History of falling: Secondary | ICD-10-CM | POA: Diagnosis not present

## 2023-12-28 DIAGNOSIS — E1122 Type 2 diabetes mellitus with diabetic chronic kidney disease: Secondary | ICD-10-CM | POA: Diagnosis not present

## 2023-12-29 DIAGNOSIS — I129 Hypertensive chronic kidney disease with stage 1 through stage 4 chronic kidney disease, or unspecified chronic kidney disease: Secondary | ICD-10-CM | POA: Diagnosis not present

## 2023-12-29 DIAGNOSIS — Z8673 Personal history of transient ischemic attack (TIA), and cerebral infarction without residual deficits: Secondary | ICD-10-CM | POA: Diagnosis not present

## 2023-12-29 DIAGNOSIS — E1122 Type 2 diabetes mellitus with diabetic chronic kidney disease: Secondary | ICD-10-CM | POA: Diagnosis not present

## 2023-12-29 DIAGNOSIS — W19XXXD Unspecified fall, subsequent encounter: Secondary | ICD-10-CM | POA: Diagnosis not present

## 2023-12-29 DIAGNOSIS — M4856XD Collapsed vertebra, not elsewhere classified, lumbar region, subsequent encounter for fracture with routine healing: Secondary | ICD-10-CM | POA: Diagnosis not present

## 2023-12-29 DIAGNOSIS — Z86711 Personal history of pulmonary embolism: Secondary | ICD-10-CM | POA: Diagnosis not present

## 2023-12-29 DIAGNOSIS — E1140 Type 2 diabetes mellitus with diabetic neuropathy, unspecified: Secondary | ICD-10-CM | POA: Diagnosis not present

## 2023-12-29 DIAGNOSIS — K219 Gastro-esophageal reflux disease without esophagitis: Secondary | ICD-10-CM | POA: Diagnosis not present

## 2023-12-29 DIAGNOSIS — Z87440 Personal history of urinary (tract) infections: Secondary | ICD-10-CM | POA: Diagnosis not present

## 2023-12-29 DIAGNOSIS — E785 Hyperlipidemia, unspecified: Secondary | ICD-10-CM | POA: Diagnosis not present

## 2023-12-29 DIAGNOSIS — N183 Chronic kidney disease, stage 3 unspecified: Secondary | ICD-10-CM | POA: Diagnosis not present

## 2023-12-29 DIAGNOSIS — Z7902 Long term (current) use of antithrombotics/antiplatelets: Secondary | ICD-10-CM | POA: Diagnosis not present

## 2023-12-29 DIAGNOSIS — Z9181 History of falling: Secondary | ICD-10-CM | POA: Diagnosis not present

## 2024-01-01 DIAGNOSIS — Z9181 History of falling: Secondary | ICD-10-CM | POA: Diagnosis not present

## 2024-01-01 DIAGNOSIS — Z8673 Personal history of transient ischemic attack (TIA), and cerebral infarction without residual deficits: Secondary | ICD-10-CM | POA: Diagnosis not present

## 2024-01-01 DIAGNOSIS — Z86711 Personal history of pulmonary embolism: Secondary | ICD-10-CM | POA: Diagnosis not present

## 2024-01-01 DIAGNOSIS — N183 Chronic kidney disease, stage 3 unspecified: Secondary | ICD-10-CM | POA: Diagnosis not present

## 2024-01-01 DIAGNOSIS — K219 Gastro-esophageal reflux disease without esophagitis: Secondary | ICD-10-CM | POA: Diagnosis not present

## 2024-01-01 DIAGNOSIS — Z7902 Long term (current) use of antithrombotics/antiplatelets: Secondary | ICD-10-CM | POA: Diagnosis not present

## 2024-01-01 DIAGNOSIS — Z87440 Personal history of urinary (tract) infections: Secondary | ICD-10-CM | POA: Diagnosis not present

## 2024-01-01 DIAGNOSIS — W19XXXD Unspecified fall, subsequent encounter: Secondary | ICD-10-CM | POA: Diagnosis not present

## 2024-01-01 DIAGNOSIS — I129 Hypertensive chronic kidney disease with stage 1 through stage 4 chronic kidney disease, or unspecified chronic kidney disease: Secondary | ICD-10-CM | POA: Diagnosis not present

## 2024-01-01 DIAGNOSIS — E1140 Type 2 diabetes mellitus with diabetic neuropathy, unspecified: Secondary | ICD-10-CM | POA: Diagnosis not present

## 2024-01-01 DIAGNOSIS — M4856XD Collapsed vertebra, not elsewhere classified, lumbar region, subsequent encounter for fracture with routine healing: Secondary | ICD-10-CM | POA: Diagnosis not present

## 2024-01-01 DIAGNOSIS — E785 Hyperlipidemia, unspecified: Secondary | ICD-10-CM | POA: Diagnosis not present

## 2024-01-01 DIAGNOSIS — E1122 Type 2 diabetes mellitus with diabetic chronic kidney disease: Secondary | ICD-10-CM | POA: Diagnosis not present

## 2024-01-03 DIAGNOSIS — W19XXXD Unspecified fall, subsequent encounter: Secondary | ICD-10-CM | POA: Diagnosis not present

## 2024-01-03 DIAGNOSIS — K219 Gastro-esophageal reflux disease without esophagitis: Secondary | ICD-10-CM | POA: Diagnosis not present

## 2024-01-03 DIAGNOSIS — Z87440 Personal history of urinary (tract) infections: Secondary | ICD-10-CM | POA: Diagnosis not present

## 2024-01-03 DIAGNOSIS — E1122 Type 2 diabetes mellitus with diabetic chronic kidney disease: Secondary | ICD-10-CM | POA: Diagnosis not present

## 2024-01-03 DIAGNOSIS — I129 Hypertensive chronic kidney disease with stage 1 through stage 4 chronic kidney disease, or unspecified chronic kidney disease: Secondary | ICD-10-CM | POA: Diagnosis not present

## 2024-01-03 DIAGNOSIS — Z9181 History of falling: Secondary | ICD-10-CM | POA: Diagnosis not present

## 2024-01-03 DIAGNOSIS — N183 Chronic kidney disease, stage 3 unspecified: Secondary | ICD-10-CM | POA: Diagnosis not present

## 2024-01-03 DIAGNOSIS — E1140 Type 2 diabetes mellitus with diabetic neuropathy, unspecified: Secondary | ICD-10-CM | POA: Diagnosis not present

## 2024-01-03 DIAGNOSIS — Z8673 Personal history of transient ischemic attack (TIA), and cerebral infarction without residual deficits: Secondary | ICD-10-CM | POA: Diagnosis not present

## 2024-01-03 DIAGNOSIS — Z86711 Personal history of pulmonary embolism: Secondary | ICD-10-CM | POA: Diagnosis not present

## 2024-01-03 DIAGNOSIS — M4856XD Collapsed vertebra, not elsewhere classified, lumbar region, subsequent encounter for fracture with routine healing: Secondary | ICD-10-CM | POA: Diagnosis not present

## 2024-01-03 DIAGNOSIS — E785 Hyperlipidemia, unspecified: Secondary | ICD-10-CM | POA: Diagnosis not present

## 2024-01-03 DIAGNOSIS — Z7902 Long term (current) use of antithrombotics/antiplatelets: Secondary | ICD-10-CM | POA: Diagnosis not present

## 2024-01-04 DIAGNOSIS — M8008XG Age-related osteoporosis with current pathological fracture, vertebra(e), subsequent encounter for fracture with delayed healing: Secondary | ICD-10-CM | POA: Diagnosis not present

## 2024-01-05 DIAGNOSIS — N183 Chronic kidney disease, stage 3 unspecified: Secondary | ICD-10-CM | POA: Diagnosis not present

## 2024-01-05 DIAGNOSIS — E785 Hyperlipidemia, unspecified: Secondary | ICD-10-CM | POA: Diagnosis not present

## 2024-01-05 DIAGNOSIS — E1140 Type 2 diabetes mellitus with diabetic neuropathy, unspecified: Secondary | ICD-10-CM | POA: Diagnosis not present

## 2024-01-05 DIAGNOSIS — Z87440 Personal history of urinary (tract) infections: Secondary | ICD-10-CM | POA: Diagnosis not present

## 2024-01-05 DIAGNOSIS — Z9181 History of falling: Secondary | ICD-10-CM | POA: Diagnosis not present

## 2024-01-05 DIAGNOSIS — M4856XD Collapsed vertebra, not elsewhere classified, lumbar region, subsequent encounter for fracture with routine healing: Secondary | ICD-10-CM | POA: Diagnosis not present

## 2024-01-05 DIAGNOSIS — Z8673 Personal history of transient ischemic attack (TIA), and cerebral infarction without residual deficits: Secondary | ICD-10-CM | POA: Diagnosis not present

## 2024-01-05 DIAGNOSIS — Z7902 Long term (current) use of antithrombotics/antiplatelets: Secondary | ICD-10-CM | POA: Diagnosis not present

## 2024-01-05 DIAGNOSIS — K219 Gastro-esophageal reflux disease without esophagitis: Secondary | ICD-10-CM | POA: Diagnosis not present

## 2024-01-05 DIAGNOSIS — Z86711 Personal history of pulmonary embolism: Secondary | ICD-10-CM | POA: Diagnosis not present

## 2024-01-05 DIAGNOSIS — I129 Hypertensive chronic kidney disease with stage 1 through stage 4 chronic kidney disease, or unspecified chronic kidney disease: Secondary | ICD-10-CM | POA: Diagnosis not present

## 2024-01-05 DIAGNOSIS — E1122 Type 2 diabetes mellitus with diabetic chronic kidney disease: Secondary | ICD-10-CM | POA: Diagnosis not present

## 2024-01-05 DIAGNOSIS — W19XXXD Unspecified fall, subsequent encounter: Secondary | ICD-10-CM | POA: Diagnosis not present

## 2024-01-15 DIAGNOSIS — E1122 Type 2 diabetes mellitus with diabetic chronic kidney disease: Secondary | ICD-10-CM | POA: Diagnosis not present

## 2024-01-15 DIAGNOSIS — M4856XD Collapsed vertebra, not elsewhere classified, lumbar region, subsequent encounter for fracture with routine healing: Secondary | ICD-10-CM | POA: Diagnosis not present

## 2024-01-15 DIAGNOSIS — E1140 Type 2 diabetes mellitus with diabetic neuropathy, unspecified: Secondary | ICD-10-CM | POA: Diagnosis not present

## 2024-01-15 DIAGNOSIS — W19XXXD Unspecified fall, subsequent encounter: Secondary | ICD-10-CM | POA: Diagnosis not present

## 2024-01-15 DIAGNOSIS — K219 Gastro-esophageal reflux disease without esophagitis: Secondary | ICD-10-CM | POA: Diagnosis not present

## 2024-01-15 DIAGNOSIS — Z8673 Personal history of transient ischemic attack (TIA), and cerebral infarction without residual deficits: Secondary | ICD-10-CM | POA: Diagnosis not present

## 2024-01-15 DIAGNOSIS — I129 Hypertensive chronic kidney disease with stage 1 through stage 4 chronic kidney disease, or unspecified chronic kidney disease: Secondary | ICD-10-CM | POA: Diagnosis not present

## 2024-01-15 DIAGNOSIS — Z7902 Long term (current) use of antithrombotics/antiplatelets: Secondary | ICD-10-CM | POA: Diagnosis not present

## 2024-01-15 DIAGNOSIS — N183 Chronic kidney disease, stage 3 unspecified: Secondary | ICD-10-CM | POA: Diagnosis not present

## 2024-01-15 DIAGNOSIS — Z9181 History of falling: Secondary | ICD-10-CM | POA: Diagnosis not present

## 2024-01-15 DIAGNOSIS — Z87440 Personal history of urinary (tract) infections: Secondary | ICD-10-CM | POA: Diagnosis not present

## 2024-01-15 DIAGNOSIS — E785 Hyperlipidemia, unspecified: Secondary | ICD-10-CM | POA: Diagnosis not present

## 2024-01-15 DIAGNOSIS — Z86711 Personal history of pulmonary embolism: Secondary | ICD-10-CM | POA: Diagnosis not present

## 2024-01-19 DIAGNOSIS — Z8673 Personal history of transient ischemic attack (TIA), and cerebral infarction without residual deficits: Secondary | ICD-10-CM | POA: Diagnosis not present

## 2024-01-19 DIAGNOSIS — E785 Hyperlipidemia, unspecified: Secondary | ICD-10-CM | POA: Diagnosis not present

## 2024-01-19 DIAGNOSIS — M4856XD Collapsed vertebra, not elsewhere classified, lumbar region, subsequent encounter for fracture with routine healing: Secondary | ICD-10-CM | POA: Diagnosis not present

## 2024-01-19 DIAGNOSIS — I129 Hypertensive chronic kidney disease with stage 1 through stage 4 chronic kidney disease, or unspecified chronic kidney disease: Secondary | ICD-10-CM | POA: Diagnosis not present

## 2024-01-19 DIAGNOSIS — Z7902 Long term (current) use of antithrombotics/antiplatelets: Secondary | ICD-10-CM | POA: Diagnosis not present

## 2024-01-19 DIAGNOSIS — E1140 Type 2 diabetes mellitus with diabetic neuropathy, unspecified: Secondary | ICD-10-CM | POA: Diagnosis not present

## 2024-01-19 DIAGNOSIS — Z86711 Personal history of pulmonary embolism: Secondary | ICD-10-CM | POA: Diagnosis not present

## 2024-01-19 DIAGNOSIS — Z9181 History of falling: Secondary | ICD-10-CM | POA: Diagnosis not present

## 2024-01-19 DIAGNOSIS — K219 Gastro-esophageal reflux disease without esophagitis: Secondary | ICD-10-CM | POA: Diagnosis not present

## 2024-01-19 DIAGNOSIS — N183 Chronic kidney disease, stage 3 unspecified: Secondary | ICD-10-CM | POA: Diagnosis not present

## 2024-01-19 DIAGNOSIS — Z87440 Personal history of urinary (tract) infections: Secondary | ICD-10-CM | POA: Diagnosis not present

## 2024-01-19 DIAGNOSIS — E1122 Type 2 diabetes mellitus with diabetic chronic kidney disease: Secondary | ICD-10-CM | POA: Diagnosis not present

## 2024-01-19 DIAGNOSIS — W19XXXD Unspecified fall, subsequent encounter: Secondary | ICD-10-CM | POA: Diagnosis not present

## 2024-01-30 ENCOUNTER — Encounter: Payer: Self-pay | Admitting: Podiatry

## 2024-01-30 ENCOUNTER — Ambulatory Visit: Admitting: Podiatry

## 2024-01-30 DIAGNOSIS — E1142 Type 2 diabetes mellitus with diabetic polyneuropathy: Secondary | ICD-10-CM

## 2024-01-30 DIAGNOSIS — M8008XA Age-related osteoporosis with current pathological fracture, vertebra(e), initial encounter for fracture: Secondary | ICD-10-CM | POA: Insufficient documentation

## 2024-01-30 DIAGNOSIS — S32020A Wedge compression fracture of second lumbar vertebra, initial encounter for closed fracture: Secondary | ICD-10-CM | POA: Insufficient documentation

## 2024-01-30 DIAGNOSIS — L84 Corns and callosities: Secondary | ICD-10-CM

## 2024-01-30 DIAGNOSIS — S32039A Unspecified fracture of third lumbar vertebra, initial encounter for closed fracture: Secondary | ICD-10-CM | POA: Insufficient documentation

## 2024-01-30 DIAGNOSIS — S32020G Wedge compression fracture of second lumbar vertebra, subsequent encounter for fracture with delayed healing: Secondary | ICD-10-CM | POA: Insufficient documentation

## 2024-02-01 ENCOUNTER — Ambulatory Visit
Admission: RE | Admit: 2024-02-01 | Discharge: 2024-02-01 | Disposition: A | Discharge status: Home / Self Care | Source: Ambulatory Visit | Attending: Family Medicine | Admitting: Family Medicine

## 2024-02-01 ENCOUNTER — Ambulatory Visit

## 2024-02-01 DIAGNOSIS — N6310 Unspecified lump in the right breast, unspecified quadrant: Secondary | ICD-10-CM

## 2024-02-04 NOTE — Progress Notes (Signed)
  Subjective:  Patient ID: Alexa Brown, female    DOB: 03/20/1941,  MRN: [id number]  Alexa Brown presents to clinic today for at risk foot care with history of diabetic neuropathy and callus(es) right foot and painful thick toenails that are difficult to trim. Painful toenails interfere with ambulation. Aggravating factors include wearing enclosed shoe gear. Pain is relieved with periodic professional debridement. Painful calluses are aggravated when weightbearing with and without shoegear. Pain is relieved with periodic professional debridement. She is accompanied by her daughter on today's visit. Chief Complaint  Patient presents with   RFC    RFC Diabetic A1c 6.6 PCP Jordan, Mahli G, MD.  11/22/23   New problem(s): None.   PCP is Jordan, Fey G, MD.  No Known Allergies  Review of Systems: Negative except as noted in the HPI.  Objective: No changes noted in today's physical examination. There were no vitals filed for this visit. Alexa Brown is a pleasant 83 y.o. female WD, WN in NAD. AAO x 3.  Vascular Examination: Capillary refill time immediate b/l. Vascular status intact b/l with palpable pedal pulses. Pedal hair present b/l. No pain with calf compression b/l. Skin temperature gradient WNL b/l. No cyanosis or clubbing b/l. No ischemia or gangrene noted b/l. No edema noted b/l LE.  Neurological Examination: Sensation grossly intact b/l with 10 gram monofilament. Vibratory sensation intact b/l. Pt has subjective symptoms of neuropathy.  Dermatological Examination: Pedal skin with normal turgor, texture and tone b/l.  No open wounds. No interdigital macerations.   Toenails 1-5 b/l thick, discolored, elongated with subungual debris and pain on dorsal palpation.   Hyperkeratotic lesion(s) medial IPJ of right great toe.  No erythema, no edema, no drainage, no fluctuance.  Musculoskeletal Examination: Normal muscle strength 5/5 to all lower extremity  muscle groups bilaterally. HAV with bunion deformity noted b/l LE.Alexa Brown No pain, crepitus or joint limitation noted with ROM b/l LE.  Patient ambulates independently without assistive aids.  Radiographs: None  Assessment/Plan: 1. Callus   2. Diabetic peripheral neuropathy associated with type 2 diabetes mellitus (HCC)   Patient was evaluated and treated. All patient's and/or POA's questions/concerns addressed on today's visit. As a courtesy, toenails 1-5 b/l debrided in length and girth without incident. Callus(es) medial IPJ of right great toe pared with sharp debridement without incident. Continue daily foot inspections and monitor blood glucose per PCP/Endocrinologist's recommendations. Continue soft, supportive shoe gear daily. Report any pedal injuries to medical professional. Call office if there are any questions/concerns.  Return in about 3 months (around 05/01/2024).  Delon LITTIE Merlin, DPM       LOCATION: [address] [place] [address]                   Office [phone]   [facility] LOCATION: [address] [place], [place] [address] Office [phone]

## 2024-04-13 ENCOUNTER — Other Ambulatory Visit: Payer: Self-pay | Admitting: Family Medicine

## 2024-04-13 DIAGNOSIS — I63429 Cerebral infarction due to embolism of unspecified anterior cerebral artery: Secondary | ICD-10-CM

## 2024-05-14 ENCOUNTER — Ambulatory Visit: Admitting: Podiatry

## 2024-05-24 ENCOUNTER — Ambulatory Visit: Admitting: Family Medicine
